# Patient Record
Sex: Female | Born: 1945 | Race: Black or African American | Hispanic: No | Marital: Married | Smoking: Never smoker
Health system: Southern US, Community
[De-identification: ages and names within clinical notes are randomized; demographics above are authoritative.]

## PROBLEM LIST (undated history)

## (undated) DIAGNOSIS — I2510 Atherosclerotic heart disease of native coronary artery without angina pectoris: Secondary | ICD-10-CM

## (undated) DIAGNOSIS — I10 Essential (primary) hypertension: Secondary | ICD-10-CM

## (undated) DIAGNOSIS — H409 Unspecified glaucoma: Secondary | ICD-10-CM

## (undated) DIAGNOSIS — E119 Type 2 diabetes mellitus without complications: Secondary | ICD-10-CM

## (undated) DIAGNOSIS — Z8619 Personal history of other infectious and parasitic diseases: Secondary | ICD-10-CM

## (undated) HISTORY — PX: TUBAL LIGATION: SHX77

## (undated) HISTORY — PX: CATARACT EXTRACTION, BILATERAL: SHX1313

## (undated) HISTORY — DX: Personal history of other infectious and parasitic diseases: Z86.19

## (undated) HISTORY — PX: CARDIAC CATHETERIZATION: SHX172

## (undated) HISTORY — PX: COLONOSCOPY: SHX174

## (undated) HISTORY — DX: Type 2 diabetes mellitus without complications: E11.9

## (undated) HISTORY — DX: Unspecified glaucoma: H40.9

## (undated) HISTORY — DX: Essential (primary) hypertension: I10

---

## 2000-07-05 ENCOUNTER — Encounter: Admission: RE | Admit: 2000-07-05 | Discharge: 2000-07-05 | Payer: Self-pay | Admitting: Internal Medicine

## 2000-07-05 ENCOUNTER — Encounter: Payer: Self-pay | Admitting: Internal Medicine

## 2000-12-27 ENCOUNTER — Emergency Department (HOSPITAL_COMMUNITY): Admission: EM | Admit: 2000-12-27 | Discharge: 2000-12-28 | Payer: Self-pay

## 2001-07-07 ENCOUNTER — Encounter: Payer: Self-pay | Admitting: Internal Medicine

## 2001-07-07 ENCOUNTER — Encounter: Admission: RE | Admit: 2001-07-07 | Discharge: 2001-07-07 | Payer: Self-pay | Admitting: Internal Medicine

## 2001-11-01 ENCOUNTER — Emergency Department (HOSPITAL_COMMUNITY): Admission: EM | Admit: 2001-11-01 | Discharge: 2001-11-02 | Payer: Self-pay | Admitting: Emergency Medicine

## 2002-03-02 ENCOUNTER — Emergency Department (HOSPITAL_COMMUNITY): Admission: EM | Admit: 2002-03-02 | Discharge: 2002-03-02 | Payer: Self-pay | Admitting: Emergency Medicine

## 2002-05-11 ENCOUNTER — Encounter: Payer: Self-pay | Admitting: Internal Medicine

## 2002-05-11 ENCOUNTER — Encounter: Admission: RE | Admit: 2002-05-11 | Discharge: 2002-05-11 | Payer: Self-pay | Admitting: Internal Medicine

## 2003-05-21 ENCOUNTER — Encounter: Payer: Self-pay | Admitting: Internal Medicine

## 2003-05-21 ENCOUNTER — Encounter: Admission: RE | Admit: 2003-05-21 | Discharge: 2003-05-21 | Payer: Self-pay | Admitting: Internal Medicine

## 2006-08-09 ENCOUNTER — Encounter: Admission: RE | Admit: 2006-08-09 | Discharge: 2006-08-09 | Payer: Self-pay | Admitting: Internal Medicine

## 2006-08-29 ENCOUNTER — Encounter: Admission: RE | Admit: 2006-08-29 | Discharge: 2006-08-29 | Payer: Self-pay

## 2009-11-20 ENCOUNTER — Emergency Department (HOSPITAL_COMMUNITY): Admission: EM | Admit: 2009-11-20 | Discharge: 2009-11-20 | Payer: Self-pay | Admitting: Emergency Medicine

## 2010-04-14 ENCOUNTER — Encounter: Admission: RE | Admit: 2010-04-14 | Discharge: 2010-04-14 | Payer: Self-pay | Admitting: Internal Medicine

## 2010-05-17 ENCOUNTER — Encounter: Admission: RE | Admit: 2010-05-17 | Discharge: 2010-05-17 | Payer: Self-pay | Admitting: Internal Medicine

## 2010-11-12 ENCOUNTER — Encounter: Payer: Self-pay | Admitting: Internal Medicine

## 2011-10-09 ENCOUNTER — Ambulatory Visit (INDEPENDENT_AMBULATORY_CARE_PROVIDER_SITE_OTHER): Payer: BC Managed Care – PPO

## 2011-10-09 DIAGNOSIS — R05 Cough: Secondary | ICD-10-CM

## 2011-10-09 DIAGNOSIS — R059 Cough, unspecified: Secondary | ICD-10-CM

## 2011-10-09 DIAGNOSIS — J111 Influenza due to unidentified influenza virus with other respiratory manifestations: Secondary | ICD-10-CM

## 2011-10-09 DIAGNOSIS — R509 Fever, unspecified: Secondary | ICD-10-CM

## 2013-02-13 ENCOUNTER — Encounter (INDEPENDENT_AMBULATORY_CARE_PROVIDER_SITE_OTHER): Payer: BC Managed Care – PPO | Admitting: Ophthalmology

## 2013-02-13 DIAGNOSIS — H2510 Age-related nuclear cataract, unspecified eye: Secondary | ICD-10-CM

## 2013-02-13 DIAGNOSIS — H35039 Hypertensive retinopathy, unspecified eye: Secondary | ICD-10-CM

## 2013-02-13 DIAGNOSIS — E11319 Type 2 diabetes mellitus with unspecified diabetic retinopathy without macular edema: Secondary | ICD-10-CM

## 2013-02-13 DIAGNOSIS — H43819 Vitreous degeneration, unspecified eye: Secondary | ICD-10-CM

## 2013-02-13 DIAGNOSIS — E1139 Type 2 diabetes mellitus with other diabetic ophthalmic complication: Secondary | ICD-10-CM

## 2013-02-13 DIAGNOSIS — I10 Essential (primary) hypertension: Secondary | ICD-10-CM

## 2013-02-13 DIAGNOSIS — E1165 Type 2 diabetes mellitus with hyperglycemia: Secondary | ICD-10-CM

## 2013-03-30 ENCOUNTER — Ambulatory Visit (INDEPENDENT_AMBULATORY_CARE_PROVIDER_SITE_OTHER): Payer: BC Managed Care – PPO | Admitting: Family Medicine

## 2013-03-30 VITALS — BP 169/90 | HR 96 | Temp 99.0°F | Resp 16 | Ht 59.0 in | Wt 101.0 lb

## 2013-03-30 DIAGNOSIS — J209 Acute bronchitis, unspecified: Secondary | ICD-10-CM

## 2013-03-30 DIAGNOSIS — E119 Type 2 diabetes mellitus without complications: Secondary | ICD-10-CM

## 2013-03-30 DIAGNOSIS — J019 Acute sinusitis, unspecified: Secondary | ICD-10-CM

## 2013-03-30 MED ORDER — AZITHROMYCIN 250 MG PO TABS: ORAL_TABLET | ORAL | Status: DC

## 2013-03-30 MED ORDER — PREDNISONE 20 MG PO TABS: ORAL_TABLET | ORAL | Status: DC

## 2013-03-30 MED ORDER — HYDROCODONE-HOMATROPINE 5-1.5 MG/5ML PO SYRP: 5.0000 mL | ORAL_SOLUTION | Freq: Three times a day (TID) | ORAL | Status: DC | PRN

## 2013-03-30 NOTE — Patient Instructions (Addendum)
Sinusitis Sinusitis is redness, soreness, and swelling (inflammation) of the paranasal sinuses. Paranasal sinuses are air pockets within the bones of your face (beneath the eyes, the middle of the forehead, or above the eyes). In healthy paranasal sinuses, mucus is able to drain out, and air is able to circulate through them by way of your nose. However, when your paranasal sinuses are inflamed, mucus and air can become trapped. This can allow bacteria and other germs to grow and cause infection. Sinusitis can develop quickly and last only a short time (acute) or continue over a long period (chronic). Sinusitis that lasts for more than 12 weeks is considered chronic.  CAUSES  Causes of sinusitis include:  Allergies.  Structural abnormalities, such as displacement of the cartilage that separates your nostrils (deviated septum), which can decrease the air flow through your nose and sinuses and affect sinus drainage.  Functional abnormalities, such as when the small hairs (cilia) that line your sinuses and help remove mucus do not work properly or are not present. SYMPTOMS  Symptoms of acute and chronic sinusitis are the same. The primary symptoms are pain and pressure around the affected sinuses. Other symptoms include:  Upper toothache.  Earache.  Headache.  Bad breath.  Decreased sense of smell and taste.  A cough, which worsens when you are lying flat.  Fatigue.  Fever.  Thick drainage from your nose, which often is green and may contain pus (purulent).  Swelling and warmth over the affected sinuses. DIAGNOSIS  Your caregiver will perform a physical exam. During the exam, your caregiver may:  Look in your nose for signs of abnormal growths in your nostrils (nasal polyps).  Tap over the affected sinus to check for signs of infection.  View the inside of your sinuses (endoscopy) with a special imaging device with a light attached (endoscope), which is inserted into your  sinuses. If your caregiver suspects that you have chronic sinusitis, one or more of the following tests may be recommended:  Allergy tests.  Nasal culture A sample of mucus is taken from your nose and sent to a lab and screened for bacteria.  Nasal cytology A sample of mucus is taken from your nose and examined by your caregiver to determine if your sinusitis is related to an allergy. TREATMENT  Most cases of acute sinusitis are related to a viral infection and will resolve on their own within 10 days. Sometimes medicines are prescribed to help relieve symptoms (pain medicine, decongestants, nasal steroid sprays, or saline sprays).  However, for sinusitis related to a bacterial infection, your caregiver will prescribe antibiotic medicines. These are medicines that will help kill the bacteria causing the infection.  Rarely, sinusitis is caused by a fungal infection. In theses cases, your caregiver will prescribe antifungal medicine. For some cases of chronic sinusitis, surgery is needed. Generally, these are cases in which sinusitis recurs more than 3 times per year, despite other treatments. HOME CARE INSTRUCTIONS   Drink plenty of water. Water helps thin the mucus so your sinuses can drain more easily.  Use a humidifier.  Inhale steam 3 to 4 times a day (for example, sit in the bathroom with the shower running).  Apply a warm, moist washcloth to your face 3 to 4 times a day, or as directed by your caregiver.  Use saline nasal sprays to help moisten and clean your sinuses.  Take over-the-counter or prescription medicines for pain, discomfort, or fever only as directed by your caregiver. SEEK IMMEDIATE MEDICAL   or as directed by your caregiver.   Use saline nasal sprays to help moisten and clean your sinuses.   Take over-the-counter or prescription medicines for pain, discomfort, or fever only as directed by your caregiver.  SEEK IMMEDIATE MEDICAL CARE IF:   You have increasing pain or severe headaches.   You have nausea, vomiting, or drowsiness.   You have swelling around your face.   You have vision problems.   You have a stiff neck.   You have difficulty breathing.  MAKE SURE YOU:    Understand these  instructions.   Will watch your condition.   Will get help right away if you are not doing well or get worse.  Document Released: 10/08/2005 Document Revised: 12/31/2011 Document Reviewed: 10/23/2011  [employer] Patient Information 2014 [employer]  Bronchitis  Bronchitis is the body's way of reacting to injury and/or infection (inflammation) of the bronchi. Bronchi are the air tubes that extend from the windpipe into the lungs. If the inflammation becomes severe, it may cause shortness of breath.  CAUSES   Inflammation may be caused by:   A virus.   Germs (bacteria).   Dust.   Allergens.   Pollutants and many other irritants.  The cells lining the bronchial tree are covered with tiny hairs (cilia). These constantly beat upward, away from the lungs, toward the mouth. This keeps the lungs free of pollutants. When these cells become too irritated and are unable to do their job, mucus begins to develop. This causes the characteristic cough of bronchitis. The cough clears the lungs when the cilia are unable to do their job. Without either of these protective mechanisms, the mucus would settle in the lungs. Then you would develop pneumonia.  Smoking is a common cause of bronchitis and can contribute to pneumonia. Stopping this habit is the single most important thing you can do to help yourself.  TREATMENT    Your caregiver may prescribe an antibiotic if the cough is caused by bacteria. Also, medicines that open up your airways make it easier to breathe. Your caregiver may also recommend or prescribe an expectorant. It will loosen the mucus to be coughed up. Only take over-the-counter or prescription medicines for pain, discomfort, or fever as directed by your caregiver.   Removing whatever causes the problem (smoking, for example) is critical to preventing the problem from getting worse.   Cough suppressants may be prescribed for relief of cough symptoms.   Inhaled medicines may be prescribed to help with  symptoms now and to help prevent problems from returning.   For those with recurrent (chronic) bronchitis, there may be a need for steroid medicines.  SEEK IMMEDIATE MEDICAL CARE IF:    During treatment, you develop more pus-like mucus (purulent sputum).   You have a fever.   Your baby is older than 3 months with a rectal temperature of 102 F (38.9 C) or higher.   Your baby is 3 months old or younger with a rectal temperature of 100.4 F (38 C) or higher.   You become progressively more ill.   You have increased difficulty breathing, wheezing, or shortness of breath.  It is necessary to seek immediate medical care if you are elderly or sick from any other disease.  MAKE SURE YOU:    Understand these instructions.   Will watch your condition.   Will get help right away if you are not doing well or get worse.  Document Released: 10/08/2005 Document Revised: 12/31/2011 Document Reviewed: 08/17/2008

## 2013-03-30 NOTE — Progress Notes (Signed)
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    BS last checked was 140  Non smoker.  No h/o asthma  Objective:  NAD HEENT:  Unremarkable (edentulous), mucopurulent nasal discharge is present. Chest:  Diffuse bilateral wheezes Heart:  Reg, no murmur Ext:  No edema  Assessment:  Sinus infection with RAD  Plan:    Acute bronchitis - Plan: predniSONE (DELTASONE) 20 MG tablet, azithromycin (ZITHROMAX Z-PAK) 250 MG tablet, HYDROcodone-homatropine (HYCODAN) 5-1.5 MG/5ML syrup  Sinusitis, acute - Plan: predniSONE (DELTASONE) 20 MG tablet, azithromycin (ZITHROMAX Z-PAK) 250 MG tablet, HYDROcodone-homatropine (HYCODAN) 5-1.5 MG/5ML syrup  Elvina Sidle, MD

## 2013-08-04 ENCOUNTER — Ambulatory Visit: Payer: Medicare Other

## 2013-08-04 ENCOUNTER — Ambulatory Visit (INDEPENDENT_AMBULATORY_CARE_PROVIDER_SITE_OTHER): Payer: Medicare Other | Admitting: Internal Medicine

## 2013-08-04 VITALS — BP 110/62 | HR 80 | Temp 99.5°F | Resp 16 | Ht <= 58 in | Wt 104.0 lb

## 2013-08-04 DIAGNOSIS — M25511 Pain in right shoulder: Secondary | ICD-10-CM

## 2013-08-04 DIAGNOSIS — E785 Hyperlipidemia, unspecified: Secondary | ICD-10-CM

## 2013-08-04 DIAGNOSIS — E119 Type 2 diabetes mellitus without complications: Secondary | ICD-10-CM

## 2013-08-04 DIAGNOSIS — M7500 Adhesive capsulitis of unspecified shoulder: Secondary | ICD-10-CM

## 2013-08-04 DIAGNOSIS — M25519 Pain in unspecified shoulder: Secondary | ICD-10-CM

## 2013-08-04 DIAGNOSIS — M7501 Adhesive capsulitis of right shoulder: Secondary | ICD-10-CM

## 2013-08-04 MED ORDER — HYDROCODONE-ACETAMINOPHEN 10-325 MG PO TABS: 1.0000 | ORAL_TABLET | Freq: Every evening | ORAL | Status: DC | PRN

## 2013-08-04 NOTE — Progress Notes (Signed)
  Subjective:    Patient ID: Mariah Peterson, female    DOB: [birth year], 67 y.o.   MRN: [id number]  HPI A couple days started having stabbing pains in right shoulder. Pain feels deep in bone. This pain prevented her from sleeping last night. No injury. No falls. No repetitive motion. No activity out of the norm. No numbness or tingling in arm or up into neck. No pain into neck. No pain with head movement. No headache.  Blood sugars 40-150. Rarely as low as 40. No significant change with pain.  No fever.  No medication for pain. Heat with minimal relief.   Review of Systems No chest pain, no cough, no SOB. Flu shot last week at regular doctor appointment.    Objective:   Physical Exam  Constitutional: She appears well-developed and well-nourished.  Eyes: Conjunctivae are normal.  Neck: Normal range of motion. Neck supple.  Cardiovascular: Normal rate, regular rhythm and normal heart sounds.   Pulmonary/Chest: Effort normal and breath sounds normal.  Musculoskeletal: She exhibits tenderness. She exhibits no edema.  Wrist, elbow and neck with good ROM. Right radial pulse strong. Right clavicle and scapula nontender to palpation. Right shoulder painful to light touch. Generalized tenderness over entire shoulder. Patient unable to raise right arm above chest level.   Lymphadenopathy:    She has no cervical adenopathy.  [facility] reading (PRIMARY) by  Dr. Merla Peterson neg shoulder.      Assessment & Plan:  Pain in joint, shoulder region, right - Plan: DG Shoulder Right, Ambulatory referral to Physical Therapy  Frozen shoulder, right  Meds ordered this encounter  Medications  . HYDROcodone-acetaminophen (NORCO) 10-325 MG per tablet    Sig: Take 1 tablet by mouth at bedtime as needed for pain.    Dispense:  30 tablet    Refill:  0   1- Norco as above to be used at bedtime as needed. 2- Acetaminophen during the day as needed. 3- Ambulatory referral to PT 4- Heat and ROM exercises as  tolerated.  I have completed the patient encounter in its entirety as documented by the fnp dg, with editing by me where necessary. Mariah Peterson, M.D.

## 2013-08-04 NOTE — Progress Notes (Deleted)
  Subjective:    Patient ID: Mariah Peterson, female    DOB: Jun 11, 1946, 67 y.o.   MRN: [id number]  HPI    Review of Systems     Objective:   Physical Exam        Assessment & Plan:

## 2013-08-12 ENCOUNTER — Ambulatory Visit: Payer: Medicare Other | Attending: Internal Medicine | Admitting: Physical Therapy

## 2013-08-12 DIAGNOSIS — IMO0001 Reserved for inherently not codable concepts without codable children: Secondary | ICD-10-CM | POA: Insufficient documentation

## 2013-08-12 DIAGNOSIS — M25619 Stiffness of unspecified shoulder, not elsewhere classified: Secondary | ICD-10-CM | POA: Insufficient documentation

## 2013-08-12 DIAGNOSIS — M25519 Pain in unspecified shoulder: Secondary | ICD-10-CM | POA: Insufficient documentation

## 2013-08-17 ENCOUNTER — Ambulatory Visit: Payer: Medicare Other | Admitting: Rehabilitation

## 2013-08-19 ENCOUNTER — Ambulatory Visit: Payer: Medicare Other | Admitting: Physical Therapy

## 2013-08-24 ENCOUNTER — Ambulatory Visit: Payer: Medicare Other | Attending: Internal Medicine | Admitting: Physical Therapy

## 2013-08-24 DIAGNOSIS — IMO0001 Reserved for inherently not codable concepts without codable children: Secondary | ICD-10-CM | POA: Insufficient documentation

## 2013-08-24 DIAGNOSIS — M25619 Stiffness of unspecified shoulder, not elsewhere classified: Secondary | ICD-10-CM | POA: Insufficient documentation

## 2013-08-24 DIAGNOSIS — M25519 Pain in unspecified shoulder: Secondary | ICD-10-CM | POA: Insufficient documentation

## 2013-08-26 ENCOUNTER — Ambulatory Visit: Payer: Medicare Other | Admitting: Physical Therapy

## 2013-08-31 ENCOUNTER — Ambulatory Visit: Payer: Medicare Other | Admitting: Rehabilitation

## 2013-09-02 ENCOUNTER — Ambulatory Visit: Payer: Medicare Other | Admitting: Physical Therapy

## 2013-09-07 ENCOUNTER — Ambulatory Visit: Payer: Medicare Other | Admitting: Physical Therapy

## 2013-09-09 ENCOUNTER — Encounter: Payer: Medicare Other | Admitting: Physical Therapy

## 2014-02-13 ENCOUNTER — Encounter (HOSPITAL_COMMUNITY): Payer: Self-pay | Admitting: Emergency Medicine

## 2014-02-13 ENCOUNTER — Emergency Department (HOSPITAL_COMMUNITY)
Admission: EM | Admit: 2014-02-13 | Discharge: 2014-02-13 | Disposition: A | Discharge status: Home / Self Care | Payer: Medicare Other | Attending: Emergency Medicine | Admitting: Emergency Medicine

## 2014-02-13 DIAGNOSIS — M79609 Pain in unspecified limb: Secondary | ICD-10-CM

## 2014-02-13 DIAGNOSIS — E876 Hypokalemia: Secondary | ICD-10-CM

## 2014-02-13 DIAGNOSIS — Z7982 Long term (current) use of aspirin: Secondary | ICD-10-CM | POA: Insufficient documentation

## 2014-02-13 DIAGNOSIS — I10 Essential (primary) hypertension: Secondary | ICD-10-CM | POA: Insufficient documentation

## 2014-02-13 DIAGNOSIS — R252 Cramp and spasm: Secondary | ICD-10-CM | POA: Insufficient documentation

## 2014-02-13 DIAGNOSIS — H409 Unspecified glaucoma: Secondary | ICD-10-CM | POA: Insufficient documentation

## 2014-02-13 DIAGNOSIS — Z79899 Other long term (current) drug therapy: Secondary | ICD-10-CM | POA: Insufficient documentation

## 2014-02-13 DIAGNOSIS — Z794 Long term (current) use of insulin: Secondary | ICD-10-CM | POA: Insufficient documentation

## 2014-02-13 DIAGNOSIS — R739 Hyperglycemia, unspecified: Secondary | ICD-10-CM

## 2014-02-13 DIAGNOSIS — E119 Type 2 diabetes mellitus without complications: Secondary | ICD-10-CM | POA: Insufficient documentation

## 2014-02-13 LAB — URINALYSIS, ROUTINE W REFLEX MICROSCOPIC
Bilirubin Urine: NEGATIVE
HGB URINE DIPSTICK: NEGATIVE
KETONES UR: NEGATIVE mg/dL
Nitrite: NEGATIVE
PROTEIN: NEGATIVE mg/dL
Specific Gravity, Urine: 1.031 — ABNORMAL HIGH (ref 1.005–1.030)
Urobilinogen, UA: 0.2 mg/dL (ref 0.0–1.0)
pH: 5.5 (ref 5.0–8.0)

## 2014-02-13 LAB — URINE MICROSCOPIC-ADD ON

## 2014-02-13 LAB — BASIC METABOLIC PANEL
BUN: 34 mg/dL — AB (ref 6–23)
CALCIUM: 10.8 mg/dL — AB (ref 8.4–10.5)
CO2: 26 meq/L (ref 19–32)
CREATININE: 1.01 mg/dL (ref 0.50–1.10)
Chloride: 96 mEq/L (ref 96–112)
GFR calc Af Amer: 66 mL/min — ABNORMAL LOW (ref >90)
GFR calc non Af Amer: 57 mL/min — ABNORMAL LOW (ref >90)
GLUCOSE: 338 mg/dL — AB (ref 70–99)
Potassium: 3.6 mEq/L — ABNORMAL LOW (ref 3.7–5.3)
Sodium: 138 mEq/L (ref 137–147)

## 2014-02-13 LAB — CBC WITH DIFFERENTIAL/PLATELET
BASOS ABS: 0 10*3/uL (ref 0.0–0.1)
Basophils Relative: 0 % (ref 0–1)
EOS PCT: 1 % (ref 0–5)
Eosinophils Absolute: 0.1 10*3/uL (ref 0.0–0.7)
HEMATOCRIT: 38.7 % (ref 36.0–46.0)
Hemoglobin: 13.7 g/dL (ref 12.0–15.0)
LYMPHS ABS: 2.6 10*3/uL (ref 0.7–4.0)
LYMPHS PCT: 34 % (ref 12–46)
MCH: 30.3 pg (ref 26.0–34.0)
MCHC: 35.4 g/dL (ref 30.0–36.0)
MCV: 85.6 fL (ref 78.0–100.0)
MONO ABS: 0.6 10*3/uL (ref 0.1–1.0)
Monocytes Relative: 8 % (ref 3–12)
Neutro Abs: 4.2 10*3/uL (ref 1.7–7.7)
Neutrophils Relative %: 57 % (ref 43–77)
Platelets: 220 10*3/uL (ref 150–400)
RBC: 4.52 MIL/uL (ref 3.87–5.11)
RDW: 12.2 % (ref 11.5–15.5)
WBC: 7.5 10*3/uL (ref 4.0–10.5)

## 2014-02-13 LAB — CBG MONITORING, ED: Glucose-Capillary: 171 mg/dL — ABNORMAL HIGH (ref 70–99)

## 2014-02-13 MED ORDER — SODIUM CHLORIDE 0.9 % IV SOLN
1000.0000 mL | Freq: Once | INTRAVENOUS | Status: AC
  Administered 2014-02-13: 1000 mL via INTRAVENOUS

## 2014-02-13 MED ORDER — MORPHINE SULFATE 4 MG/ML IJ SOLN
4.0000 mg | Freq: Once | INTRAMUSCULAR | Status: AC
  Administered 2014-02-13: 4 mg via INTRAVENOUS
  Filled 2014-02-13: qty 1

## 2014-02-13 MED ORDER — SODIUM CHLORIDE 0.9 % IV SOLN: 1000.0000 mL | INTRAVENOUS | Status: DC

## 2014-02-13 MED ORDER — POTASSIUM CHLORIDE 10 MEQ/100ML IV SOLN
10.0000 meq | Freq: Once | INTRAVENOUS | Status: AC
  Administered 2014-02-13: 10 meq via INTRAVENOUS
  Filled 2014-02-13: qty 100

## 2014-02-13 NOTE — Progress Notes (Signed)
VASCULAR LAB PRELIMINARY  PRELIMINARY  PRELIMINARY  PRELIMINARY  Bilateral lower extremity venous Dopplers completed.    Preliminary report:  There is no obvious evidence of DVT or SVT noted in the bilateral lower extremities.  There is sluggish flow noted, however, etiology unknown.  Iantha Fallen, RVT 02/13/2014, 8:24 AM

## 2014-02-13 NOTE — ED Provider Notes (Signed)
CSN: [id number]     Arrival date & time 02/13/14  0223 History   First MD Initiated Contact with Patient 02/13/14 [phone]     Chief Complaint  Patient presents with  . Leg Pain     (Consider location/radiation/quality/duration/timing/severity/associated sxs/prior Treatment) HPI Comments: Pt is a 68 y/o female with a PMHx of HTN, glaucoma and DM who presents to the ED complaining of sudden onset bilateral lower leg pain beginning 4 hours PTA when she went to lay down to sleep. Pain begins around her knees and radiates down into her feet described as a constant cramp, worse while walking. States she had a similar episode 3 weeks ago but not as severe. States she feels her toes curling in and cramping at times. Denies ever having symptoms like this before 3 weeks ago. Denies numbness or tingling. No known injury or trauma. Denies swelling. She is diabetic and reports her blood sugars controlled, it is noted her blood pressure was 338 today but she states she had some soda. She has not tried any alleviating factors for her symptoms. Denies history of blood clots. Denies cp, sob, n/v.  Patient is a 68 y.o. female presenting with leg pain. The history is provided by the patient.  Leg Pain   Past Medical History  Diagnosis Date  . Diabetes mellitus without complication   . Glaucoma   . Hypertension    Past Surgical History  Procedure Laterality Date  . Cesarean section    . Eye surgery     No family history on file. History  Substance Use Topics  . Smoking status: Never Smoker   . Smokeless tobacco: Not on file  . Alcohol Use: No   OB History   Grav Para Term Preterm Abortions TAB SAB Ect Mult Living                 Review of Systems  Musculoskeletal:       Positive for bilateral leg pain and cramping.  All other systems reviewed and are negative.     Allergies  Review of patient's allergies indicates no known allergies.  Home Medications   Prior to Admission medications    Medication Sig Start Date End Date Taking? Authorizing Provider  amLODipine (NORVASC) 2.5 MG tablet Take 2.5 mg by mouth daily.   Yes Historical Provider, MD  aspirin EC 81 MG tablet Take 81 mg by mouth daily.   Yes Historical Provider, MD  chlorthalidone (HYGROTON) 25 MG tablet Take 25 mg by mouth daily.   Yes Historical Provider, MD  glipiZIDE (GLUCOTROL) 10 MG tablet Take 10 mg by mouth 2 (two) times daily before a meal.   Yes Historical Provider, MD  insulin aspart (NOVOLOG) 100 UNIT/ML injection Inject 20 Units into the skin 3 (three) times daily before meals.   Yes Historical Provider, MD  insulin glargine (LANTUS) 100 UNIT/ML injection Inject 36 Units into the skin 2 (two) times daily.   Yes Historical Provider, MD  lisinopril (PRINIVIL,ZESTRIL) 40 MG tablet Take 40 mg by mouth daily.   Yes Historical Provider, MD  metFORMIN (GLUCOPHAGE) 500 MG tablet Take 500 mg by mouth 2 (two) times daily with a meal.   Yes Historical Provider, MD  simvastatin (ZOCOR) 80 MG tablet Take 80 mg by mouth daily.   Yes Historical Provider, MD  Travoprost, BAK Free, (TRAVATAN) 0.004 % SOLN ophthalmic solution Place 1 drop into both eyes at bedtime.    Yes Historical Provider, MD   BP 127/65  Pulse 74  Temp(Src) 98.3 F (36.8 C) (Oral)  Resp 16  Ht 4\' 11"  (1.499 m)  Wt 98 lb 8 oz (44.679 kg)  BMI 19.88 kg/m2  SpO2 97% Physical Exam  Nursing note and vitals reviewed. Constitutional: She is oriented to person, place, and time. She appears well-developed and well-nourished. No distress.  HENT:  Head: Normocephalic and atraumatic.  Mouth/Throat: Oropharynx is clear and moist.  Eyes: Conjunctivae are normal.  Neck: Normal range of motion. Neck supple.  Cardiovascular: Normal rate, regular rhythm and normal heart sounds.   +2 PT/DP pulses BL.  Pulmonary/Chest: Effort normal and breath sounds normal.  Abdominal: Soft. Bowel sounds are normal. There is no tenderness.  Musculoskeletal: Normal range of  motion. She exhibits no edema.  TTP bilateral lower legs diffusely. No erythema, edema or warmth.  Neurological: She is alert and oriented to person, place, and time.  No sensory deficit. Slow gait, painful to walk.  Skin: Skin is warm and dry. She is not diaphoretic. No erythema.  Psychiatric: She has a normal mood and affect. Her behavior is normal.    ED Course  Procedures (including critical care time) Labs Review Labs Reviewed  BASIC METABOLIC PANEL - Abnormal; Notable for the following:    Potassium 3.6 (*)    Glucose, Bld 338 (*)    BUN 34 (*)    Calcium 10.8 (*)    GFR calc non Af Amer 57 (*)    GFR calc Af Amer 66 (*)    All other components within normal limits  URINALYSIS, ROUTINE W REFLEX MICROSCOPIC - Abnormal; Notable for the following:    Specific Gravity, Urine 1.031 (*)    Glucose, UA >1000 (*)    Leukocytes, UA SMALL (*)    All other components within normal limits  CBG MONITORING, ED - Abnormal; Notable for the following:    Glucose-Capillary 171 (*)    All other components within normal limits  CBC WITH DIFFERENTIAL  URINE MICROSCOPIC-ADD ON    Imaging Review No results found. VASCULAR LAB  PRELIMINARY PRELIMINARY PRELIMINARY PRELIMINARY  Bilateral lower extremity venous Dopplers completed.  Preliminary report: There is no obvious evidence of DVT or SVT noted in the bilateral lower extremities. There is sluggish flow noted, however, etiology unknown.  Iantha Fallen, RVT  02/13/2014, 8:24 AM   EKG Interpretation None      MDM   Final diagnoses:  Hypokalemia  Hyperglycemia  Leg cramps   Pt presenting with bilateral lower leg pain and cramping. Neurovascularly intact. Labs obtained prior to pt being seen. BS elevated today at 338. Ca slightly elevated at 10.8, and hypokalemia 3.6 which could relate to cramping sensation. Less likely diabetic neuropathy. Plan to give IV fluids and potassium. Will obtain bilateral LE venous duplex to r/o  DVT. 8:47 AM Lower extremity venous duplex negative for DVT. After receiving IV fluids, glucose 171. Patient reports her symptoms have resolved. Her legs bilaterally are nontender, states the cramping sensation has subsided.stable for d/c. I advised her to f/u with PCP and keep close checks on her glucose. Stable for d/c.   Case discussed with attending Dr. Roxanne Mins who also evaluated patient and agrees with plan of care.   Illene Labrador, PA-C 02/13/14 [phone]

## 2014-02-13 NOTE — ED Notes (Signed)
Pt reports she has been experiencing bilateral calf and foot cramps that "curl my feet back" x 2 hours.  When she was asked about her blood sugar being elevated, pt stated "oh, I know why that is, I had some sodas."  Denies any other complaints.

## 2014-02-13 NOTE — ED Provider Notes (Signed)
68 year old female who is diabetic has had bilateral leg pain for about the last week which is getting worse. At times, her legs cramp to the point where her toes wall. Pain is significantly worse last night. There is no history of trauma. There is no numbness or tingling or burning. On exam, she does have tenderness rather diffusely through legs with no swelling or erythema or warmth. Dorsalis pedis pulses are 2+ and strong. Capillary refill is prompt. There is no numbness and no evidence of neuropathy. She states that her blood sugars have been running fairly normal at home but blood sugar is elevated over 300 today. She is also noted to have borderline hypokalemia and elevated BUN relative to creatinine. She will be given IV hydration and IV potassium and she will also need to be sent for venous Doppler to rule out DVT.  Medical screening examination/treatment/procedure(s) were conducted as a shared visit with non-physician practitioner(s) and myself.  I personally evaluated the patient during the encounter.   Delora Fuel, MD 45/[phone]

## 2014-02-13 NOTE — Discharge Instructions (Signed)
Hyperglycemia °Hyperglycemia occurs when the glucose (sugar) in your blood is too high. Hyperglycemia can happen for many reasons, but it most often happens to people who do not know they have diabetes or are not managing their diabetes properly.  °CAUSES  °Whether you have diabetes or not, there are other causes of hyperglycemia. Hyperglycemia can occur when you have diabetes, but it can also occur in other situations that you might not be as aware of, such as: °Diabetes °· If you have diabetes and are having problems controlling your blood glucose, hyperglycemia could occur because of some of the following reasons: °· Not following your meal plan. °· Not taking your diabetes medications or not taking it properly. °· Exercising less or doing less activity than you normally do. °· Being sick. °Pre-diabetes °· This cannot be ignored. Before people develop Type 2 diabetes, they almost always have "pre-diabetes." This is when your blood glucose levels are higher than normal, but not yet high enough to be diagnosed as diabetes. Research has shown that some long-term damage to the body, especially the heart and circulatory system, may already be occurring during pre-diabetes. If you take action to manage your blood glucose when you have pre-diabetes, you may delay or prevent Type 2 diabetes from developing. °Stress °· If you have diabetes, you may be "diet" controlled or on oral medications or insulin to control your diabetes. However, you may find that your blood glucose is higher than usual in the hospital whether you have diabetes or not. This is often referred to as "stress hyperglycemia." Stress can elevate your blood glucose. This happens because of hormones put out by the body during times of stress. If stress has been the cause of your high blood glucose, it can be followed regularly by your caregiver. That way he/she can make sure your hyperglycemia does not continue to get worse or progress to  diabetes. °Steroids °· Steroids are medications that act on the infection fighting system (immune system) to block inflammation or infection. One side effect can be a rise in blood glucose. Most people can produce enough extra insulin to allow for this rise, but for those who cannot, steroids make blood glucose levels go even higher. It is not unusual for steroid treatments to "uncover" diabetes that is developing. It is not always possible to determine if the hyperglycemia will go away after the steroids are stopped. A special blood test called an A1c is sometimes done to determine if your blood glucose was elevated before the steroids were started. °SYMPTOMS °· Thirsty. °· Frequent urination. °· Dry mouth. °· Blurred vision. °· Tired or fatigue. °· Weakness. °· Sleepy. °· Tingling in feet or leg. °DIAGNOSIS  °Diagnosis is made by monitoring blood glucose in one or all of the following ways: °· A1c test. This is a chemical found in your blood. °· Fingerstick blood glucose monitoring. °· Laboratory results. °TREATMENT  °First, knowing the cause of the hyperglycemia is important before the hyperglycemia can be treated. Treatment may include, but is not be limited to: °· Education. °· Change or adjustment in medications. °· Change or adjustment in meal plan. °· Treatment for an illness, infection, etc. °· More frequent blood glucose monitoring. °· Change in exercise plan. °· Decreasing or stopping steroids. °· Lifestyle changes. °HOME CARE INSTRUCTIONS  °· Test your blood glucose as directed. °· Exercise regularly. Your caregiver will give you instructions about exercise. Pre-diabetes or diabetes which comes on with stress is helped by exercising. °· Eat wholesome,   balanced meals. Eat often and at regular, fixed times. Your caregiver or nutritionist will give you a meal plan to guide your sugar intake.  Being at an ideal weight is important. If needed, losing as little as 10 to 15 pounds may help improve blood  glucose levels. SEEK MEDICAL CARE IF:   You have questions about medicine, activity, or diet.  You continue to have symptoms (problems such as increased thirst, urination, or weight gain). SEEK IMMEDIATE MEDICAL CARE IF:   You are vomiting or have diarrhea.  Your breath smells fruity.  You are breathing faster or slower.  You are very sleepy or incoherent.  You have numbness, tingling, or pain in your feet or hands.  You have chest pain.  Your symptoms get worse even though you have been following your caregiver's orders.  If you have any other questions or concerns. Document Released: 04/03/2001 Document Revised: 12/31/2011 Document Reviewed: 02/04/2012 [facility] Patient Information 2014 [place], [place].  Muscle Cramps and Spasms Muscle cramps and spasms occur when a muscle or muscles tighten and you have no control over this tightening (involuntary muscle contraction). They are a common problem and can develop in any muscle. The most common place is in the calf muscles of the leg. Both muscle cramps and muscle spasms are involuntary muscle contractions, but they also have differences:   Muscle cramps are sporadic and painful. They may last a few seconds to a quarter of an hour. Muscle cramps are often more forceful and last longer than muscle spasms.  Muscle spasms may or may not be painful. They may also last just a few seconds or much longer. CAUSES  It is uncommon for cramps or spasms to be due to a serious underlying problem. In many cases, the cause of cramps or spasms is unknown. Some common causes are:   Overexertion.   Overuse from repetitive motions (doing the same thing over and over).   Remaining in a certain position for a long period of time.   Improper preparation, form, or technique while performing a sport or activity.   Dehydration.   Injury.   Side effects of some medicines.   Abnormally low levels of the salts and ions in your blood  (electrolytes), especially potassium and calcium. This could happen if you are taking water pills (diuretics) or you are pregnant.  Some underlying medical problems can make it more likely to develop cramps or spasms. These include, but are not limited to:   Diabetes.   Parkinson disease.   Hormone disorders, such as thyroid problems.   Alcohol abuse.   Diseases specific to muscles, joints, and bones.   Blood vessel disease where not enough blood is getting to the muscles.  HOME CARE INSTRUCTIONS   Stay well hydrated. Drink enough water and fluids to keep your urine clear or pale yellow.  It may be helpful to massage, stretch, and relax the affected muscle.  For tight or tense muscles, use a warm towel, heating pad, or hot shower water directed to the affected area.  If you are sore or have pain after a cramp or spasm, applying ice to the affected area may relieve discomfort.  Put ice in a plastic bag.  Place a towel between your skin and the bag.  Leave the ice on for 15-20 minutes, 03-04 times a day.  Medicines used to treat a known cause of cramps or spasms may help reduce their frequency or severity. Only take over-the-counter or prescription medicines as directed  by your caregiver. SEEK MEDICAL CARE IF:  Your cramps or spasms get more severe, more frequent, or do not improve over time.  MAKE SURE YOU:   Understand these instructions.  Will watch your condition.  Will get help right away if you are not doing well or get worse. Document Released: 03/30/2002 Document Revised: 02/02/2013 Document Reviewed: 09/24/2012 [facility] Patient Information 2014 [place], [place].  Hypokalemia Hypokalemia means that the amount of potassium in the blood is lower than normal.Potassium is a chemical, called an electrolyte, that helps regulate the amount of fluid in the body. It also stimulates muscle contraction and helps nerves function properly.Most of the body's potassium is  inside of cells, and only a very small amount is in the blood. Because the amount in the blood is so small, minor changes can be life-threatening. CAUSES  Antibiotics.  Diarrhea or vomiting.  Using laxatives too much, which can cause diarrhea.  Chronic kidney disease.  Water pills (diuretics).  Eating disorders (bulimia).  Low magnesium level.  Sweating a lot. SIGNS AND SYMPTOMS  Weakness.  Constipation.  Fatigue.  Muscle cramps.  Mental confusion.  Skipped heartbeats or irregular heartbeat (palpitations).  Tingling or numbness. DIAGNOSIS  Your health care provider can diagnose hypokalemia with blood tests. In addition to checking your potassium level, your health care provider may also check other lab tests. TREATMENT Hypokalemia can be treated with potassium supplements taken by mouth or adjustments in your current medicines. If your potassium level is very low, you may need to get potassium through a vein (IV) and be monitored in the hospital. A diet high in potassium is also helpful. Foods high in potassium are:  Nuts, such as peanuts and pistachios.  Seeds, such as sunflower seeds and pumpkin seeds.  Peas, lentils, and lima beans.  Whole grain and bran cereals and breads.  Fresh fruit and vegetables, such as apricots, avocado, bananas, cantaloupe, kiwi, oranges, tomatoes, asparagus, and potatoes.  Orange and tomato juices.  Red meats.  Fruit yogurt. HOME CARE INSTRUCTIONS  Take all medicines as prescribed by your health care provider.  Maintain a healthy diet by including nutritious food, such as fruits, vegetables, nuts, whole grains, and lean meats.  If you are taking a laxative, be sure to follow the directions on the label. SEEK MEDICAL CARE IF:  Your weakness gets worse.  You feel your heart pounding or racing.  You are vomiting or having diarrhea.  You are diabetic and having trouble keeping your blood glucose in the normal range. SEEK  IMMEDIATE MEDICAL CARE IF:  You have chest pain, shortness of breath, or dizziness.  You are vomiting or having diarrhea for more than 2 days.  You faint. MAKE SURE YOU:   Understand these instructions.  Will watch your condition.  Will get help right away if you are not doing well or get worse. Document Released: 10/08/2005 Document Revised: 07/29/2013 Document Reviewed: 04/10/2013 [facility] Patient Information 2014 [employer].

## 2014-02-13 NOTE — ED Notes (Signed)
The pt is c/p bi-lateral leg pain from the knees down to her feet for 3-4 hours.  The pain started when she lay down to sleep.  No previous history

## 2014-02-24 ENCOUNTER — Ambulatory Visit (INDEPENDENT_AMBULATORY_CARE_PROVIDER_SITE_OTHER): Payer: Medicare Other | Admitting: Ophthalmology

## 2014-02-24 DIAGNOSIS — H43819 Vitreous degeneration, unspecified eye: Secondary | ICD-10-CM

## 2014-02-24 DIAGNOSIS — H35039 Hypertensive retinopathy, unspecified eye: Secondary | ICD-10-CM

## 2014-02-24 DIAGNOSIS — H11009 Unspecified pterygium of unspecified eye: Secondary | ICD-10-CM

## 2014-02-24 DIAGNOSIS — H2510 Age-related nuclear cataract, unspecified eye: Secondary | ICD-10-CM

## 2014-02-24 DIAGNOSIS — I10 Essential (primary) hypertension: Secondary | ICD-10-CM

## 2014-02-24 DIAGNOSIS — E11319 Type 2 diabetes mellitus with unspecified diabetic retinopathy without macular edema: Secondary | ICD-10-CM

## 2014-02-24 DIAGNOSIS — E1165 Type 2 diabetes mellitus with hyperglycemia: Secondary | ICD-10-CM

## 2014-02-24 DIAGNOSIS — E1139 Type 2 diabetes mellitus with other diabetic ophthalmic complication: Secondary | ICD-10-CM

## 2014-03-22 ENCOUNTER — Ambulatory Visit (INDEPENDENT_AMBULATORY_CARE_PROVIDER_SITE_OTHER): Payer: Medicare Other | Admitting: Emergency Medicine

## 2014-03-22 VITALS — BP 130/80 | HR 71 | Temp 98.3°F | Resp 16 | Ht <= 58 in | Wt 93.4 lb

## 2014-03-22 DIAGNOSIS — E785 Hyperlipidemia, unspecified: Secondary | ICD-10-CM

## 2014-03-22 DIAGNOSIS — E119 Type 2 diabetes mellitus without complications: Secondary | ICD-10-CM

## 2014-03-22 DIAGNOSIS — N898 Other specified noninflammatory disorders of vagina: Secondary | ICD-10-CM

## 2014-03-22 DIAGNOSIS — I10 Essential (primary) hypertension: Secondary | ICD-10-CM

## 2014-03-22 DIAGNOSIS — N3000 Acute cystitis without hematuria: Secondary | ICD-10-CM

## 2014-03-22 LAB — POCT WET PREP WITH KOH
Clue Cells Wet Prep HPF POC: NEGATIVE
KOH Prep POC: NEGATIVE
Trichomonas, UA: NEGATIVE
Yeast Wet Prep HPF POC: NEGATIVE

## 2014-03-22 LAB — POCT URINALYSIS DIPSTICK
BILIRUBIN UA: NEGATIVE
Blood, UA: NEGATIVE
Glucose, UA: >=1000
KETONES UA: NEGATIVE
NITRITE UA: NEGATIVE
PROTEIN UA: NEGATIVE
Spec Grav, UA: 1.015
Urobilinogen, UA: 0.2
pH, UA: 5

## 2014-03-22 LAB — POCT UA - MICROSCOPIC ONLY
CRYSTALS, UR, HPF, POC: NEGATIVE
Casts, Ur, LPF, POC: NEGATIVE
Mucus, UA: NEGATIVE
YEAST UA: NEGATIVE

## 2014-03-22 LAB — GLUCOSE, POCT (MANUAL RESULT ENTRY): POC GLUCOSE: 261 mg/dL — AB (ref 70–99)

## 2014-03-22 MED ORDER — NITROFURANTOIN MONOHYD MACRO 100 MG PO CAPS: 100.0000 mg | ORAL_CAPSULE | Freq: Two times a day (BID) | ORAL | Status: DC

## 2014-03-22 NOTE — Patient Instructions (Signed)
Blood Glucose Monitoring, Adult  Monitoring your blood glucose (also know as blood sugar) helps you to manage your diabetes. It also helps you and your health care provider monitor your diabetes and determine how well your treatment plan is working.  WHY SHOULD YOU MONITOR YOUR BLOOD GLUCOSE?  · It can help you understand how food, exercise, and medicine affect your blood glucose.  · It allows you to know what your blood glucose is at any given moment. You can quickly tell if you are having low blood glucose (hypoglycemia) or high blood glucose (hyperglycemia).  · It can help you and your health care provider know how to adjust your medicines.  · It can help you understand how to manage an illness or adjust medicine for exercise.  WHEN SHOULD YOU TEST?  Your health care provider will help you decide how often you should check your blood glucose. This may depend on the type of diabetes you have, your diabetes control, or the types of medicines you are taking. Be sure to write down all of your blood glucose readings so that this information can be reviewed with your health care provider. See below for examples of testing times that your health care provider may suggest.  Type 1 Diabetes  · Test 4 times a day if you are in good control, using an insulin pump, or perform multiple daily injections.  · If your diabetes is not well-controlled or if you are sick, you may need to monitor more often.  · It is a good idea to also monitor:  · Before and after exercise.  · Between meals and 2 hours after a meal.  · Occasionally between 2:00 to 3:00 am.  Type 2 Diabetes  · It can vary with each person, but generally, if you are on insulin, test 4 times a day.  · If you take medicines by mouth (orally), test 2 times a day.  · If you are on a controlled diet, test once a day.  · If your diabetes is not well controlled or if you are sick, you may need to monitor more often.  HOW TO MONITOR YOUR BLOOD GLUCOSE  Supplies  Needed  · Blood glucose meter.  · Test strips for your meter. Each meter has its own strips. You must use the strips that go with your own meter.  · A pricking needle (lancet).  · A device that holds the lancet (lancing device).  · A journal or log book to write down your results.  Procedure  · Wash your hands with soap and water. Alcohol is not preferred.  · Prick the side of your finger (not the tip) with the lancet.  · Gently milk the finger until a small drop of blood appears.  · Follow the instructions that come with your meter for inserting the test strip, applying blood to the strip, and using your blood glucose meter.  Other Areas to Get Blood for Testing  Some meters allow you to use other areas of your body (other than your finger) to test your blood. These areas are called alternative sites. The most common alternative sites are:  · The forearm.  · The thigh.  · The back area of the lower leg.  · The palm of the hand.  The blood flow in these areas is slower. Therefore, the blood glucose values you get may be delayed, and the numbers are different from what you would get from your fingers. Do not use alternative   sites if you think you are having hypoglycemia. Your reading will not be accurate. Always use a finger if you are having hypoglycemia. Also, if you cannot feel your lows (hypoglycemia unawareness), always use your fingers for your blood glucose checks.  ADDITIONAL TIPS FOR GLUCOSE MONITORING  · Do not reuse lancets.  · Always carry your supplies with you.  · All blood glucose meters have a 24-hour "hotline" number to call if you have questions or need help.  · Adjust (calibrate) your blood glucose meter with a control solution after finishing a few boxes of strips.  BLOOD GLUCOSE RECORD KEEPING  It is a good idea to keep a daily record or log of your blood glucose readings. Most glucose meters, if not all, keep your glucose records stored in the meter. Some meters come with the ability to download  your records to your home computer. Keeping a record of your blood glucose readings is especially helpful if you are wanting to look for patterns. Make notes to go along with the blood glucose readings because you might forget what happened at that exact time. Keeping good records helps you and your health care provider to work together to achieve good diabetes management.   Document Released: 10/11/2003 Document Revised: 06/10/2013 Document Reviewed: 03/02/2013  [employer]® Patient Information ©2014 [employer].

## 2014-03-22 NOTE — Progress Notes (Signed)
[facility] [address] [place] [address] [phone]  Date:  03/22/2014   Name:  Mariah Peterson   DOB:  [birth year]   MRN:  [id number]  PCP:  Myriam Jacobson, MD    Chief Complaint: Dysuria and Vaginal Itching   History of Present Illness:  Mariah Peterson is a 68 y.o. very pleasant female patient who presents with the following:  Has dysuria and frequency.  No vaginal discharge, fever or chills.  Not very compliant with diet and glucose monitoring.  No nausea or vomiting.    Patient Active Problem List   Diagnosis Date Noted  . Other and unspecified hyperlipidemia 08/04/2013  . Type II or unspecified type diabetes mellitus without mention of complication, not stated as uncontrolled 08/04/2013    Past Medical History  Diagnosis Date  . Diabetes mellitus without complication   . Glaucoma   . Hypertension     Past Surgical History  Procedure Laterality Date  . Cesarean section    . Eye surgery      History  Substance Use Topics  . Smoking status: Never Smoker   . Smokeless tobacco: Not on file  . Alcohol Use: No    History reviewed. No pertinent family history.  No Known Allergies  Medication list has been reviewed and updated.  Current Outpatient Prescriptions on File Prior to Visit  Medication Sig Dispense Refill  . amLODipine (NORVASC) 2.5 MG tablet Take 2.5 mg by mouth daily.      Marland Kitchen aspirin EC 81 MG tablet Take 81 mg by mouth daily.      . chlorthalidone (HYGROTON) 25 MG tablet Take 25 mg by mouth daily.      Marland Kitchen glipiZIDE (GLUCOTROL) 10 MG tablet Take 10 mg by mouth 2 (two) times daily before a meal.      . insulin aspart (NOVOLOG) 100 UNIT/ML injection Inject 20 Units into the skin 3 (three) times daily before meals.      . insulin glargine (LANTUS) 100 UNIT/ML injection Inject 36 Units into the skin 2 (two) times daily.      Marland Kitchen lisinopril (PRINIVIL,ZESTRIL) 40 MG tablet Take 40 mg by mouth daily.      . metFORMIN (GLUCOPHAGE)  500 MG tablet Take 500 mg by mouth 2 (two) times daily with a meal.      . simvastatin (ZOCOR) 80 MG tablet Take 80 mg by mouth daily.      . Travoprost, BAK Free, (TRAVATAN) 0.004 % SOLN ophthalmic solution Place 1 drop into both eyes at bedtime.        No current facility-administered medications on file prior to visit.    Review of Systems: As per HPI, otherwise negative.    Physical Examination: Filed Vitals:   03/22/14 1012  BP: 130/80  Pulse: 71  Temp: 98.3 F (36.8 C)  Resp: 16   Filed Vitals:   03/22/14 1012  Height: 4\' 8"  (1.422 m)  Weight: 93 lb 6.4 oz (42.366 kg)   Body mass index is 20.95 kg/(m^2). Ideal Body Weight: Weight in (lb) to have BMI = 25: 111.3  GEN: WDWN, NAD, Non-toxic, A & O x 3 HEENT: Atraumatic, Normocephalic. Neck supple. No masses, No LAD. Ears and Nose: No external deformity. CV: RRR, No M/G/R. No JVD. No thrill. No extra heart sounds. PULM: CTA B, no wheezes, crackles, rhonchi. No retractions. No resp. distress. No accessory muscle use. ABD: S, NT, ND, +BS. No rebound. No HSM. EXTR:  No c/c/e NEURO Normal gait.  PSYCH: Normally interactive. Conversant. Not depressed or anxious appearing.  Calm demeanor.    Assessment and Plan: NIDDM noncompliance Cystitis  Signed,  Ellison Carwin, MD   Results for orders placed in visit on 03/22/14  POCT UA - MICROSCOPIC ONLY      Result Value Ref Range   WBC, Ur, HPF, POC 12-18     RBC, urine, microscopic 0-3     Bacteria, U Microscopic trace     Mucus, UA neg     Epithelial cells, urine per micros 1-5     Crystals, Ur, HPF, POC neg     Casts, Ur, LPF, POC neg     Yeast, UA neg    POCT URINALYSIS DIPSTICK      Result Value Ref Range   Color, UA yellow     Clarity, UA clear     Glucose, UA >=1000     Bilirubin, UA neg     Ketones, UA neg     Spec Grav, UA 1.015     Blood, UA neg     pH, UA 5.0     Protein, UA neg     Urobilinogen, UA 0.2     Nitrite, UA neg     Leukocytes, UA  Trace    POCT WET PREP WITH KOH      Result Value Ref Range   Trichomonas, UA Negative     Clue Cells Wet Prep HPF POC neg     Epithelial Wet Prep HPF POC 0-8     Yeast Wet Prep HPF POC neg     Bacteria Wet Prep HPF POC 1+     RBC Wet Prep HPF POC 0-4     WBC Wet Prep HPF POC 0-2     KOH Prep POC Negative

## 2015-03-14 ENCOUNTER — Ambulatory Visit (INDEPENDENT_AMBULATORY_CARE_PROVIDER_SITE_OTHER): Payer: Medicare Other | Admitting: Ophthalmology

## 2015-03-14 DIAGNOSIS — H43813 Vitreous degeneration, bilateral: Secondary | ICD-10-CM

## 2015-03-14 DIAGNOSIS — E11329 Type 2 diabetes mellitus with mild nonproliferative diabetic retinopathy without macular edema: Secondary | ICD-10-CM | POA: Diagnosis not present

## 2015-03-14 DIAGNOSIS — E11319 Type 2 diabetes mellitus with unspecified diabetic retinopathy without macular edema: Secondary | ICD-10-CM

## 2015-03-14 DIAGNOSIS — H35033 Hypertensive retinopathy, bilateral: Secondary | ICD-10-CM

## 2015-03-14 DIAGNOSIS — I10 Essential (primary) hypertension: Secondary | ICD-10-CM

## 2015-05-01 ENCOUNTER — Encounter (HOSPITAL_COMMUNITY): Payer: Self-pay | Admitting: *Deleted

## 2015-05-01 ENCOUNTER — Emergency Department (INDEPENDENT_AMBULATORY_CARE_PROVIDER_SITE_OTHER)
Admission: EM | Admit: 2015-05-01 | Discharge: 2015-05-01 | Disposition: A | Discharge status: Home / Self Care | Payer: Medicare Other | Source: Home / Self Care | Attending: Emergency Medicine | Admitting: Emergency Medicine

## 2015-05-01 DIAGNOSIS — T63441A Toxic effect of venom of bees, accidental (unintentional), initial encounter: Secondary | ICD-10-CM | POA: Diagnosis not present

## 2015-05-01 NOTE — Discharge Instructions (Signed)

## 2015-05-01 NOTE — ED Notes (Signed)
Pt  Was bitten  By   A  Bee     And  Developed  Pain and   Swelling  Of the  r  Hand     No  Systemic  Reaction  She  Is  Sitting  Upright   On the  Exam table  Speaking in  Complete  sentances  Skin is warm  And  Dry  Pt    Appears  In no  Acute  Distress

## 2015-05-02 NOTE — ED Provider Notes (Signed)
CSN: [id number]     Arrival date & time 05/01/15  1839 History   First MD Initiated Contact with Patient 05/01/15 2030     Chief Complaint  Patient presents with  . Insect Bite     (Consider location/radiation/quality/duration/timing/severity/associated sxs/prior Treatment) Patient is a 69 y.o. female presenting with hand injury. The history is provided by the patient. No language interpreter was used.  Hand Injury Location:  Hand Time since incident:  3 hours Injury: yes   Hand location:  R hand Pain details:    Quality:  Aching   Severity:  Moderate   Onset quality:  Gradual   Duration:  3 hours   Timing:  Constant   Progression:  Worsening Chronicity:  New Handedness:  Right-handed Dislocation: no   Foreign body present:  No foreign bodies Prior injury to area:  No Relieved by:  Nothing Worsened by:  Nothing tried Ineffective treatments:  None tried Associated symptoms: no numbness   Pt was stung on her right hand by a bee  Past Medical History  Diagnosis Date  . Diabetes mellitus without complication   . Glaucoma   . Hypertension    Past Surgical History  Procedure Laterality Date  . Cesarean section    . Eye surgery     History reviewed. No pertinent family history. History  Substance Use Topics  . Smoking status: Never Smoker   . Smokeless tobacco: Not on file  . Alcohol Use: No   OB History    No data available     Review of Systems  All other systems reviewed and are negative.     Allergies  Review of patient's allergies indicates no known allergies.  Home Medications   Prior to Admission medications   Medication Sig Start Date End Date Taking? Authorizing Provider  amLODipine (NORVASC) 2.5 MG tablet Take 2.5 mg by mouth daily.    Historical Provider, MD  aspirin EC 81 MG tablet Take 81 mg by mouth daily.    Historical Provider, MD  chlorthalidone (HYGROTON) 25 MG tablet Take 25 mg by mouth daily.    Historical Provider, MD  glipiZIDE  (GLUCOTROL) 10 MG tablet Take 10 mg by mouth 2 (two) times daily before a meal.    Historical Provider, MD  insulin aspart (NOVOLOG) 100 UNIT/ML injection Inject 20 Units into the skin 3 (three) times daily before meals.    Historical Provider, MD  insulin glargine (LANTUS) 100 UNIT/ML injection Inject 36 Units into the skin 2 (two) times daily.    Historical Provider, MD  lisinopril (PRINIVIL,ZESTRIL) 40 MG tablet Take 40 mg by mouth daily.    Historical Provider, MD  metFORMIN (GLUCOPHAGE) 500 MG tablet Take 500 mg by mouth 2 (two) times daily with a meal.    Historical Provider, MD  nitrofurantoin, macrocrystal-monohydrate, (MACROBID) 100 MG capsule Take 1 capsule (100 mg total) by mouth 2 (two) times daily. 03/22/14   Roselee Culver, MD  potassium chloride (K-DUR) 10 MEQ tablet Take 10 mEq by mouth daily.    Historical Provider, MD  simvastatin (ZOCOR) 80 MG tablet Take 80 mg by mouth daily.    Historical Provider, MD  Travoprost, BAK Free, (TRAVATAN) 0.004 % SOLN ophthalmic solution Place 1 drop into both eyes at bedtime.     Historical Provider, MD   BP 177/83 mmHg  Pulse 75  Temp(Src) 98.9 F (37.2 C) (Oral)  Resp 14  SpO2 99% Physical Exam  Constitutional: She appears well-developed and well-nourished.  HENT:  Head: Normocephalic.  Musculoskeletal: She exhibits tenderness.  Swollen right hand from  nv and ns intact  No stinger in hand   Neurological: She is alert.  Skin: Skin is warm.  Nursing note and vitals reviewed.   ED Course  Procedures (including critical care time) Labs Review Labs Reviewed - No data to display  Imaging Review No results found.   EKG Interpretation None      MDM   Final diagnoses:  Bee sting, accidental or unintentional, initial encounter    Benadryl Ice Return if any problems.    [place], PA-C 05/02/15 1717

## 2015-10-23 HISTORY — PX: BREAST BIOPSY: SHX20

## 2016-03-20 ENCOUNTER — Ambulatory Visit (INDEPENDENT_AMBULATORY_CARE_PROVIDER_SITE_OTHER): Payer: Medicare Other | Admitting: Ophthalmology

## 2016-03-20 DIAGNOSIS — H35033 Hypertensive retinopathy, bilateral: Secondary | ICD-10-CM

## 2016-03-20 DIAGNOSIS — E11319 Type 2 diabetes mellitus with unspecified diabetic retinopathy without macular edema: Secondary | ICD-10-CM | POA: Diagnosis not present

## 2016-03-20 DIAGNOSIS — E113293 Type 2 diabetes mellitus with mild nonproliferative diabetic retinopathy without macular edema, bilateral: Secondary | ICD-10-CM

## 2016-03-20 DIAGNOSIS — I10 Essential (primary) hypertension: Secondary | ICD-10-CM | POA: Diagnosis not present

## 2016-03-20 DIAGNOSIS — H43813 Vitreous degeneration, bilateral: Secondary | ICD-10-CM

## 2016-04-13 ENCOUNTER — Ambulatory Visit (INDEPENDENT_AMBULATORY_CARE_PROVIDER_SITE_OTHER): Payer: Medicare Other | Admitting: Family Medicine

## 2016-04-13 VITALS — BP 148/82 | HR 83 | Temp 98.6°F | Resp 18 | Ht <= 58 in | Wt 94.0 lb

## 2016-04-13 DIAGNOSIS — J069 Acute upper respiratory infection, unspecified: Secondary | ICD-10-CM | POA: Diagnosis not present

## 2016-04-13 DIAGNOSIS — R059 Cough, unspecified: Secondary | ICD-10-CM

## 2016-04-13 DIAGNOSIS — R0789 Other chest pain: Secondary | ICD-10-CM | POA: Diagnosis not present

## 2016-04-13 DIAGNOSIS — R05 Cough: Secondary | ICD-10-CM

## 2016-04-13 MED ORDER — HYDROCODONE-HOMATROPINE 5-1.5 MG/5ML PO SYRP: ORAL_SOLUTION | ORAL | Status: DC

## 2016-04-13 NOTE — Progress Notes (Signed)
By signing my name below, I, Mesha Guinyard, attest that this documentation has been prepared under the direction and in the presence of Merri Ray, MD.  Electronically Signed: Verlee Monte, Medical Scribe. 04/13/2016. 10:44 AM.  Subjective:    Patient ID: Mariah Peterson, female    DOB: [birth year], 70 y.o.   MRN: [id number]  HPI Chief Complaint  Patient presents with  . Cough    x 2 days  - non-productive  . chest pain with cough  . Hoarse    worse at night  . elevated BP at Triage    150/78    HPI Comments: Mariah Peterson is a 70 y.o. female who presents to the [facility] complaining of cough onset 2 nights ago. Started out as congestion, then she preceded to cough. Pt's cough gets worse when she lays down and at night. Pt states the cough keeps her up at night. Pt hasn't been taking medications for her symptoms. Pt denies SOB, fever, and appetite change. Pt denies smoking or PMHx of asthma. Pt denies sick contacts.  Pt denies side affects with hydrocodone.  Patient Active Problem List   Diagnosis Date Noted  . Other and unspecified hyperlipidemia 08/04/2013  . Type II or unspecified type diabetes mellitus without mention of complication, not stated as uncontrolled 08/04/2013   Past Medical History  Diagnosis Date  . Diabetes mellitus without complication ([place])   . Glaucoma   . Hypertension    Past Surgical History  Procedure Laterality Date  . Cesarean section    . Eye surgery     No Known Allergies Prior to Admission medications   Medication Sig Start Date End Date Taking? Authorizing Provider  chlorthalidone (HYGROTON) 25 MG tablet Take 25 mg by mouth daily.   Yes Historical Provider, MD  glipiZIDE (GLUCOTROL) 10 MG tablet Take 10 mg by mouth 2 (two) times daily before a meal.   Yes Historical Provider, MD  insulin aspart (NOVOLOG) 100 UNIT/ML injection Inject 20 Units into the skin 3 (three) times daily before meals.   Yes Historical Provider,  MD  insulin glargine (LANTUS) 100 UNIT/ML injection Inject 36 Units into the skin 2 (two) times daily.   Yes Historical Provider, MD  lisinopril (PRINIVIL,ZESTRIL) 40 MG tablet Take 40 mg by mouth daily.   Yes Historical Provider, MD  metFORMIN (GLUCOPHAGE) 500 MG tablet Take 500 mg by mouth 2 (two) times daily with a meal.   Yes Historical Provider, MD  nitrofurantoin, macrocrystal-monohydrate, (MACROBID) 100 MG capsule Take 1 capsule (100 mg total) by mouth 2 (two) times daily. 03/22/14  Yes Roselee Culver, MD  simvastatin (ZOCOR) 80 MG tablet Take 80 mg by mouth daily.   Yes Historical Provider, MD  Travoprost, BAK Free, (TRAVATAN) 0.004 % SOLN ophthalmic solution Place 1 drop into both eyes at bedtime.    Yes Historical Provider, MD   Social History   Social History  . Marital Status: Married    Spouse Name: N/A  . Number of Children: N/A  . Years of Education: N/A   Occupational History  . Not on file.   Social History Main Topics  . Smoking status: Never Smoker   . Smokeless tobacco: Not on file  . Alcohol Use: No  . Drug Use: No  . Sexual Activity: Yes   Other Topics Concern  . Not on file   Social History Narrative    Review of Systems  Constitutional: Negative for fever and appetite change.  HENT: Positive for congestion.   Respiratory: Positive for cough. Negative for shortness of breath.   Psychiatric/Behavioral: Positive for sleep disturbance.    Objective:  BP 148/82 mmHg  Pulse 83  Temp(Src) 98.6 F (37 C) (Oral)  Resp 18  Ht 4\' 8"  (1.422 m)  Wt 94 lb (42.638 kg)  BMI 21.09 kg/m2  SpO2 100%  Physical Exam  Constitutional: She is oriented to person, place, and time. She appears well-developed and well-nourished. No distress.  HENT:  Head: Normocephalic and atraumatic.  Right Ear: Hearing, tympanic membrane, external ear and ear canal normal.  Left Ear: Hearing, tympanic membrane, external ear and ear canal normal.  Nose: Nose normal.    Mouth/Throat: Oropharynx is clear and moist. No oropharyngeal exudate.  Eyes: Conjunctivae and EOM are normal. Pupils are equal, round, and reactive to light.  Cardiovascular: Normal rate, regular rhythm, normal heart sounds and intact distal pulses.  Exam reveals no gallop and no friction rub.   No murmur heard. Pulmonary/Chest: Effort normal and breath sounds normal. No respiratory distress. She has no wheezes. She has no rhonchi. She has no rales.  Reproducable anterior chest pain with palpation  Lymphadenopathy:  No lymmph adenopathy in the neck  Neurological: She is alert and oriented to person, place, and time.  Skin: Skin is warm and dry. No rash noted.  Psychiatric: She has a normal mood and affect. Her behavior is normal.  Vitals reviewed.   Assessment & Plan:   Mariah Peterson is a 70 y.o. female Upper respiratory infection  Chest wall pain  Cough  Suspected viral upper respiratory infection, with chest wall pain from coughing. Reassuring exam and vital signs.  -Mucinex or Mucinex DM as needed for cough, saline nasal spray, and Hycodan cough syrup at night if needed. Tylenol as needed for chest wall pain. Symptomatic care discussed and RTC precautions given.  Meds ordered this encounter  Medications  . HYDROcodone-homatropine (HYCODAN) 5-1.5 MG/5ML syrup    Sig: 21m by mouth a bedtime as needed for cough.    Dispense:  120 mL    Refill:  0   Patient Instructions       IF you received an x-ray today, you will receive an invoice from [facility]. Please contact [facility] at [phone] with questions or concerns regarding your invoice.   IF you received labwork today, you will receive an invoice from [employer]. Please contact [facility] at [phone] with questions or concerns regarding your invoice.   Our billing staff will not be able to assist you with questions regarding bills from these companies.  You will be  contacted with the lab results as soon as they are available. The fastest way to get your results is to activate your My Chart account. Instructions are located on the last page of this paperwork. If you have not heard from [place] regarding the results in 2 weeks, please contact this office.     We recommend that you schedule a mammogram for breast cancer screening. Typically, you do not need a referral to do this. Please contact a local imaging center to schedule your mammogram.  [facility] - [phone]  *ask for the Radiology [place] ([place]) - [phone] or [phone]  [facility] - [phone] [place] [phone] [facility] - [phone]  *ask for the [facility] - [phone]  *ask for the Radiology Department [facility] - [phone]  *ask for the [place] - [phone]   Saline nasal spray for nasal congestion, over the counter mucinex or mucinex DM for cough during the day, Hydrocodone cough syrup if needed night, drink plenty of fluids.  Return to clinic if worsening.  Upper Respiratory Infection, Adult Most upper respiratory infections (URIs) are a viral infection of the air passages leading to the lungs. A URI affects the nose, throat, and upper air passages. The most common type of URI is nasopharyngitis and is typically referred to as "the common cold." URIs run their course and usually go away on their own. Most of the time, a URI does not require medical attention, but sometimes a bacterial infection in the upper airways can follow a viral infection. This is called a secondary infection. Sinus and middle ear infections are common types of secondary upper respiratory infections. Bacterial pneumonia can also complicate a URI. A URI can worsen asthma and chronic obstructive pulmonary disease (COPD).  Sometimes, these complications can require emergency medical care and may be life threatening.  CAUSES Almost all URIs are caused by viruses. A virus is a type of germ and can spread from one person to another.  RISKS FACTORS You may be at risk for a URI if:   You smoke.   You have chronic heart or lung disease.  You have a weakened defense (immune) system.   You are very young or very old.   You have nasal allergies or asthma.  You work in crowded or poorly ventilated areas.  You work in health care facilities or schools. SIGNS AND SYMPTOMS  Symptoms typically develop 2-3 days after you come in contact with a cold virus. Most viral URIs last 7-10 days. However, viral URIs from the influenza virus (flu virus) can last 14-18 days and are typically more severe. Symptoms may include:   Runny or stuffy (congested) nose.   Sneezing.   Cough.   Sore throat.   Headache.   Fatigue.   Fever.   Loss of appetite.   Pain in your forehead, behind your eyes, and over your cheekbones (sinus pain).  Muscle aches.  DIAGNOSIS  Your health care provider may diagnose a URI by:  Physical exam.  Tests to check that your symptoms are not due to another condition such as:  Strep throat.  Sinusitis.  Pneumonia.  Asthma. TREATMENT  A URI goes away on its own with time. It cannot be cured with medicines, but medicines may be prescribed or recommended to relieve symptoms. Medicines may help:  Reduce your fever.  Reduce your cough.  Relieve nasal congestion. HOME CARE INSTRUCTIONS   Take medicines only as directed by your health care provider.   Gargle warm saltwater or take cough drops to comfort your throat as directed by your health care provider.  Use a warm mist humidifier or inhale steam from a shower to increase air moisture. This may make it easier to breathe.  Drink enough fluid to keep your urine clear or pale yellow.   Eat soups and other clear  broths and maintain good nutrition.   Rest as needed.   Return to work when your temperature has returned to normal or as your health care provider advises. You may need to stay home longer to avoid infecting others. You can also use a face mask and careful hand washing to prevent spread of the virus.  Increase the usage of your inhaler if you have  asthma.   Do not use any tobacco products, including cigarettes, chewing tobacco, or electronic cigarettes. If you need help quitting, ask your health care provider. PREVENTION  The best way to protect yourself from getting a cold is to practice good hygiene.   Avoid oral or hand contact with people with cold symptoms.   Wash your hands often if contact occurs.  There is no clear evidence that vitamin C, vitamin E, echinacea, or exercise reduces the chance of developing a cold. However, it is always recommended to get plenty of rest, exercise, and practice good nutrition.  SEEK MEDICAL CARE IF:   You are getting worse rather than better.   Your symptoms are not controlled by medicine.   You have chills.  You have worsening shortness of breath.  You have brown or red mucus.  You have yellow or brown nasal discharge.  You have pain in your face, especially when you bend forward.  You have a fever.  You have swollen neck glands.  You have pain while swallowing.  You have white areas in the back of your throat. SEEK IMMEDIATE MEDICAL CARE IF:   You have severe or persistent:  Headache.  Ear pain.  Sinus pain.  Chest pain.  You have chronic lung disease and any of the following:  Wheezing.  Prolonged cough.  Coughing up blood.  A change in your usual mucus.  You have a stiff neck.  You have changes in your:  Vision.  Hearing.  Thinking.  Mood. MAKE SURE YOU:   Understand these instructions.  Will watch your condition.  Will get help right away if you are not doing well or get worse.   This  information is not intended to replace advice given to you by your health care provider. Make sure you discuss any questions you have with your health care provider.   Document Released: 04/03/2001 Document Revised: 02/22/2015 Document Reviewed: 01/13/2014 Elsevier Interactive Patient Education 2016 Elsevier Inc.  Cough, Adult Coughing is a reflex that clears your throat and your airways. Coughing helps to heal and protect your lungs. It is normal to cough occasionally, but a cough that happens with other symptoms or lasts a long time may be a sign of a condition that needs treatment. A cough may last only 2-3 weeks (acute), or it may last longer than 8 weeks (chronic). CAUSES Coughing is commonly caused by:  Breathing in substances that irritate your lungs.  A viral or bacterial respiratory infection.  Allergies.  Asthma.  Postnasal drip.  Smoking.  Acid backing up from the stomach into the esophagus (gastroesophageal reflux).  Certain medicines.  Chronic lung problems, including COPD (or rarely, lung cancer).  Other medical conditions such as heart failure. HOME CARE INSTRUCTIONS  Pay attention to any changes in your symptoms. Take these actions to help with your discomfort:  Take medicines only as told by your health care provider.  If you were prescribed an antibiotic medicine, take it as told by your health care provider. Do not stop taking the antibiotic even if you start to feel better.  Talk with your health care provider before you take a cough suppressant medicine.  Drink enough fluid to keep your urine clear or pale yellow.  If the air is dry, use a cold steam vaporizer or humidifier in your bedroom or your home to help loosen secretions.  Avoid anything that causes you to cough at work or at home.  If your cough is worse at night,  try sleeping in a semi-upright position.  Avoid cigarette smoke. If you smoke, quit smoking. If you need help quitting, ask your  health care provider.  Avoid caffeine.  Avoid alcohol.  Rest as needed. SEEK MEDICAL CARE IF:   You have new symptoms.  You cough up pus.  Your cough does not get better after 2-3 weeks, or your cough gets worse.  You cannot control your cough with suppressant medicines and you are losing sleep.  You develop pain that is getting worse or pain that is not controlled with pain medicines.  You have a fever.  You have unexplained weight loss.  You have night sweats. SEEK IMMEDIATE MEDICAL CARE IF:  You cough up blood.  You have difficulty breathing.  Your heartbeat is very fast.   This information is not intended to replace advice given to you by your health care provider. Make sure you discuss any questions you have with your health care provider.   Document Released: 04/06/2011 Document Revised: 06/29/2015 Document Reviewed: 12/15/2014 Elsevier Interactive Patient Education [employer].      I personally performed the services described in this documentation, which was scribed in my presence. The recorded information has been reviewed and considered, and addended by me as needed.   Signed,   Merri Ray, MD [facility].  04/13/2016 10:59 AM

## 2016-04-13 NOTE — Patient Instructions (Addendum)
IF you received an x-ray today, you will receive an invoice from [facility]. Please contact [facility] at [phone] with questions or concerns regarding your invoice.   IF you received labwork today, you will receive an invoice from [employer]. Please contact [facility] at [phone] with questions or concerns regarding your invoice.   Our billing staff will not be able to assist you with questions regarding bills from these companies.  You will be contacted with the lab results as soon as they are available. The fastest way to get your results is to activate your My Chart account. Instructions are located on the last page of this paperwork. If you have not heard from [place] regarding the results in 2 weeks, please contact this office.     We recommend that you schedule a mammogram for breast cancer screening. Typically, you do not need a referral to do this. Please contact a local imaging center to schedule your mammogram.  [facility] - [phone]  *ask for the Radiology [place] ([place]) - [phone] or [phone]  [facility] - [phone] [place] [phone] [facility] - [phone]  *ask for the [facility] - [phone]  *ask for the Radiology Department [facility] - [phone]  *ask for the Mammography Department [facility] - [phone]   Saline nasal spray for nasal congestion, over the counter mucinex or mucinex DM for cough during the day, Hydrocodone cough syrup if needed night, drink plenty of fluids.  Return to clinic if worsening.  Upper Respiratory Infection, Adult Most upper respiratory infections (URIs) are a viral infection of the air passages leading to the lungs. A URI affects the nose, throat, and upper air passages. The most common type of URI is  nasopharyngitis and is typically referred to as "the common cold." URIs run their course and usually go away on their own. Most of the time, a URI does not require medical attention, but sometimes a bacterial infection in the upper airways can follow a viral infection. This is called a secondary infection. Sinus and middle ear infections are common types of secondary upper respiratory infections. Bacterial pneumonia can also complicate a URI. A URI can worsen asthma and chronic obstructive pulmonary disease (COPD). Sometimes, these complications can require emergency medical care and may be life threatening.  CAUSES Almost all URIs are caused by viruses. A virus is a type of germ and can spread from one person to another.  RISKS FACTORS You may be at risk for a URI if:   You smoke.   You have chronic heart or lung disease.  You have a weakened defense (immune) system.   You are very young or very old.   You have nasal allergies or asthma.  You work in crowded or poorly ventilated areas.  You work in health care facilities or schools. SIGNS AND SYMPTOMS  Symptoms typically develop 2-3 days after you come in contact with a cold virus. Most viral URIs last 7-10 days. However, viral URIs from the influenza virus (flu virus) can last 14-18 days and are typically more severe. Symptoms may include:   Runny or stuffy (congested) nose.   Sneezing.   Cough.   Sore throat.   Headache.   Fatigue.   Fever.   Loss of appetite.   Pain in your forehead, behind your eyes, and over your cheekbones (sinus  pain).  Muscle aches.  DIAGNOSIS  Your health care provider may diagnose a URI by:  Physical exam.  Tests to check that your symptoms are not due to another condition such as:  Strep throat.  Sinusitis.  Pneumonia.  Asthma. TREATMENT  A URI goes away on its own with time. It cannot be cured with medicines, but medicines may be prescribed or recommended to relieve  symptoms. Medicines may help:  Reduce your fever.  Reduce your cough.  Relieve nasal congestion. HOME CARE INSTRUCTIONS   Take medicines only as directed by your health care provider.   Gargle warm saltwater or take cough drops to comfort your throat as directed by your health care provider.  Use a warm mist humidifier or inhale steam from a shower to increase air moisture. This may make it easier to breathe.  Drink enough fluid to keep your urine clear or pale yellow.   Eat soups and other clear broths and maintain good nutrition.   Rest as needed.   Return to work when your temperature has returned to normal or as your health care provider advises. You may need to stay home longer to avoid infecting others. You can also use a face mask and careful hand washing to prevent spread of the virus.  Increase the usage of your inhaler if you have asthma.   Do not use any tobacco products, including cigarettes, chewing tobacco, or electronic cigarettes. If you need help quitting, ask your health care provider. PREVENTION  The best way to protect yourself from getting a cold is to practice good hygiene.   Avoid oral or hand contact with people with cold symptoms.   Wash your hands often if contact occurs.  There is no clear evidence that vitamin C, vitamin E, echinacea, or exercise reduces the chance of developing a cold. However, it is always recommended to get plenty of rest, exercise, and practice good nutrition.  SEEK MEDICAL CARE IF:   You are getting worse rather than better.   Your symptoms are not controlled by medicine.   You have chills.  You have worsening shortness of breath.  You have brown or red mucus.  You have yellow or brown nasal discharge.  You have pain in your face, especially when you bend forward.  You have a fever.  You have swollen neck glands.  You have pain while swallowing.  You have white areas in the back of your throat. SEEK  IMMEDIATE MEDICAL CARE IF:   You have severe or persistent:  Headache.  Ear pain.  Sinus pain.  Chest pain.  You have chronic lung disease and any of the following:  Wheezing.  Prolonged cough.  Coughing up blood.  A change in your usual mucus.  You have a stiff neck.  You have changes in your:  Vision.  Hearing.  Thinking.  Mood. MAKE SURE YOU:   Understand these instructions.  Will watch your condition.  Will get help right away if you are not doing well or get worse.   This information is not intended to replace advice given to you by your health care provider. Make sure you discuss any questions you have with your health care provider.   Document Released: 04/03/2001 Document Revised: 02/22/2015 Document Reviewed: 01/13/2014 Elsevier Interactive Patient Education 2016 Elsevier Inc.  Cough, Adult Coughing is a reflex that clears your throat and your airways. Coughing helps to heal and protect your lungs. It is normal to cough occasionally, but a cough that  happens with other symptoms or lasts a long time may be a sign of a condition that needs treatment. A cough may last only 2-3 weeks (acute), or it may last longer than 8 weeks (chronic). CAUSES Coughing is commonly caused by:  Breathing in substances that irritate your lungs.  A viral or bacterial respiratory infection.  Allergies.  Asthma.  Postnasal drip.  Smoking.  Acid backing up from the stomach into the esophagus (gastroesophageal reflux).  Certain medicines.  Chronic lung problems, including COPD (or rarely, lung cancer).  Other medical conditions such as heart failure. HOME CARE INSTRUCTIONS  Pay attention to any changes in your symptoms. Take these actions to help with your discomfort:  Take medicines only as told by your health care provider.  If you were prescribed an antibiotic medicine, take it as told by your health care provider. Do not stop taking the antibiotic even if  you start to feel better.  Talk with your health care provider before you take a cough suppressant medicine.  Drink enough fluid to keep your urine clear or pale yellow.  If the air is dry, use a cold steam vaporizer or humidifier in your bedroom or your home to help loosen secretions.  Avoid anything that causes you to cough at work or at home.  If your cough is worse at night, try sleeping in a semi-upright position.  Avoid cigarette smoke. If you smoke, quit smoking. If you need help quitting, ask your health care provider.  Avoid caffeine.  Avoid alcohol.  Rest as needed. SEEK MEDICAL CARE IF:   You have new symptoms.  You cough up pus.  Your cough does not get better after 2-3 weeks, or your cough gets worse.  You cannot control your cough with suppressant medicines and you are losing sleep.  You develop pain that is getting worse or pain that is not controlled with pain medicines.  You have a fever.  You have unexplained weight loss.  You have night sweats. SEEK IMMEDIATE MEDICAL CARE IF:  You cough up blood.  You have difficulty breathing.  Your heartbeat is very fast.   This information is not intended to replace advice given to you by your health care provider. Make sure you discuss any questions you have with your health care provider.   Document Released: 04/06/2011 Document Revised: 06/29/2015 Document Reviewed: 12/15/2014 Elsevier Interactive Patient Education [employer].

## 2016-06-04 ENCOUNTER — Other Ambulatory Visit: Payer: Self-pay | Admitting: Internal Medicine

## 2016-06-04 DIAGNOSIS — Z1231 Encounter for screening mammogram for malignant neoplasm of breast: Secondary | ICD-10-CM

## 2016-06-04 DIAGNOSIS — E2839 Other primary ovarian failure: Secondary | ICD-10-CM

## 2016-07-11 ENCOUNTER — Ambulatory Visit
Admission: RE | Admit: 2016-07-11 | Discharge: 2016-07-11 | Disposition: A | Discharge status: Home / Self Care | Payer: Medicare Other | Source: Ambulatory Visit | Attending: Internal Medicine | Admitting: Internal Medicine

## 2016-07-11 DIAGNOSIS — Z1231 Encounter for screening mammogram for malignant neoplasm of breast: Secondary | ICD-10-CM

## 2016-07-11 DIAGNOSIS — E2839 Other primary ovarian failure: Secondary | ICD-10-CM

## 2016-07-13 ENCOUNTER — Other Ambulatory Visit: Payer: Self-pay | Admitting: Internal Medicine

## 2016-07-13 DIAGNOSIS — R928 Other abnormal and inconclusive findings on diagnostic imaging of breast: Secondary | ICD-10-CM

## 2016-07-18 ENCOUNTER — Ambulatory Visit
Admission: RE | Admit: 2016-07-18 | Discharge: 2016-07-18 | Disposition: A | Discharge status: Home / Self Care | Payer: Medicare Other | Source: Ambulatory Visit | Attending: Internal Medicine | Admitting: Internal Medicine

## 2016-07-18 ENCOUNTER — Other Ambulatory Visit: Payer: Self-pay | Admitting: Internal Medicine

## 2016-07-18 DIAGNOSIS — R928 Other abnormal and inconclusive findings on diagnostic imaging of breast: Secondary | ICD-10-CM

## 2016-07-18 DIAGNOSIS — N6310 Unspecified lump in the right breast, unspecified quadrant: Secondary | ICD-10-CM

## 2016-07-25 ENCOUNTER — Other Ambulatory Visit: Payer: Self-pay | Admitting: Internal Medicine

## 2016-07-25 DIAGNOSIS — R928 Other abnormal and inconclusive findings on diagnostic imaging of breast: Secondary | ICD-10-CM

## 2016-07-25 DIAGNOSIS — N6310 Unspecified lump in the right breast, unspecified quadrant: Secondary | ICD-10-CM

## 2016-07-26 ENCOUNTER — Ambulatory Visit
Admission: RE | Admit: 2016-07-26 | Discharge: 2016-07-26 | Disposition: A | Discharge status: Home / Self Care | Payer: Medicare Other | Source: Ambulatory Visit | Attending: Internal Medicine | Admitting: Internal Medicine

## 2016-07-26 DIAGNOSIS — N6310 Unspecified lump in the right breast, unspecified quadrant: Secondary | ICD-10-CM

## 2016-07-26 DIAGNOSIS — R928 Other abnormal and inconclusive findings on diagnostic imaging of breast: Secondary | ICD-10-CM

## 2017-03-20 ENCOUNTER — Ambulatory Visit (INDEPENDENT_AMBULATORY_CARE_PROVIDER_SITE_OTHER): Payer: Medicare Other | Admitting: Ophthalmology

## 2017-04-03 ENCOUNTER — Ambulatory Visit (INDEPENDENT_AMBULATORY_CARE_PROVIDER_SITE_OTHER): Payer: Medicare Other | Admitting: Ophthalmology

## 2017-04-03 DIAGNOSIS — E113293 Type 2 diabetes mellitus with mild nonproliferative diabetic retinopathy without macular edema, bilateral: Secondary | ICD-10-CM | POA: Diagnosis not present

## 2017-04-03 DIAGNOSIS — E11319 Type 2 diabetes mellitus with unspecified diabetic retinopathy without macular edema: Secondary | ICD-10-CM

## 2017-04-03 DIAGNOSIS — I10 Essential (primary) hypertension: Secondary | ICD-10-CM

## 2017-04-03 DIAGNOSIS — H35033 Hypertensive retinopathy, bilateral: Secondary | ICD-10-CM

## 2017-04-03 DIAGNOSIS — H43813 Vitreous degeneration, bilateral: Secondary | ICD-10-CM

## 2018-04-03 ENCOUNTER — Ambulatory Visit (INDEPENDENT_AMBULATORY_CARE_PROVIDER_SITE_OTHER): Payer: Medicare Other | Admitting: Ophthalmology

## 2018-04-03 DIAGNOSIS — E113293 Type 2 diabetes mellitus with mild nonproliferative diabetic retinopathy without macular edema, bilateral: Secondary | ICD-10-CM

## 2018-04-03 DIAGNOSIS — H43813 Vitreous degeneration, bilateral: Secondary | ICD-10-CM | POA: Diagnosis not present

## 2018-04-03 DIAGNOSIS — H35033 Hypertensive retinopathy, bilateral: Secondary | ICD-10-CM

## 2018-04-03 DIAGNOSIS — I10 Essential (primary) hypertension: Secondary | ICD-10-CM | POA: Diagnosis not present

## 2018-04-03 DIAGNOSIS — H35372 Puckering of macula, left eye: Secondary | ICD-10-CM

## 2018-04-03 DIAGNOSIS — E11319 Type 2 diabetes mellitus with unspecified diabetic retinopathy without macular edema: Secondary | ICD-10-CM

## 2018-06-17 NOTE — Progress Notes (Signed)
Patient ID: Mariah Peterson, female   DOB: [birth year], 72 y.o.   MRN: [id number]          Reason for Appointment: Consultation for Type 2 Diabetes  Referring physician: Lorene Dy   History of Present Illness:          Date of diagnosis of type 2 diabetes mellitus:?  1990       Background history:   She was started on insulin about 4 years ago and previously taking metformin and glipizide No detailed history is available and she does not think she has taken any other types of diabetes medications or injections  Recent history:   Most recent A1c is 10.4, was 9.6 in June  INSULIN regimen BT:DHRCBU 32 units 2x daily, Novolog 20 units 3 times daily PC       Non-insulin hypoglycemic drugs the patient is taking are: Metformin 500 mg twice daily, glipizide 10 mg twice daily  Current management, blood sugar patterns and problems identified:   She has been on the same dose of insulin for about 4 months, previously taking lower doses  Although she thinks her blood sugars are not over 100 usually at home she had a glucose of 287 in the lab with her PCP and now it is 326  Also when her blood sugars are reportedly 60 she does not feel any different  She has been taking her NovoLog about half an hour after eating, also when she is not eating at home she will not take this with her  She will occasionally forget her Lantus including this morning  Does not check her sugars after meals  Does not think she has any hypoglycemia symptoms at any time  Usually does not feel unusually weak or tired or complaining of excessive thirst or urination        Side effects from medications have been: None  Compliance with the medical regimen: Fair   Typical meal intake: Breakfast is cereal or oatmeal, sometimes eggs and toast.  Lunch may be rice and beans or a sandwich Fruit for snacks               Exercise:  A little walking at times  Glucose monitoring:  done?  1 times a day          Glucometer: One Touch.       Blood Glucose readings by recall   PREMEAL Breakfast Lunch Dinner Bedtime  Overall   Glucose range: 60-80 100 95    Median:         Dietician visit, most recent: None  Weight history:  Wt Readings from Last 3 Encounters:  06/18/18 93 lb 4.8 oz (42.3 kg)  04/13/16 94 lb (42.6 kg)  03/22/14 93 lb 6.4 oz (42.4 kg)    Glycemic control:   Lab Results  Component Value Date   HGBA1C 10.4 (A) 06/18/2018   Lab Results  Component Value Date   CREATININE 1.01 02/13/2014   No results found for: MICRALBCREAT  No results found for: FRUCTOSAMINE  Office Visit on 06/18/2018  Component Date Value Ref Range Status  . POC Glucose 06/18/2018 326* 70 - 99 mg/dl Final  . Hemoglobin A1C 06/18/2018 10.4* 4.0 - 5.6 % Final    Allergies as of 06/18/2018   No Known Allergies     Medication List        Accurate as of 06/18/18 11:52 AM. Always use your most recent med list.  chlorthalidone 25 MG tablet Commonly known as:  HYGROTON Take 25 mg by mouth daily.   glipiZIDE 10 MG tablet Commonly known as:  GLUCOTROL Take 10 mg by mouth 2 (two) times daily before a meal.   HYDROcodone-homatropine 5-1.5 MG/5ML syrup Commonly known as:  HYCODAN 61m by mouth a bedtime as needed for cough.   insulin aspart 100 UNIT/ML injection Commonly known as:  novoLOG Inject 20 Units into the skin 3 (three) times daily before meals.   insulin glargine 100 UNIT/ML injection Commonly known as:  LANTUS Inject 32 Units into the skin 2 (two) times daily.   lisinopril 40 MG tablet Commonly known as:  PRINIVIL,ZESTRIL Take 40 mg by mouth daily.   metFORMIN 500 MG tablet Commonly known as:  GLUCOPHAGE Take 500 mg by mouth 2 (two) times daily with a meal.   nitrofurantoin (macrocrystal-monohydrate) 100 MG capsule Commonly known as:  MACROBID Take 1 capsule (100 mg total) by mouth 2 (two) times daily.   pioglitazone 15 MG tablet Commonly known as:  ACTOS Take  1 tablet (15 mg total) by mouth daily.   rosuvastatin 40 MG tablet Commonly known as:  CRESTOR Take 1 tablet (40 mg total) by mouth daily.   simvastatin 80 MG tablet Commonly known as:  ZOCOR Take 80 mg by mouth daily.   Travoprost (BAK Free) 0.004 % Soln ophthalmic solution Commonly known as:  TRAVATAN Place 1 drop into both eyes at bedtime.       Allergies: No Known Allergies  Past Medical History:  Diagnosis Date  . Diabetes mellitus without complication ([place])   . Glaucoma   . Hypertension     Past Surgical History:  Procedure Laterality Date  . CESAREAN SECTION    . EYE SURGERY      Family History  Problem Relation Age of Onset  . Hypertension Mother   . Diabetes Brother   . CAD Brother   . Hypertension Brother   . Diabetes Brother   . Hypertension Brother     Social History:  reports that she has never smoked. She has never used smokeless tobacco. She reports that she does not drink alcohol or use drugs.   Review of Systems  Constitutional: Positive for weight loss. Negative for reduced appetite.       Has lost 15-20 pounds in 1 yr  HENT: Negative for headaches.   Eyes: Negative for blurred vision.  Respiratory: Negative for shortness of breath.   Cardiovascular: Negative for leg swelling.  Gastrointestinal: Negative for diarrhea and abdominal pain.  Endocrine: Negative for fatigue and polydipsia.  Genitourinary: Positive for nocturia.       Usually nocturia once  Musculoskeletal: Negative for joint pain.  Skin: Negative for itching.  Neurological: Positive for tingling.       Mild tingling not interfering with sleep.  No sharp pains in her legs  Psychiatric/Behavioral: Negative for insomnia.     Lipid history: Currently on 80 mg and simvastatin with last LDL 124   No results found for: CHOL, HDL, LDLCALC, LDLDIRECT, TRIG, CHOLHDL         Hypertension: Has been present  BP Readings from Last 3 Encounters:  06/18/18 (!) 148/84  04/13/16 (!)  148/82  05/01/15 177/83    Most recent eye exam was in 5/19  Most recent foot exam: 8/19  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  LABS:  Office Visit on 06/18/2018  Component Date Value Ref Range Status  . POC Glucose 06/18/2018 326*  70 - 99 mg/dl Final  . Hemoglobin A1C 06/18/2018 10.4* 4.0 - 5.6 % Final    Physical Examination:  BP (!) 148/84   Pulse 86   Ht 4\' 10"  (1.473 m)   Wt 93 lb 4.8 oz (42.3 kg)   SpO2 99%   BMI 19.50 kg/m   GENERAL:     Mildly asthenic, pleasant and cooperative  HEENT:         Eye exam shows normal external appearance.  Fundus exam shows no retinopathy.  Oral exam shows normal mucosa .   NECK:   There is no lymphadenopathy  Thyroid is not enlarged and no nodules felt.   Carotids are normal to palpation and no bruit heard  LUNGS:         Chest is symmetrical. Lungs are clear to auscultation.Marland Kitchen   HEART:         Heart sounds:  S1 and S2 are normal. No murmur or click heard., no S3 or S4.   ABDOMEN:   There is no distention present. Liver and spleen are not palpable.  No other mass or tenderness present.    NEUROLOGICAL:   Ankle jerks are normal bilaterally.    Diabetic Foot Exam - Simple   Simple Foot Form Diabetic Foot exam was performed with the following findings:  Yes 06/18/2018 11:45 AM  Visual Inspection No deformities, no ulcerations, no other skin breakdown bilaterally:  Yes Sensation Testing Intact to touch and monofilament testing bilaterally:  Yes Pulse Check Posterior Tibialis and Dorsalis pulse intact bilaterally:  Yes See comments:  Yes Comments Dorsalis pedis not palpable bilaterally, right posterior tibialis 2/4            Vibration sense is mildly reduced in distal first toes.  MUSCULOSKELETAL:  There is no swelling or deformity of the peripheral joints.     EXTREMITIES:     There is no ankle edema.  SKIN:       No rash or lesions of concern.        ASSESSMENT:  Diabetes type 2,  insulin-dependent and persistently poorly controlled  A1c is now 10.4, higher than earlier this year  See history of present illness for detailed discussion of current diabetes management, blood sugar patterns and problems identified  Appears to be getting falsely low readings at home on her [employer] meter as both her A1c in office glucose are significantly high Not clear if she is checking her sugars regularly Also not sure if she is taking her insulin consistently although she reports missing her Lantus only once in a while Most likely has relatively higher postprandial readings  Complications of diabetes: Minimal, needs urine microalbumin assessed  LIPIDS: Her LDL is well above 100 and is inadequately controlled with simvastatin 80 mg Discussed need for better control and use of high-dose statins to reduce cardiovascular risk especially with her family history of CAD  HYPERTENSION: Appears well controlled  PLAN:    She will be given the One Touch Verio monitor to check her sugars regularly  Discussed how often or when to check her blood sugars  Discussed blood sugar targets at various times  She will be needing much more diabetes education and will be scheduled to see both her diabetes educator and nutritionist  She also needs to be taught how to use an insulin pen  Discussed meal planning especially balanced meals with some protein in the morning and avoiding cereal  Since it is unclear what her FASTING blood sugars are  we will continue the same dose of Lantus  Once she is finishing her Lantus will switch her to TRESIBA U-200, 64 units once a day in the evening  NOVOLOG must be taken before eating, preferably 10 to 15 minutes before the meal  She will go up to 25 minutes on NovoLog before breakfast and suppertime  Trial of ACTOS 15 mg daily for her insulin resistance and will increase the dose if no side effects on the next visit, discussed possible side effects of edema or  weight gain  We will increase her metformin to 2 tablets in the evening for now and next week that another 500 mg at breakfast also  To get reports of renal function from PCP  LIPIDS needs to be better controlled with switching from simvastatin to rosuvastatin 40 mg daily but may also need to add Zetia.  Check MICROALBUMIN on the next visit  Follow-up in 3 weeks  Patient Instructions  Check blood sugars on waking up every other day  Also check blood sugars about 2 hours after a meal and do this at least once a day after different meals by rotation  Recommended blood sugar levels on waking up is 90-130 and about 2 hours after meal is 130-160  Please bring your blood sugar monitor to each visit, thank you  Continue taking Lantus 32 units twice a day, may take it at the same time as NovoLog  Call before refilling the Lantus, will be switching to [place] pen 64 units once a day  NOVOLOG insulin: Take 25 units before breakfast and supper and 20 units before lunch Take the insulin which you when going out to eat  Metformin: Take 2 tablets at dinnertime instead of 1 for the next week and then go up to 2 tablets twice a day at breakfast and suppertime Call when needing a new prescription  MORNING meal should be inclusive of a protein such as low-fat dairy or meat or eggs Avoid cereal  PIOGLITAZONE: This is a new medication and can be taken either morning or evening  Change simvastatin to rosuvastatin 40 mg daily for cholesterol    Consultation note has been sent to the referring physician  Counseling time on subjects discussed in assessment and plan sections is over 50% of today's 60 minute visit   Elayne Snare 06/18/2018, 11:52 AM   Note: This office note was prepared with Dragon voice recognition system technology. Any transcriptional errors that result from this process are unintentional.

## 2018-06-18 ENCOUNTER — Encounter: Payer: Self-pay | Admitting: Endocrinology

## 2018-06-18 ENCOUNTER — Ambulatory Visit (INDEPENDENT_AMBULATORY_CARE_PROVIDER_SITE_OTHER): Payer: Medicare Other | Admitting: Endocrinology

## 2018-06-18 VITALS — BP 148/84 | HR 86 | Ht <= 58 in | Wt 93.3 lb

## 2018-06-18 DIAGNOSIS — I10 Essential (primary) hypertension: Secondary | ICD-10-CM | POA: Diagnosis not present

## 2018-06-18 DIAGNOSIS — E7800 Pure hypercholesterolemia, unspecified: Secondary | ICD-10-CM

## 2018-06-18 DIAGNOSIS — E1165 Type 2 diabetes mellitus with hyperglycemia: Secondary | ICD-10-CM | POA: Diagnosis not present

## 2018-06-18 DIAGNOSIS — Z794 Long term (current) use of insulin: Secondary | ICD-10-CM

## 2018-06-18 LAB — POCT GLYCOSYLATED HEMOGLOBIN (HGB A1C): Hemoglobin A1C: 10.4 % — AB (ref 4.0–5.6)

## 2018-06-18 LAB — GLUCOSE, POCT (MANUAL RESULT ENTRY): POC GLUCOSE: 326 mg/dL — AB (ref 70–99)

## 2018-06-18 MED ORDER — PIOGLITAZONE HCL 15 MG PO TABS: 15.0000 mg | ORAL_TABLET | Freq: Every day | ORAL | 2 refills | Status: DC

## 2018-06-18 MED ORDER — ROSUVASTATIN CALCIUM 40 MG PO TABS: 40.0000 mg | ORAL_TABLET | Freq: Every day | ORAL | 3 refills | Status: DC

## 2018-06-18 NOTE — Patient Instructions (Addendum)
Check blood sugars on waking up every other day  Also check blood sugars about 2 hours after a meal and do this at least once a day after different meals by rotation  Recommended blood sugar levels on waking up is 90-130 and about 2 hours after meal is 130-160  Please bring your blood sugar monitor to each visit, thank you  Continue taking Lantus 32 units twice a day, may take it at the same time as NovoLog  Call before refilling the Lantus, will be switching to [place] pen 64 units once a day  NOVOLOG insulin: Take 25 units before breakfast and supper and 20 units before lunch Take the insulin which you when going out to eat  Metformin: Take 2 tablets at dinnertime instead of 1 for the next week and then go up to 2 tablets twice a day at breakfast and suppertime Call when needing a new prescription  MORNING meal should be inclusive of a protein such as low-fat dairy or meat or eggs Avoid cereal  PIOGLITAZONE: This is a new medication and can be taken either morning or evening  Change simvastatin to rosuvastatin 40 mg daily for cholesterol

## 2018-06-26 ENCOUNTER — Emergency Department (HOSPITAL_COMMUNITY)
Admission: EM | Admit: 2018-06-26 | Discharge: 2018-06-26 | Disposition: A | Discharge status: Home / Self Care | Payer: Medicare Other | Attending: Emergency Medicine | Admitting: Emergency Medicine

## 2018-06-26 ENCOUNTER — Other Ambulatory Visit: Payer: Self-pay

## 2018-06-26 DIAGNOSIS — R4182 Altered mental status, unspecified: Secondary | ICD-10-CM | POA: Diagnosis present

## 2018-06-26 DIAGNOSIS — Z794 Long term (current) use of insulin: Secondary | ICD-10-CM | POA: Diagnosis not present

## 2018-06-26 DIAGNOSIS — Z79899 Other long term (current) drug therapy: Secondary | ICD-10-CM | POA: Insufficient documentation

## 2018-06-26 DIAGNOSIS — E162 Hypoglycemia, unspecified: Secondary | ICD-10-CM

## 2018-06-26 DIAGNOSIS — I10 Essential (primary) hypertension: Secondary | ICD-10-CM | POA: Diagnosis not present

## 2018-06-26 DIAGNOSIS — E11649 Type 2 diabetes mellitus with hypoglycemia without coma: Secondary | ICD-10-CM | POA: Insufficient documentation

## 2018-06-26 LAB — COMPREHENSIVE METABOLIC PANEL
ALT: 15 U/L (ref 0–44)
ANION GAP: 11 (ref 5–15)
AST: 23 U/L (ref 15–41)
Albumin: 3.7 g/dL (ref 3.5–5.0)
Alkaline Phosphatase: 41 U/L (ref 38–126)
BUN: 31 mg/dL — AB (ref 8–23)
CO2: 21 mmol/L — ABNORMAL LOW (ref 22–32)
Calcium: 9.5 mg/dL (ref 8.9–10.3)
Chloride: 106 mmol/L (ref 98–111)
Creatinine, Ser: 1.5 mg/dL — ABNORMAL HIGH (ref 0.44–1.00)
GFR calc Af Amer: 39 mL/min — ABNORMAL LOW (ref >60)
GFR calc non Af Amer: 34 mL/min — ABNORMAL LOW (ref >60)
GLUCOSE: 95 mg/dL (ref 70–99)
POTASSIUM: 4.5 mmol/L (ref 3.5–5.1)
SODIUM: 138 mmol/L (ref 135–145)
TOTAL PROTEIN: 6.9 g/dL (ref 6.5–8.1)
Total Bilirubin: 0.4 mg/dL (ref 0.3–1.2)

## 2018-06-26 LAB — URINALYSIS, ROUTINE W REFLEX MICROSCOPIC
BILIRUBIN URINE: NEGATIVE
Bacteria, UA: NONE SEEN
KETONES UR: NEGATIVE mg/dL
LEUKOCYTES UA: NEGATIVE
Nitrite: NEGATIVE
PH: 5 (ref 5.0–8.0)
PROTEIN: NEGATIVE mg/dL
Specific Gravity, Urine: 1.011 (ref 1.005–1.030)

## 2018-06-26 LAB — CBC
HCT: 34.8 % — ABNORMAL LOW (ref 36.0–46.0)
HEMOGLOBIN: 10.7 g/dL — AB (ref 12.0–15.0)
MCH: 29.4 pg (ref 26.0–34.0)
MCHC: 30.7 g/dL (ref 30.0–36.0)
MCV: 95.6 fL (ref 78.0–100.0)
PLATELETS: 214 10*3/uL (ref 150–400)
RBC: 3.64 MIL/uL — AB (ref 3.87–5.11)
RDW: 12.6 % (ref 11.5–15.5)
WBC: 7.5 10*3/uL (ref 4.0–10.5)

## 2018-06-26 LAB — CBG MONITORING, ED
Glucose-Capillary: 184 mg/dL — ABNORMAL HIGH (ref 70–99)
Glucose-Capillary: 51 mg/dL — ABNORMAL LOW (ref 70–99)
Glucose-Capillary: 72 mg/dL (ref 70–99)
Glucose-Capillary: 82 mg/dL (ref 70–99)

## 2018-06-26 MED ORDER — ONDANSETRON HCL 4 MG/2ML IJ SOLN
4.0000 mg | Freq: Once | INTRAMUSCULAR | Status: AC
  Administered 2018-06-26: 4 mg via INTRAVENOUS
  Filled 2018-06-26: qty 2

## 2018-06-26 NOTE — Discharge Instructions (Addendum)
It was our pleasure to provide your ER care today - we hope that you feel better.  Do not take any insulin tonight. Eat snack before bed.  Check your sugars 4x/day and record values in log book. Given your recent visit to your diabetes doctor and the discussion then about changing your diabetic medication program - please contact your diabetes doctor tomorrow morning to discuss your low blood sugar tonight and to clarify your diabetic medication doses, and to arrange follow up with them in the next 1-2 days.   If you begin to feel sugar may be low, eat or drink something immediately, and check sugar.  Return to ER if worse, symptoms recur, new symptoms, fevers, vomiting, trouble breathing, other concern.

## 2018-06-26 NOTE — ED Notes (Signed)
Patient verbalizes understanding of discharge instructions. Opportunity for questioning and answers were provided. Armband removed by staff, pt discharged from ED in wheelchair.  

## 2018-06-26 NOTE — ED Triage Notes (Addendum)
[facility] c/o for AMS,  Diabetic pt CBG 24 on arrival. Given 2 cups orange juice, sugar up to 33. Received D10, sugar up to 196, 500 NS bolus held at 190 for [facility].  Vital signs: 148/81; HR 81; Spo2 100% Alert ox3 on arrival, disoriented to situation, speech clear CBG 184

## 2018-06-26 NOTE — ED Provider Notes (Signed)
[facility] EMERGENCY DEPARTMENT Provider Note   CSN: [id number] Arrival date & time: 06/26/18  1743     History   Chief Complaint Chief Complaint  Patient presents with  . Hypoglycemia    HPI Teran N Blecher is a 72 y.o. female.  Patient with hx iddm, presents with acute altered mental status, and low blood sugar. Ems notes cbg in 20s, gave po fluids, was 33, then d10 and glucose 196 with improvement in mental status. Pt denies recent change in meds or in insulin dose. Indicates did eat earlier today. No nvd. No dysuria or gu c/o. No abd pain. Denies chest pain or sob. No uri symptoms. No headache. Checks glucose prn at home, but not consistently. Pt/family deny history of similar symptoms. Pt states feels fine now, denies c/o.   The history is provided by the patient, a relative and the EMS personnel.  Hypoglycemia  Associated symptoms: no shortness of breath and no vomiting     Past Medical History:  Diagnosis Date  . Diabetes mellitus without complication ([place])   . Glaucoma   . Hypertension     Patient Active Problem List   Diagnosis Date Noted  . Other and unspecified hyperlipidemia 08/04/2013  . Type II or unspecified type diabetes mellitus without mention of complication, not stated as uncontrolled 08/04/2013    Past Surgical History:  Procedure Laterality Date  . CESAREAN SECTION    . EYE SURGERY       OB History   None      Home Medications    Prior to Admission medications   Medication Sig Start Date End Date Taking? Authorizing Provider  chlorthalidone (HYGROTON) 25 MG tablet Take 25 mg by mouth daily.    [provider]  glipiZIDE (GLUCOTROL) 10 MG tablet Take 10 mg by mouth 2 (two) times daily before a meal.    [provider]  HYDROcodone-homatropine (HYCODAN) 5-1.5 MG/5ML syrup 23m by mouth a bedtime as needed for cough. 04/13/16   Wendie Agreste, MD  insulin aspart (NOVOLOG) 100 UNIT/ML injection Inject 20  Units into the skin 3 (three) times daily before meals.    [provider]  insulin glargine (LANTUS) 100 UNIT/ML injection Inject 32 Units into the skin 2 (two) times daily.     [provider]  lisinopril (PRINIVIL,ZESTRIL) 40 MG tablet Take 40 mg by mouth daily.    [provider]  metFORMIN (GLUCOPHAGE) 500 MG tablet Take 500 mg by mouth 2 (two) times daily with a meal.    [provider]  nitrofurantoin, macrocrystal-monohydrate, (MACROBID) 100 MG capsule Take 1 capsule (100 mg total) by mouth 2 (two) times daily. 03/22/14   Roselee Culver, MD  pioglitazone (ACTOS) 15 MG tablet Take 1 tablet (15 mg total) by mouth daily. 06/18/18   Elayne Snare, MD  rosuvastatin (CRESTOR) 40 MG tablet Take 1 tablet (40 mg total) by mouth daily. 06/18/18   Elayne Snare, MD  simvastatin (ZOCOR) 80 MG tablet Take 80 mg by mouth daily.    [provider]  Travoprost, BAK Free, (TRAVATAN) 0.004 % SOLN ophthalmic solution Place 1 drop into both eyes at bedtime.     [provider]    Family History Family History  Problem Relation Age of Onset  . Hypertension Mother   . Diabetes Brother   . CAD Brother   . Hypertension Brother   . Diabetes Brother   . Hypertension Brother     Social  History Social History   Tobacco Use  . Smoking status: Never Smoker  . Smokeless tobacco: Never Used  Substance Use Topics  . Alcohol use: No  . Drug use: No     Allergies   Patient has no known allergies.   Review of Systems Review of Systems  Constitutional: Negative for fever.  HENT: Negative for sore throat.   Eyes: Negative for redness.  Respiratory: Negative for shortness of breath.   Cardiovascular: Negative for chest pain.  Gastrointestinal: Negative for abdominal pain, diarrhea and vomiting.  Genitourinary: Negative for dysuria and flank pain.  Musculoskeletal: Negative for back pain and neck pain.  Skin: Negative for rash.  Neurological:  Negative for headaches.  Hematological: Does not bruise/bleed easily.  Psychiatric/Behavioral: Negative for dysphoric mood.     Physical Exam Updated Vital Signs BP 138/66   Pulse 88   Resp 18   SpO2 100%   Physical Exam  Constitutional: She appears well-developed and well-nourished.  HENT:  Head: Atraumatic.  Eyes: Pupils are equal, round, and reactive to light. Conjunctivae are normal. No scleral icterus.  Neck: Neck supple. No tracheal deviation present. No thyromegaly present.  Cardiovascular: Normal rate, regular rhythm, normal heart sounds and intact distal pulses. Exam reveals no gallop and no friction rub.  No murmur heard. Pulmonary/Chest: Effort normal and breath sounds normal. No respiratory distress.  Abdominal: Soft. Normal appearance and bowel sounds are normal. She exhibits no distension and no mass. There is no tenderness. There is no guarding.  Genitourinary:  Genitourinary Comments: No cva tenderness  Musculoskeletal: She exhibits no edema.  Neurological: She is alert.  Speech clear/fluent. Motor/sens grossly intact bil. Steady gait.   Skin: Skin is warm and dry. No rash noted.  Psychiatric: She has a normal mood and affect.  Nursing note and vitals reviewed.    ED Treatments / Results  Labs (all labs ordered are listed, but only abnormal results are displayed) Results for orders placed or performed during the hospital encounter of 06/26/18  CBC  Result Value Ref Range   WBC 7.5 4.0 - 10.5 K/uL   RBC 3.64 (L) 3.87 - 5.11 MIL/uL   Hemoglobin 10.7 (L) 12.0 - 15.0 g/dL   HCT 34.8 (L) 36.0 - 46.0 %   MCV 95.6 78.0 - 100.0 fL   MCH 29.4 26.0 - 34.0 pg   MCHC 30.7 30.0 - 36.0 g/dL   RDW 12.6 11.5 - 15.5 %   Platelets 214 150 - 400 K/uL  Comprehensive metabolic panel  Result Value Ref Range   Sodium 138 135 - 145 mmol/L   Potassium 4.5 3.5 - 5.1 mmol/L   Chloride 106 98 - 111 mmol/L   CO2 21 (L) 22 - 32 mmol/L   Glucose, Bld 95 70 - 99 mg/dL   BUN 31  (H) 8 - 23 mg/dL   Creatinine, Ser 1.50 (H) 0.44 - 1.00 mg/dL   Calcium 9.5 8.9 - 10.3 mg/dL   Total Protein 6.9 6.5 - 8.1 g/dL   Albumin 3.7 3.5 - 5.0 g/dL   AST 23 15 - 41 U/L   ALT 15 0 - 44 U/L   Alkaline Phosphatase 41 38 - 126 U/L   Total Bilirubin 0.4 0.3 - 1.2 mg/dL   GFR calc non Af Amer 34 (L) >60 mL/min   GFR calc Af Amer 39 (L) >60 mL/min   Anion gap 11 5 - 15  Urinalysis, Routine w reflex microscopic  Result Value Ref Range   Color,  Urine YELLOW YELLOW   APPearance HAZY (A) CLEAR   Specific Gravity, Urine 1.011 1.005 - 1.030   pH 5.0 5.0 - 8.0   Glucose, UA >=500 (A) NEGATIVE mg/dL   Hgb urine dipstick SMALL (A) NEGATIVE   Bilirubin Urine NEGATIVE NEGATIVE   Ketones, ur NEGATIVE NEGATIVE mg/dL   Protein, ur NEGATIVE NEGATIVE mg/dL   Nitrite NEGATIVE NEGATIVE   Leukocytes, UA NEGATIVE NEGATIVE   RBC / HPF 0-5 0 - 5 RBC/hpf   WBC, UA 0-5 0 - 5 WBC/hpf   Bacteria, UA NONE SEEN NONE SEEN   Squamous Epithelial / LPF 0-5 0 - 5   Mucus PRESENT   CBG monitoring, ED  Result Value Ref Range   Glucose-Capillary 184 (H) 70 - 99 mg/dL  CBG monitoring, ED  Result Value Ref Range   Glucose-Capillary 51 (L) 70 - 99 mg/dL  POC CBG, ED  Result Value Ref Range   Glucose-Capillary 72 70 - 99 mg/dL  POC CBG, ED  Result Value Ref Range   Glucose-Capillary 82 70 - 99 mg/dL   Comment 1 Notify RN    Comment 2 Document in Chart   EKG None  Radiology No results found.  Procedures Procedures (including critical care time)  Medications Ordered in ED Medications - No data to display   Initial Impression / Assessment and Plan / ED Course  I have reviewed the triage vital signs and the nursing notes.  Pertinent labs & imaging results that were available during my care of the patient were reviewed by me and considered in my medical decision making (see chart for details).  Cbg.   Iv ns. Labs. Monitor.   Labs sent.  Po fluids and meal given.   Reviewed nursing notes  and prior charts for additional history.   Pt encouraged to eat/drink. Sandwich/po fluids. Recheck glucose improved. pts mental status at baseline. Pt denies any c/o. No pain. No nv. No fever.   Repeat glucose x 2 normal. Pt has eaten. Pt currently appears stable for d/c.  Recent note from pts endocrine doctor re plans for changing diabetic meds - pt to call office in AM to clarify changes and f/u.      Final Clinical Impressions(s) / ED Diagnoses   Final diagnoses:  None    ED Discharge Orders    None       Lajean Saver, MD 06/26/18 2304

## 2018-06-27 ENCOUNTER — Telehealth: Payer: Self-pay | Admitting: Endocrinology

## 2018-06-27 NOTE — Telephone Encounter (Signed)
Please find out what the blood sugars are doing at home In the meantime she will reduce her morning Lantus to 28 units and the lunchtime NovoLog to 14 units.  She will need to check her sugars 4 times a day and review with nurse educator next week

## 2018-06-27 NOTE — Telephone Encounter (Signed)
[facility] ph# [phone] called re: status of RX for diabetic supplies

## 2018-06-27 NOTE — Telephone Encounter (Signed)
Please call her back.  Lantus will now be 26 units both morning and evening NovoLog will be 20 units at breakfast and suppertime and 14 at lunch. Confirm that she is taking pioglitazone that was started

## 2018-06-27 NOTE — Telephone Encounter (Signed)
Please advise on below  

## 2018-06-27 NOTE — Telephone Encounter (Signed)
Pt is aware.  

## 2018-06-27 NOTE — Telephone Encounter (Signed)
She stated that today her sugars have been: 730am 45       823am 77       953am 146 Pt is aware of dose changes.

## 2018-06-27 NOTE — Telephone Encounter (Signed)
That makes a difference. She will take only 14 units of Lantus twice a day, 8 units of NovoLog at breakfast and suppertime and none at lunchtime for now.

## 2018-06-27 NOTE — Telephone Encounter (Signed)
Pt wanted to make you aware she has not taken any insulin at all today, and wants to know if that effects her doses?

## 2018-06-27 NOTE — Telephone Encounter (Signed)
Patient had to go to ER last nigh due to blood sugar being too low. Please call patient at ph# [phone] to advise.  [facility] will be calling to verify she is a patient here so that she can order diabetic supplies.

## 2018-06-30 ENCOUNTER — Telehealth: Payer: Self-pay | Admitting: Endocrinology

## 2018-06-30 NOTE — Telephone Encounter (Signed)
[facility] ph# [phone] called re: verify that RX they sent over was received. Please call ph# listed herein to give status.

## 2018-07-01 ENCOUNTER — Encounter: Payer: Medicare Other | Attending: Endocrinology | Admitting: Nutrition

## 2018-07-01 DIAGNOSIS — Z794 Long term (current) use of insulin: Secondary | ICD-10-CM | POA: Diagnosis not present

## 2018-07-01 DIAGNOSIS — E1165 Type 2 diabetes mellitus with hyperglycemia: Secondary | ICD-10-CM | POA: Diagnosis not present

## 2018-07-01 DIAGNOSIS — Z713 Dietary counseling and surveillance: Secondary | ICD-10-CM | POA: Diagnosis not present

## 2018-07-01 DIAGNOSIS — E119 Type 2 diabetes mellitus without complications: Secondary | ICD-10-CM

## 2018-07-02 ENCOUNTER — Telehealth: Payer: Self-pay | Admitting: Emergency Medicine

## 2018-07-02 ENCOUNTER — Telehealth: Payer: Self-pay | Admitting: Nutrition

## 2018-07-02 NOTE — Progress Notes (Signed)
Patient is here with her daughter and husband.  She reports that she is taking Lantus 14 BID and Novolog 8u bid.  She is mixing the two insulins and in the same syringe.  Blood sugar in the office now was 42.  Meals are low in carbs. Bfast: 8AM 2 eggs, bacon.  Coffee with small amount of cream, and splenda, 11AM: piece of fruit Lunch 1-2 PM: sandwich, water or diet drink 4PM: "few peanut butter crackers-3-4.   6PM: 3-5 ounces of protein with 30 grams of carb, in the form of starchy veg., and non starchy veg. With water to drink 8PM: fruit with cottage cheese  Plan:  Do no mix the insulins.   Reduce acS snack to 5u tonight, and test blood sugars before supper, bedtime and morning, and I will call her with readings. Written instructions given for this. Per Dr. Arman Filter verbal order, insulin doses should stay the same, but do not mix.

## 2018-07-02 NOTE — Telephone Encounter (Signed)
She can stop taking her Lantus in the evening and wait till dinnertime to take NovoLog, new NovoLog dose will be 5 units before meals.  Continue with plan to take 10 units Lantus in the morning

## 2018-07-02 NOTE — Telephone Encounter (Signed)
She has not taken her insulin this AM. She is going to take 8 Novolog and 14N.  She was told to hold this until I call her back.   Do you want her to take her normal AM dose and reduce the PM dose of Lantus to 6u?

## 2018-07-02 NOTE — Patient Instructions (Signed)
Do no mix the insulins.   Reduce acS snack to 5u tonight, and test blood sugars before supper, bedtime and morning.

## 2018-07-02 NOTE — Telephone Encounter (Signed)
Blood sugar last night 1 hour after supper, was 121.  She took [employer] and 8u Lantus.  FBS today was 45

## 2018-07-02 NOTE — Telephone Encounter (Signed)
[facility] called to verify that the prescription they sent over was received. Please call [phone] to give status of form thanks.

## 2018-07-02 NOTE — Telephone Encounter (Signed)
If her fasting readings are low she can reduce her evening Lantus to 10 units but needs to reduce her morning Lantus to 10 units for now

## 2018-07-02 NOTE — Telephone Encounter (Signed)
See new phone message.

## 2018-07-02 NOTE — Telephone Encounter (Signed)
Patient called and told to take Lantus only in the AM-10u and Novolog 5u ac all meals.  She repeated this back to me correctly.  Reminded her not to mix these insulins in the same syringe in the morning.  She voiced good understanding of this.

## 2018-07-04 ENCOUNTER — Telehealth: Payer: Self-pay | Admitting: Endocrinology

## 2018-07-04 NOTE — Telephone Encounter (Signed)
[place] calling form diabetic supply company [phone] Received the rx for the supplies--but they do not provide the one touch to any patients.   Will need to find a different supply company for the patient if the patient specifically needs the one touch

## 2018-07-08 ENCOUNTER — Telehealth: Payer: Self-pay

## 2018-07-08 NOTE — Telephone Encounter (Signed)
Completed form for [facility] and placed on Dr. Jodelle Green desk to be signed.

## 2018-07-10 ENCOUNTER — Other Ambulatory Visit (INDEPENDENT_AMBULATORY_CARE_PROVIDER_SITE_OTHER): Payer: Medicare Other

## 2018-07-10 DIAGNOSIS — E7800 Pure hypercholesterolemia, unspecified: Secondary | ICD-10-CM

## 2018-07-10 DIAGNOSIS — Z794 Long term (current) use of insulin: Secondary | ICD-10-CM

## 2018-07-10 DIAGNOSIS — E1165 Type 2 diabetes mellitus with hyperglycemia: Secondary | ICD-10-CM

## 2018-07-10 LAB — LIPID PANEL
CHOL/HDL RATIO: 2
Cholesterol: 98 mg/dL (ref 0–200)
HDL: 50 mg/dL (ref >39.00)
LDL CALC: 38 mg/dL (ref 0–99)
NonHDL: 48.09
TRIGLYCERIDES: 52 mg/dL (ref 0.0–149.0)
VLDL: 10.4 mg/dL (ref 0.0–40.0)

## 2018-07-10 LAB — MICROALBUMIN / CREATININE URINE RATIO
CREATININE, U: 70.7 mg/dL
MICROALB/CREAT RATIO: 11.4 mg/g (ref 0.0–30.0)
Microalb, Ur: 8.1 mg/dL — ABNORMAL HIGH (ref 0.0–1.9)

## 2018-07-10 LAB — BASIC METABOLIC PANEL
BUN: 31 mg/dL — AB (ref 6–23)
CO2: 24 mEq/L (ref 19–32)
CREATININE: 1.66 mg/dL — AB (ref 0.40–1.20)
Calcium: 9.8 mg/dL (ref 8.4–10.5)
Chloride: 106 mEq/L (ref 96–112)
GFR: 39.13 mL/min — AB (ref >60.00)
Glucose, Bld: 135 mg/dL — ABNORMAL HIGH (ref 70–99)
Potassium: 4.2 mEq/L (ref 3.5–5.1)
Sodium: 139 mEq/L (ref 135–145)

## 2018-07-11 LAB — FRUCTOSAMINE: FRUCTOSAMINE: 299 umol/L — AB (ref 0–285)

## 2018-07-14 ENCOUNTER — Encounter: Payer: Self-pay | Admitting: Endocrinology

## 2018-07-14 ENCOUNTER — Ambulatory Visit (INDEPENDENT_AMBULATORY_CARE_PROVIDER_SITE_OTHER): Payer: Medicare Other | Admitting: Endocrinology

## 2018-07-14 VITALS — BP 148/80 | HR 93 | Ht 59.0 in | Wt 96.0 lb

## 2018-07-14 DIAGNOSIS — N183 Chronic kidney disease, stage 3 unspecified: Secondary | ICD-10-CM

## 2018-07-14 DIAGNOSIS — E7800 Pure hypercholesterolemia, unspecified: Secondary | ICD-10-CM | POA: Diagnosis not present

## 2018-07-14 DIAGNOSIS — Z794 Long term (current) use of insulin: Secondary | ICD-10-CM

## 2018-07-14 DIAGNOSIS — E1165 Type 2 diabetes mellitus with hyperglycemia: Secondary | ICD-10-CM | POA: Diagnosis not present

## 2018-07-14 MED ORDER — GLUCOSE BLOOD VI STRP: ORAL_STRIP | 3 refills | Status: DC

## 2018-07-14 NOTE — Progress Notes (Signed)
Patient ID: Mariah Peterson, female   DOB: [birth year], 72 y.o.   MRN: [id number]          Reason for Appointment: for Type 2 Diabetes  Referring physician: Lorene Dy   History of Present Illness:          Date of diagnosis of type 2 diabetes mellitus:?  1990       Background history:   She was started on insulin about 4 years ago and previously taking metformin and glipizide No detailed history is available and she does not think she has taken any other types of diabetes medications or injections  Recent history:   Most recent A1c is 10.4, was 9.6 in June  INSULIN regimen XI:[id number] 10 units daily, Novolog 5units 3 times daily with food       Non-insulin hypoglycemic drugs the patient is taking are: Metformin 500 mg twice daily  Current management, blood sugar patterns and problems identified:  With her starting Actos her blood sugars have been very significantly lower and she has had tendency to hypoglycemia  Her insulin dose has been reduced sharply and  previously was taking 60 units daily of each Lantus and NovoLog  However she is checking her blood sugars only very sporadically instead of several times a day as recommended  Even though her insulin had been reduced about 2 weeks ago she did have a low sugar waking up on 9/11 early morning  Since then blood sugars have not been low but has only occasional blood sugar either morning or afternoon before lunch  Has no readings after supper  She thinks she is taking her insulin doses before eating consistently as directed  Her appetite has been fairly good and her weight is coming up She was seen by diabetes educator in 9/19       Side effects from medications have been: None  Compliance with the medical regimen: Fair   Typical meal intake: Breakfast is cereal or oatmeal, sometimes eggs and toast.  Lunch may be rice and beans or a sandwich Fruit for snacks               Exercise:  A little walking at  times  Glucose monitoring:  done <1 times a day         Glucometer: One Touch.       Blood Glucose readings by download   PREMEAL Breakfast Lunch Dinner Bedtime  Overall   Glucose range:  45-152  44-184  121-229    Median:      91    Dietician visit, most recent: None CDE consultation: 07/01/2018  Weight history:  Wt Readings from Last 3 Encounters:  07/14/18 96 lb (43.5 kg)  06/18/18 93 lb 4.8 oz (42.3 kg)  04/13/16 94 lb (42.6 kg)    Glycemic control:   Lab Results  Component Value Date   HGBA1C 10.4 (A) 06/18/2018   Lab Results  Component Value Date   MICROALBUR 8.1 (H) 07/10/2018   LDLCALC 38 07/10/2018   CREATININE 1.66 (H) 07/10/2018   Lab Results  Component Value Date   MICRALBCREAT 11.4 07/10/2018    Lab Results  Component Value Date   FRUCTOSAMINE 299 (H) 07/10/2018    Lab on 07/10/2018  Component Date Value Ref Range Status  . Microalb, Ur 07/10/2018 8.1* 0.0 - 1.9 mg/dL Final  . Creatinine,U 07/10/2018 70.7  mg/dL Final  . Microalb Creat Ratio 07/10/2018 11.4  0.0 - 30.0 mg/g Final  . Cholesterol  07/10/2018 98  0 - 200 mg/dL Final   ATP III Classification       Desirable:  < 200 mg/dL               Borderline High:  200 - 239 mg/dL          High:  > = 240 mg/dL  . Triglycerides 07/10/2018 52.0  0.0 - 149.0 mg/dL Final   Normal:  <150 mg/dLBorderline High:  150 - 199 mg/dL  . HDL 07/10/2018 50.00  >39.00 mg/dL Final  . VLDL 07/10/2018 10.4  0.0 - 40.0 mg/dL Final  . LDL Cholesterol 07/10/2018 38  0 - 99 mg/dL Final  . Total CHOL/HDL Ratio 07/10/2018 2   Final                  Men          Women1/2 Average Risk     3.4          3.3Average Risk          5.0          4.42X Average Risk          9.6          7.13X Average Risk          15.0          11.0                      . NonHDL 07/10/2018 48.09   Final   NOTE:  Non-HDL goal should be 30 mg/dL higher than patient's LDL goal (i.e. LDL goal of < 70 mg/dL, would have non-HDL goal of < 100 mg/dL)  .  Fructosamine 07/10/2018 299* 0 - 285 umol/L Final   Comment: Published reference interval for apparently healthy subjects between age 71 and 44 is 37 - 285 umol/L and in a poorly controlled diabetic population is 228 - 563 umol/L with a mean of 396 umol/L.   Marland Kitchen Sodium 07/10/2018 139  135 - 145 mEq/L Final  . Potassium 07/10/2018 4.2  3.5 - 5.1 mEq/L Final  . Chloride 07/10/2018 106  96 - 112 mEq/L Final  . CO2 07/10/2018 24  19 - 32 mEq/L Final  . Glucose, Bld 07/10/2018 135* 70 - 99 mg/dL Final  . BUN 07/10/2018 31* 6 - 23 mg/dL Final  . Creatinine, Ser 07/10/2018 1.66* 0.40 - 1.20 mg/dL Final  . Calcium 07/10/2018 9.8  8.4 - 10.5 mg/dL Final  . GFR 07/10/2018 39.13* >60.00 mL/min Final    Allergies as of 07/14/2018   No Known Allergies     Medication List        Accurate as of 07/14/18 11:59 PM. Always use your most recent med list.          chlorthalidone 25 MG tablet Commonly known as:  HYGROTON Take 25 mg by mouth daily.   glucose blood test strip Check sugar 3 times daily   HYDROcodone-homatropine 5-1.5 MG/5ML syrup Commonly known as:  HYCODAN 28m by mouth a bedtime as needed for cough.   insulin aspart 100 UNIT/ML injection Commonly known as:  novoLOG Inject 20-25 Units into the skin See admin instructions. Use 25 units before breakfast and supper then use 20 units before lunch   insulin glargine 100 UNIT/ML injection Commonly known as:  LANTUS Inject 32 Units into the skin 2 (two) times daily.   lisinopril 40 MG tablet Commonly known as:  PRINIVIL,ZESTRIL Take 40  mg by mouth daily.   metFORMIN 500 MG tablet Commonly known as:  GLUCOPHAGE Take 1,000 mg by mouth 2 (two) times daily with a meal.   nitrofurantoin (macrocrystal-monohydrate) 100 MG capsule Commonly known as:  MACROBID Take 1 capsule (100 mg total) by mouth 2 (two) times daily.   pioglitazone 15 MG tablet Commonly known as:  ACTOS Take 1 tablet (15 mg total) by mouth daily.   rosuvastatin  40 MG tablet Commonly known as:  CRESTOR Take 1 tablet (40 mg total) by mouth daily.   Travoprost (BAK Free) 0.004 % Soln ophthalmic solution Commonly known as:  TRAVATAN Place 1 drop into both eyes at bedtime.       Allergies: No Known Allergies  Past Medical History:  Diagnosis Date  . Diabetes mellitus without complication ([place])   . Glaucoma   . Hypertension     Past Surgical History:  Procedure Laterality Date  . CESAREAN SECTION    . EYE SURGERY      Family History  Problem Relation Age of Onset  . Hypertension Mother   . Diabetes Brother   . CAD Brother   . Hypertension Brother   . Diabetes Brother   . Hypertension Brother     Social History:  reports that she has never smoked. She has never used smokeless tobacco. She reports that she does not drink alcohol or use drugs.   Review of Systems   Lipid history: Currently on Crestor 40 mg, previously lipids were not controlled with simvastatin 80 mg   Lab Results  Component Value Date   CHOL 98 07/10/2018   HDL 50.00 07/10/2018   LDLCALC 38 07/10/2018   TRIG 52.0 07/10/2018   CHOLHDL 2 07/10/2018           Hypertension: Has been present for several years  BP Readings from Last 3 Encounters:  07/14/18 (!) 148/80  06/26/18 132/66  06/18/18 (!) 148/84     She does not think she has been evaluated for renal dysfunction by her PCP  Lab Results  Component Value Date   CREATININE 1.66 (H) 07/10/2018   CREATININE 1.50 (H) 06/26/2018   CREATININE 1.01 02/13/2014    Most recent eye exam was in 5/19  Most recent foot exam: 8/19  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  LABS:  Lab on 07/10/2018  Component Date Value Ref Range Status  . Microalb, Ur 07/10/2018 8.1* 0.0 - 1.9 mg/dL Final  . Creatinine,U 07/10/2018 70.7  mg/dL Final  . Microalb Creat Ratio 07/10/2018 11.4  0.0 - 30.0 mg/g Final  . Cholesterol 07/10/2018 98  0 - 200 mg/dL Final   ATP III Classification        Desirable:  < 200 mg/dL               Borderline High:  200 - 239 mg/dL          High:  > = 240 mg/dL  . Triglycerides 07/10/2018 52.0  0.0 - 149.0 mg/dL Final   Normal:  <150 mg/dLBorderline High:  150 - 199 mg/dL  . HDL 07/10/2018 50.00  >39.00 mg/dL Final  . VLDL 07/10/2018 10.4  0.0 - 40.0 mg/dL Final  . LDL Cholesterol 07/10/2018 38  0 - 99 mg/dL Final  . Total CHOL/HDL Ratio 07/10/2018 2   Final                  Men          Women1/2 Average  Risk     3.4          3.3Average Risk          5.0          4.42X Average Risk          9.6          7.13X Average Risk          15.0          11.0                      . NonHDL 07/10/2018 48.09   Final   NOTE:  Non-HDL goal should be 30 mg/dL higher than patient's LDL goal (i.e. LDL goal of < 70 mg/dL, would have non-HDL goal of < 100 mg/dL)  . Fructosamine 07/10/2018 299* 0 - 285 umol/L Final   Comment: Published reference interval for apparently healthy subjects between age 5 and 19 is 40 - 285 umol/L and in a poorly controlled diabetic population is 228 - 563 umol/L with a mean of 396 umol/L.   Marland Kitchen Sodium 07/10/2018 139  135 - 145 mEq/L Final  . Potassium 07/10/2018 4.2  3.5 - 5.1 mEq/L Final  . Chloride 07/10/2018 106  96 - 112 mEq/L Final  . CO2 07/10/2018 24  19 - 32 mEq/L Final  . Glucose, Bld 07/10/2018 135* 70 - 99 mg/dL Final  . BUN 07/10/2018 31* 6 - 23 mg/dL Final  . Creatinine, Ser 07/10/2018 1.66* 0.40 - 1.20 mg/dL Final  . Calcium 07/10/2018 9.8  8.4 - 10.5 mg/dL Final  . GFR 07/10/2018 39.13* >60.00 mL/min Final    Physical Examination:  BP (!) 148/80 (BP Location: Left Arm, Patient Position: Sitting, Cuff Size: Normal)   Pulse 93   Ht 4\' 11"  (1.499 m)   Wt 96 lb (43.5 kg)   SpO2 96%   BMI 19.39 kg/m       No pedal edema present    ASSESSMENT:  Diabetes type 2, insulin-dependent and persistently poorly controlled  A1c is last 10.4  Fructosamine is mildly increased at 299  See history of present illness for  detailed discussion of current diabetes management, blood sugar patterns and problems identified  Appears to be having much lower blood sugars with starting Actos Also currently taking only about 1/6 the amount of insulin that she was taking previously Because of her not getting a prescription for the One Touch Verio monitor she has not checked her blood sugars enough and not clear what her blood sugar patterns are especially after meals  She has had several phone calls to adjust her insulin doses because of tendency to hypoglycemia Also has had some diabetes education Discussed balancing out her intake and insulin Also discussed adjusting her Lantus based on her fasting blood sugars and need for consistent monitoring She is generally eating balanced meals     LIPIDS: Her LDL is well below 100 now with using Crestor 40 mg compared to simvastatin  HYPERTENSION: Appears well controlled  RENAL dysfunction: Not clear of etiology, may be from hypertension and needs to discuss with PCP  PLAN:    She will be given the One Touch Verio test strips to be covered by Medicare  Needs to start checking sugars at least once or twice a day after meals and at least every other day in the morning  Continue Actos 15 mg daily  Reduce metformin to 500 mg twice daily  Reduce Lantus to  8 units to avoid potential overnight hypoglycemia, however she needs to take this in the morning rather than randomly either morning or afternoon based on her first meal  She will still take NovoLog with every meal including breakfast which she is not always doing and reduce the dose to 4 units  Discussed that she will need to call us if her blood sugars are consistently over 170 after meals  Reduce Crestor to 40 mg, half tablet daily    Patient Instructions  Check blood sugars on waking up at least every other day  Also check blood sugars about 2 hours after a meal and do this after different meals by  rotation  Recommended blood sugar levels on waking up is 90-130 and about 2 hours after meal is 130-170  Please bring your blood sugar monitor to each visit, thank you  LANTUS insulin: Take 8 units in the morning at the same time daily  If eating breakfast with either toast or oatmeal make sure you take NovoLog  REDUCE NOVOLOG to 4 units before every meal However may need more at dinnertime if blood sugars are going up more than 170 after eating  Reduce ROSUVASTATIN cholesterol medicine to half a tablet  Check with Dr. Mancel Bale about kidney function  Metformin dose is 500 mg twice a day    Counseling time on subjects discussed in assessment and plan sections is over 50% of today's 25 minute visit   Elayne Snare 07/15/2018, 10:39 AM   Note: This office note was prepared with Dragon voice recognition system technology. Any transcriptional errors that result from this process are unintentional.

## 2018-07-14 NOTE — Patient Instructions (Addendum)
Check blood sugars on waking up at least every other day  Also check blood sugars about 2 hours after a meal and do this after different meals by rotation  Recommended blood sugar levels on waking up is 90-130 and about 2 hours after meal is 130-170  Please bring your blood sugar monitor to each visit, thank you  LANTUS insulin: Take 8 units in the morning at the same time daily  If eating breakfast with either toast or oatmeal make sure you take NovoLog  REDUCE NOVOLOG to 4 units before every meal However may need more at dinnertime if blood sugars are going up more than 170 after eating  Reduce ROSUVASTATIN cholesterol medicine to half a tablet  Check with Dr. Mancel Bale about kidney function  Metformin dose is 500 mg twice a day

## 2018-07-15 ENCOUNTER — Other Ambulatory Visit: Payer: Self-pay

## 2018-07-15 MED ORDER — GLUCOSE BLOOD VI STRP: ORAL_STRIP | 12 refills | Status: DC

## 2018-07-17 ENCOUNTER — Telehealth: Payer: Self-pay | Admitting: Endocrinology

## 2018-07-17 MED ORDER — GLUCOSE BLOOD VI STRP: ORAL_STRIP | 12 refills | Status: DC

## 2018-07-17 NOTE — Telephone Encounter (Signed)
Sent on 9/24 and sent again today with number of times a day to test and diagnostic code listed each time.

## 2018-07-17 NOTE — Telephone Encounter (Signed)
[facility] ph# [phone] called re: they need a new RX for test strips (Medicare requires the product, diagnostic code, directions, dosage-cannot use as directed). Patient uses one touch Verio

## 2018-09-17 ENCOUNTER — Other Ambulatory Visit (INDEPENDENT_AMBULATORY_CARE_PROVIDER_SITE_OTHER): Payer: Medicare Other

## 2018-09-17 DIAGNOSIS — E1165 Type 2 diabetes mellitus with hyperglycemia: Secondary | ICD-10-CM | POA: Diagnosis not present

## 2018-09-17 DIAGNOSIS — Z794 Long term (current) use of insulin: Secondary | ICD-10-CM

## 2018-09-17 LAB — HEMOGLOBIN A1C: Hgb A1c MFr Bld: 10.5 % — ABNORMAL HIGH (ref 4.6–6.5)

## 2018-09-17 LAB — COMPREHENSIVE METABOLIC PANEL
ALK PHOS: 40 U/L (ref 39–117)
ALT: 8 U/L (ref 0–35)
AST: 13 U/L (ref 0–37)
Albumin: 3.9 g/dL (ref 3.5–5.2)
BUN: 31 mg/dL — ABNORMAL HIGH (ref 6–23)
CALCIUM: 9.7 mg/dL (ref 8.4–10.5)
CO2: 26 mEq/L (ref 19–32)
Chloride: 105 mEq/L (ref 96–112)
Creatinine, Ser: 1.26 mg/dL — ABNORMAL HIGH (ref 0.40–1.20)
GFR: 53.76 mL/min — ABNORMAL LOW (ref >60.00)
Glucose, Bld: 144 mg/dL — ABNORMAL HIGH (ref 70–99)
POTASSIUM: 4.2 meq/L (ref 3.5–5.1)
Sodium: 138 mEq/L (ref 135–145)
TOTAL PROTEIN: 7 g/dL (ref 6.0–8.3)
Total Bilirubin: 0.5 mg/dL (ref 0.2–1.2)

## 2018-09-23 ENCOUNTER — Other Ambulatory Visit: Payer: Self-pay

## 2018-09-23 ENCOUNTER — Encounter: Payer: Self-pay | Admitting: Endocrinology

## 2018-09-23 ENCOUNTER — Ambulatory Visit (INDEPENDENT_AMBULATORY_CARE_PROVIDER_SITE_OTHER): Payer: Medicare Other | Admitting: Endocrinology

## 2018-09-23 VITALS — BP 140/72 | HR 78 | Ht 59.0 in | Wt 91.8 lb

## 2018-09-23 DIAGNOSIS — N289 Disorder of kidney and ureter, unspecified: Secondary | ICD-10-CM

## 2018-09-23 DIAGNOSIS — Z794 Long term (current) use of insulin: Secondary | ICD-10-CM

## 2018-09-23 DIAGNOSIS — E1165 Type 2 diabetes mellitus with hyperglycemia: Secondary | ICD-10-CM | POA: Diagnosis not present

## 2018-09-23 DIAGNOSIS — I10 Essential (primary) hypertension: Secondary | ICD-10-CM

## 2018-09-23 DIAGNOSIS — E7800 Pure hypercholesterolemia, unspecified: Secondary | ICD-10-CM

## 2018-09-23 MED ORDER — INSULIN ASPART 100 UNIT/ML FLEXPEN: 5.0000 [IU] | PEN_INJECTOR | Freq: Three times a day (TID) | SUBCUTANEOUS | 3 refills | Status: DC

## 2018-09-23 MED ORDER — INSULIN GLARGINE 100 UNITS/ML SOLOSTAR PEN: 20.0000 [IU] | PEN_INJECTOR | Freq: Every day | SUBCUTANEOUS | 3 refills | Status: DC

## 2018-09-23 MED ORDER — PIOGLITAZONE HCL 15 MG PO TABS: 15.0000 mg | ORAL_TABLET | Freq: Every day | ORAL | 1 refills | Status: DC

## 2018-09-23 NOTE — Patient Instructions (Addendum)
Take Novolog at supper daily  Check blood sugars on waking up days a week  Also check blood sugars about 2 hours after meals and do this after different meals by rotation  Recommended blood sugar levels on waking up are 90-130 and about 2 hours after meal is 130-160  Please bring your blood sugar monitor to each visit, thank you  Check on pills, restart Pioglitazone

## 2018-09-23 NOTE — Progress Notes (Signed)
Patient ID: Mariah Peterson, female   DOB: July 21, 1946, 72 y.o.   MRN: [id number]          Reason for Appointment: for Type 2 Diabetes  Referring physician: Lorene Dy   History of Present Illness:          Date of diagnosis of type 2 diabetes mellitus:?  1990       Background history:   She was started on insulin about 4 years ago and previously taking metformin and glipizide No detailed history is available and she does not think she has taken any other types of diabetes medications or injections  Recent history:   Most recent A1c is 10.5 and about the same as before which was 10.4  INSULIN regimen TI:WPYKDX 20 units daily, Novolog 5units 1-3 times daily with food       Non-insulin hypoglycemic drugs the patient is taking are: Metformin 500 mg twice daily  Current management, blood sugar patterns and problems identified:  Previously her sugars were improved with starting Actos but it appears that she is slightly not taking this  She is very inconsistent in her history about what she is taking and likely not taking her insulin doses consistently  She has been on a insulin syringe for the injection and not clear if this is preventing her from doing her insulin consistently  She is reporting taking NovoLog only at suppertime  However she thinks her blood sugar of 400+ on Sunday night was from eating dessert but not from skipping her dose  Again her weight is fluctuating and is down from the last visit  She has only a couple of other readings, not clear if these were before or after breakfast but fasting readings appear to be mildly increased including on the lab work  No hypoglycemia reported  She is eating significant amount of carbohydrate at breakfast but does not take any NovoLog at this time She was seen by diabetes educator in 9/19       Side effects from medications have been: None  Compliance with the medical regimen: Fair   Typical meal intake: Breakfast is  cereal or oatmeal, sometimes eggs and toast.  Lunch may be rice and beans or a sandwich Fruit for snacks               Exercise:  A little walking at times  Glucose monitoring:  done <1 times a day         Glucometer: One Touch.       Blood Glucose readings by download  She has only 3 readings Morning readings 164, 169, evening 404 the bottom  Dietician visit, most recent: None CDE consultation: 07/01/2018  Weight history:  Wt Readings from Last 3 Encounters:  09/23/18 91 lb 12.8 oz (41.6 kg)  07/14/18 96 lb (43.5 kg)  06/18/18 93 lb 4.8 oz (42.3 kg)    Glycemic control:   Lab Results  Component Value Date   HGBA1C 10.5 (H) 09/17/2018   HGBA1C 10.4 (A) 06/18/2018   Lab Results  Component Value Date   MICROALBUR 8.1 (H) 07/10/2018   LDLCALC 38 07/10/2018   CREATININE 1.26 (H) 09/17/2018   Lab Results  Component Value Date   MICRALBCREAT 11.4 07/10/2018    Lab Results  Component Value Date   FRUCTOSAMINE 299 (H) 07/10/2018    Lab on 09/17/2018  Component Date Value Ref Range Status  . Sodium 09/17/2018 138  135 - 145 mEq/L Final  . Potassium 09/17/2018  4.2  3.5 - 5.1 mEq/L Final  . Chloride 09/17/2018 105  96 - 112 mEq/L Final  . CO2 09/17/2018 26  19 - 32 mEq/L Final  . Glucose, Bld 09/17/2018 144* 70 - 99 mg/dL Final  . BUN 09/17/2018 31* 6 - 23 mg/dL Final  . Creatinine, Ser 09/17/2018 1.26* 0.40 - 1.20 mg/dL Final  . Total Bilirubin 09/17/2018 0.5  0.2 - 1.2 mg/dL Final  . Alkaline Phosphatase 09/17/2018 40  39 - 117 U/L Final  . AST 09/17/2018 13  0 - 37 U/L Final  . ALT 09/17/2018 8  0 - 35 U/L Final  . Total Protein 09/17/2018 7.0  6.0 - 8.3 g/dL Final  . Albumin 09/17/2018 3.9  3.5 - 5.2 g/dL Final  . Calcium 09/17/2018 9.7  8.4 - 10.5 mg/dL Final  . GFR 09/17/2018 53.76* >60.00 mL/min Final  . Hgb A1c MFr Bld 09/17/2018 10.5* 4.6 - 6.5 % Final   Glycemic Control Guidelines for People with Diabetes:Non Diabetic:  <6%Goal of Therapy: <7%Additional  Action Suggested:  >8%     Allergies as of 09/23/2018   No Known Allergies     Medication List        Accurate as of 09/23/18  9:08 AM. Always use your most recent med list.          chlorthalidone 25 MG tablet Commonly known as:  HYGROTON Take 25 mg by mouth daily.   glucose blood test strip Use as instructed to test blood sugar 3 times daily E11.65   insulin aspart 100 UNIT/ML injection Commonly known as:  novoLOG Inject 10 Units into the skin 3 (three) times daily with meals. INJECT 10 UNITS UNDER THE SKIN 3 TIMES DAILY WITH MEALS.   insulin glargine 100 UNIT/ML injection Commonly known as:  LANTUS Inject 20 Units into the skin 2 (two) times daily. INJECT 20 UNITS UNDER THE SKIN 2 TIMES DAILY.   lisinopril 40 MG tablet Commonly known as:  PRINIVIL,ZESTRIL Take 40 mg by mouth daily.   metFORMIN 500 MG tablet Commonly known as:  GLUCOPHAGE Take 1,000 mg by mouth 2 (two) times daily with a meal.   pioglitazone 15 MG tablet Commonly known as:  ACTOS Take 1 tablet (15 mg total) by mouth daily.   rosuvastatin 40 MG tablet Commonly known as:  CRESTOR Take 1 tablet (40 mg total) by mouth daily.   Travoprost (BAK Free) 0.004 % Soln ophthalmic solution Commonly known as:  TRAVATAN Place 1 drop into both eyes at bedtime.       Allergies: No Known Allergies  Past Medical History:  Diagnosis Date  . Diabetes mellitus without complication ([place])   . Glaucoma   . Hypertension     Past Surgical History:  Procedure Laterality Date  . CESAREAN SECTION    . EYE SURGERY      Family History  Problem Relation Age of Onset  . Hypertension Mother   . Diabetes Brother   . CAD Brother   . Hypertension Brother   . Diabetes Brother   . Hypertension Brother     Social History:  reports that she has never smoked. She has never used smokeless tobacco. She reports that she does not drink alcohol or use drugs.   Review of Systems   Lipid history: Currently on  Crestor 40 mg from her PCP, she was told to cut it down to a half a tablet since her LDL was only 38   Lab Results  Component Value  Date   CHOL 98 07/10/2018   HDL 50.00 07/10/2018   LDLCALC 38 07/10/2018   TRIG 52.0 07/10/2018   CHOLHDL 2 07/10/2018           Hypertension: Has been present for several years  BP Readings from Last 3 Encounters:  09/23/18 140/72  07/14/18 (!) 148/80  06/26/18 132/66   Renal function appears to be better, does not think she has been seen by PCP recently  Lab Results  Component Value Date   CREATININE 1.26 (H) 09/17/2018   CREATININE 1.66 (H) 07/10/2018   CREATININE 1.50 (H) 06/26/2018    Most recent eye exam was in 5/19  Most recent foot exam: 8/19  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  LABS:  Lab on 09/17/2018  Component Date Value Ref Range Status  . Sodium 09/17/2018 138  135 - 145 mEq/L Final  . Potassium 09/17/2018 4.2  3.5 - 5.1 mEq/L Final  . Chloride 09/17/2018 105  96 - 112 mEq/L Final  . CO2 09/17/2018 26  19 - 32 mEq/L Final  . Glucose, Bld 09/17/2018 144* 70 - 99 mg/dL Final  . BUN 09/17/2018 31* 6 - 23 mg/dL Final  . Creatinine, Ser 09/17/2018 1.26* 0.40 - 1.20 mg/dL Final  . Total Bilirubin 09/17/2018 0.5  0.2 - 1.2 mg/dL Final  . Alkaline Phosphatase 09/17/2018 40  39 - 117 U/L Final  . AST 09/17/2018 13  0 - 37 U/L Final  . ALT 09/17/2018 8  0 - 35 U/L Final  . Total Protein 09/17/2018 7.0  6.0 - 8.3 g/dL Final  . Albumin 09/17/2018 3.9  3.5 - 5.2 g/dL Final  . Calcium 09/17/2018 9.7  8.4 - 10.5 mg/dL Final  . GFR 09/17/2018 53.76* >60.00 mL/min Final  . Hgb A1c MFr Bld 09/17/2018 10.5* 4.6 - 6.5 % Final   Glycemic Control Guidelines for People with Diabetes:Non Diabetic:  <6%Goal of Therapy: <7%Additional Action Suggested:  >8%     Physical Examination:  BP 140/72 (BP Location: Left Arm, Patient Position: Sitting, Cuff Size: Normal)   Pulse 78   Ht 4\' 11"  (1.499 m)   Wt 91 lb 12.8 oz  (41.6 kg)   SpO2 98%   BMI 18.54 kg/m       No pedal edema present    ASSESSMENT:  Diabetes type 2, insulin-dependent and persistently poorly controlled  A1c is 10.5, previously 10.4  See history of present illness for detailed discussion of current diabetes management, blood sugar patterns and problems identified  He is not checking her sugars, taking her insulin as prescribed and likely not taking Actos lately  Some of her difficulties are from not keeping up with her blood sugar readings Also may not be taking insulin because of using the insulin syringe She does not like to check her sugars on her own Also does not appear to have much family support currently  Weight loss: Her weight is fluctuating and she needs to be seen by her PCP to evaluate this  Renal dysfunction: Her creatinine is improved  PLAN:    She will be explained how to use the insulin pen by the nurse educator  For now we will continue Lantus 20 units once a day  To take 5 units of NovoLog with every meal  Restart Actos and this will be sent to her mail order pharmacy  She will use the flowsheet provided to keep a track of her blood sugars and insulin doses for better compliance  Discussed frequency of checking blood sugars and need to do it at least once a day for consistency  She will need to follow-up with diabetes educator to review her management progress  Discussed blood sugar targets both fasting and after meals  Discussed with the family that she is not eligible for the freestyle libre sensor because of not meeting guidelines for Medicare  Continue metformin unchanged since her renal function appears to be better    There are no Patient Instructions on file for this visit.   Counseling time on subjects discussed in assessment and plan sections is over 50% of today's 25 minute visit   Elayne Snare 09/23/2018, 9:08 AM   Note: This office note was prepared with Dragon voice recognition  system technology. Any transcriptional errors that result from this process are unintentional.

## 2018-09-24 ENCOUNTER — Telehealth: Payer: Self-pay | Admitting: Endocrinology

## 2018-09-24 NOTE — Telephone Encounter (Signed)
Called pharmacy back and pharmacist requested clarification as to whether pt is supposed to be getting the vial of Lantus or solostar pen. Clarification was provided that pt will be starting the insulin pen.

## 2018-09-24 NOTE — Telephone Encounter (Signed)
Pharmacy calling regarding Rx for Lantus  Ph: [phone] ask for Pharmacist

## 2018-09-29 LAB — HM DIABETES EYE EXAM

## 2018-09-30 ENCOUNTER — Encounter: Payer: Medicare Other | Attending: Endocrinology | Admitting: Dietician

## 2018-09-30 ENCOUNTER — Encounter: Payer: Self-pay | Admitting: Dietician

## 2018-09-30 ENCOUNTER — Other Ambulatory Visit: Payer: Self-pay | Admitting: Endocrinology

## 2018-09-30 DIAGNOSIS — Z713 Dietary counseling and surveillance: Secondary | ICD-10-CM | POA: Insufficient documentation

## 2018-09-30 DIAGNOSIS — E1165 Type 2 diabetes mellitus with hyperglycemia: Secondary | ICD-10-CM | POA: Insufficient documentation

## 2018-09-30 DIAGNOSIS — E119 Type 2 diabetes mellitus without complications: Secondary | ICD-10-CM

## 2018-09-30 DIAGNOSIS — Z794 Long term (current) use of insulin: Secondary | ICD-10-CM | POA: Insufficient documentation

## 2018-09-30 MED ORDER — FREESTYLE LIBRE 14 DAY SENSOR MISC: 1.0000 [IU] | 4 refills | Status: DC

## 2018-09-30 MED ORDER — FREESTYLE LIBRE 14 DAY READER DEVI: 1.0000 | Freq: Once | 0 refills | Status: AC

## 2018-09-30 NOTE — Patient Instructions (Addendum)
Change canned milk to fat free or choose 1% milk. Bake, broil, boil, or grill rather than fry. Use less added fat. Breakfast, lunch, dinner daily. Small snack if you are hungry but limit to about 15 grams of carbohydrates. Small amount of protein with each meal and snack. Eat slowly and stop when you are satisfied.  Check your blood sugar as discussed. Stay active most days.  Aim for 30 minutes 5 days per week.  Inquire about [employer] research officer, government at [employer].

## 2018-09-30 NOTE — Progress Notes (Signed)
History includes HTN and glaucoma.   GFR estimated 09/17/18 53 much improved. A1C 10.5%. Medications include Lantus 20 units q HS, Novolog 5 units before each meal, Actos (states that she just started taking this), and Metformin.  She states that she is taking her medication as prescribed. She also states that she is checking her blood sugar 2-3 times per day but daughter reports that this is rare.  She is hypoglycemic unaware.   Discussed preference for Medicare to cover the [place].  Daughter stated that she is willing to buy this for her mother after discussing basic costs.  Prescription request sent to MD. Diet is quite high in fat.  Grandson recently diagnosed with high cholesterol.   Patient eats increased carbs as snacks. Discussed that she should eat small amounts of carbohydrates with her meals as this is when she is taking the Novolog and to limit the amount of carbs with snacks.  Patient lives with her husband and 66 yo grandson.  Her daughter who is here today lives nearby.  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  She does the cooking and shopping.   Diabetes Self-Management Education  Visit Type: First/Initial  Appt. Start Time: 1000 Appt. End Time: 1130  09/30/2018  Mariah Peterson, identified by name and date of birth, is a 72 y.o. female with a diagnosis of Diabetes: Type 2.   ASSESSMENT History includes HTN and glaucoma.   GFR estimated 09/17/18 53 much improved. A1C 10.5%. Medications include Lantus 20 units q HS, Novolog 5 units before each meal, Actos (states that she just started taking this), and Metformin.  She states that she is taking her medication as prescribed. She also states that she is checking her blood sugar 2-3 times per day but daughter reports that this is rare.  She is hypoglycemic unaware.   Discussed preference for Medicare to cover the [place].  Daughter stated that she is willing to buy this for her mother after  discussing basic costs.  Prescription request sent to MD. Diet is quite high in fat.  Grandson recently diagnosed with high cholesterol.   Patient eats increased carbs as snacks. Discussed that she should eat small amounts of carbohydrates with her meals as this is when she is taking the Novolog and to limit the amount of carbs with snacks.  Patient lives with her husband and 34 yo grandson.  Her daughter who is here today lives nearby.  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  She does the cooking and shopping.   Height 4\' 11"  (1.499 m), weight 94 lb (42.6 kg). Body mass index is 18.99 kg/m.  Diabetes Self-Management Education - 09/30/18 1026      Visit Information   Visit Type  First/Initial      Initial Visit   Diabetes Type  Type 2    Are you currently following a meal plan?  No    Are you taking your medications as prescribed?  Yes    Date Diagnosed  1990?      Health Coping   How would you rate your overall health?  Good      Psychosocial Assessment   Patient Belief/Attitude about Diabetes  Motivated to manage diabetes    Self-care barriers  None    Self-management support  Doctor's office    Other persons present  Patient;Family Member   husband and daughter   Patient Concerns  Nutrition/Meal planning;Glycemic Control    Special Needs  None    Preferred Learning Style  No preference indicated    Learning Readiness  Ready    How often do you need to have someone help you when you read instructions, pamphlets, or other written materials from your doctor or pharmacy?  1 - Never    What is the last grade level you completed in school?  12th grade      Pre-Education Assessment   Patient understands the diabetes disease and treatment process.  Needs Review    Patient understands incorporating nutritional management into lifestyle.  Needs Review    Patient undertands incorporating physical activity into lifestyle.  Needs Review    Patient  understands using medications safely.  Needs Review    Patient understands monitoring blood glucose, interpreting and using results  Needs Review    Patient understands prevention, detection, and treatment of acute complications.  Needs Review    Patient understands prevention, detection, and treatment of chronic complications.  Needs Review    Patient understands how to develop strategies to address psychosocial issues.  Needs Review    Patient understands how to develop strategies to promote health/change behavior.  Needs Review      Complications   Last HgB A1C per patient/outside source  10.5 %   09/17/18   How often do you check your blood sugar?  1-2 times/day    Fasting Blood glucose range (mg/dL)  70-129   73-120   Postprandial Blood glucose range (mg/dL)  130-179    Number of hypoglycemic episodes per month  0    Number of hyperglycemic episodes per week  0    Have you had a dilated eye exam in the past 12 months?  Yes    Have you had a dental exam in the past 12 months?  No    Are you checking your feet?  Yes    How many days per week are you checking your feet?  7      Dietary Intake   Breakfast  regular oatmeal, raisins, canned milk, equal, coffee with creamer, equal OR instant grits, liver pudding OR egg, occasional bacon, 1-2 slices bacon    Snack (morning)  fiber one brownie or banana or crackers with peanut butter    Lunch  leftovers OR sandwich, chips    Snack (afternoon)  sugar free cookies    Dinner  fried fish or fried chicken, vegetables OR occasional hamburger, tortilla or potato chips or fries OR spaghetti   5   Snack (evening)  crackers and peanut butter    Beverage(s)  coffee with creamer and equal, water, 1 diet soda per day, occasional juice (1/2 cup)      Exercise   Exercise Type  Light (walking / raking leaves)   plays with 54 yo grandson   How many days per week to you exercise?  4    How many minutes per day do you exercise?  30    Total minutes per  week of exercise  120      Patient Education   Previous Diabetes Education  Yes (please comment)   years ago when first diagnosed   Disease state   Definition of diabetes, type 1 and 2, and the diagnosis of diabetes    Nutrition management   Role of diet in the treatment of diabetes and the relationship between the three main macronutrients and blood glucose level;Food label reading, portion sizes and measuring food.;Meal  options for control of blood glucose level and chronic complications.;Information on hints to eating out and maintain blood glucose control.;Meal timing in regards to the patients' current diabetes medication.    Physical activity and exercise   Role of exercise on diabetes management, blood pressure control and cardiac health.;Helped patient identify appropriate exercises in relation to his/her diabetes, diabetes complications and other health issue.    Medications  Reviewed patients medication for diabetes, action, purpose, timing of dose and side effects.    Monitoring  Purpose and frequency of SMBG.;Daily foot exams;Yearly dilated eye exam;Identified appropriate SMBG and/or A1C goals.    Acute complications  Taught treatment of hypoglycemia - the 15 rule.    Chronic complications  Relationship between chronic complications and blood glucose control    Psychosocial adjustment  Worked with patient to identify barriers to care and solutions;Role of stress on diabetes      Individualized Goals (developed by patient)   Nutrition  General guidelines for healthy choices and portions discussed    Physical Activity  30 minutes per day;Exercise 5-7 days per week    Medications  take my medication as prescribed    Monitoring   test my blood glucose as discussed    Reducing Risk  examine blood glucose patterns;increase portions of healthy fats    Health Coping  discuss diabetes with (comment)   MD, RD, CDE     Post-Education Assessment   Patient understands the diabetes disease and  treatment process.  Demonstrates understanding / competency    Patient understands incorporating nutritional management into lifestyle.  Needs Review    Patient undertands incorporating physical activity into lifestyle.  Demonstrates understanding / competency    Patient understands using medications safely.  Demonstrates understanding / competency    Patient understands monitoring blood glucose, interpreting and using results  Demonstrates understanding / competency    Patient understands prevention, detection, and treatment of acute complications.  Demonstrates understanding / competency    Patient understands prevention, detection, and treatment of chronic complications.  Demonstrates understanding / competency    Patient understands how to develop strategies to address psychosocial issues.  Demonstrates understanding / competency    Patient understands how to develop strategies to promote health/change behavior.  Needs Review      Outcomes   Expected Outcomes  Demonstrated interest in learning. Expect positive outcomes    Future DMSE  PRN    Program Status  Completed       Individualized Plan for Diabetes Self-Management Training:   Learning Objective:  Patient will have a greater understanding of diabetes self-management. Patient education plan is to attend individual and/or group sessions per assessed needs and concerns.   Plan:   Patient Instructions  Change canned milk to fat free or choose 1% milk. Bake, broil, boil, or grill rather than fry. Use less added fat. Breakfast, lunch, dinner daily. Small snack if you are hungry but limit to about 15 grams of carbohydrates. Small amount of protein with each meal and snack. Eat slowly and stop when you are satisfied.  Check your blood sugar as discussed. Stay active most days.  Aim for 30 minutes 5 days per week.  Inquire about [employer] research officer, government at [employer].    Expected Outcomes:  Demonstrated interest in learning. Expect  positive outcomes  Education material provided: ADA Diabetes: Your Take Control Guide, Food label handouts, Meal plan card and Snack sheet, types of fat, eating out  If problems or questions, patient to contact team via:  Phone  Future DSME appointment: PRN

## 2018-10-02 ENCOUNTER — Telehealth: Payer: Self-pay | Admitting: Dietician

## 2018-10-02 NOTE — Telephone Encounter (Signed)
Called and spoke to Mariah Peterson and [name] her daughter.  The FreeStyle [place] prescription has been called into the pharmacy by Dr. Dwyane Dee.  The price has increased but there is a discount by using the singlecare co-pay card available on line.  York Cerise has volunteered to pay for the [place] for her mother. They are to call for further questions.  Antonieta Iba, RD, LDN, CDE

## 2018-10-23 ENCOUNTER — Ambulatory Visit: Payer: Medicare Other | Admitting: Nurse Practitioner

## 2018-10-31 ENCOUNTER — Ambulatory Visit (INDEPENDENT_AMBULATORY_CARE_PROVIDER_SITE_OTHER): Payer: Medicare Other | Admitting: Nurse Practitioner

## 2018-10-31 ENCOUNTER — Encounter: Payer: Self-pay | Admitting: Nurse Practitioner

## 2018-10-31 ENCOUNTER — Other Ambulatory Visit (INDEPENDENT_AMBULATORY_CARE_PROVIDER_SITE_OTHER): Payer: Medicare Other

## 2018-10-31 VITALS — BP 160/80 | HR 74 | Ht 59.0 in | Wt 94.0 lb

## 2018-10-31 DIAGNOSIS — E785 Hyperlipidemia, unspecified: Secondary | ICD-10-CM

## 2018-10-31 DIAGNOSIS — Z794 Long term (current) use of insulin: Secondary | ICD-10-CM

## 2018-10-31 DIAGNOSIS — E1169 Type 2 diabetes mellitus with other specified complication: Secondary | ICD-10-CM | POA: Diagnosis not present

## 2018-10-31 DIAGNOSIS — I10 Essential (primary) hypertension: Secondary | ICD-10-CM

## 2018-10-31 LAB — COMPREHENSIVE METABOLIC PANEL
ALT: 9 U/L (ref 0–35)
AST: 16 U/L (ref 0–37)
Albumin: 4.1 g/dL (ref 3.5–5.2)
Alkaline Phosphatase: 43 U/L (ref 39–117)
BUN: 24 mg/dL — ABNORMAL HIGH (ref 6–23)
CALCIUM: 10.1 mg/dL (ref 8.4–10.5)
CO2: 24 mEq/L (ref 19–32)
Chloride: 106 mEq/L (ref 96–112)
Creatinine, Ser: 1.13 mg/dL (ref 0.40–1.20)
GFR: 60.94 mL/min (ref >60.00)
Glucose, Bld: 106 mg/dL — ABNORMAL HIGH (ref 70–99)
Potassium: 4.3 mEq/L (ref 3.5–5.1)
Sodium: 139 mEq/L (ref 135–145)
Total Bilirubin: 0.4 mg/dL (ref 0.2–1.2)
Total Protein: 7 g/dL (ref 6.0–8.3)

## 2018-10-31 LAB — CBC
HCT: 32.1 % — ABNORMAL LOW (ref 36.0–46.0)
Hemoglobin: 10.8 g/dL — ABNORMAL LOW (ref 12.0–15.0)
MCHC: 33.6 g/dL (ref 30.0–36.0)
MCV: 91.7 fl (ref 78.0–100.0)
Platelets: 225 10*3/uL (ref 150.0–400.0)
RBC: 3.5 Mil/uL — ABNORMAL LOW (ref 3.87–5.11)
RDW: 14.3 % (ref 11.5–15.5)
WBC: 4.7 10*3/uL (ref 4.0–10.5)

## 2018-10-31 NOTE — Patient Instructions (Addendum)
Resume your daily blood pressure medication  Return to see me in about 1 month, we can recheck your blood pressure and catch up on annual health needs  If you have medicare related insurance (such as traditional Medicare, [employer], [employer], or similar), Please make an appointment at the scheduling desk with Sharee Pimple, the [employer], for your Wellness visit in this office, which is a benefit with your insurance.   DASH Eating Plan DASH stands for "Dietary Approaches to Stop Hypertension." The DASH eating plan is a healthy eating plan that has been shown to reduce high blood pressure (hypertension). It may also reduce your risk for type 2 diabetes, heart disease, and stroke. The DASH eating plan may also help with weight loss. What are tips for following this plan?  General guidelines  Avoid eating more than 2,300 mg (milligrams) of salt (sodium) a day. If you have hypertension, you may need to reduce your sodium intake to 1,500 mg a day.  Limit alcohol intake to no more than 1 drink a day for nonpregnant women and 2 drinks a day for men. One drink equals 12 oz of beer, 5 oz of wine, or 1 oz of hard liquor.  Work with your health care provider to maintain a healthy body weight or to lose weight. Ask what an ideal weight is for you.  Get at least 30 minutes of exercise that causes your heart to beat faster (aerobic exercise) most days of the week. Activities may include walking, swimming, or biking.  Work with your health care provider or diet and nutrition specialist (dietitian) to adjust your eating plan to your individual calorie needs. Reading food labels   Check food labels for the amount of sodium per serving. Choose foods with less than 5 percent of the Daily Value of sodium. Generally, foods with less than 300 mg of sodium per serving fit into this eating plan.  To find whole grains, look for the word "whole" as the first word in the ingredient  list. Shopping  Buy products labeled as "low-sodium" or "no salt added."  Buy fresh foods. Avoid canned foods and premade or frozen meals. Cooking  Avoid adding salt when cooking. Use salt-free seasonings or herbs instead of table salt or sea salt. Check with your health care provider or pharmacist before using salt substitutes.  Do not fry foods. Cook foods using healthy methods such as baking, boiling, grilling, and broiling instead.  Cook with heart-healthy oils, such as olive, canola, soybean, or sunflower oil. Meal planning  Eat a balanced diet that includes: ? 5 or more servings of fruits and vegetables each day. At each meal, try to fill half of your plate with fruits and vegetables. ? Up to 6-8 servings of whole grains each day. ? Less than 6 oz of lean meat, poultry, or fish each day. A 3-oz serving of meat is about the same size as a deck of cards. One egg equals 1 oz. ? 2 servings of low-fat dairy each day. ? A serving of nuts, seeds, or beans 5 times each week. ? Heart-healthy fats. Healthy fats called Omega-3 fatty acids are found in foods such as flaxseeds and coldwater fish, like sardines, salmon, and mackerel.  Limit how much you eat of the following: ? Canned or prepackaged foods. ? Food that is high in trans fat, such as fried foods. ? Food that is high in saturated fat, such as fatty meat. ? Sweets, desserts, sugary drinks, and  other foods with added sugar. ? Full-fat dairy products.  Do not salt foods before eating.  Try to eat at least 2 vegetarian meals each week.  Eat more home-cooked food and less restaurant, buffet, and fast food.  When eating at a restaurant, ask that your food be prepared with less salt or no salt, if possible. What foods are recommended? The items listed may not be a complete list. Talk with your dietitian about what dietary choices are best for you. Grains Whole-grain or whole-wheat bread. Whole-grain or whole-wheat pasta. Brown  rice. [place]. Bulgur. Whole-grain and low-sodium cereals. Pita bread. Low-fat, low-sodium crackers. Whole-wheat flour tortillas. Vegetables Fresh or frozen vegetables (raw, steamed, roasted, or grilled). Low-sodium or reduced-sodium tomato and vegetable juice. Low-sodium or reduced-sodium tomato sauce and tomato paste. Low-sodium or reduced-sodium canned vegetables. Fruits All fresh, dried, or frozen fruit. Canned fruit in natural juice (without added sugar). Meat and other protein foods Skinless chicken or [place]. Ground chicken or [place]. Pork with fat trimmed off. Fish and seafood. Egg whites. Dried beans, peas, or lentils. Unsalted nuts, nut butters, and seeds. Unsalted canned beans. Lean cuts of beef with fat trimmed off. Low-sodium, lean deli meat. Dairy Low-fat (1%) or fat-free (skim) milk. Fat-free, low-fat, or reduced-fat cheeses. Nonfat, low-sodium ricotta or cottage cheese. Low-fat or nonfat yogurt. Low-fat, low-sodium cheese. Fats and oils Soft margarine without trans fats. Vegetable oil. Low-fat, reduced-fat, or light mayonnaise and salad dressings (reduced-sodium). Canola, safflower, olive, soybean, and sunflower oils. Avocado. Seasoning and other foods Herbs. Spices. Seasoning mixes without salt. Unsalted popcorn and pretzels. Fat-free sweets. What foods are not recommended? The items listed may not be a complete list. Talk with your dietitian about what dietary choices are best for you. Grains Baked goods made with fat, such as croissants, muffins, or some breads. Dry pasta or rice meal packs. Vegetables Creamed or fried vegetables. Vegetables in a cheese sauce. Regular canned vegetables (not low-sodium or reduced-sodium). Regular canned tomato sauce and paste (not low-sodium or reduced-sodium). Regular tomato and vegetable juice (not low-sodium or reduced-sodium). Angie Fava. Olives. Fruits Canned fruit in a light or heavy syrup. Fried fruit. Fruit in cream or butter  sauce. Meat and other protein foods Fatty cuts of meat. Ribs. Fried meat. Berniece Salines. Sausage. Bologna and other processed lunch meats. Salami. Fatback. Hotdogs. Bratwurst. Salted nuts and seeds. Canned beans with added salt. Canned or smoked fish. Whole eggs or egg yolks. Chicken or [place] with skin. Dairy Whole or 2% milk, cream, and half-and-half. Whole or full-fat cream cheese. Whole-fat or sweetened yogurt. Full-fat cheese. Nondairy creamers. Whipped toppings. Processed cheese and cheese spreads. Fats and oils Butter. Stick margarine. Lard. Shortening. Ghee. Bacon fat. Tropical oils, such as coconut, palm kernel, or palm oil. Seasoning and other foods Salted popcorn and pretzels. Onion salt, garlic salt, seasoned salt, table salt, and sea salt. Worcestershire sauce. Tartar sauce. Barbecue sauce. Teriyaki sauce. Soy sauce, including reduced-sodium. Steak sauce. Canned and packaged gravies. Fish sauce. Oyster sauce. Cocktail sauce. Horseradish that you find on the shelf. Ketchup. Mustard. Meat flavorings and tenderizers. Bouillon cubes. Hot sauce and Tabasco sauce. Premade or packaged marinades. Premade or packaged taco seasonings. Relishes. Regular salad dressings. Where to find more information:  National Heart, Lung, and [place]: https://wilson-eaton.com/  American Heart Association: www.heart.org Summary  The DASH eating plan is a healthy eating plan that has been shown to reduce high blood pressure (hypertension). It may also reduce your risk for type 2 diabetes, heart disease, and stroke.  With the DASH eating plan, you should limit salt (sodium) intake to 2,300 mg a day. If you have hypertension, you may need to reduce your sodium intake to 1,500 mg a day.  When on the DASH eating plan, aim to eat more fresh fruits and vegetables, whole grains, lean proteins, low-fat dairy, and heart-healthy fats.  Work with your health care provider or diet and nutrition specialist (dietitian) to adjust  your eating plan to your individual calorie needs. This information is not intended to replace advice given to you by your health care provider. Make sure you discuss any questions you have with your health care provider. Document Released: 09/27/2011 Document Revised: 10/01/2016 Document Reviewed: 10/01/2016 Elsevier Interactive Patient Education  2019 [employer].

## 2018-10-31 NOTE — Assessment & Plan Note (Signed)
Ophthalmology Records requested for chart Continue planned follow up with endocrinology - CBC; Future - Comprehensive metabolic panel; Future

## 2018-10-31 NOTE — Progress Notes (Signed)
Mariah Peterson is a 73 y.o. female with the following history as recorded in Epic[facility]:  Patient Active Problem List   Diagnosis Date Noted  . Other and unspecified hyperlipidemia 08/04/2013  . Type II or unspecified type diabetes mellitus without mention of complication, not stated as uncontrolled 08/04/2013    Current Outpatient Medications  Medication Sig Dispense Refill  . chlorthalidone (HYGROTON) 25 MG tablet Take 25 mg by mouth daily.    . Continuous Blood Gluc Sensor (FREESTYLE LIBRE 14 DAY SENSOR) MISC 1 Units by Does not apply route every 14 (fourteen) days. 2 each 4  . glucose blood test strip Use as instructed to test blood sugar 3 times daily E11.65 100 each 12  . insulin aspart (NOVOLOG) 100 UNIT/ML FlexPen Inject 5 Units into the skin 3 (three) times daily with meals. INJECT 5 UNITS UNDER THE SKIN THREE TIMES DAILY WITH MEALS. 2 pen 3  . insulin glargine (LANTUS) 100 unit/mL SOPN Inject 0.2 mLs (20 Units total) into the skin daily. INJECT 20 UNITS UNDER THE SKIN ONCE DAILY. DX:E11.65 2 pen 3  . lisinopril (PRINIVIL,ZESTRIL) 40 MG tablet Take 40 mg by mouth daily.    . metFORMIN (GLUCOPHAGE) 500 MG tablet Take 1,000 mg by mouth 2 (two) times daily with a meal.     . pioglitazone (ACTOS) 15 MG tablet Take 1 tablet (15 mg total) by mouth daily. 90 tablet 1  . rosuvastatin (CRESTOR) 40 MG tablet Take 1 tablet (40 mg total) by mouth daily. 30 tablet 3  . Travoprost, BAK Free, (TRAVATAN) 0.004 % SOLN ophthalmic solution Place 1 drop into both eyes at bedtime.      No current facility-administered medications for this visit.     Allergies: Patient has no known allergies.  Past Medical History:  Diagnosis Date  . Diabetes mellitus without complication ([place])   . Glaucoma   . History of chicken pox   . Hypertension     Past Surgical History:  Procedure Laterality Date  . CESAREAN SECTION    . EYE SURGERY      Family History  Problem Relation Age of Onset  . Hypertension  Mother   . Diabetes Brother   . CAD Brother   . Hypertension Brother   . Diabetes Brother   . Hypertension Brother     Social History   Tobacco Use  . Smoking status: Never Smoker  . Smokeless tobacco: Never Used  Substance Use Topics  . Alcohol use: No     Subjective:  Mariah Peterson is here today to establish care as a new patient to our practice, transferring from prior provider due to wanting a change. 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 Aside from primary care, she is routinely followed by endocrinology for management of diabetes, HLD, says she is scheduled to see endocrinology again next week; retinal specialist for diabetic eye care; ophthalmology for regular eye exams; and recently started seeing nutritionist for dietary education. We will review current daily medications today,she is without complaints  No LMP recorded. Patient is postmenopausal.  Hypertension -maintained on chlorthalidone 25, lisinopril 40 daily, she has been on both of  These medications for some time now and reports she usually takes daily without noted adverse effects, however did not take the medications today . Does not check BP at home  Denies syncope, headaches, vision changes, chest pain, shortness of breath, edema.  BP Readings from Last 3 Encounters:  10/31/18 (!) 170/80  09/23/18 140/72  07/14/18 (!) 148/80   HLD- maintained on rosuvastatin 40 Reports daily medication compliance without noted adverse medication effects including myalgias.  Lab Results  Component Value Date   CHOL 98 07/10/2018   HDL 50.00 07/10/2018   LDLCALC 38 07/10/2018   TRIG 52.0 07/10/2018   CHOLHDL 2 07/10/2018     ROS- See HPI  Objective:  Vitals:   10/31/18 0839  BP: (!) 170/80  Pulse: 74  SpO2: 99%  Weight: 94 lb (42.6 kg)  Height: 4\' 11"  (1.499 m)  Body mass index is 18.99 kg/m.  General: Well developed, well nourished, in no acute distress  Skin : Warm and dry.  Head:  Normocephalic and atraumatic  Eyes: Sclera and conjunctiva clear; pupils round and reactive to light; extraocular movements intact  Oropharynx: Pink, supple. No suspicious lesions  Neck: Supple Lungs: Respirations unlabored; clear to auscultation bilaterally without wheeze, rales, rhonchi  CVS exam: normal rate and regular rhythm, S1 and S2 normal.  Extremities: No edema, cyanosis Vessels: Symmetric bilaterally  Neurologic: Alert and oriented; speech intact; face symmetrical; moves all extremities well; CNII-XII intact without focal deficit  Psychiatric: Normal mood and affect.  Assessment:  1. Hypertension, unspecified type   2. Type 2 diabetes mellitus with other specified complication, with long-term current use of insulin ([place])   3. Hyperlipidemia, unspecified hyperlipidemia type     Plan:   Recommended AWV with Sharee Pimple Return in about 1 month (around 12/01/2018) for BP recheck, Health maintenance.  Orders Placed This Encounter  Procedures  . CBC    Standing Status:   Future    Number of Occurrences:   1    Standing Expiration Date:   11/01/2019  . Comprehensive metabolic panel    Standing Status:   Future    Number of Occurrences:   1    Standing Expiration Date:   11/01/2019    Requested Prescriptions    No prescriptions requested or ordered in this encounter

## 2018-10-31 NOTE — Assessment & Plan Note (Signed)
BP elevated off medications Discussed the role of healthy diet, exercise, daily medication compliance in the management of HTN, she was instructed to resume daily medications as prescribed Home management, DASH diet, red flags and return precautions including when to seek immediate care discussed and printed on AVS RTC in 1 month for BP recheck - CBC; Future - Comprehensive metabolic panel; Future

## 2018-10-31 NOTE — Assessment & Plan Note (Addendum)
Continue statin Lipid panel up to date, WNL 07/10/18 - CBC; Future - Comprehensive metabolic panel; Future

## 2018-11-03 ENCOUNTER — Other Ambulatory Visit (INDEPENDENT_AMBULATORY_CARE_PROVIDER_SITE_OTHER): Payer: Medicare Other

## 2018-11-03 ENCOUNTER — Encounter: Payer: Self-pay | Admitting: Nurse Practitioner

## 2018-11-03 DIAGNOSIS — Z794 Long term (current) use of insulin: Secondary | ICD-10-CM

## 2018-11-03 DIAGNOSIS — E7800 Pure hypercholesterolemia, unspecified: Secondary | ICD-10-CM

## 2018-11-03 DIAGNOSIS — E1165 Type 2 diabetes mellitus with hyperglycemia: Secondary | ICD-10-CM | POA: Diagnosis not present

## 2018-11-03 LAB — BASIC METABOLIC PANEL
BUN: 24 mg/dL — ABNORMAL HIGH (ref 6–23)
CO2: 27 mEq/L (ref 19–32)
Calcium: 9.9 mg/dL (ref 8.4–10.5)
Chloride: 106 mEq/L (ref 96–112)
Creatinine, Ser: 1.2 mg/dL (ref 0.40–1.20)
GFR: 56.85 mL/min — ABNORMAL LOW (ref >60.00)
GLUCOSE: 143 mg/dL — AB (ref 70–99)
Potassium: 4.8 mEq/L (ref 3.5–5.1)
SODIUM: 138 meq/L (ref 135–145)

## 2018-11-03 LAB — LIPID PANEL
Cholesterol: 262 mg/dL — ABNORMAL HIGH (ref 0–200)
HDL: 60.1 mg/dL (ref >39.00)
LDL Cholesterol: 183 mg/dL — ABNORMAL HIGH (ref 0–99)
NonHDL: 201.59
Total CHOL/HDL Ratio: 4
Triglycerides: 94 mg/dL (ref 0.0–149.0)
VLDL: 18.8 mg/dL (ref 0.0–40.0)

## 2018-11-04 ENCOUNTER — Other Ambulatory Visit (INDEPENDENT_AMBULATORY_CARE_PROVIDER_SITE_OTHER): Payer: Medicare Other

## 2018-11-04 DIAGNOSIS — D649 Anemia, unspecified: Secondary | ICD-10-CM | POA: Diagnosis not present

## 2018-11-04 LAB — FRUCTOSAMINE: Fructosamine: 369 umol/L — ABNORMAL HIGH (ref 0–285)

## 2018-11-04 LAB — IBC PANEL
Iron: 79 ug/dL (ref 42–145)
Saturation Ratios: 24.5 % (ref 20.0–50.0)
Transferrin: 230 mg/dL (ref 212.0–360.0)

## 2018-11-04 LAB — FERRITIN: FERRITIN: 91 ng/mL (ref 10.0–291.0)

## 2018-11-05 ENCOUNTER — Ambulatory Visit (INDEPENDENT_AMBULATORY_CARE_PROVIDER_SITE_OTHER): Payer: Medicare Other | Admitting: Endocrinology

## 2018-11-05 ENCOUNTER — Encounter: Payer: Self-pay | Admitting: Endocrinology

## 2018-11-05 VITALS — BP 160/70 | HR 83 | Ht 59.0 in | Wt 96.2 lb

## 2018-11-05 DIAGNOSIS — E1165 Type 2 diabetes mellitus with hyperglycemia: Secondary | ICD-10-CM | POA: Diagnosis not present

## 2018-11-05 DIAGNOSIS — Z794 Long term (current) use of insulin: Secondary | ICD-10-CM

## 2018-11-05 DIAGNOSIS — E785 Hyperlipidemia, unspecified: Secondary | ICD-10-CM | POA: Diagnosis not present

## 2018-11-05 MED ORDER — ROSUVASTATIN CALCIUM 40 MG PO TABS: 40.0000 mg | ORAL_TABLET | Freq: Every day | ORAL | 1 refills | Status: DC

## 2018-11-05 NOTE — Patient Instructions (Addendum)
LANTUS 18 UNITS DAILY AND CHANGE TO AM WITH NOVOLOG starting on 1/16  NOVOLOG 4 IN AM AND 6 AT LUNCH AND DINNER  Refill [email or web address]

## 2018-11-05 NOTE — Progress Notes (Signed)
Patient ID: Mariah Peterson, female   DOB: Nov 24, 1945, 73 y.o.   MRN: [id number]          Reason for Appointment: for Type 2 Diabetes  History of Present Illness:          Date of diagnosis of type 2 diabetes mellitus:?  1990       Background history:   She was started on insulin about 4 years ago and previously taking metformin and glipizide No detailed history is available and she does not think she has taken any other types of diabetes medications or injections  Recent history:   Most recent A1c is 10.5 and about the same as before which was 10.4 Fructosamine is 369  INSULIN regimen UV:OZDGUY 20 units daily, Novolog 5 units daily with food       Non-insulin hypoglycemic drugs the patient is taking are: Metformin 500 mg twice daily  Current management, blood sugar patterns and problems identified:  Her daughter has been helping her with her diabetes management but was not available today in the office  She has started using the freestyle libre sensor on her own  Although her blood sugars had been markedly increased at times previously they are generally better now  She is using the freestyle libre sensor but not consistently and may not always check it more than twice a day  Also the readings may be slightly lower when comparing her home readings to the lab glucose the same time on Monday  Also her overnight blood sugar tracing may indicate inconsistent contact  She was told to make sure she takes NovoLog with every meal and she thinks she is doing so however occasionally will have significantly high readings after a meal, previously had been going off diet at times with eating sweets also  Generally blood sugars are better after breakfast  HIGHEST blood sugars are 6 PM and 10 PM  She was seen by diabetes educator in 9/19       Side effects from medications have been: None  Compliance with the medical regimen: Fair  Typical meal intake: Breakfast is cereal or oatmeal,  otherwise eggs and toast.  Lunch may be rice and beans or a sandwich Fruit for snacks               Exercise:  A little walking at times  Glucose monitoring:  done <1 times a day         Glucometer: One Touch.       Blood Glucose readings by download  CGM use % of time  61  2-week average/SD  127+/-49  Time in range    75    %  % Time Above 180  17  % Time above 250   % Time Below 70  8     PRE-MEAL Fasting Lunch Dinner Bedtime Overall  Glucose range:       Averages:  115  150    127   POST-MEAL PC Breakfast PC Lunch PC Dinner  Glucose range:     Averages:  153  167  198     Dietician visit, most recent: None CDE consultation: 07/01/2018  Weight history:  Wt Readings from Last 3 Encounters:  11/05/18 96 lb 3.2 oz (43.6 kg)  10/31/18 94 lb (42.6 kg)  09/30/18 94 lb (42.6 kg)    Glycemic control:   Lab Results  Component Value Date   HGBA1C 10.5 (H) 09/17/2018   HGBA1C 10.4 (A) 06/18/2018  Lab Results  Component Value Date   MICROALBUR 8.1 (H) 07/10/2018   LDLCALC 183 (H) 11/03/2018   CREATININE 1.20 11/03/2018   Lab Results  Component Value Date   MICRALBCREAT 11.4 07/10/2018    Lab Results  Component Value Date   FRUCTOSAMINE 369 (H) 11/03/2018   FRUCTOSAMINE 299 (H) 07/10/2018    Lab on 11/04/2018  Component Date Value Ref Range Status  . Ferritin 11/04/2018 91.0  10.0 - 291.0 ng/mL Final  . Iron 11/04/2018 79  42 - 145 ug/dL Final  . Transferrin 11/04/2018 230.0  212.0 - 360.0 mg/dL Final  . Saturation Ratios 11/04/2018 24.5  20.0 - 50.0 % Final  Lab on 11/03/2018  Component Date Value Ref Range Status  . Cholesterol 11/03/2018 262* 0 - 200 mg/dL Final   ATP III Classification       Desirable:  < 200 mg/dL               Borderline High:  200 - 239 mg/dL          High:  > = 240 mg/dL  . Triglycerides 11/03/2018 94.0  0.0 - 149.0 mg/dL Final   Normal:  <150 mg/dLBorderline High:  150 - 199 mg/dL  . HDL 11/03/2018 60.10  >39.00 mg/dL Final  .  VLDL 11/03/2018 18.8  0.0 - 40.0 mg/dL Final  . LDL Cholesterol 11/03/2018 183* 0 - 99 mg/dL Final  . Total CHOL/HDL Ratio 11/03/2018 4   Final                  Men          Women1/2 Average Risk     3.4          3.3Average Risk          5.0          4.42X Average Risk          9.6          7.13X Average Risk          15.0          11.0                      . NonHDL 11/03/2018 201.59   Final   NOTE:  Non-HDL goal should be 30 mg/dL higher than patient's LDL goal (i.e. LDL goal of < 70 mg/dL, would have non-HDL goal of < 100 mg/dL)  . Fructosamine 11/03/2018 369* 0 - 285 umol/L Final   Comment: Published reference interval for apparently healthy subjects between age 26 and 66 is 90 - 285 umol/L and in a poorly controlled diabetic population is 228 - 563 umol/L with a mean of 396 umol/L.   Marland Kitchen Sodium 11/03/2018 138  135 - 145 mEq/L Final  . Potassium 11/03/2018 4.8  3.5 - 5.1 mEq/L Final  . Chloride 11/03/2018 106  96 - 112 mEq/L Final  . CO2 11/03/2018 27  19 - 32 mEq/L Final  . Glucose, Bld 11/03/2018 143* 70 - 99 mg/dL Final  . BUN 11/03/2018 24* 6 - 23 mg/dL Final  . Creatinine, Ser 11/03/2018 1.20  0.40 - 1.20 mg/dL Final  . Calcium 11/03/2018 9.9  8.4 - 10.5 mg/dL Final  . GFR 11/03/2018 56.85* >60.00 mL/min Final  Abstract on 11/03/2018  Component Date Value Ref Range Status  . HM Diabetic Eye Exam 09/29/2018 No Retinopathy  No Retinopathy Final  Appointment on 10/31/2018  Component Date Value Ref Range  Status  . Sodium 10/31/2018 139  135 - 145 mEq/L Final  . Potassium 10/31/2018 4.3  3.5 - 5.1 mEq/L Final  . Chloride 10/31/2018 106  96 - 112 mEq/L Final  . CO2 10/31/2018 24  19 - 32 mEq/L Final  . Glucose, Bld 10/31/2018 106* 70 - 99 mg/dL Final  . BUN 10/31/2018 24* 6 - 23 mg/dL Final  . Creatinine, Ser 10/31/2018 1.13  0.40 - 1.20 mg/dL Final  . Total Bilirubin 10/31/2018 0.4  0.2 - 1.2 mg/dL Final  . Alkaline Phosphatase 10/31/2018 43  39 - 117 U/L Final  . AST 10/31/2018  16  0 - 37 U/L Final  . ALT 10/31/2018 9  0 - 35 U/L Final  . Total Protein 10/31/2018 7.0  6.0 - 8.3 g/dL Final  . Albumin 10/31/2018 4.1  3.5 - 5.2 g/dL Final  . Calcium 10/31/2018 10.1  8.4 - 10.5 mg/dL Final  . GFR 10/31/2018 60.94  >60.00 mL/min Final  . WBC 10/31/2018 4.7  4.0 - 10.5 K/uL Final  . RBC 10/31/2018 3.50* 3.87 - 5.11 Mil/uL Final  . Platelets 10/31/2018 225.0  150.0 - 400.0 K/uL Final  . Hemoglobin 10/31/2018 10.8* 12.0 - 15.0 g/dL Final  . HCT 10/31/2018 32.1* 36.0 - 46.0 % Final  . MCV 10/31/2018 91.7  78.0 - 100.0 fl Final  . MCHC 10/31/2018 33.6  30.0 - 36.0 g/dL Final  . RDW 10/31/2018 14.3  11.5 - 15.5 % Final    Allergies as of 11/05/2018   No Known Allergies     Medication List       Accurate as of November 05, 2018 10:33 AM. Always use your most recent med list.        chlorthalidone 25 MG tablet Commonly known as:  HYGROTON Take 25 mg by mouth daily.   FREESTYLE LIBRE 14 DAY SENSOR Misc 1 Units by Does not apply route every 14 (fourteen) days.   glucose blood test strip Use as instructed to test blood sugar 3 times daily E11.65   insulin aspart 100 UNIT/ML FlexPen Commonly known as:  NOVOLOG Inject 5 Units into the skin 3 (three) times daily with meals. INJECT 5 UNITS UNDER THE SKIN THREE TIMES DAILY WITH MEALS.   insulin glargine 100 unit/mL Sopn Commonly known as:  LANTUS Inject 0.2 mLs (20 Units total) into the skin daily. INJECT 20 UNITS UNDER THE SKIN ONCE DAILY. DX:E11.65   lisinopril 40 MG tablet Commonly known as:  PRINIVIL,ZESTRIL Take 40 mg by mouth daily.   metFORMIN 500 MG tablet Commonly known as:  GLUCOPHAGE Take 1,000 mg by mouth 2 (two) times daily with a meal.   pioglitazone 15 MG tablet Commonly known as:  ACTOS Take 1 tablet (15 mg total) by mouth daily.   rosuvastatin 40 MG tablet Commonly known as:  CRESTOR Take 1 tablet (40 mg total) by mouth daily.   Travoprost (BAK Free) 0.004 % Soln ophthalmic  solution Commonly known as:  TRAVATAN Place 1 drop into both eyes at bedtime.       Allergies: No Known Allergies  Past Medical History:  Diagnosis Date  . Diabetes mellitus without complication ([place])   . Glaucoma   . History of chicken pox   . Hypertension     Past Surgical History:  Procedure Laterality Date  . CESAREAN SECTION    . EYE SURGERY      Family History  Problem Relation Age of Onset  . Hypertension Mother   .  Diabetes Brother   . CAD Brother   . Hypertension Brother   . Diabetes Brother   . Hypertension Brother     Social History:  reports that she has never smoked. She has never used smokeless tobacco. She reports that she does not drink alcohol or use drugs.   Review of Systems   Lipid history: Currently on Crestor 40 mg but her lipids are much higher and likely she is not taking it recently   Lab Results  Component Value Date   CHOL 262 (H) 11/03/2018   HDL 60.10 11/03/2018   LDLCALC 183 (H) 11/03/2018   TRIG 94.0 11/03/2018   CHOLHDL 4 11/03/2018           Hypertension: Has been present for several years on maximum dose chlorthalidone and lisinopril Has recently seen PCP for initial visit  BP Readings from Last 3 Encounters:  11/05/18 (!) 160/70  10/31/18 (!) 160/80  09/23/18 140/72   Renal function stable recently  Lab Results  Component Value Date   CREATININE 1.20 11/03/2018   CREATININE 1.13 10/31/2018   CREATININE 1.26 (H) 09/17/2018    Most recent eye exam was in 5/19  Most recent foot exam: 8/19  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 Ferritin 11/04/2018 91.0  10.0 - 291.0 ng/mL Final  . Iron 11/04/2018 79  42 - 145 ug/dL Final  . Transferrin 11/04/2018 230.0  212.0 - 360.0 mg/dL Final  . Saturation Ratios 11/04/2018 24.5  20.0 - 50.0 % Final  Lab on 11/03/2018  Component Date Value Ref Range Status  . Cholesterol  11/03/2018 262* 0 - 200 mg/dL Final   ATP III Classification       Desirable:  < 200 mg/dL               Borderline High:  200 - 239 mg/dL          High:  > = 240 mg/dL  . Triglycerides 11/03/2018 94.0  0.0 - 149.0 mg/dL Final   Normal:  <150 mg/dLBorderline High:  150 - 199 mg/dL  . HDL 11/03/2018 60.10  >39.00 mg/dL Final  . VLDL 11/03/2018 18.8  0.0 - 40.0 mg/dL Final  . LDL Cholesterol 11/03/2018 183* 0 - 99 mg/dL Final  . Total CHOL/HDL Ratio 11/03/2018 4   Final                  Men          Women1/2 Average Risk     3.4          3.3Average Risk          5.0          4.42X Average Risk          9.6          7.13X Average Risk          15.0          11.0                      . NonHDL 11/03/2018 201.59   Final   NOTE:  Non-HDL goal should be 30 mg/dL higher than patient's LDL goal (i.e. LDL goal of < 70 mg/dL, would have non-HDL goal of < 100 mg/dL)  . Fructosamine 11/03/2018 369* 0 - 285 umol/L Final   Comment: Published reference interval for apparently healthy subjects  between age 27 and 32 is 57 - 28 umol/L and in a poorly controlled diabetic population is 228 - 563 umol/L with a mean of 396 umol/L.   Marland Kitchen Sodium 11/03/2018 138  135 - 145 mEq/L Final  . Potassium 11/03/2018 4.8  3.5 - 5.1 mEq/L Final  . Chloride 11/03/2018 106  96 - 112 mEq/L Final  . CO2 11/03/2018 27  19 - 32 mEq/L Final  . Glucose, Bld 11/03/2018 143* 70 - 99 mg/dL Final  . BUN 11/03/2018 24* 6 - 23 mg/dL Final  . Creatinine, Ser 11/03/2018 1.20  0.40 - 1.20 mg/dL Final  . Calcium 11/03/2018 9.9  8.4 - 10.5 mg/dL Final  . GFR 11/03/2018 56.85* >60.00 mL/min Final  Abstract on 11/03/2018  Component Date Value Ref Range Status  . HM Diabetic Eye Exam 09/29/2018 No Retinopathy  No Retinopathy Final  Appointment on 10/31/2018  Component Date Value Ref Range Status  . Sodium 10/31/2018 139  135 - 145 mEq/L Final  . Potassium 10/31/2018 4.3  3.5 - 5.1 mEq/L Final  . Chloride 10/31/2018 106  96 - 112 mEq/L Final   . CO2 10/31/2018 24  19 - 32 mEq/L Final  . Glucose, Bld 10/31/2018 106* 70 - 99 mg/dL Final  . BUN 10/31/2018 24* 6 - 23 mg/dL Final  . Creatinine, Ser 10/31/2018 1.13  0.40 - 1.20 mg/dL Final  . Total Bilirubin 10/31/2018 0.4  0.2 - 1.2 mg/dL Final  . Alkaline Phosphatase 10/31/2018 43  39 - 117 U/L Final  . AST 10/31/2018 16  0 - 37 U/L Final  . ALT 10/31/2018 9  0 - 35 U/L Final  . Total Protein 10/31/2018 7.0  6.0 - 8.3 g/dL Final  . Albumin 10/31/2018 4.1  3.5 - 5.2 g/dL Final  . Calcium 10/31/2018 10.1  8.4 - 10.5 mg/dL Final  . GFR 10/31/2018 60.94  >60.00 mL/min Final  . WBC 10/31/2018 4.7  4.0 - 10.5 K/uL Final  . RBC 10/31/2018 3.50* 3.87 - 5.11 Mil/uL Final  . Platelets 10/31/2018 225.0  150.0 - 400.0 K/uL Final  . Hemoglobin 10/31/2018 10.8* 12.0 - 15.0 g/dL Final  . HCT 10/31/2018 32.1* 36.0 - 46.0 % Final  . MCV 10/31/2018 91.7  78.0 - 100.0 fl Final  . MCHC 10/31/2018 33.6  30.0 - 36.0 g/dL Final  . RDW 10/31/2018 14.3  11.5 - 15.5 % Final    Physical Examination:  BP (!) 160/70 (BP Location: Left Arm, Patient Position: Sitting, Cuff Size: Normal)   Pulse 83   Ht 4\' 11"  (1.499 m)   Wt 96 lb 3.2 oz (43.6 kg)   SpO2 97%   BMI 19.43 kg/m         ASSESSMENT:  Diabetes type 2, insulin-dependent    A1c is last 10.5, fructosamine 369  See history of present illness for detailed discussion of current diabetes management, blood sugar patterns and problems identified  With more consistent monitoring of her blood sugar she appears to be having better control However fructosamine still indicates overall high readings despite her CGM average being 127 with only  She has variable but mostly high readings after lunch and dinner and relatively flat readings after breakfast However overnight readings appear to be low normal or low with 8% of readings below 70 although not clear if her freestyle Elenor Legato is reading falsely low   HYPERTENSION: Her blood pressure is  relatively high and she will be following up with PCP next month May benefit  from checking blood pressure at home also  Renal dysfunction: Her creatinine is recently stable  PLAN:    She will need to check her sugars at least 3 times a day at different times  Increase NovoLog at least 1 unit at lunch and dinner  Reduce Lantus to 18 units to avoid overnight hypoglycemia  Change Lantus to the morning instead of evening as it may control daytime blood sugars better without blood sugars dipping low during the night  She will need to make sure she takes her Crestor regularly and prescription was sent to her mail order supply company    Patient Instructions  LANTUS 18 UNITS DAILY AND CHANGE TO AM WITH NOVOLOG starting on 1/16  NOVOLOG 4 IN AM AND 6 AT LUNCH AND DINNER  Refill [email or web address]      Elayne Snare 11/05/2018, 10:33 AM   Note: This office note was prepared with Dragon voice recognition system technology. Any transcriptional errors that result from this process are unintentional.

## 2018-11-07 ENCOUNTER — Other Ambulatory Visit: Payer: Self-pay | Admitting: Nurse Practitioner

## 2018-11-07 ENCOUNTER — Telehealth: Payer: Self-pay | Admitting: Nurse Practitioner

## 2018-11-07 DIAGNOSIS — D649 Anemia, unspecified: Secondary | ICD-10-CM

## 2018-11-07 NOTE — Telephone Encounter (Signed)
Pt calling back for labs. Please call back.

## 2018-11-07 NOTE — Telephone Encounter (Signed)
Copied from [place]. Topic: Quick Communication - Lab Results (Clinic Use ONLY) >> Nov 07, 2018  1:01 PM Blondell Reveal, [place] wrote: Called patient to inform them of recent lab results. When patient returns call, triage nurse may disclose results.

## 2018-11-21 ENCOUNTER — Telehealth: Payer: Self-pay | Admitting: Endocrinology

## 2018-11-21 NOTE — Telephone Encounter (Signed)
Per patient "states all of her medications need to be sent to [facility]"  Was supposed to be done at her last appointment but has not been

## 2018-11-21 NOTE — Telephone Encounter (Signed)
Per patient "states all of her medications need to be sent to [facility]

## 2018-11-24 ENCOUNTER — Other Ambulatory Visit: Payer: Self-pay

## 2018-11-24 MED ORDER — INSULIN GLARGINE 100 UNITS/ML SOLOSTAR PEN: 20.0000 [IU] | PEN_INJECTOR | Freq: Every day | SUBCUTANEOUS | 1 refills | Status: DC

## 2018-11-24 MED ORDER — FREESTYLE LIBRE 14 DAY SENSOR MISC: 1.0000 [IU] | 1 refills | Status: DC

## 2018-11-24 MED ORDER — GLUCOSE BLOOD VI STRP: ORAL_STRIP | 1 refills | Status: AC

## 2018-11-24 MED ORDER — LISINOPRIL 40 MG PO TABS: 40.0000 mg | ORAL_TABLET | Freq: Every day | ORAL | 1 refills | Status: DC

## 2018-11-24 MED ORDER — ROSUVASTATIN CALCIUM 40 MG PO TABS: 40.0000 mg | ORAL_TABLET | Freq: Every day | ORAL | 1 refills | Status: DC

## 2018-11-24 MED ORDER — PIOGLITAZONE HCL 15 MG PO TABS: 15.0000 mg | ORAL_TABLET | Freq: Every day | ORAL | 1 refills | Status: DC

## 2018-11-24 MED ORDER — TRAVOPROST (BAK FREE) 0.004 % OP SOLN: 1.0000 [drp] | Freq: Every day | OPHTHALMIC | 1 refills | Status: DC

## 2018-11-24 MED ORDER — METFORMIN HCL 500 MG PO TABS: 1000.0000 mg | ORAL_TABLET | Freq: Two times a day (BID) | ORAL | 1 refills | Status: DC

## 2018-11-24 MED ORDER — INSULIN ASPART 100 UNIT/ML FLEXPEN: 5.0000 [IU] | PEN_INJECTOR | Freq: Three times a day (TID) | SUBCUTANEOUS | 1 refills | Status: DC

## 2018-11-24 NOTE — Telephone Encounter (Signed)
Called pt and she stated that she needed all prescriptions that Dr. Dwyane Dee prescribes sent to [facility]. This was done.

## 2018-12-03 ENCOUNTER — Encounter: Payer: Self-pay | Admitting: Nurse Practitioner

## 2018-12-03 ENCOUNTER — Ambulatory Visit (INDEPENDENT_AMBULATORY_CARE_PROVIDER_SITE_OTHER): Payer: Medicare Other | Admitting: Nurse Practitioner

## 2018-12-03 ENCOUNTER — Encounter: Payer: Self-pay | Admitting: Gastroenterology

## 2018-12-03 VITALS — BP 160/80 | HR 78 | Ht 59.0 in | Wt 93.0 lb

## 2018-12-03 DIAGNOSIS — Z1231 Encounter for screening mammogram for malignant neoplasm of breast: Secondary | ICD-10-CM

## 2018-12-03 DIAGNOSIS — D649 Anemia, unspecified: Secondary | ICD-10-CM | POA: Diagnosis not present

## 2018-12-03 DIAGNOSIS — Z0000 Encounter for general adult medical examination without abnormal findings: Secondary | ICD-10-CM

## 2018-12-03 DIAGNOSIS — I10 Essential (primary) hypertension: Secondary | ICD-10-CM | POA: Diagnosis not present

## 2018-12-03 DIAGNOSIS — Z23 Encounter for immunization: Secondary | ICD-10-CM

## 2018-12-03 NOTE — Assessment & Plan Note (Signed)
No red flags on history, PE today She was given phone number to call GI for appointment Red flags and return precautions including when to seek immediate care discussed and printed on AVS

## 2018-12-03 NOTE — Progress Notes (Signed)
Mariah Peterson is a 73 y.o. female with the following history as recorded in EpicCare:  Patient Active Problem List   Diagnosis Date Noted  . Hypertension 10/31/2018  . Hyperlipidemia 08/04/2013  . Diabetes mellitus ([place]) 08/04/2013    Current Outpatient Medications  Medication Sig Dispense Refill  . chlorthalidone (HYGROTON) 25 MG tablet Take 25 mg by mouth daily.    . Continuous Blood Gluc Sensor (FREESTYLE LIBRE 14 DAY SENSOR) MISC 1 Units by Does not apply route every 14 (fourteen) days. 7 each 1  . glucose blood test strip Use as instructed to test blood sugar 3 times daily E11.65 270 each 1  . insulin aspart (NOVOLOG) 100 UNIT/ML FlexPen Inject 5 Units into the skin 3 (three) times daily with meals. INJECT 5 UNITS UNDER THE SKIN THREE TIMES DAILY WITH MEALS. 5 pen 1  . insulin glargine (LANTUS) 100 unit/mL SOPN Inject 0.2 mLs (20 Units total) into the skin daily. INJECT 20 UNITS UNDER THE SKIN ONCE DAILY. DX:E11.65 6 pen 1  . lisinopril (PRINIVIL,ZESTRIL) 40 MG tablet Take 1 tablet (40 mg total) by mouth daily. 90 tablet 1  . metFORMIN (GLUCOPHAGE) 500 MG tablet Take 2 tablets (1,000 mg total) by mouth 2 (two) times daily with a meal. 360 tablet 1  . pioglitazone (ACTOS) 15 MG tablet Take 1 tablet (15 mg total) by mouth daily. 90 tablet 1  . rosuvastatin (CRESTOR) 40 MG tablet Take 1 tablet (40 mg total) by mouth daily. 90 tablet 1  . Travoprost, BAK Free, (TRAVATAN) 0.004 % SOLN ophthalmic solution Place 1 drop into both eyes at bedtime. 2.5 mL 1   No current facility-administered medications for this visit.     Allergies: Patient has no known allergies.  Past Medical History:  Diagnosis Date  . Diabetes mellitus without complication ([place])   . Glaucoma   . History of chicken pox   . Hypertension     Past Surgical History:  Procedure Laterality Date  . CESAREAN SECTION    . EYE SURGERY      Family History  Problem Relation Age of Onset  . Hypertension Mother   . Diabetes  Brother   . CAD Brother   . Hypertension Brother   . Diabetes Brother   . Hypertension Brother     Social History   Tobacco Use  . Smoking status: Never Smoker  . Smokeless tobacco: Never Used  Substance Use Topics  . Alcohol use: No     Subjective:  Ms Morning is here today for follow up of HTN, annual health maintenance. At her last OV on 10/31/2018, she was also noted to be anemic on labs, a referral was placed to GI for further evaluation and to discuss colonoscopy which she is overdue for, she has not heard about the referral yet. For her blood pressure, she is maintained on chlorthalidone 25, lisinopril 40 daily, at her last OV on 10/31/18, she did report not taking her medications that day, we discussed medication compliance and she was instructed to return in 1 month for follow up, to recheck BP. She is back today for this follow up, again today tells me she has  not taken her medications yet today, has not eaten breakfast yet and always waits to take medications after she eats breakfast, does always take meds daily without noted adverse medication effects. She does not regularly check BP readings at home, but does have a blood pressure monitor. She feels well today, without complaints. Denies weakness,  syncope, headaches, vision changes, chest pain, shortness of breath, abdominal pain, n/v/d, edema, abnormal bruising or bleeding, rectal bleeding  BP Readings from Last 3 Encounters:  12/03/18 (!) 160/80  11/05/18 (!) 160/70  10/31/18 (!) 160/80    ROS- See HPI  Objective:  Vitals:   12/03/18 0912 12/03/18 1004  BP: (!) 170/78 (!) 160/80  Pulse: 78   SpO2: 98%   Weight: 93 lb (42.2 kg)   Height: 4\' 11"  (1.499 m)     General: Well developed, well nourished, in no acute distress  Skin : Warm and dry.  Head: Normocephalic and atraumatic  Eyes: Sclera and conjunctiva clear; pupils round and reactive to light; extraocular movements intact  Oropharynx: Pink, supple. No  suspicious lesions  Neck: Supple without thyromegaly Lungs: Respirations unlabored; clear to auscultation bilaterally without wheeze, rales, rhonchi  CVS exam: normal rate and regular rhythm, S1 and S2 normal.  Abdomen: Soft; nontender; nondistended; no masses or hepatosplenomegaly  Extremities: No edema, cyanosis, clubbing  Vessels: Symmetric bilaterally  Neurologic: Alert and oriented; speech intact; face symmetrical; moves all extremities well; CNII-XII intact without focal deficit  Psychiatric: Normal mood and affect.  Assessment:  1. Hypertension, unspecified type   2. Anemia, unspecified type   3. Healthcare maintenance   4. Encounter for screening mammogram for breast cancer   5. Need for Tdap vaccination     Plan:   Return in about 6 months (around 06/03/2019) for routine follow up.  Orders Placed This Encounter  Procedures  . MM DIGITAL SCREENING BILATERAL    Standing Status:   Future    Standing Expiration Date:   02/01/2020    Order Specific Question:   Reason for Exam (SYMPTOM  OR DIAGNOSIS REQUIRED)    Answer:   screening for breast cancer    Order Specific Question:   Preferred imaging location?    Answer:   [facility]  . Tdap vaccine greater than or equal to 7yo IM    Requested Prescriptions    No prescriptions requested or ordered in this encounter

## 2018-12-03 NOTE — Assessment & Plan Note (Signed)
Healthcare maintenance - MM DIGITAL SCREENING BILATERAL; Future  Encounter for screening mammogram for breast cancer - MM DIGITAL SCREENING BILATERAL; Future  Need for Tdap vaccination - Tdap vaccine greater than or equal to 72yo IM

## 2018-12-03 NOTE — Patient Instructions (Addendum)
Please try to check your blood pressure once daily or at least a few times a week, at the same time each day, and keep a log. Please let me know if you are getting readings over 150/90 at home.  Please call GI to schedule an appointment: [facility]  Located in: [facility]  Address: [address], [place], [place] [address]  Phone: [phone]  I will plan to see you in 6 months, sooner if needed!   Anemia  Anemia is a condition in which you do not have enough red blood cells or hemoglobin. Hemoglobin is a substance in red blood cells that carries oxygen. When you do not have enough red blood cells or hemoglobin (are anemic), your body cannot get enough oxygen and your organs may not work properly. As a result, you may feel very tired or have other problems. What are the causes? Common causes of anemia include:  Excessive bleeding. Anemia can be caused by excessive bleeding inside or outside the body, including bleeding from the intestine or from periods in women.  Poor nutrition.  Long-lasting (chronic) kidney, thyroid, and liver disease.  Bone marrow disorders.  Cancer and treatments for cancer.  HIV (human immunodeficiency virus) and AIDS (acquired immunodeficiency syndrome).  Treatments for HIV and AIDS.  Spleen problems.  Blood disorders.  Infections, medicines, and autoimmune disorders that destroy red blood cells. What are the signs or symptoms? Symptoms of this condition include:  Minor weakness.  Dizziness.  Headache.  Feeling heartbeats that are irregular or faster than normal (palpitations).  Shortness of breath, especially with exercise.  Paleness.  Cold sensitivity.  Indigestion.  Nausea.  Difficulty sleeping.  Difficulty concentrating. Symptoms may occur suddenly or develop slowly. If your anemia is mild, you may not have symptoms. How is this diagnosed? This condition is diagnosed  based on:  Blood tests.  Your medical history.  A physical exam.  Bone marrow biopsy. Your health care provider may also check your stool (feces) for blood and may do additional testing to look for the cause of your bleeding. You may also have other tests, including:  Imaging tests, such as a CT scan or MRI.  Endoscopy.  Colonoscopy. How is this treated? Treatment for this condition depends on the cause. If you continue to lose a lot of blood, you may need to be treated at a hospital. Treatment may include:  Taking supplements of iron, vitamin H63, or folic acid.  Taking a hormone medicine (erythropoietin) that can help to stimulate red blood cell growth.  Having a blood transfusion. This may be needed if you lose a lot of blood.  Making changes to your diet.  Having surgery to remove your spleen. Follow these instructions at home:  Take over-the-counter and prescription medicines only as told by your health care provider.  Take supplements only as told by your health care provider.  Follow any diet instructions that you were given.  Keep all follow-up visits as told by your health care provider. This is important. Contact a health care provider if:  You develop new bleeding anywhere in the body. Get help right away if:  You are very weak.  You are short of breath.  You have pain in your abdomen or chest.  You are dizzy or feel faint.  You have trouble concentrating.  You have bloody or black, tarry stools.  You vomit repeatedly or you vomit up blood. Summary  Anemia is a condition  in which you do not have enough red blood cells or enough of a substance in your red blood cells that carries oxygen (hemoglobin).  Symptoms may occur suddenly or develop slowly.  If your anemia is mild, you may not have symptoms.  This condition is diagnosed with blood tests as well as a medical history and physical exam. Other tests may be needed.  Treatment for this  condition depends on the cause of the anemia. This information is not intended to replace advice given to you by your health care provider. Make sure you discuss any questions you have with your health care provider. Document Released: 11/15/2004 Document Revised: 11/09/2016 Document Reviewed: 11/09/2016 Elsevier Interactive Patient Education  2019 [employer].

## 2018-12-03 NOTE — Assessment & Plan Note (Signed)
Again today BP slightly elevated off medications We discussed home log, she will continue current medications and begin to monitor BP readings at home and f/u for readings >150/90 Return in 6 months for f/u or sooner for BP readings >150/90

## 2018-12-25 ENCOUNTER — Other Ambulatory Visit (INDEPENDENT_AMBULATORY_CARE_PROVIDER_SITE_OTHER): Payer: Medicare Other

## 2018-12-25 ENCOUNTER — Other Ambulatory Visit: Payer: Medicare Other

## 2018-12-25 ENCOUNTER — Ambulatory Visit (INDEPENDENT_AMBULATORY_CARE_PROVIDER_SITE_OTHER): Payer: Medicare Other | Admitting: Gastroenterology

## 2018-12-25 ENCOUNTER — Encounter: Payer: Self-pay | Admitting: Gastroenterology

## 2018-12-25 ENCOUNTER — Telehealth: Payer: Self-pay

## 2018-12-25 VITALS — BP 150/60 | HR 76 | Ht 59.0 in | Wt 92.2 lb

## 2018-12-25 DIAGNOSIS — E119 Type 2 diabetes mellitus without complications: Secondary | ICD-10-CM

## 2018-12-25 DIAGNOSIS — Z794 Long term (current) use of insulin: Secondary | ICD-10-CM | POA: Diagnosis not present

## 2018-12-25 DIAGNOSIS — Z1211 Encounter for screening for malignant neoplasm of colon: Secondary | ICD-10-CM

## 2018-12-25 DIAGNOSIS — D649 Anemia, unspecified: Secondary | ICD-10-CM | POA: Diagnosis not present

## 2018-12-25 LAB — RETICULOCYTES
ABS Retic: 27760 cells/uL (ref 20000–8000)
Retic Ct Pct: 0.8 %

## 2018-12-25 LAB — FOLATE: FOLATE: 21.9 ng/mL (ref >5.9)

## 2018-12-25 LAB — VITAMIN B12: Vitamin B-12: 311 pg/mL (ref 211–911)

## 2018-12-25 MED ORDER — PEG-KCL-NACL-NASULF-NA ASC-C 140 G PO SOLR: 140.0000 g | ORAL | 0 refills | Status: DC

## 2018-12-25 NOTE — Telephone Encounter (Signed)
She is supposed to be on 18 units of Lantus daily. The day before and the day after the colonoscopy she will take only 10 units of Lantus and no NovoLog unless she is eating a meal

## 2018-12-25 NOTE — Telephone Encounter (Signed)
Ok, so the day before only take 10 units of lantus and no novolog while on the clear liquid diet.  Take the actos and metformin the day before but hold the morning of the colonoscopy. So would she hold the insulin the morning of the procedure?

## 2018-12-25 NOTE — Telephone Encounter (Signed)
She will take 10 units of Lantus the day before and on the day of colonoscopy as on my note

## 2018-12-25 NOTE — Telephone Encounter (Signed)
She will continue to take the metformin the day before but not on the day of the colonoscopy

## 2018-12-25 NOTE — Telephone Encounter (Signed)
Mariah Peterson is scheduled for a colonoscopy on 02-04-2019 @ 9am  Please advise on how to manage her diabetic medication regimen to accommodate for a clear liquid diet. The day before, and the day off is clear liquids until 3 hours prior. She does have to  fast 3 hours prior to her colonoscopy. Can resume regular diet after procedure.   Novolog 6 units 3 times a day Lantus 20 units once a day Metformin 1000mg  twice a day Actos 15 mg once a day  Thank you in advance  [place], CMA

## 2018-12-25 NOTE — Telephone Encounter (Signed)
Thank you sir. What about the oral medications? Take as directed or hold?

## 2018-12-25 NOTE — Patient Instructions (Addendum)
If you are age 72 or older, your body mass index should be between 23-30. Your Body mass index is 18.62 kg/m. If this is out of the aforementioned range listed, please consider follow up with your Primary Care Provider.  If you are age 54 or younger, your body mass index should be between 19-25. Your Body mass index is 18.62 kg/m. If this is out of the aformentioned range listed, please consider follow up with your Primary Care Provider.   You have been scheduled for a colonoscopy. Please follow written instructions given to you at your visit today.  Please pick up your prep supplies at the pharmacy within the next 1-3 days. If you use inhalers (even only as needed), please bring them with you on the day of your procedure. Your physician has requested that you go to www.startemmi.com and enter the access code given to you at your visit today. This web site gives a general overview about your procedure. However, you should still follow specific instructions given to you by our office regarding your preparation for the procedure.  Your provider has requested that you go to the basement level for lab work before leaving today. Press "B" on the elevator. The lab is located at the first door on the left as you exit the elevator.  We will contact you about your insulin instructions after talking to Dr Dwyane Dee  It was a pleasure to see you today!  Dr. Loletha Carrow

## 2018-12-25 NOTE — Progress Notes (Signed)
[facility] Consult Note:  History: Mariah Peterson 12/25/2018  Referring physician: Lance Sell, NP  Reason for consult/chief complaint: Anemia (Per Brien Few, NP; patient indicates she has had anemia before many years ago; Patient denies any gi symptoms, specifically no change in bowels or dark black stools; no abdominal pain or gerd)   Subjective  HPI:  This is a very pleasant 73 year old woman referred by primary care for anemia. Holy denies any digestive symptoms such as dysphagia, nausea, vomiting, constipation, diarrhea or rectal bleeding.  She may have lost some weight in the last couple years, but is uncertain much that might be.  She recalls a colonoscopy at another practice in town perhaps 15 years ago and no colorectal cancer screening since then.  Denies a family history of colorectal cancer.  He seems to recall having been told she was anemic many years ago but cannot recall any work-up.  ROS:  Review of Systems  Constitutional: Negative for appetite change and unexpected weight change.  HENT: Negative for mouth sores and voice change.   Eyes: Negative for pain and redness.  Respiratory: Negative for cough and shortness of breath.   Cardiovascular: Negative for chest pain and palpitations.  Genitourinary: Negative for dysuria and hematuria.  Musculoskeletal: Negative for arthralgias and myalgias.  Skin: Negative for pallor and rash.  Neurological: Negative for weakness and headaches.  Hematological: Negative for adenopathy.     Past Medical History: Past Medical History:  Diagnosis Date  . Diabetes mellitus without complication ([place])   . Glaucoma   . History of chicken pox   . Hypertension      Past Surgical History: Past Surgical History:  Procedure Laterality Date  . CATARACT EXTRACTION, BILATERAL    . CESAREAN SECTION    . TUBAL LIGATION       Family History: Family History  Problem Relation Age of Onset  .  Hypertension Mother   . Diabetes Brother   . CAD Brother   . Hypertension Brother   . Diabetes Brother   . Hypertension Brother   . Colon cancer Neg Hx   . Esophageal cancer Neg Hx   . Pancreatic cancer Neg Hx   . Liver disease Neg Hx   . Stomach cancer Neg Hx     Social History: Social History   Socioeconomic History  . Marital status: Married    Spouse name: Not on file  . Number of children: Not on file  . Years of education: Not on file  . Highest education level: Not on file  Occupational History  . Not on file  Social Needs  . Financial resource strain: Not on file  . Food insecurity:    Worry: Not on file    Inability: Not on file  . Transportation needs:    Medical: Not on file    Non-medical: Not on file  Tobacco Use  . Smoking status: Never Smoker  . Smokeless tobacco: Never Used  Substance and Sexual Activity  . Alcohol use: No  . Drug use: No  . Sexual activity: Yes  Lifestyle  . Physical activity:    Days per week: Not on file    Minutes per session: Not on file  . Stress: Not on file  Relationships  . Social connections:    Talks on phone: Not on file    Gets together: Not on file    Attends religious service: Not on file    Active member of club or  organization: Not on file    Attends meetings of clubs or organizations: Not on file    Relationship status: Not on file  Other Topics Concern  . Not on file  Social History Narrative  . Not on file    Allergies: No Known Allergies  Outpatient Meds: Current Outpatient Medications  Medication Sig Dispense Refill  . Continuous Blood Gluc Sensor (FREESTYLE LIBRE 14 DAY SENSOR) MISC 1 Units by Does not apply route every 14 (fourteen) days. 7 each 1  . glucose blood test strip Use as instructed to test blood sugar 3 times daily E11.65 270 each 1  . insulin aspart (NOVOLOG) 100 UNIT/ML FlexPen Inject 5 Units into the skin 3 (three) times daily with meals. INJECT 5 UNITS UNDER THE SKIN THREE TIMES  DAILY WITH MEALS. (Patient taking differently: Inject 6 Units into the skin 3 (three) times daily with meals. INJECT 5 UNITS UNDER THE SKIN THREE TIMES DAILY WITH MEALS. ) 5 pen 1  . insulin glargine (LANTUS) 100 unit/mL SOPN Inject 0.2 mLs (20 Units total) into the skin daily. INJECT 20 UNITS UNDER THE SKIN ONCE DAILY. DX:E11.65 6 pen 1  . lisinopril (PRINIVIL,ZESTRIL) 40 MG tablet Take 1 tablet (40 mg total) by mouth daily. 90 tablet 1  . metFORMIN (GLUCOPHAGE) 500 MG tablet Take 2 tablets (1,000 mg total) by mouth 2 (two) times daily with a meal. 360 tablet 1  . pioglitazone (ACTOS) 15 MG tablet Take 1 tablet (15 mg total) by mouth daily. 90 tablet 1  . rosuvastatin (CRESTOR) 40 MG tablet Take 1 tablet (40 mg total) by mouth daily. 90 tablet 1  . Travoprost, BAK Free, (TRAVATAN) 0.004 % SOLN ophthalmic solution Place 1 drop into both eyes at bedtime. 2.5 mL 1  . PEG-KCl-NaCl-NaSulf-Na Asc-C (PLENVU) 140 g SOLR Take 140 g by mouth as directed. 1 each 0   No current facility-administered medications for this visit.       ___________________________________________________________________ Objective   Exam:  BP (!) 150/60   Pulse 76   Ht 4\' 11"  (1.499 m)   Wt 92 lb 3.2 oz (41.8 kg)   BMI 18.62 kg/m  Petite, thin, no muscle wasting  General: Well-appearing, her husband Jeneen Rinks is with her  Eyes: sclera anicteric, no redness  ENT: oral mucosa moist without lesions, no cervical or supraclavicular lymphadenopathy  CV: RRR without murmur, S1/S2, no JVD, no peripheral edema  Resp: clear to auscultation bilaterally, normal RR and effort noted  GI: soft, no tenderness, with active bowel sounds. No guarding or palpable organomegaly noted.  Skin; warm and dry, no rash or jaundice noted  Neuro: awake, alert and oriented x 3. Normal gross motor function and fluent speech  Labs:  CBC Latest Ref Rng & Units 10/31/2018 06/26/2018 02/13/2014  WBC 4.0 - 10.5 K/uL 4.7 7.5 7.5  Hemoglobin 12.0 -  15.0 g/dL 10.8(L) 10.7(L) 13.7  Hematocrit 36.0 - 46.0 % 32.1(L) 34.8(L) 38.7  Platelets 150.0 - 400.0 K/uL 225.0 214 220  MCV 92, RDW normal   On 11/04/2018, iron 79, duration 25%, ferritin 91  CMP Latest Ref Rng & Units 11/03/2018 10/31/2018 09/17/2018  Glucose 70 - 99 mg/dL 143(H) 106(H) 144(H)  BUN 6 - 23 mg/dL 24(H) 24(H) 31(H)  Creatinine 0.40 - 1.20 mg/dL 1.20 1.13 1.26(H)  Sodium 135 - 145 mEq/L 138 139 138  Potassium 3.5 - 5.1 mEq/L 4.8 4.3 4.2  Chloride 96 - 112 mEq/L 106 106 105  CO2 19 - 32 mEq/L 27 24  26  Calcium 8.4 - 10.5 mg/dL 9.9 10.1 9.7  Total Protein 6.0 - 8.3 g/dL - 7.0 7.0  Total Bilirubin 0.2 - 1.2 mg/dL - 0.4 0.5  Alkaline Phos 39 - 117 U/L - 43 40  AST 0 - 37 U/L - 16 13  ALT 0 - 35 U/L - 9 8   Last hemoglobin A1c 10.5  No reticulocyte count, vitamin B12 or folate level on file  Assessment: Encounter Diagnoses  Name Primary?  . Normocytic anemia Yes  . Special screening for malignant neoplasms, colon   . Type 2 diabetes mellitus without complication, with long-term current use of insulin (HCC)     Normocytic anemia with no localizing GI symptoms, it does not appear to be related to GI blood loss.  She was sent to lab for reticulocyte count, P03 and folic acid, and will be referred back to primary care for further work-up and consideration of hematology evaluation as needed.  She is in need of colorectal cancer screening.  Both Cologuard and colonoscopy were reviewed in detail, and she has elected to have a colonoscopy.  Risks and benefits were reviewed.  The benefits and risks of the planned procedure were described in detail with the patient or (when appropriate) their health care proxy.  Risks were outlined as including, but not limited to, bleeding, infection, perforation, adverse medication reaction leading to cardiac or pulmonary decompensation.  The limitation of incomplete mucosal visualization was also discussed.  No guarantees or warranties were  given.  We will also coordinate with her endocrinologist some recommendations on managing her diabetic regimen the day before and day of procedure since she will be on a clear liquid diet the day prior and n.p.o. after midnight day of procedure.   Thank you for the courtesy of this consult.  Please call me with any questions or concerns.  Nelida Meuse III  CC: Referring provider noted above

## 2018-12-29 NOTE — Telephone Encounter (Signed)
Mariah Peterson,    Thanks for checking with me.  I see the confusion with probable typo ("day after" vs "day of" in Dr. Ronnie Derby first reply note)  Day before colonoscopy: Metformin and actos per usual Lantus insulin 10 units in AM No novolog insulin  Day of colonoscopy:  Lantus 10 units in AM   Then I will give her instructions for remainder of meds after the procedure.  - HD

## 2018-12-30 NOTE — Telephone Encounter (Signed)
Pt letter with instructions mailed to her home address on file.

## 2019-01-09 ENCOUNTER — Telehealth: Payer: Self-pay | Admitting: Endocrinology

## 2019-01-09 NOTE — Telephone Encounter (Signed)
Mariah Peterson with [employer] ph# [phone] called re: status of RX request for a glucose blood meter. Please call the above ph# to advise.

## 2019-01-12 ENCOUNTER — Ambulatory Visit: Payer: Medicare Other

## 2019-01-12 NOTE — Telephone Encounter (Signed)
Attempted to call the number listed below and it states that "the person at this extension, and lists the phone number, is not available. Please hang up and try again."

## 2019-01-20 ENCOUNTER — Telehealth: Payer: Self-pay | Admitting: Endocrinology

## 2019-01-20 NOTE — Telephone Encounter (Signed)
[facility] has called in regards to a fax. Will be refaxing today to correct fax number.

## 2019-02-02 ENCOUNTER — Other Ambulatory Visit: Payer: Medicare Other

## 2019-02-04 ENCOUNTER — Encounter: Payer: Medicare Other | Admitting: Gastroenterology

## 2019-02-04 ENCOUNTER — Ambulatory Visit: Payer: Medicare Other | Admitting: Endocrinology

## 2019-02-11 ENCOUNTER — Other Ambulatory Visit (INDEPENDENT_AMBULATORY_CARE_PROVIDER_SITE_OTHER): Payer: Medicare Other

## 2019-02-11 ENCOUNTER — Other Ambulatory Visit: Payer: Self-pay

## 2019-02-11 DIAGNOSIS — E785 Hyperlipidemia, unspecified: Secondary | ICD-10-CM | POA: Diagnosis not present

## 2019-02-11 DIAGNOSIS — E1165 Type 2 diabetes mellitus with hyperglycemia: Secondary | ICD-10-CM | POA: Diagnosis not present

## 2019-02-11 DIAGNOSIS — Z794 Long term (current) use of insulin: Secondary | ICD-10-CM | POA: Diagnosis not present

## 2019-02-11 LAB — COMPREHENSIVE METABOLIC PANEL
ALT: 11 U/L (ref 0–35)
AST: 19 U/L (ref 0–37)
Albumin: 4.1 g/dL (ref 3.5–5.2)
Alkaline Phosphatase: 36 U/L — ABNORMAL LOW (ref 39–117)
BUN: 25 mg/dL — ABNORMAL HIGH (ref 6–23)
CO2: 25 mEq/L (ref 19–32)
Calcium: 9.7 mg/dL (ref 8.4–10.5)
Chloride: 107 mEq/L (ref 96–112)
Creatinine, Ser: 1.25 mg/dL — ABNORMAL HIGH (ref 0.40–1.20)
GFR: 50.99 mL/min — ABNORMAL LOW (ref >60.00)
Glucose, Bld: 109 mg/dL — ABNORMAL HIGH (ref 70–99)
Potassium: 4.1 mEq/L (ref 3.5–5.1)
Sodium: 140 mEq/L (ref 135–145)
Total Bilirubin: 0.5 mg/dL (ref 0.2–1.2)
Total Protein: 6.8 g/dL (ref 6.0–8.3)

## 2019-02-11 LAB — LIPID PANEL
Cholesterol: 147 mg/dL (ref 0–200)
HDL: 68.5 mg/dL (ref >39.00)
LDL Cholesterol: 67 mg/dL (ref 0–99)
NonHDL: 78.73
Total CHOL/HDL Ratio: 2
Triglycerides: 61 mg/dL (ref 0.0–149.0)
VLDL: 12.2 mg/dL (ref 0.0–40.0)

## 2019-02-11 LAB — HEMOGLOBIN A1C: Hgb A1c MFr Bld: 9 % — ABNORMAL HIGH (ref 4.6–6.5)

## 2019-02-16 ENCOUNTER — Other Ambulatory Visit: Payer: Self-pay

## 2019-02-16 ENCOUNTER — Ambulatory Visit (INDEPENDENT_AMBULATORY_CARE_PROVIDER_SITE_OTHER): Payer: Medicare Other | Admitting: Endocrinology

## 2019-02-16 ENCOUNTER — Encounter: Payer: Self-pay | Admitting: Endocrinology

## 2019-02-16 VITALS — BP 160/70 | HR 86 | Ht 59.0 in | Wt 91.8 lb

## 2019-02-16 DIAGNOSIS — E7800 Pure hypercholesterolemia, unspecified: Secondary | ICD-10-CM | POA: Diagnosis not present

## 2019-02-16 DIAGNOSIS — I10 Essential (primary) hypertension: Secondary | ICD-10-CM

## 2019-02-16 DIAGNOSIS — E1165 Type 2 diabetes mellitus with hyperglycemia: Secondary | ICD-10-CM | POA: Diagnosis not present

## 2019-02-16 DIAGNOSIS — Z794 Long term (current) use of insulin: Secondary | ICD-10-CM

## 2019-02-16 NOTE — Progress Notes (Signed)
Patient ID: Mariah Peterson, female   DOB: 02-13-1946, 73 y.o.   MRN: [id number]          Reason for Appointment: Follow-up for Type 2 Diabetes  History of Present Illness:          Date of diagnosis of type 2 diabetes mellitus:?  1990       Background history:   She was started on insulin about 4 years ago and previously taking metformin and glipizide No detailed history is available and she does not think she has taken any other types of diabetes medications or injections  Recent history:   Her A1c is slightly better at 9% compared to 10.5  INSULIN regimen [id number] 20 units daily in am, Novolog 5 units bid      Non-insulin hypoglycemic drugs the patient is taking are: Metformin 500 mg twice daily, Actos 15 mg daily  Current management, blood sugar patterns and problems identified:  Her daughter has been helping her with her diabetes management but is again not available today in the office  She has been using the freestyle libre sensor but has done only available for 35% of the time because of lack of monitoring and mostly her checking has been done in the morning  Her Lantus was changed to the morning on her last visit she has much less tendency to low normal sugars overnight with this  No hypoglycemia documented or symptoms of low sugars present.  Only rarely early morning blood sugar may be low normal  However even though she has only minimal monitoring during the day her blood sugars are averaging nearly 200 after breakfast and over 200 after dinner although data is incomplete  She was told to take 6 units of NovoLog but is still taking 5 units  Currently only taking this twice a day because of eating only a snack at lunch and not a meal  This is despite her saying that she has relatively low appetite  She was seen by diabetes educator in 9/19       Side effects from medications have been: None  Compliance with the medical regimen: Fair  Typical meal intake:  Breakfast is cereal or oatmeal, otherwise eggs and toast.  Lunch may be rice and beans or a sandwich Fruit for snacks               Exercise:  walking with her grandson  Glucose monitoring:  done 1 times a day         Glucometer: Freestyle libre     Blood Glucose analysis by download of freestyle libre:  CGM use % of time  35  2-week average/SD  135  Time in range    82    %  % Time Above 180  14  % Time above 250 1  % Time Below 70 3     PRE-MEAL Fasting Lunch Dinner Bedtime Overall  Glucose range:       Averages:  115  186    135   POST-MEAL PC Breakfast PC Lunch PC Dinner  Glucose range:     Averages:  197   238    Dietician visit, most recent: None CDE consultation: 07/01/2018  Weight history:  Wt Readings from Last 3 Encounters:  02/16/19 91 lb 12.8 oz (41.6 kg)  12/25/18 92 lb 3.2 oz (41.8 kg)  12/03/18 93 lb (42.2 kg)    Glycemic control:   Lab Results  Component Value Date   HGBA1C  9.0 (H) 02/11/2019   HGBA1C 10.5 (H) 09/17/2018   HGBA1C 10.4 (A) 06/18/2018   Lab Results  Component Value Date   MICROALBUR 8.1 (H) 07/10/2018   LDLCALC 67 02/11/2019   CREATININE 1.25 (H) 02/11/2019   Lab Results  Component Value Date   MICRALBCREAT 11.4 07/10/2018    Lab Results  Component Value Date   FRUCTOSAMINE 369 (H) 11/03/2018   FRUCTOSAMINE 299 (H) 07/10/2018    Lab on 02/11/2019  Component Date Value Ref Range Status  . Cholesterol 02/11/2019 147  0 - 200 mg/dL Final   ATP III Classification       Desirable:  < 200 mg/dL               Borderline High:  200 - 239 mg/dL          High:  > = 240 mg/dL  . Triglycerides 02/11/2019 61.0  0.0 - 149.0 mg/dL Final   Normal:  <150 mg/dLBorderline High:  150 - 199 mg/dL  . HDL 02/11/2019 68.50  >39.00 mg/dL Final  . VLDL 02/11/2019 12.2  0.0 - 40.0 mg/dL Final  . LDL Cholesterol 02/11/2019 67  0 - 99 mg/dL Final  . Total CHOL/HDL Ratio 02/11/2019 2   Final                  Men          Women1/2 Average Risk      3.4          3.3Average Risk          5.0          4.42X Average Risk          9.6          7.13X Average Risk          15.0          11.0                      . NonHDL 02/11/2019 78.73   Final   NOTE:  Non-HDL goal should be 30 mg/dL higher than patient's LDL goal (i.e. LDL goal of < 70 mg/dL, would have non-HDL goal of < 100 mg/dL)  . Sodium 02/11/2019 140  135 - 145 mEq/L Final  . Potassium 02/11/2019 4.1  3.5 - 5.1 mEq/L Final  . Chloride 02/11/2019 107  96 - 112 mEq/L Final  . CO2 02/11/2019 25  19 - 32 mEq/L Final  . Glucose, Bld 02/11/2019 109* 70 - 99 mg/dL Final  . BUN 02/11/2019 25* 6 - 23 mg/dL Final  . Creatinine, Ser 02/11/2019 1.25* 0.40 - 1.20 mg/dL Final  . Total Bilirubin 02/11/2019 0.5  0.2 - 1.2 mg/dL Final  . Alkaline Phosphatase 02/11/2019 36* 39 - 117 U/L Final  . AST 02/11/2019 19  0 - 37 U/L Final  . ALT 02/11/2019 11  0 - 35 U/L Final  . Total Protein 02/11/2019 6.8  6.0 - 8.3 g/dL Final  . Albumin 02/11/2019 4.1  3.5 - 5.2 g/dL Final  . Calcium 02/11/2019 9.7  8.4 - 10.5 mg/dL Final  . GFR 02/11/2019 50.99* >60.00 mL/min Final  . Hgb A1c MFr Bld 02/11/2019 9.0* 4.6 - 6.5 % Final   Glycemic Control Guidelines for People with Diabetes:Non Diabetic:  <6%Goal of Therapy: <7%Additional Action Suggested:  >8%     Allergies as of 02/16/2019   No Known Allergies     Medication List  Accurate as of February 16, 2019  9:10 PM. Always use your most recent med list.        FreeStyle Libre 14 Day Sensor Misc 1 Units by Does not apply route every 14 (fourteen) days.   glucose blood test strip Use as instructed to test blood sugar 3 times daily E11.65   insulin aspart 100 UNIT/ML FlexPen Commonly known as:  NOVOLOG Inject 5 Units into the skin 3 (three) times daily with meals. INJECT 5 UNITS UNDER THE SKIN THREE TIMES DAILY WITH MEALS.   insulin glargine 100 unit/mL Sopn Commonly known as:  LANTUS Inject 0.2 mLs (20 Units total) into the skin daily. INJECT 20  UNITS UNDER THE SKIN ONCE DAILY. DX:E11.65   lisinopril 40 MG tablet Commonly known as:  ZESTRIL Take 1 tablet (40 mg total) by mouth daily.   metFORMIN 500 MG tablet Commonly known as:  GLUCOPHAGE Take 2 tablets (1,000 mg total) by mouth 2 (two) times daily with a meal.   PEG-KCl-NaCl-NaSulf-Na Asc-C 140 g Solr Commonly known as:  Plenvu Take 140 g by mouth as directed.   pioglitazone 15 MG tablet Commonly known as:  Actos Take 1 tablet (15 mg total) by mouth daily.   rosuvastatin 40 MG tablet Commonly known as:  Crestor Take 1 tablet (40 mg total) by mouth daily.   Travoprost (BAK Free) 0.004 % Soln ophthalmic solution Commonly known as:  TRAVATAN Place 1 drop into both eyes at bedtime.       Allergies: No Known Allergies  Past Medical History:  Diagnosis Date  . Diabetes mellitus without complication ([place])   . Glaucoma   . History of chicken pox   . Hypertension     Past Surgical History:  Procedure Laterality Date  . CATARACT EXTRACTION, BILATERAL    . CESAREAN SECTION    . TUBAL LIGATION      Family History  Problem Relation Age of Onset  . Hypertension Mother   . Diabetes Brother   . CAD Brother   . Hypertension Brother   . Diabetes Brother   . Hypertension Brother   . Colon cancer Neg Hx   . Esophageal cancer Neg Hx   . Pancreatic cancer Neg Hx   . Liver disease Neg Hx   . Stomach cancer Neg Hx     Social History:  reports that she has never smoked. She has never used smokeless tobacco. She reports that she does not drink alcohol or use drugs.   Review of Systems   Lipid history: Currently on Crestor 40 mg and appears to be taking it regularly as lipids are improved   Lab Results  Component Value Date   CHOL 147 02/11/2019   HDL 68.50 02/11/2019   LDLCALC 67 02/11/2019   TRIG 61.0 02/11/2019   CHOLHDL 2 02/11/2019           Hypertension: Has been present for several years on 40 mg dose  lisinopril She does not remember what her  blood pressure readings are, does apparently have a meter at home She previously was on chlorthalidone and not clear when or why this was stopped She is somewhat anxious but her blood pressure appears to be consistently high in the office    BP Readings from Last 3 Encounters:  02/16/19 (!) 160/70  12/25/18 (!) 150/60  12/03/18 (!) 160/80   Renal function stable this year  Lab Results  Component Value Date   CREATININE 1.25 (H) 02/11/2019   CREATININE 1.20  11/03/2018   CREATININE 1.13 10/31/2018    Most recent eye exam was in 5/19  Most recent foot exam: 8/19  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  LABS:  Lab on 02/11/2019  Component Date Value Ref Range Status  . Cholesterol 02/11/2019 147  0 - 200 mg/dL Final   ATP III Classification       Desirable:  < 200 mg/dL               Borderline High:  200 - 239 mg/dL          High:  > = 240 mg/dL  . Triglycerides 02/11/2019 61.0  0.0 - 149.0 mg/dL Final   Normal:  <150 mg/dLBorderline High:  150 - 199 mg/dL  . HDL 02/11/2019 68.50  >39.00 mg/dL Final  . VLDL 02/11/2019 12.2  0.0 - 40.0 mg/dL Final  . LDL Cholesterol 02/11/2019 67  0 - 99 mg/dL Final  . Total CHOL/HDL Ratio 02/11/2019 2   Final                  Men          Women1/2 Average Risk     3.4          3.3Average Risk          5.0          4.42X Average Risk          9.6          7.13X Average Risk          15.0          11.0                      . NonHDL 02/11/2019 78.73   Final   NOTE:  Non-HDL goal should be 30 mg/dL higher than patient's LDL goal (i.e. LDL goal of < 70 mg/dL, would have non-HDL goal of < 100 mg/dL)  . Sodium 02/11/2019 140  135 - 145 mEq/L Final  . Potassium 02/11/2019 4.1  3.5 - 5.1 mEq/L Final  . Chloride 02/11/2019 107  96 - 112 mEq/L Final  . CO2 02/11/2019 25  19 - 32 mEq/L Final  . Glucose, Bld 02/11/2019 109* 70 - 99 mg/dL Final  . BUN 02/11/2019 25* 6 - 23 mg/dL Final  . Creatinine, Ser 02/11/2019 1.25* 0.40 - 1.20  mg/dL Final  . Total Bilirubin 02/11/2019 0.5  0.2 - 1.2 mg/dL Final  . Alkaline Phosphatase 02/11/2019 36* 39 - 117 U/L Final  . AST 02/11/2019 19  0 - 37 U/L Final  . ALT 02/11/2019 11  0 - 35 U/L Final  . Total Protein 02/11/2019 6.8  6.0 - 8.3 g/dL Final  . Albumin 02/11/2019 4.1  3.5 - 5.2 g/dL Final  . Calcium 02/11/2019 9.7  8.4 - 10.5 mg/dL Final  . GFR 02/11/2019 50.99* >60.00 mL/min Final  . Hgb A1c MFr Bld 02/11/2019 9.0* 4.6 - 6.5 % Final   Glycemic Control Guidelines for People with Diabetes:Non Diabetic:  <6%Goal of Therapy: <7%Additional Action Suggested:  >8%     Physical Examination:  BP (!) 160/70 (BP Location: Left Arm, Patient Position: Sitting, Cuff Size: Normal)   Pulse 86   Ht 4\' 11"  (1.499 m)   Wt 91 lb 12.8 oz (41.6 kg)   SpO2 97%   BMI 18.54 kg/m         ASSESSMENT:  Diabetes type 2, insulin-dependent  A1c is slightly better at 9%  See history of present illness for detailed discussion of current diabetes management, blood sugar patterns and problems identified  Her blood sugars are minimally monitored at home and despite using a freestyle libre sensor the data is totally inadequate for adjustment of her mealtime insulin She does do better with Lantus in the morning and less hypoglycemia overnight She may have only occasional low normal readings early morning  Likely has excessive postprandial hyperglycemia as judged by her A1c Discussed A1c results and need for better control as well as need to adjust her mealtime dose based on blood sugars after meals especially breakfast and dinner Discussed glucose targets of at least under 180  HYPERTENSION: Her blood pressure is persistently high Currently only on lisinopril and no changes made by PCP recently Previously on chlorthalidone and not clear why this was stopped  Renal dysfunction: Her creatinine is recently stable at 1.25  LIPIDS: LDL is 67 and reminded her to stay regular with her  rosuvastatin which she is now taking  PLAN:    She will need to check her sugars at least 3 times a day at different times  Increase NovoLog to 7 units  Discussed that her patient instructions need to be followed  Will not reduce her Lantus as yet unless she is having hypoglycemia or more tendency to low normal readings overnight  Balanced meals   Start HCTZ for hypertension, discussed that this should help her blood pressure control with minimal side effects at 12.5 mg dose but needs to be followed with a BMP Check blood pressure regularly at home and keep a record of the results to show on the next visit At least her systolic blood pressure should be under 829 and diastolic under 85  Patient Instructions  INSULIN doses: Continue Lantus/glargine insulin 20 units in the morning daily Increase NovoLog at breakfast and dinnertime to 7 units instead of 5 Take consistently before the start of the meal Call if blood sugars are consistently running over 180 after meals Also let us know if blood sugars are dropping below 70 for no apparent reason  Freestyle libre sensor:  This does not retain the blood sugar data for more than 8 hours so we need to have you check your sugars before and after each meal and some midday to get at least 3 or 4 readings per day consistently  Check blood pressure weekly and call if consistently over 140 on the top or 85 on the bottom  Will be starting you on a new blood pressure pill with hydrochlorothiazide 12.5 mg in addition to lisinopril       Elayne Snare 02/16/2019, 9:10 PM   Note: This office note was prepared with Dragon voice recognition system technology. Any transcriptional errors that result from this process are unintentional.

## 2019-02-16 NOTE — Patient Instructions (Signed)
INSULIN doses: Continue Lantus/glargine insulin 20 units in the morning daily Increase NovoLog at breakfast and dinnertime to 7 units instead of 5 Take consistently before the start of the meal Call if blood sugars are consistently running over 180 after meals Also let us know if blood sugars are dropping below 70 for no apparent reason  Freestyle libre sensor:  This does not retain the blood sugar data for more than 8 hours so we need to have you check your sugars before and after each meal and some midday to get at least 3 or 4 readings per day consistently  Check blood pressure weekly and call if consistently over 140 on the top or 85 on the bottom  Will be starting you on a new blood pressure pill with hydrochlorothiazide 12.5 mg in addition to lisinopril

## 2019-02-17 ENCOUNTER — Ambulatory Visit: Payer: Medicare Other

## 2019-03-02 ENCOUNTER — Telehealth: Payer: Self-pay | Admitting: *Deleted

## 2019-03-02 NOTE — Telephone Encounter (Signed)
Called pt and Lm to return call for [place] to RS her colon that was cancelled due to COVID 19. LM to call [phone]. Mariah Peterson - pt had an OV march 2020 with Dr Rod Mae PV

## 2019-03-12 ENCOUNTER — Other Ambulatory Visit: Payer: Self-pay | Admitting: Endocrinology

## 2019-03-20 ENCOUNTER — Other Ambulatory Visit: Payer: Self-pay

## 2019-03-20 MED ORDER — FREESTYLE LIBRE 14 DAY SENSOR MISC: 1.0000 | 5 refills | Status: DC

## 2019-03-31 ENCOUNTER — Encounter: Payer: Self-pay | Admitting: Family

## 2019-03-31 ENCOUNTER — Ambulatory Visit (INDEPENDENT_AMBULATORY_CARE_PROVIDER_SITE_OTHER): Payer: Medicare Other | Admitting: Family

## 2019-03-31 ENCOUNTER — Other Ambulatory Visit: Payer: Self-pay

## 2019-03-31 VITALS — BP 140/70 | HR 91 | Temp 98.9°F | Ht 59.0 in | Wt 91.1 lb

## 2019-03-31 DIAGNOSIS — E2839 Other primary ovarian failure: Secondary | ICD-10-CM

## 2019-03-31 DIAGNOSIS — I10 Essential (primary) hypertension: Secondary | ICD-10-CM

## 2019-03-31 DIAGNOSIS — E1169 Type 2 diabetes mellitus with other specified complication: Secondary | ICD-10-CM

## 2019-03-31 DIAGNOSIS — E785 Hyperlipidemia, unspecified: Secondary | ICD-10-CM

## 2019-03-31 DIAGNOSIS — H409 Unspecified glaucoma: Secondary | ICD-10-CM | POA: Diagnosis not present

## 2019-03-31 DIAGNOSIS — Z794 Long term (current) use of insulin: Secondary | ICD-10-CM

## 2019-03-31 NOTE — Progress Notes (Signed)
Mariah Peterson is a 73 y.o. female with the following history as recorded in Epic[employer]:  Patient Active Problem List   Diagnosis Date Noted  . Glaucoma 03/31/2019  . Healthcare maintenance 12/03/2018  . Anemia 12/03/2018  . Hypertension 10/31/2018  . Hyperlipidemia 08/04/2013  . Diabetes mellitus ([place]) 08/04/2013    Current Outpatient Medications  Medication Sig Dispense Refill  . Continuous Blood Gluc Sensor (FREESTYLE LIBRE 14 DAY SENSOR) MISC Inject 1 each into the skin every 14 (fourteen) days. 2 each 5  . glucose blood test strip Use as instructed to test blood sugar 3 times daily E11.65 270 each 1  . insulin aspart (NOVOLOG) 100 UNIT/ML FlexPen Inject 5 Units into the skin 3 (three) times daily with meals. INJECT 5 UNITS UNDER THE SKIN THREE TIMES DAILY WITH MEALS. (Patient taking differently: Inject 6 Units into the skin 3 (three) times daily with meals. INJECT 5 UNITS UNDER THE SKIN THREE TIMES DAILY WITH MEALS. ) 5 pen 1  . insulin glargine (LANTUS) 100 unit/mL SOPN Inject 0.2 mLs (20 Units total) into the skin daily. INJECT 20 UNITS UNDER THE SKIN ONCE DAILY. DX:E11.65 6 pen 1  . lisinopril (PRINIVIL,ZESTRIL) 40 MG tablet Take 1 tablet (40 mg total) by mouth daily. 90 tablet 1  . metFORMIN (GLUCOPHAGE) 500 MG tablet Take 2 tablets (1,000 mg total) by mouth 2 (two) times daily with a meal. 360 tablet 1  . PEG-KCl-NaCl-NaSulf-Na Asc-C (PLENVU) 140 g SOLR Take 140 g by mouth as directed. 1 each 0  . pioglitazone (ACTOS) 15 MG tablet Take 1 tablet (15 mg total) by mouth daily. 90 tablet 1  . rosuvastatin (CRESTOR) 40 MG tablet Take 1 tablet (40 mg total) by mouth daily. 90 tablet 1  . Travoprost, BAK Free, (TRAVATAN) 0.004 % SOLN ophthalmic solution Place 1 drop into both eyes at bedtime. 2.5 mL 1   No current facility-administered medications for this visit.     Allergies: Patient has no known allergies.  Past Medical History:  Diagnosis Date  . Diabetes mellitus without  complication ([place])   . Glaucoma   . History of chicken pox   . Hypertension     Past Surgical History:  Procedure Laterality Date  . CATARACT EXTRACTION, BILATERAL    . CESAREAN SECTION    . TUBAL LIGATION      Family History  Problem Relation Age of Onset  . Hypertension Mother   . Diabetes Brother   . CAD Brother   . Hypertension Brother   . Diabetes Brother   . Hypertension Brother   . Colon cancer Neg Hx   . Esophageal cancer Neg Hx   . Pancreatic cancer Neg Hx   . Liver disease Neg Hx   . Stomach cancer Neg Hx     Social History   Tobacco Use  . Smoking status: Never Smoker  . Smokeless tobacco: Never Used  Substance Use Topics  . Alcohol use: No    Subjective:  Transfer of Care- patient's provider has recently left the office; history of hypertension, Type 2 Diabetes; works with endocrinology for management of Type 2 Diabetes; Denies any chest pain, shortness of breath, blurred vision or headache; Overdue for colonoscopy- had to be re-scheduled due to COVID; mammogram is scheduled for next week- needs DEXA as well;    Objective:  Vitals:   03/31/19 0912  BP: 140/70  Pulse: 91  Temp: 98.9 F (37.2 C)  TempSrc: Oral  SpO2: 99%  Weight: 91 lb  1.9 oz (41.3 kg)  Height: 4\' 11"  (1.499 m)    General: Well developed, well nourished, in no acute distress  Skin : Warm and dry.  Head: Normocephalic and atraumatic  Lungs: Respirations unlabored; clear to auscultation bilaterally without wheeze, rales, rhonchi  CVS exam: normal rate and regular rhythm.  Neurologic: Alert and oriented; speech intact; face symmetrical; moves all extremities well; CNII-XII intact without focal deficit   Assessment:  1. Hypertension, unspecified type   2. Glaucoma of both eyes, unspecified glaucoma type   3. Hyperlipidemia, unspecified hyperlipidemia type   4. Type 2 diabetes mellitus with other specified complication, with long-term current use of insulin ([place])   5. Ovarian failure      Plan:  1. Stable; continue same medications; 2. Continue with ophthalmologist as scheduled; 3. & 4. Keep planned follow-up with endocrinology; 5. Update order for mammogram;  Encouraged to re-schedule colonoscopy- patient is given # to schedule; Follow-up in 6 months for AWV and OV;   No follow-ups on file.  Orders Placed This Encounter  Procedures  . DG BONE DENSITY (DXA)    Standing Status:   Future    Standing Expiration Date:   05/30/2020    Scheduling Instructions:     Please schedule with upcoming mammogram at the [facility] on 04/09/2019    Order Specific Question:   Reason for Exam (SYMPTOM  OR DIAGNOSIS REQUIRED)    Answer:   osteopenia/ ovarian failure    Order Specific Question:   Preferred imaging location?    Answer:   [facility]    Requested Prescriptions    No prescriptions requested or ordered in this encounter

## 2019-03-31 NOTE — Patient Instructions (Signed)
Please schedule a follow-up with Dr. Loletha Carrow regarding your colonoscopy: [phone];

## 2019-04-01 ENCOUNTER — Telehealth: Payer: Self-pay | Admitting: Endocrinology

## 2019-04-01 NOTE — Telephone Encounter (Signed)
Deneise Lever from [facility] ph# [phone] called re: status of receipt of fax she sent on 03/31/19. Please call the above ph# to advise.

## 2019-04-01 NOTE — Telephone Encounter (Signed)
Attempted to call this number below 4 times. The phone would never ring, however, all paperwork is completed in the order in which it is received, and the turn around time for paperwork is 7-10 business days.

## 2019-04-02 ENCOUNTER — Encounter: Payer: Self-pay | Admitting: Internal Medicine

## 2019-04-03 ENCOUNTER — Telehealth: Payer: Self-pay

## 2019-04-03 NOTE — Telephone Encounter (Signed)
[name] called back to advise if the paperwork will be signed for back brace and knee brace.  Advised I could not provide information if the dr will sign this order or not. They have also called Dr Dwyane Dee office with this same request, per office notes cb [phone]

## 2019-04-03 NOTE — Telephone Encounter (Signed)
Paperwork received yesterday but Mariah Peterson and I were both out. Tried to return call to number listed but phone just rings with no option to leave a message.

## 2019-04-06 ENCOUNTER — Other Ambulatory Visit: Payer: Self-pay

## 2019-04-06 ENCOUNTER — Encounter (INDEPENDENT_AMBULATORY_CARE_PROVIDER_SITE_OTHER): Payer: Medicare Other | Admitting: Ophthalmology

## 2019-04-06 DIAGNOSIS — H43813 Vitreous degeneration, bilateral: Secondary | ICD-10-CM

## 2019-04-06 DIAGNOSIS — I10 Essential (primary) hypertension: Secondary | ICD-10-CM

## 2019-04-06 DIAGNOSIS — E11319 Type 2 diabetes mellitus with unspecified diabetic retinopathy without macular edema: Secondary | ICD-10-CM

## 2019-04-06 DIAGNOSIS — E113293 Type 2 diabetes mellitus with mild nonproliferative diabetic retinopathy without macular edema, bilateral: Secondary | ICD-10-CM | POA: Diagnosis not present

## 2019-04-06 DIAGNOSIS — H35033 Hypertensive retinopathy, bilateral: Secondary | ICD-10-CM | POA: Diagnosis not present

## 2019-04-07 ENCOUNTER — Other Ambulatory Visit: Payer: Self-pay | Admitting: Endocrinology

## 2019-04-08 ENCOUNTER — Other Ambulatory Visit: Payer: Self-pay | Admitting: Family

## 2019-04-08 DIAGNOSIS — M8580 Other specified disorders of bone density and structure, unspecified site: Secondary | ICD-10-CM

## 2019-04-08 DIAGNOSIS — E2839 Other primary ovarian failure: Secondary | ICD-10-CM

## 2019-04-09 ENCOUNTER — Other Ambulatory Visit: Payer: Self-pay

## 2019-04-09 ENCOUNTER — Ambulatory Visit
Admission: RE | Admit: 2019-04-09 | Discharge: 2019-04-09 | Disposition: A | Discharge status: Home / Self Care | Payer: Medicare Other | Source: Ambulatory Visit | Attending: Nurse Practitioner | Admitting: Nurse Practitioner

## 2019-04-09 DIAGNOSIS — Z0000 Encounter for general adult medical examination without abnormal findings: Secondary | ICD-10-CM

## 2019-04-09 DIAGNOSIS — Z1231 Encounter for screening mammogram for malignant neoplasm of breast: Secondary | ICD-10-CM

## 2019-04-27 ENCOUNTER — Other Ambulatory Visit: Payer: Self-pay

## 2019-04-27 ENCOUNTER — Other Ambulatory Visit (INDEPENDENT_AMBULATORY_CARE_PROVIDER_SITE_OTHER): Payer: Medicare Other

## 2019-04-27 DIAGNOSIS — E1165 Type 2 diabetes mellitus with hyperglycemia: Secondary | ICD-10-CM

## 2019-04-27 DIAGNOSIS — Z794 Long term (current) use of insulin: Secondary | ICD-10-CM | POA: Diagnosis not present

## 2019-04-27 LAB — BASIC METABOLIC PANEL
BUN: 29 mg/dL — ABNORMAL HIGH (ref 6–23)
CO2: 21 mEq/L (ref 19–32)
Calcium: 9.6 mg/dL (ref 8.4–10.5)
Chloride: 106 mEq/L (ref 96–112)
Creatinine, Ser: 1.7 mg/dL — ABNORMAL HIGH (ref 0.40–1.20)
GFR: 35.74 mL/min — ABNORMAL LOW (ref >60.00)
Glucose, Bld: 108 mg/dL — ABNORMAL HIGH (ref 70–99)
Potassium: 4.1 mEq/L (ref 3.5–5.1)
Sodium: 139 mEq/L (ref 135–145)

## 2019-04-30 ENCOUNTER — Other Ambulatory Visit: Payer: Self-pay

## 2019-04-30 ENCOUNTER — Ambulatory Visit (AMBULATORY_SURGERY_CENTER): Payer: Self-pay | Admitting: *Deleted

## 2019-04-30 VITALS — Ht 59.0 in | Wt 92.0 lb

## 2019-04-30 DIAGNOSIS — D649 Anemia, unspecified: Secondary | ICD-10-CM

## 2019-04-30 DIAGNOSIS — Z1211 Encounter for screening for malignant neoplasm of colon: Secondary | ICD-10-CM

## 2019-04-30 LAB — FRUCTOSAMINE

## 2019-04-30 NOTE — Progress Notes (Signed)
Patient's pre-visit was done today over the phone with the patient due to COVID-19 pandemic. Patient denies any changes in medical, surgical or family hx since GI OV. Name,DOB and address verified. Insurance verified. Packet of Prep instructions mailed to patient including copy of a consent form and pre-procedure patient acknowledgement form-pt is aware. Patient has plenvu at home. Patient understands to call us back with any questions or concerns. Patient denies any allergies to eggs or soy. Patient denies any problems with anesthesia/sedation. Patient denies any oxygen use at home. Patient denies taking any diet/weight loss medications or blood thinners. EMMI education assisgned to patient on colonoscopy, this was explained and instructions given to patient. Pt is aware that care partner will wait in the car during proceudre; if they feel like they will be too hot to wait in the car; they may wait in the lobby.  We want them to wear a mask (we do not have any that we can provide them), practice social distancing, and we will check their temperatures when they get here.  I did remind patient that their care partner needs to stay in the parking lot the entire time. Pt will wear mask into building.

## 2019-05-01 ENCOUNTER — Ambulatory Visit (INDEPENDENT_AMBULATORY_CARE_PROVIDER_SITE_OTHER): Payer: Medicare Other | Admitting: Endocrinology

## 2019-05-01 ENCOUNTER — Other Ambulatory Visit: Payer: Self-pay | Admitting: Endocrinology

## 2019-05-01 ENCOUNTER — Encounter: Payer: Self-pay | Admitting: Endocrinology

## 2019-05-01 ENCOUNTER — Other Ambulatory Visit: Payer: Self-pay

## 2019-05-01 DIAGNOSIS — Z794 Long term (current) use of insulin: Secondary | ICD-10-CM | POA: Diagnosis not present

## 2019-05-01 DIAGNOSIS — E1165 Type 2 diabetes mellitus with hyperglycemia: Secondary | ICD-10-CM | POA: Diagnosis not present

## 2019-05-01 NOTE — Progress Notes (Signed)
Patient ID: Mariah Peterson, female   DOB: December 05, 1945, 73 y.o.   MRN: [id number]          Reason for Appointment: Follow-up for Type 2 Diabetes  Today's office visit was provided via telemedicine using a telephone call to the patient Patient has been explained the limitations of evaluation and management by telemedicine and the availability of in person appointments.  The patient understood the limitations and agreed to proceed. Patient also understood that the telehealth visit is billable. . Location of the patient: Home . Location of the provider: Office Only the patient and myself were participating in the encounter  History of Present Illness:          Date of diagnosis of type 2 diabetes mellitus:?  1990       Background history:   She was started on insulin about 4 years ago and previously taking metformin and glipizide No detailed history is available and she does not think she has taken any other types of diabetes medications or injections  Recent history:    INSULIN regimen [id number] 20 units daily in am, Novolog 5 units bid      Non-insulin hypoglycemic drugs the patient is taking are: Metformin 500 mg twice daily, Actos 15 mg daily  Current management, blood sugar patterns and problems identified:  Her A1c last was 9%  Fructosamine could not be run on her blood specimen this time  Even with checking her blood sugar using the freestyle libre she has only sporadic readings and not checking every day also despite reminders on the last visit to check consistently  She was told to increase her NOVOLOG to 7 units but she is still taking 5 units  Daughter has been helping her with her diabetes management but is again not available today to help with a history  However she does not appear to have significantly high blood sugars although blood sugar may be relatively high at times before dinner  FASTING readings are fairly good  No hypoglycemia  She thinks she is taking  her insulin on time  Also on metformin and Actos  She was seen by diabetes educator in 9/19       Side effects from medications have been: None  Compliance with the medical regimen: Fair  Typical meal intake: Breakfast is cereal or oatmeal, otherwise eggs and toast.  Lunch may be rice and beans or a sandwich Fruit for snacks               Exercise:  Being active with her grandson  Glucose monitoring:  done 1 times a day         Glucometer: Freestyle libre       PRE-MEAL Fasting Lunch Dinner Bedtime Overall  Glucose range:  94-137   125-159    Mean/median:      150   POST-MEAL PC Breakfast PC Lunch PC Dinner  Glucose range:   150 ?  Mean/median:       PREVIOUS DATA:   Blood Glucose analysis by download of freestyle libre:  CGM use % of time  35  2-week average/SD  135  Time in range    82    %  % Time Above 180  14  % Time above 250 1  % Time Below 70 3     PRE-MEAL Fasting Lunch Dinner Bedtime Overall  Glucose range:       Averages:  115  186    135  POST-MEAL PC Breakfast PC Lunch PC Dinner  Glucose range:     Averages:  197   238    Dietician visit, most recent: None CDE consultation: 07/01/2018  Weight history:  Wt Readings from Last 3 Encounters:  04/30/19 92 lb (41.7 kg)  03/31/19 91 lb 1.9 oz (41.3 kg)  02/16/19 91 lb 12.8 oz (41.6 kg)    Glycemic control:   Lab Results  Component Value Date   HGBA1C 9.0 (H) 02/11/2019   HGBA1C 10.5 (H) 09/17/2018   HGBA1C 10.4 (A) 06/18/2018   Lab Results  Component Value Date   MICROALBUR 8.1 (H) 07/10/2018   LDLCALC 67 02/11/2019   CREATININE 1.70 (H) 04/27/2019   Lab Results  Component Value Date   MICRALBCREAT 11.4 07/10/2018    Lab Results  Component Value Date   FRUCTOSAMINE CANCELED 04/27/2019   FRUCTOSAMINE 369 (H) 11/03/2018   FRUCTOSAMINE 299 (H) 07/10/2018    Lab on 04/27/2019  Component Date Value Ref Range Status  . Fructosamine 04/27/2019 CANCELED  umol/L Final-Edited    Comment: Quantity was not sufficient for analysis. Published reference interval for apparently healthy subjects between age 7 and 51 is 28 - 285 umol/L and in a poorly controlled diabetic population is 228 - 563 umol/L with a mean of 396 umol/L.  Result canceled by the ancillary.   . Sodium 04/27/2019 139  135 - 145 mEq/L Final  . Potassium 04/27/2019 4.1  3.5 - 5.1 mEq/L Final  . Chloride 04/27/2019 106  96 - 112 mEq/L Final  . CO2 04/27/2019 21  19 - 32 mEq/L Final  . Glucose, Bld 04/27/2019 108* 70 - 99 mg/dL Final  . BUN 04/27/2019 29* 6 - 23 mg/dL Final  . Creatinine, Ser 04/27/2019 1.70* 0.40 - 1.20 mg/dL Final  . Calcium 04/27/2019 9.6  8.4 - 10.5 mg/dL Final  . GFR 04/27/2019 35.74* >60.00 mL/min Final    Allergies as of 05/01/2019   No Known Allergies     Medication List       Accurate as of May 01, 2019  8:04 AM. If you have any questions, ask your nurse or doctor.        FreeStyle Libre 14 Day Sensor Misc Inject 1 each into the skin every 14 (fourteen) days.   glucose blood test strip Use as instructed to test blood sugar 3 times daily E11.65   insulin aspart 100 UNIT/ML FlexPen Commonly known as: NOVOLOG Inject 5 Units into the skin 3 (three) times daily with meals. INJECT 5 UNITS UNDER THE SKIN THREE TIMES DAILY WITH MEALS. What changed: how much to take   insulin glargine 100 unit/mL Sopn Commonly known as: LANTUS Inject 0.2 mLs (20 Units total) into the skin daily. INJECT 20 UNITS UNDER THE SKIN ONCE DAILY. DX:E11.65   lisinopril 40 MG tablet Commonly known as: ZESTRIL Take 1 tablet (40 mg total) by mouth daily.   metFORMIN 500 MG tablet Commonly known as: GLUCOPHAGE TAKE 2 TABLETS TWICE DAILY WITH A MEAL   PEG-KCl-NaCl-NaSulf-Na Asc-C 140 g Solr Commonly known as: Plenvu Take 140 g by mouth as directed.   pioglitazone 15 MG tablet Commonly known as: Actos Take 1 tablet (15 mg total) by mouth daily.   rosuvastatin 40 MG tablet Commonly  known as: CRESTOR TAKE 1 TABLET (40 MG TOTAL) BY MOUTH DAILY.   Travoprost (BAK Free) 0.004 % Soln ophthalmic solution Commonly known as: TRAVATAN Place 1 drop into both eyes at bedtime.  Allergies: No Known Allergies  Past Medical History:  Diagnosis Date  . Diabetes mellitus without complication ([place])   . Glaucoma   . History of chicken pox   . Hypertension     Past Surgical History:  Procedure Laterality Date  . BREAST BIOPSY Right 2017   benign  . CATARACT EXTRACTION, BILATERAL    . CESAREAN SECTION    . COLONOSCOPY  1980's  . TUBAL LIGATION      Family History  Problem Relation Age of Onset  . Hypertension Mother   . Diabetes Brother   . CAD Brother   . Hypertension Brother   . Diabetes Brother   . Hypertension Brother   . Colon cancer Neg Hx   . Esophageal cancer Neg Hx   . Pancreatic cancer Neg Hx   . Liver disease Neg Hx   . Stomach cancer Neg Hx   . Rectal cancer Neg Hx   . Colon polyps Neg Hx     Social History:  reports that she has never smoked. She has never used smokeless tobacco. She reports that she does not drink alcohol or use drugs.   Review of Systems   Lipid history: Currently on Crestor 40 mg and appears to be taking it regularly as lipids are improved   Lab Results  Component Value Date   CHOL 147 02/11/2019   HDL 68.50 02/11/2019   LDLCALC 67 02/11/2019   TRIG 61.0 02/11/2019   CHOLHDL 2 02/11/2019           Hypertension: Has been present for several years on 40 mg dose  lisinopril She does not remember what her blood pressure readings are, does apparently have a meter at home She previously was on chlorthalidone and not clear when or why this was stopped She is somewhat anxious but her blood pressure appears to be consistently high in the office    BP Readings from Last 3 Encounters:  03/31/19 140/70  02/16/19 (!) 160/70  12/25/18 (!) 150/60   Renal function appears to be worse now, otherwise was stable this  year  Lab Results  Component Value Date   CREATININE 1.70 (H) 04/27/2019   CREATININE 1.25 (H) 02/11/2019   CREATININE 1.20 11/03/2018    Most recent eye exam was in 5/19  Most recent foot exam: 8/19  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  LABS:  Lab on 04/27/2019  Component Date Value Ref Range Status  . Fructosamine 04/27/2019 CANCELED  umol/L Final-Edited   Comment: Quantity was not sufficient for analysis. Published reference interval for apparently healthy subjects between age 50 and 53 is 53 - 285 umol/L and in a poorly controlled diabetic population is 228 - 563 umol/L with a mean of 396 umol/L.  Result canceled by the ancillary.   . Sodium 04/27/2019 139  135 - 145 mEq/L Final  . Potassium 04/27/2019 4.1  3.5 - 5.1 mEq/L Final  . Chloride 04/27/2019 106  96 - 112 mEq/L Final  . CO2 04/27/2019 21  19 - 32 mEq/L Final  . Glucose, Bld 04/27/2019 108* 70 - 99 mg/dL Final  . BUN 04/27/2019 29* 6 - 23 mg/dL Final  . Creatinine, Ser 04/27/2019 1.70* 0.40 - 1.20 mg/dL Final  . Calcium 04/27/2019 9.6  8.4 - 10.5 mg/dL Final  . GFR 04/27/2019 35.74* >60.00 mL/min Final    Physical Examination:  There were no vitals taken for this visit.        ASSESSMENT:  Diabetes type 2,  insulin-dependent    A1c is last 9%  See history of present illness for discussion of current diabetes management, blood sugar patterns and problems identified Even with using freestyle libre she is checking her blood sugars only sporadically No consistent blood sugar patterns seen No hypoglycemia also  HYPERTENSION: Her blood pressure is mostly high She was supposed to start HCTZ but do not see this on her list and patient does not know whether she is taking this  Renal dysfunction: Her creatinine is worse again and not clear why her creatinine is down to 1.7 This may be related to her blood pressure overall coming down However needs further evaluation  PLAN:    She will start checking her blood sugars 3 times a day or more especially after dinner She will call if he has consistently high reading Needs to have her fructosamine repeated, currently pending No change in insulin at this time, however advised her that if her blood sugars start going up with stopping metformin she will let us know  Stop metformin  She needs to follow-up with her PCP regarding abnormal renal function  There are no Patient Instructions on file for this visit.   Phone call: Duration =6 minutes  Elayne Snare 05/01/2019, 8:04 AM   Note: This office note was prepared with Dragon voice recognition system technology. Any transcriptional errors that result from this process are unintentional.

## 2019-05-03 LAB — FRUCTOSAMINE: Fructosamine: 524 umol/L — ABNORMAL HIGH (ref 0–285)

## 2019-05-03 LAB — SPECIMEN STATUS REPORT

## 2019-05-04 ENCOUNTER — Telehealth: Payer: Self-pay | Admitting: Family

## 2019-05-04 DIAGNOSIS — R7989 Other specified abnormal findings of blood chemistry: Secondary | ICD-10-CM

## 2019-05-04 NOTE — Telephone Encounter (Signed)
-----   Message from Elayne Snare, MD sent at 05/01/2019  9:11 AM EDT ----- Her renal function is worse, I have asked her to follow-up with you to be evaluated.  Thanks, I have stopped her metformin.

## 2019-05-04 NOTE — Telephone Encounter (Signed)
Please ask her to come get her labs re-checked in about 2 weeks to follow-up on the elevated kidney functions; Hopefully, the changes she made with her endocrinologist with cause the labs to normalize. Lab is in place.

## 2019-05-04 NOTE — Telephone Encounter (Signed)
Spoke with patient and info given. She is having colonoscopy done last week of July so she will plan to come the week after.

## 2019-05-14 ENCOUNTER — Telehealth: Payer: Self-pay | Admitting: Endocrinology

## 2019-05-14 ENCOUNTER — Telehealth: Payer: Self-pay | Admitting: Gastroenterology

## 2019-05-14 NOTE — Telephone Encounter (Signed)

## 2019-05-14 NOTE — Telephone Encounter (Signed)
[employer] called to confirm if we received there paperwork for this patient. Please call number below if not. [phone]

## 2019-05-15 ENCOUNTER — Ambulatory Visit (AMBULATORY_SURGERY_CENTER): Payer: Medicare Other | Admitting: Gastroenterology

## 2019-05-15 ENCOUNTER — Encounter: Payer: Self-pay | Admitting: Gastroenterology

## 2019-05-15 ENCOUNTER — Other Ambulatory Visit: Payer: Self-pay

## 2019-05-15 VITALS — BP 125/64 | HR 68 | Temp 98.7°F | Resp 45 | Ht 59.0 in | Wt 92.0 lb

## 2019-05-15 DIAGNOSIS — Z1211 Encounter for screening for malignant neoplasm of colon: Secondary | ICD-10-CM

## 2019-05-15 MED ORDER — SODIUM CHLORIDE 0.9 % IV SOLN: 500.0000 mL | Freq: Once | INTRAVENOUS | Status: DC

## 2019-05-15 NOTE — Progress Notes (Signed)
A and O x3. Report to RN. Tolerated MAC anesthesia well.

## 2019-05-15 NOTE — Patient Instructions (Signed)
YOU HAD AN ENDOSCOPIC PROCEDURE TODAY AT THE [facility]:   Refer to the procedure report that was given to you for any specific questions about what was found during the examination.  If the procedure report does not answer your questions, please call your gastroenterologist to clarify.  If you requested that your care partner not be given the details of your procedure findings, then the procedure report has been included in a sealed envelope for you to review at your convenience later.  YOU SHOULD EXPECT: Some feelings of bloating in the abdomen. Passage of more gas than usual.  Walking can help get rid of the air that was put into your GI tract during the procedure and reduce the bloating. If you had a lower endoscopy (such as a colonoscopy or flexible sigmoidoscopy) you may notice spotting of blood in your stool or on the toilet paper. If you underwent a bowel prep for your procedure, you may not have a normal bowel movement for a few days.  Please Note:  You might notice some irritation and congestion in your nose or some drainage.  This is from the oxygen used during your procedure.  There is no need for concern and it should clear up in a day or so.  SYMPTOMS TO REPORT IMMEDIATELY:   Following lower endoscopy (colonoscopy or flexible sigmoidoscopy):  Excessive amounts of blood in the stool  Significant tenderness or worsening of abdominal pains  Swelling of the abdomen that is new, acute  Fever of 100F or higher  For urgent or emergent issues, a gastroenterologist can be reached at any hour by calling [phone].   DIET:  We do recommend a small meal at first, but then you may proceed to your regular diet.  Drink plenty of fluids but you should avoid alcoholic beverages for 24 hours.  ACTIVITY:  You should plan to take it easy for the rest of today and you should NOT DRIVE or use heavy machinery until tomorrow (because of the sedation medicines used during the test).     FOLLOW UP: Our staff will call the number listed on your records 48-72 hours following your procedure to check on you and address any questions or concerns that you may have regarding the information given to you following your procedure. If we do not reach you, we will leave a message.  We will attempt to reach you two times.  During this call, we will ask if you have developed any symptoms of COVID 19. If you develop any symptoms (ie: fever, flu-like symptoms, shortness of breath, cough etc.) before then, please call [phone].  If you test positive for Covid 19 in the 2 weeks post procedure, please call and report this information to us.    If any biopsies were taken you will be contacted by phone or by letter within the next 1-3 weeks.  Please call us at [phone] if you have not heard about the biopsies in 3 weeks.    SIGNATURES/CONFIDENTIALITY: You and/or your care partner have signed paperwork which will be entered into your electronic medical record.  These signatures attest to the fact that that the information above on your After Visit Summary has been reviewed and is understood.  Full responsibility of the confidentiality of this discharge information lies with you and/or your care-partner. 

## 2019-05-15 NOTE — Op Note (Signed)
[facility] Patient Name: Mariah Peterson Procedure Date: 05/15/2019 8:29 AM MRN: [id number] Endoscopist: [name] L. Loletha Carrow , MD Age: 73 Referring MD:  Date of Birth: [birth year] Gender: Female Account #: [id number] Procedure:                Colonoscopy Indications:              Screening for colorectal malignant neoplasm (normal                            colonoscopy > 10 years ago) Medicines:                Monitored Anesthesia Care Procedure:                Pre-Anesthesia Assessment:                           - Prior to the procedure, a History and Physical                            was performed, and patient medications and                            allergies were reviewed. The patient's tolerance of                            previous anesthesia was also reviewed. The risks                            and benefits of the procedure and the sedation                            options and risks were discussed with the patient.                            All questions were answered, and informed consent                            was obtained. Prior Anticoagulants: The patient has                            taken no previous anticoagulant or antiplatelet                            agents. ASA Grade Assessment: III - A patient with                            severe systemic disease. After reviewing the risks                            and benefits, the patient was deemed in                            satisfactory condition to undergo the procedure.  After obtaining informed consent, the colonoscope                            was passed under direct vision. Throughout the                            procedure, the patient's blood pressure, pulse, and                            oxygen saturations were monitored continuously. The                            Colonoscope was introduced through the anus and                            advanced to the the terminal  ileum, with                            identification of the appendiceal orifice and IC                            valve. The colonoscopy was performed without                            difficulty. The patient tolerated the procedure                            well. The quality of the bowel preparation was                            excellent. The terminal ileum, ileocecal valve,                            appendiceal orifice, and rectum were photographed. Scope In: 8:40:52 AM Scope Out: 8:56:39 AM Scope Withdrawal Time: 0 hours 9 minutes 19 seconds  Total Procedure Duration: 0 hours 15 minutes 47 seconds  Findings:                 The perianal and digital rectal examinations were                            normal.                           The terminal ileum appeared normal.                           The entire examined colon appeared normal on direct                            and retroflexion views. Complications:            No immediate complications. Estimated Blood Loss:     Estimated blood loss: none. Impression:               - The examined portion of the ileum  was normal.                           - The entire examined colon is normal on direct and                            retroflexion views.                           - No specimens collected. Recommendation:           - Patient has a contact number available for                            emergencies. The signs and symptoms of potential                            delayed complications were discussed with the                            patient. Return to normal activities tomorrow.                            Written discharge instructions were provided to the                            patient.                           - Resume previous diet.                           - Continue present medications.                           - Based on current guidelines. no repeat screening                            colonoscopy due to  current age and the absence of                            colonic polyps. Henry L. Loletha Carrow, MD 05/15/2019 9:02:32 AM This report has been signed electronically.

## 2019-05-15 NOTE — Progress Notes (Signed)
Pt's states no medical or surgical changes since previsit or office visit. 

## 2019-05-15 NOTE — Telephone Encounter (Signed)
Attempted to call this number twice. No answer was received and automated service stated that "this is not a valid extension. Please try your call again."

## 2019-05-19 ENCOUNTER — Telehealth: Payer: Self-pay | Admitting: *Deleted

## 2019-05-19 NOTE — Telephone Encounter (Signed)
1. Have you developed a fever since your procedure? no  2.   Have you had an respiratory symptoms (SOB or cough) since your procedure? no  3.   Have you tested positive for COVID 19 since your procedure no  4.   Have you had any family members/close contacts diagnosed with the COVID 19 since your procedure?  no   If yes to any of these questions please route to Joylene John, RN and Alphonsa Gin, Therapist, sports.  Follow up Call-  Call back number 05/15/2019  Post procedure Call Back phone  # [phone]  Permission to leave phone message Yes  Some recent data might be hidden     Patient questions:  Do you have a fever, pain , or abdominal swelling? No. Pain Score  0 *  Have you tolerated food without any problems? Yes.    Have you been able to return to your normal activities? Yes.    Do you have any questions about your discharge instructions: Diet   No. Medications  No. Follow up visit  No.  Do you have questions or concerns about your Care? No.  Actions: * If pain score is 4 or above: No action needed, pain <4.

## 2019-06-04 ENCOUNTER — Ambulatory Visit: Payer: Medicare Other | Admitting: Nurse Practitioner

## 2019-06-22 ENCOUNTER — Other Ambulatory Visit: Payer: Self-pay

## 2019-06-22 ENCOUNTER — Ambulatory Visit
Admission: RE | Admit: 2019-06-22 | Discharge: 2019-06-22 | Disposition: A | Discharge status: Home / Self Care | Payer: Medicare Other | Source: Ambulatory Visit | Attending: Family | Admitting: Family

## 2019-06-22 DIAGNOSIS — M8580 Other specified disorders of bone density and structure, unspecified site: Secondary | ICD-10-CM

## 2019-06-22 DIAGNOSIS — E2839 Other primary ovarian failure: Secondary | ICD-10-CM

## 2019-07-06 ENCOUNTER — Other Ambulatory Visit (INDEPENDENT_AMBULATORY_CARE_PROVIDER_SITE_OTHER): Payer: Medicare Other

## 2019-07-06 ENCOUNTER — Other Ambulatory Visit: Payer: Self-pay

## 2019-07-06 DIAGNOSIS — E1165 Type 2 diabetes mellitus with hyperglycemia: Secondary | ICD-10-CM | POA: Diagnosis not present

## 2019-07-06 DIAGNOSIS — Z794 Long term (current) use of insulin: Secondary | ICD-10-CM | POA: Diagnosis not present

## 2019-07-06 LAB — HEMOGLOBIN A1C: Hgb A1c MFr Bld: 9.3 % — ABNORMAL HIGH (ref 4.6–6.5)

## 2019-07-06 LAB — COMPREHENSIVE METABOLIC PANEL
ALT: 11 U/L (ref 0–35)
AST: 18 U/L (ref 0–37)
Albumin: 3.9 g/dL (ref 3.5–5.2)
Alkaline Phosphatase: 43 U/L (ref 39–117)
BUN: 32 mg/dL — ABNORMAL HIGH (ref 6–23)
CO2: 26 mEq/L (ref 19–32)
Calcium: 9.9 mg/dL (ref 8.4–10.5)
Chloride: 105 mEq/L (ref 96–112)
Creatinine, Ser: 1.39 mg/dL — ABNORMAL HIGH (ref 0.40–1.20)
GFR: 45.06 mL/min — ABNORMAL LOW (ref >60.00)
Glucose, Bld: 168 mg/dL — ABNORMAL HIGH (ref 70–99)
Potassium: 4.3 mEq/L (ref 3.5–5.1)
Sodium: 138 mEq/L (ref 135–145)
Total Bilirubin: 0.5 mg/dL (ref 0.2–1.2)
Total Protein: 6.7 g/dL (ref 6.0–8.3)

## 2019-07-08 ENCOUNTER — Encounter: Payer: Self-pay | Admitting: Endocrinology

## 2019-07-08 ENCOUNTER — Other Ambulatory Visit: Payer: Self-pay

## 2019-07-08 ENCOUNTER — Other Ambulatory Visit: Payer: Self-pay | Admitting: Endocrinology

## 2019-07-08 ENCOUNTER — Ambulatory Visit (INDEPENDENT_AMBULATORY_CARE_PROVIDER_SITE_OTHER): Payer: Medicare Other | Admitting: Endocrinology

## 2019-07-08 VITALS — BP 144/70 | HR 60 | Ht 59.0 in | Wt 95.8 lb

## 2019-07-08 DIAGNOSIS — I10 Essential (primary) hypertension: Secondary | ICD-10-CM

## 2019-07-08 DIAGNOSIS — Z23 Encounter for immunization: Secondary | ICD-10-CM

## 2019-07-08 DIAGNOSIS — N289 Disorder of kidney and ureter, unspecified: Secondary | ICD-10-CM | POA: Diagnosis not present

## 2019-07-08 DIAGNOSIS — Z794 Long term (current) use of insulin: Secondary | ICD-10-CM | POA: Diagnosis not present

## 2019-07-08 DIAGNOSIS — E1165 Type 2 diabetes mellitus with hyperglycemia: Secondary | ICD-10-CM

## 2019-07-08 MED ORDER — AMLODIPINE BESY-BENAZEPRIL HCL 5-20 MG PO CAPS: 1.0000 | ORAL_CAPSULE | Freq: Every day | ORAL | 0 refills | Status: DC

## 2019-07-08 NOTE — Progress Notes (Signed)
Patient ID: Mariah Peterson, female   DOB: Mar 22, 1946, 73 y.o.   MRN: [id number]          Reason for Appointment: Follow-up for Type 2 Diabetes   History of Present Illness:          Date of diagnosis of type 2 diabetes mellitus:?  1990       Background history:   She was started on insulin about 4 years ago and previously taking metformin and glipizide No detailed history is available and she does not think she has taken any other types of diabetes medications or injections  Recent history:    INSULIN regimen [id number] 20 units daily in am, Novolog 5 units bid at lunch and dinner      Non-insulin hypoglycemic drugs the patient is taking are:, Actos 15 mg daily  Current management, blood sugar patterns and problems identified:  Her A1c last was 9% and is now 9.3  She was told to stop metformin when her creatinine had gone up to 1.7 in July  Although she has been specifically told to take NovoLog with breakfast and dinnertime she only takes this at lunch and dinner  She only takes Lantus in the morning  Again despite her instructions she checks her blood sugars mostly in the morning and not consistently later in the day  As a result most of her diet for blood sugar in the early evenings is missing  From available data I appears that her highest blood sugars are after breakfast before noon  Lowest blood sugars are early morning without significant hypoglycemia  He appears to have had blood sugars below normal only early morning on 9/5  She is usually eating a balanced meal with some protein at breakfast but sometimes will eat cereal when her blood sugar may be higher  Avoiding drinks with sugar usually but occasionally may eat a small amount of sweets or snacks with sugar  Weight is not as low  FASTING readings are fairly good and relatively consistent She has no edema with Actos  She was seen by diabetes educator in 9/19       Side effects from medications have been:  None  Compliance with the medical regimen: Fair  Typical meal intake: Breakfast is cereal or oatmeal, otherwise eggs and toast.  Lunch may be rice and beans or a sandwich Fruit for snacks               Exercise:  During the days is active with her grandson, not much formal exercise  Glucose monitoring:        Glucometer: Freestyle libre      CONTINUOUS GLUCOSE MONITORING RECORD INTERPRETATION    Dates of Recording: 9/2 through 9/15  Sensor description: [employer]  Results statistics:   CGM use % of time  54  Average and SD  154, GV 35  Time in range  68       %  % Time Above 180  22  % Time above 250  6  % Time Below target  for    Glycemic patterns summary: Overall blood sugar variability is mostly with postprandial readings and relatively less overnight blood She has a significant peak in blood sugars late morning which is generally consistent Due to lack of data not clear what her blood sugars are doing between about 3 PM and 9 PM  Hyperglycemic episodes are occurring primarily with breakfast and to some degree late evening with some variability  Hypoglycemic episodes occurred  Overnight periods: Blood sugars averaging 150 at midnight and gradually decrease until 6 AM with only some variability early on  Preprandial periods: Blood sugar is excellent around 6 AM generally between 100 and 155  Blood sugars are averaging about 200 Dinnertime blood sugars are not available  Postprandial periods:   After breakfast:   Blood sugars are rising progressively until about 11 AM and then averaging just over 200 After lunch: Blood sugar is declining gradually and down to about 146 average in the early afternoon  After dinner: Information is not available but blood sugars around 10 PM are quite variable although averaging only about 170   PRE-MEAL Fasting Lunch Dinner Bedtime Overall  Glucose range:       Mean/median:  116  191  140   154   POST-MEAL PC Breakfast PC  Lunch PC Dinner  Glucose range:     Mean/median:  201  146  170   Previous data:   PRE-MEAL Fasting Lunch Dinner Bedtime Overall  Glucose range:  94-137   125-159    Mean/median:      150   POST-MEAL PC Breakfast PC Lunch PC Dinner  Glucose range:   150 ?  Mean/median:       PREVIOUS DATA:   Blood Glucose analysis by download of freestyle libre:  CGM use % of time  35  2-week average/SD  135  Time in range    82    %  % Time Above 180  14  % Time above 250 1  % Time Below 70 3     PRE-MEAL Fasting Lunch Dinner Bedtime Overall  Glucose range:       Averages:  115  186    135   POST-MEAL PC Breakfast PC Lunch PC Dinner  Glucose range:     Averages:  197   238    Dietician visit, most recent: None CDE consultation: 07/01/2018  Weight history:  Wt Readings from Last 3 Encounters:  07/08/19 95 lb 12.8 oz (43.5 kg)  05/15/19 92 lb (41.7 kg)  04/30/19 92 lb (41.7 kg)    Glycemic control:   Lab Results  Component Value Date   HGBA1C 9.3 (H) 07/06/2019   HGBA1C 9.0 (H) 02/11/2019   HGBA1C 10.5 (H) 09/17/2018   Lab Results  Component Value Date   MICROALBUR 8.1 (H) 07/10/2018   LDLCALC 67 02/11/2019   CREATININE 1.39 (H) 07/06/2019   Lab Results  Component Value Date   MICRALBCREAT 11.4 07/10/2018    Lab Results  Component Value Date   FRUCTOSAMINE 524 (H) 05/01/2019   FRUCTOSAMINE CANCELED 04/27/2019   FRUCTOSAMINE 369 (H) 11/03/2018    Lab on 07/06/2019  Component Date Value Ref Range Status  . Sodium 07/06/2019 138  135 - 145 mEq/L Final  . Potassium 07/06/2019 4.3  3.5 - 5.1 mEq/L Final  . Chloride 07/06/2019 105  96 - 112 mEq/L Final  . CO2 07/06/2019 26  19 - 32 mEq/L Final  . Glucose, Bld 07/06/2019 168* 70 - 99 mg/dL Final  . BUN 07/06/2019 32* 6 - 23 mg/dL Final  . Creatinine, Ser 07/06/2019 1.39* 0.40 - 1.20 mg/dL Final  . Total Bilirubin 07/06/2019 0.5  0.2 - 1.2 mg/dL Final  . Alkaline Phosphatase 07/06/2019 43  39 - 117 U/L Final   . AST 07/06/2019 18  0 - 37 U/L Final  . ALT 07/06/2019 11  0 - 35 U/L Final  . Total  Protein 07/06/2019 6.7  6.0 - 8.3 g/dL Final  . Albumin 07/06/2019 3.9  3.5 - 5.2 g/dL Final  . Calcium 07/06/2019 9.9  8.4 - 10.5 mg/dL Final  . GFR 07/06/2019 45.06* >60.00 mL/min Final  . Hgb A1c MFr Bld 07/06/2019 9.3* 4.6 - 6.5 % Final   Glycemic Control Guidelines for People with Diabetes:Non Diabetic:  <6%Goal of Therapy: <7%Additional Action Suggested:  >8%     Allergies as of 07/08/2019   No Known Allergies     Medication List       Accurate as of July 08, 2019  3:41 PM. If you have any questions, ask your nurse or doctor.        STOP taking these medications   metFORMIN 500 MG tablet Commonly known as: GLUCOPHAGE Stopped by: Elayne Snare, MD     TAKE these medications   FreeStyle Libre 14 Day Sensor Misc Inject 1 each into the skin every 14 (fourteen) days.   glucose blood test strip Use as instructed to test blood sugar 3 times daily E11.65   Lantus SoloStar 100 UNIT/ML Solostar Pen Generic drug: Insulin Glargine Inject 20 Units into the skin daily. Inject 20 units under the skin once daily. What changed: Another medication with the same name was removed. Continue taking this medication, and follow the directions you see here. Changed by: Elayne Snare, MD   lisinopril 40 MG tablet Commonly known as: ZESTRIL Take 1 tablet (40 mg total) by mouth daily.   NovoLOG FlexPen 100 UNIT/ML FlexPen Generic drug: insulin aspart Inject 6 Units into the skin 3 (three) times daily with meals. Inject 6 units under the skin three times daily before meals. What changed: Another medication with the same name was removed. Continue taking this medication, and follow the directions you see here. Changed by: Elayne Snare, MD   pioglitazone 15 MG tablet Commonly known as: Actos Take 1 tablet (15 mg total) by mouth daily.   rosuvastatin 40 MG tablet Commonly known as: CRESTOR TAKE 1 TABLET  (40 MG TOTAL) BY MOUTH DAILY.   Travoprost (BAK Free) 0.004 % Soln ophthalmic solution Commonly known as: TRAVATAN Place 1 drop into both eyes at bedtime.       Allergies: No Known Allergies  Past Medical History:  Diagnosis Date  . Diabetes mellitus without complication ([place])   . Glaucoma   . History of chicken pox   . Hypertension     Past Surgical History:  Procedure Laterality Date  . BREAST BIOPSY Right 2017   benign  . CATARACT EXTRACTION, BILATERAL    . CESAREAN SECTION    . COLONOSCOPY  1980's  . TUBAL LIGATION      Family History  Problem Relation Age of Onset  . Hypertension Mother   . Diabetes Brother   . CAD Brother   . Hypertension Brother   . Diabetes Brother   . Hypertension Brother   . Colon cancer Neg Hx   . Esophageal cancer Neg Hx   . Pancreatic cancer Neg Hx   . Liver disease Neg Hx   . Stomach cancer Neg Hx   . Rectal cancer Neg Hx   . Colon polyps Neg Hx     Social History:  reports that she has never smoked. She has never used smokeless tobacco. She reports that she does not drink alcohol or use drugs.   Review of Systems   Lipid history: Currently on Crestor 40 mg with the following results   Lab Results  Component Value Date   CHOL 147 02/11/2019   HDL 68.50 02/11/2019   LDLCALC 67 02/11/2019   TRIG 61.0 02/11/2019   CHOLHDL 2 02/11/2019           Hypertension: Has been present for several years, on 40 mg dose  Lisinopril  She does not usually check blood pressure readings at home, does apparently have a meter at home She previously was on chlorthalidone from PCP and not clear when or why this was stopped  Blood pressure does not appear to be as high today    BP Readings from Last 3 Encounters:  07/08/19 (!) 144/70  05/15/19 125/64  03/31/19 140/70   Renal function appears to be better again, not clear why it was worse in 7/20  No history of microalbuminuria  Lab Results  Component Value Date   CREATININE 1.39  (H) 07/06/2019   CREATININE 1.70 (H) 04/27/2019   CREATININE 1.25 (H) 02/11/2019    Most recent eye exam was in 5/19  Most recent foot exam: 9/20  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  LABS:  Lab on 07/06/2019  Component Date Value Ref Range Status  . Sodium 07/06/2019 138  135 - 145 mEq/L Final  . Potassium 07/06/2019 4.3  3.5 - 5.1 mEq/L Final  . Chloride 07/06/2019 105  96 - 112 mEq/L Final  . CO2 07/06/2019 26  19 - 32 mEq/L Final  . Glucose, Bld 07/06/2019 168* 70 - 99 mg/dL Final  . BUN 07/06/2019 32* 6 - 23 mg/dL Final  . Creatinine, Ser 07/06/2019 1.39* 0.40 - 1.20 mg/dL Final  . Total Bilirubin 07/06/2019 0.5  0.2 - 1.2 mg/dL Final  . Alkaline Phosphatase 07/06/2019 43  39 - 117 U/L Final  . AST 07/06/2019 18  0 - 37 U/L Final  . ALT 07/06/2019 11  0 - 35 U/L Final  . Total Protein 07/06/2019 6.7  6.0 - 8.3 g/dL Final  . Albumin 07/06/2019 3.9  3.5 - 5.2 g/dL Final  . Calcium 07/06/2019 9.9  8.4 - 10.5 mg/dL Final  . GFR 07/06/2019 45.06* >60.00 mL/min Final  . Hgb A1c MFr Bld 07/06/2019 9.3* 4.6 - 6.5 % Final   Glycemic Control Guidelines for People with Diabetes:Non Diabetic:  <6%Goal of Therapy: <7%Additional Action Suggested:  >8%     Physical Examination:  BP (!) 144/70 (BP Location: Left Arm, Patient Position: Sitting, Cuff Size: Normal)   Pulse 60   Ht 4\' 11"  (1.499 m)   Wt 95 lb 12.8 oz (43.5 kg)   SpO2 99%   BMI 19.35 kg/m   Diabetic Foot Exam - Simple   No data filed           ASSESSMENT:  Diabetes type 2, insulin-dependent    A1c is now 9.3 and consistently high Previously fructosamine 524 also indicating hyperglycemia  See history of present illness for discussion of current diabetes management, blood sugar patterns and problems identified  Hyperglycemia is mostly postprandial and generally related to readings averaging well over 200 after breakfast from lack of mealtime doses in the morning She does not  understand the differences between the mealtime dose and the Lantus and is only taking Lantus in the morning at breakfast Blood sugars are not assessed adequately after dinner as he does not monitor much Also it may be possible that she is not taking her suppertime dose before the meal consistently  No hypoglycemia except once overnight and this may have been falsely low Her  weight is slightly better  HYPERTENSION: Her blood pressure is mildly high Since she may have a tendency to higher creatinine with 40 mg lisinopril will benefit from lower doses along with adding amlodipine  Renal dysfunction: Her creatinine is better Needs urine microalbumin checked  She also needs to follow-up with PCP for general care  Lipids: Needs follow-up to make sure she is consistent with her Crestor  PLAN:   Written instructions given for what she needs. She will start checking her blood glucose with her freestyle libre 3 times a day at least including in the evenings Discussed needing to take both her insulin injections in the morning at breakfast If she has consistently high readings postprandially will need to adjust her dose of NovoLog in the next visit NOVOLOG to be taken 5 units before breakfast also  Balanced meals with avoiding high carbohydrate and high fat meals  Will not restart metformin as yet as she does not appear to have much insulin resistance and fasting readings are good  She needs to follow-up with her PCP regularly  We will start her on Lotrel 5/20 instead of lisinopril 40 mg  High-dose influenza vaccine given  Patient Instructions  Check blood sugars on waking up 4-5 days a week Check daily at lunch and supper and bedtime  Also check blood sugars about 2 hours after meals and do this after different meals by rotation  Recommended blood sugar levels on waking up are 90-130 and about 2 hours after meal is 130-160  Please bring your blood sugar monitor to each visit, thank  you  MUST TAKE BLUE PEN 5 UNITS BEFORE BREAKFAST ALSO  Will be changing BP pill      Elayne Snare 07/08/2019, 3:41 PM   Note: This office note was prepared with Dragon voice recognition system technology. Any transcriptional errors that result from this process are unintentional.

## 2019-07-08 NOTE — Patient Instructions (Addendum)
Check blood sugars on waking up 4-5 days a week Check daily at lunch and supper and bedtime  Also check blood sugars about 2 hours after meals and do this after different meals by rotation  Recommended blood sugar levels on waking up are 90-130 and about 2 hours after meal is 130-160  Please bring your blood sugar monitor to each visit, thank you  MUST TAKE BLUE PEN 5 UNITS BEFORE BREAKFAST ALSO  Will be changing BP pill

## 2019-07-17 ENCOUNTER — Other Ambulatory Visit: Payer: Self-pay

## 2019-07-17 MED ORDER — PIOGLITAZONE HCL 15 MG PO TABS: 15.0000 mg | ORAL_TABLET | Freq: Every day | ORAL | 1 refills | Status: DC

## 2019-09-04 ENCOUNTER — Other Ambulatory Visit: Payer: Self-pay | Admitting: Endocrinology

## 2019-09-11 ENCOUNTER — Other Ambulatory Visit: Payer: Self-pay | Admitting: Endocrinology

## 2019-09-21 ENCOUNTER — Other Ambulatory Visit: Payer: Medicare Other

## 2019-09-21 ENCOUNTER — Other Ambulatory Visit (INDEPENDENT_AMBULATORY_CARE_PROVIDER_SITE_OTHER): Payer: Medicare Other

## 2019-09-21 ENCOUNTER — Other Ambulatory Visit: Payer: Self-pay

## 2019-09-21 DIAGNOSIS — E1165 Type 2 diabetes mellitus with hyperglycemia: Secondary | ICD-10-CM

## 2019-09-21 DIAGNOSIS — Z794 Long term (current) use of insulin: Secondary | ICD-10-CM

## 2019-09-22 LAB — MICROALBUMIN / CREATININE URINE RATIO
Creatinine,U: 86.8 mg/dL
Microalb Creat Ratio: 14.2 mg/g (ref 0.0–30.0)
Microalb, Ur: 12.3 mg/dL — ABNORMAL HIGH (ref 0.0–1.9)

## 2019-09-22 LAB — COMPREHENSIVE METABOLIC PANEL
ALT: 11 U/L (ref 0–35)
AST: 20 U/L (ref 0–37)
Albumin: 4.3 g/dL (ref 3.5–5.2)
Alkaline Phosphatase: 36 U/L — ABNORMAL LOW (ref 39–117)
BUN: 31 mg/dL — ABNORMAL HIGH (ref 6–23)
CO2: 26 mEq/L (ref 19–32)
Calcium: 10.9 mg/dL — ABNORMAL HIGH (ref 8.4–10.5)
Chloride: 104 mEq/L (ref 96–112)
Creatinine, Ser: 1.4 mg/dL — ABNORMAL HIGH (ref 0.40–1.20)
GFR: 44.66 mL/min — ABNORMAL LOW (ref >60.00)
Glucose, Bld: 65 mg/dL — ABNORMAL LOW (ref 70–99)
Potassium: 3.9 mEq/L (ref 3.5–5.1)
Sodium: 139 mEq/L (ref 135–145)
Total Bilirubin: 0.5 mg/dL (ref 0.2–1.2)
Total Protein: 7.2 g/dL (ref 6.0–8.3)

## 2019-09-22 LAB — LIPID PANEL
Cholesterol: 136 mg/dL (ref 0–200)
HDL: 58 mg/dL (ref >39.00)
LDL Cholesterol: 60 mg/dL (ref 0–99)
NonHDL: 78.14
Total CHOL/HDL Ratio: 2
Triglycerides: 93 mg/dL (ref 0.0–149.0)
VLDL: 18.6 mg/dL (ref 0.0–40.0)

## 2019-09-22 LAB — HEMOGLOBIN A1C: Hgb A1c MFr Bld: 8.8 % — ABNORMAL HIGH (ref 4.6–6.5)

## 2019-09-23 ENCOUNTER — Encounter: Payer: Self-pay | Admitting: Endocrinology

## 2019-09-23 ENCOUNTER — Other Ambulatory Visit: Payer: Self-pay

## 2019-09-23 ENCOUNTER — Ambulatory Visit (INDEPENDENT_AMBULATORY_CARE_PROVIDER_SITE_OTHER): Payer: Medicare Other | Admitting: Endocrinology

## 2019-09-23 VITALS — BP 142/64 | HR 108 | Ht 59.0 in | Wt 96.2 lb

## 2019-09-23 DIAGNOSIS — E7800 Pure hypercholesterolemia, unspecified: Secondary | ICD-10-CM | POA: Diagnosis not present

## 2019-09-23 DIAGNOSIS — N183 Chronic kidney disease, stage 3 unspecified: Secondary | ICD-10-CM

## 2019-09-23 DIAGNOSIS — I10 Essential (primary) hypertension: Secondary | ICD-10-CM

## 2019-09-23 DIAGNOSIS — E1165 Type 2 diabetes mellitus with hyperglycemia: Secondary | ICD-10-CM | POA: Diagnosis not present

## 2019-09-23 DIAGNOSIS — Z794 Long term (current) use of insulin: Secondary | ICD-10-CM | POA: Diagnosis not present

## 2019-09-23 NOTE — Patient Instructions (Addendum)
Take 18 Lantus  If eating bread in am take 5 Novolog   Have a protein in am with bread  Check blood sugars on waking up 7 days a week  Also check blood sugars about 2 hours after meals and do this after different meals by rotation  Recommended blood sugar levels on waking up are 90-130 and about 2 hours after meal is 130-180

## 2019-09-23 NOTE — Progress Notes (Signed)
Patient ID: Mariah Peterson, female   DOB: 1945-12-31, 73 y.o.   MRN: [id number]          Reason for Appointment: Follow-up for Type 2 Diabetes   History of Present Illness:          Date of diagnosis of type 2 diabetes mellitus:?  1990       Background history:   She was started on insulin about 4 years ago and previously taking metformin and glipizide No detailed history is available and she does not think she has taken any other types of diabetes medications or injections  Recent history:    INSULIN regimen [id number] 20 units daily in am, Novolog 5 units bid at lunch and dinner      Non-insulin hypoglycemic drugs the patient is taking are:, Actos 15 mg daily  Current management, blood sugar patterns and problems identified: Her A1c  has improved to 8.8, previously 9% normal   She is still not checking her blood sugars much especially in the evenings despite reminders and diet is only available for 59% of the time in the last 2 weeks  Only occasionally will have relatively low sugars during the night but these are asymptomatic  Also not clear how accurate her freestyle libre compared to fingersticks since her lab glucose was done on the day when she did not have home readings available  She also did not have any symptoms when her sugar was 65 in the lab in the afternoon  Fasting blood sugars appear to be excellent although blood sugars are rising after about 6 AM on her sensor review  HYPERGLYCEMIA appears to be occurring before noon and early afternoon from her CGM but not clear if this is related to her breakfast or lunch meals are both  Still has significant variability in blood sugars also  She will sometimes eat a toast in the morning without any insulin coverage but morning meals are sometimes skipped and she will only have a late morning meal and dinner  She does not think she has been forgetting her NovoLog at lunch and dinner  Blood sugars are the lowest between  4-6 AM and then gradually increasing She has no edema with Actos  She was seen by diabetes educator in 9/19       Side effects from medications have been: None  Compliance with the medical regimen: Fair  Typical meal intake: Breakfast is cereal or oatmeal, otherwise eggs and toast.  Lunch may be rice and beans or a sandwich Fruit for snacks               Exercise:  During the days is active with her grandson, not much formal exercise  Glucose monitoring:        Glucometer: Freestyle libre     CGM use % of time  59  2-week average/SD  137 GV 41  Time in range      76%  % Time Above 180  10  % Time above 250  6  % Time Below 70  8     PRE-MEAL Fasting Lunch Dinner Bedtime Overall  Glucose range:       Averages:  106  189  134  153    POST-MEAL PC Breakfast PC Lunch PC Dinner  Glucose range:     Averages: ?  189  201      Dietician visit, most recent: None CDE consultation: 07/01/2018  Weight history:  Wt Readings from Last 3  Encounters:  09/23/19 96 lb 3.2 oz (43.6 kg)  07/08/19 95 lb 12.8 oz (43.5 kg)  05/15/19 92 lb (41.7 kg)    Glycemic control:   Lab Results  Component Value Date   HGBA1C 8.8 (H) 09/21/2019   HGBA1C 9.3 (H) 07/06/2019   HGBA1C 9.0 (H) 02/11/2019   Lab Results  Component Value Date   MICROALBUR 12.3 (H) 09/21/2019   LDLCALC 60 09/21/2019   CREATININE 1.40 (H) 09/21/2019   Lab Results  Component Value Date   MICRALBCREAT 14.2 09/21/2019    Lab Results  Component Value Date   FRUCTOSAMINE 524 (H) 05/01/2019   FRUCTOSAMINE CANCELED 04/27/2019   FRUCTOSAMINE 369 (H) 11/03/2018    Lab on 09/21/2019  Component Date Value Ref Range Status   Microalb, Ur 09/21/2019 12.3* 0.0 - 1.9 mg/dL Final   Creatinine,U 09/21/2019 86.8  mg/dL Final   Microalb Creat Ratio 09/21/2019 14.2  0.0 - 30.0 mg/g Final   Cholesterol 09/21/2019 136  0 - 200 mg/dL Final   ATP III Classification       Desirable:  < 200 mg/dL               Borderline  High:  200 - 239 mg/dL          High:  > = 240 mg/dL   Triglycerides 09/21/2019 93.0  0.0 - 149.0 mg/dL Final   Normal:  <150 mg/dLBorderline High:  150 - 199 mg/dL   HDL 09/21/2019 58.00  >39.00 mg/dL Final   VLDL 09/21/2019 18.6  0.0 - 40.0 mg/dL Final   LDL Cholesterol 09/21/2019 60  0 - 99 mg/dL Final   Total CHOL/HDL Ratio 09/21/2019 2   Final                  Men          Women1/2 Average Risk     3.4          3.3Average Risk          5.0          4.42X Average Risk          9.6          7.13X Average Risk          15.0          11.0                       NonHDL 09/21/2019 78.14   Final   NOTE:  Non-HDL goal should be 30 mg/dL higher than patient's LDL goal (i.e. LDL goal of < 70 mg/dL, would have non-HDL goal of < 100 mg/dL)   Sodium 09/21/2019 139  135 - 145 mEq/L Final   Potassium 09/21/2019 3.9  3.5 - 5.1 mEq/L Final   Chloride 09/21/2019 104  96 - 112 mEq/L Final   CO2 09/21/2019 26  19 - 32 mEq/L Final   Glucose, Bld 09/21/2019 65* 70 - 99 mg/dL Final   BUN 09/21/2019 31* 6 - 23 mg/dL Final   Creatinine, Ser 09/21/2019 1.40* 0.40 - 1.20 mg/dL Final   Total Bilirubin 09/21/2019 0.5  0.2 - 1.2 mg/dL Final   Alkaline Phosphatase 09/21/2019 36* 39 - 117 U/L Final   AST 09/21/2019 20  0 - 37 U/L Final   ALT 09/21/2019 11  0 - 35 U/L Final   Total Protein 09/21/2019 7.2  6.0 - 8.3 g/dL Final   Albumin 09/21/2019 4.3  3.5 -  5.2 g/dL Final   GFR 09/21/2019 44.66* >60.00 mL/min Final   Calcium 09/21/2019 10.9* 8.4 - 10.5 mg/dL Final   Hgb A1c MFr Bld 09/21/2019 8.8* 4.6 - 6.5 % Final   Glycemic Control Guidelines for People with Diabetes:Non Diabetic:  <6%Goal of Therapy: <7%Additional Action Suggested:  >8%     Allergies as of 09/23/2019   No Known Allergies     Medication List       Accurate as of September 23, 2019  9:41 PM. If you have any questions, ask your nurse or doctor.        STOP taking these medications   lisinopril 40 MG tablet Commonly  known as: ZESTRIL Stopped by: Elayne Snare, MD     TAKE these medications   amLODipine-benazepril 5-20 MG capsule Commonly known as: LOTREL TAKE 1 CAPSULE BY MOUTH DAILY. THIS REPLACES LISINOPRIL   FreeStyle Libre 14 Day Sensor Misc Inject 1 each into the skin every 14 (fourteen) days.   glucose blood test strip Use as instructed to test blood sugar 3 times daily E11.65   Lantus SoloStar 100 UNIT/ML Solostar Pen Generic drug: Insulin Glargine Inject 20 Units into the skin daily. Inject 20 units under the skin once daily.   NovoLOG FlexPen 100 UNIT/ML FlexPen Generic drug: insulin aspart Inject 6 Units into the skin 3 (three) times daily with meals. Inject 6 units under the skin three times daily before meals.   pioglitazone 15 MG tablet Commonly known as: Actos Take 1 tablet (15 mg total) by mouth daily.   rosuvastatin 40 MG tablet Commonly known as: CRESTOR TAKE 1 TABLET EVERY DAY   Travoprost (BAK Free) 0.004 % Soln ophthalmic solution Commonly known as: TRAVATAN Place 1 drop into both eyes at bedtime.       Allergies: No Known Allergies  Past Medical History:  Diagnosis Date   Diabetes mellitus without complication (HCC)    Glaucoma    History of chicken pox    Hypertension     Past Surgical History:  Procedure Laterality Date   BREAST BIOPSY Right 2017   benign   CATARACT EXTRACTION, BILATERAL     CESAREAN SECTION     COLONOSCOPY  1980's   TUBAL LIGATION      Family History  Problem Relation Age of Onset   Hypertension Mother    Diabetes Brother    CAD Brother    Hypertension Brother    Diabetes Brother    Hypertension Brother    Colon cancer Neg Hx    Esophageal cancer Neg Hx    Pancreatic cancer Neg Hx    Liver disease Neg Hx    Stomach cancer Neg Hx    Rectal cancer Neg Hx    Colon polyps Neg Hx     Social History:  reports that she has never smoked. She has never used smokeless tobacco. She reports that she does  not drink alcohol or use drugs.   Review of Systems   Lipid history: Currently on Crestor 40 mg with the following results   Lab Results  Component Value Date   CHOL 136 09/21/2019   HDL 58.00 09/21/2019   LDLCALC 60 09/21/2019   TRIG 93.0 09/21/2019   CHOLHDL 2 09/21/2019           Hypertension: Has been present for several years, on Lotrel now  She does sometimes check her blood pressure at home but does not remember her readings She thinks her blood pressure is lower at  home than in the office   BP Readings from Last 3 Encounters:  09/23/19 (!) 142/64  07/08/19 (!) 144/70  05/15/19 125/64   Renal function appears to be better again, not clear why it was worse in 7/20  No history of microalbuminuria  Lab Results  Component Value Date   CREATININE 1.40 (H) 09/21/2019   CREATININE 1.39 (H) 07/06/2019   CREATININE 1.70 (H) 04/27/2019    Most recent eye exam was in 5/19  Most recent foot exam: 9/20  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  LABS:  Lab on 09/21/2019  Component Date Value Ref Range Status   Microalb, Ur 09/21/2019 12.3* 0.0 - 1.9 mg/dL Final   Creatinine,U 09/21/2019 86.8  mg/dL Final   Microalb Creat Ratio 09/21/2019 14.2  0.0 - 30.0 mg/g Final   Cholesterol 09/21/2019 136  0 - 200 mg/dL Final   ATP III Classification       Desirable:  < 200 mg/dL               Borderline High:  200 - 239 mg/dL          High:  > = 240 mg/dL   Triglycerides 09/21/2019 93.0  0.0 - 149.0 mg/dL Final   Normal:  <150 mg/dLBorderline High:  150 - 199 mg/dL   HDL 09/21/2019 58.00  >39.00 mg/dL Final   VLDL 09/21/2019 18.6  0.0 - 40.0 mg/dL Final   LDL Cholesterol 09/21/2019 60  0 - 99 mg/dL Final   Total CHOL/HDL Ratio 09/21/2019 2   Final                  Men          Women1/2 Average Risk     3.4          3.3Average Risk          5.0          4.42X Average Risk          9.6          7.13X Average Risk          15.0          11.0                        NonHDL 09/21/2019 78.14   Final   NOTE:  Non-HDL goal should be 30 mg/dL higher than patient's LDL goal (i.e. LDL goal of < 70 mg/dL, would have non-HDL goal of < 100 mg/dL)   Sodium 09/21/2019 139  135 - 145 mEq/L Final   Potassium 09/21/2019 3.9  3.5 - 5.1 mEq/L Final   Chloride 09/21/2019 104  96 - 112 mEq/L Final   CO2 09/21/2019 26  19 - 32 mEq/L Final   Glucose, Bld 09/21/2019 65* 70 - 99 mg/dL Final   BUN 09/21/2019 31* 6 - 23 mg/dL Final   Creatinine, Ser 09/21/2019 1.40* 0.40 - 1.20 mg/dL Final   Total Bilirubin 09/21/2019 0.5  0.2 - 1.2 mg/dL Final   Alkaline Phosphatase 09/21/2019 36* 39 - 117 U/L Final   AST 09/21/2019 20  0 - 37 U/L Final   ALT 09/21/2019 11  0 - 35 U/L Final   Total Protein 09/21/2019 7.2  6.0 - 8.3 g/dL Final   Albumin 09/21/2019 4.3  3.5 - 5.2 g/dL Final   GFR 09/21/2019 44.66* >60.00 mL/min Final   Calcium 09/21/2019 10.9* 8.4 - 10.5 mg/dL Final  Hgb A1c MFr Bld 09/21/2019 8.8* 4.6 - 6.5 % Final   Glycemic Control Guidelines for People with Diabetes:Non Diabetic:  <6%Goal of Therapy: <7%Additional Action Suggested:  >8%     Physical Examination:  BP (!) 142/64    Pulse (!) 108    Ht 4\' 11"  (1.499 m)    Wt 96 lb 3.2 oz (43.6 kg)    SpO2 96%    BMI 19.43 kg/m        ASSESSMENT:  Diabetes type 2, insulin-dependent    A1c is now improving at 8.8  However blood sugars at home are not as high as expected for the A1c  See history of present illness for discussion of current diabetes management, blood sugar patterns and problems identified  She is using the freestyle libre system but not monitoring blood sugars throughout the day to give a complete picture Most of the time forgets to check readings in the evening before and after supper Also not clear if her freestyle Elenor Legato is sometimes reading falsely low although hypoglycemia has been minimal and only occasionally during the night without symptoms As before has  postprandial blood sugar spikes periodically after breakfast or lunch depending on her diet and compliance with her insulin at mealtimes  HYPERTENSION: Her blood pressure is slightly high but she thinks she has whitecoat syndrome Appears better with Lotrel compared to lisinopril She checks blood pressures at home but does not remember her readings  Renal dysfunction: Her creatinine is improved Microalbumin ratio normal  lipids: Well-controlled with LDL 60   PLAN:   She will need to make sure she takes NovoLog if she has any carbohydrate in the morning at breakfast including toast Have protein with every meal and explained the importance of doing so Although she needs larger doses of NovoLog when she is eating more carbohydrates she is likely is not able to understand how to adjust her dosage She will continue 5 units NovoLog with every meal Make sure she takes the insulin consistently at lunch and dinner Reduce Lantus to 18 because of tendency to overnight hypoglycemia on her sensor  She needs to follow-up with her PCP soon Recommend that she keep a record of her blood pressure readings today for her PCP to see  Continue Lotrel for hypertension but may consider adding HCTZ if blood pressure consistently high  Patient Instructions  Take 18 Lantus  If eating bread in am take 5 Novolog   Have a protein in am with bread  Check blood sugars on waking up 7 days a week  Also check blood sugars about 2 hours after meals and do this after different meals by rotation  Recommended blood sugar levels on waking up are 90-130 and about 2 hours after meal is 130-180      Total visit time for evaluation and management of multiple problems and counseling =25 minutes     Elayne Snare 09/23/2019, 9:41 PM   Note: This office note was prepared with Dragon voice recognition system technology. Any transcriptional errors that result from this process are unintentional.

## 2019-09-30 ENCOUNTER — Other Ambulatory Visit: Payer: Self-pay

## 2019-09-30 ENCOUNTER — Encounter: Payer: Self-pay | Admitting: Family

## 2019-09-30 ENCOUNTER — Ambulatory Visit (INDEPENDENT_AMBULATORY_CARE_PROVIDER_SITE_OTHER): Payer: Medicare Other | Admitting: *Deleted

## 2019-09-30 ENCOUNTER — Ambulatory Visit: Payer: Medicare Other | Admitting: Family

## 2019-09-30 ENCOUNTER — Telehealth: Payer: Self-pay

## 2019-09-30 VITALS — BP 122/62 | HR 71 | Wt 96.0 lb

## 2019-09-30 VITALS — BP 122/62 | HR 71 | Temp 98.6°F | Resp 17 | Ht 59.0 in | Wt 96.0 lb

## 2019-09-30 DIAGNOSIS — E119 Type 2 diabetes mellitus without complications: Secondary | ICD-10-CM

## 2019-09-30 DIAGNOSIS — E1165 Type 2 diabetes mellitus with hyperglycemia: Secondary | ICD-10-CM | POA: Diagnosis not present

## 2019-09-30 DIAGNOSIS — Z794 Long term (current) use of insulin: Secondary | ICD-10-CM

## 2019-09-30 DIAGNOSIS — I10 Essential (primary) hypertension: Secondary | ICD-10-CM

## 2019-09-30 DIAGNOSIS — Z23 Encounter for immunization: Secondary | ICD-10-CM | POA: Diagnosis not present

## 2019-09-30 DIAGNOSIS — Z0000 Encounter for general adult medical examination without abnormal findings: Secondary | ICD-10-CM

## 2019-09-30 NOTE — Progress Notes (Signed)
Mariah Peterson is a 73 y.o. female with the following history as recorded in [employer]:  Patient Active Problem List   Diagnosis Date Noted  . Glaucoma 03/31/2019  . Healthcare maintenance 12/03/2018  . Anemia 12/03/2018  . Hypertension 10/31/2018  . Hyperlipidemia 08/04/2013  . Diabetes mellitus ([place]) 08/04/2013    Current Outpatient Medications  Medication Sig Dispense Refill  . amLODipine-benazepril (LOTREL) 5-20 MG capsule TAKE 1 CAPSULE BY MOUTH DAILY. THIS REPLACES LISINOPRIL 90 capsule 0  . calcium carbonate (OSCAL) 1500 (600 Ca) MG TABS tablet Take 1 tablet by mouth 2 (two) times daily with a meal.    . Continuous Blood Gluc Sensor (FREESTYLE LIBRE 14 DAY SENSOR) MISC Inject 1 each into the skin every 14 (fourteen) days. 2 each 5  . glucose blood test strip Use as instructed to test blood sugar 3 times daily E11.65 270 each 1  . insulin aspart (NOVOLOG FLEXPEN) 100 UNIT/ML FlexPen Inject 6 Units into the skin 3 (three) times daily with meals. Inject 6 units under the skin three times daily before meals.    . Insulin Glargine (LANTUS SOLOSTAR) 100 UNIT/ML Solostar Pen Inject 20 Units into the skin daily. Inject 20 units under the skin once daily.    . pioglitazone (ACTOS) 15 MG tablet Take 1 tablet (15 mg total) by mouth daily. 90 tablet 1  . rosuvastatin (CRESTOR) 40 MG tablet TAKE 1 TABLET EVERY DAY 90 tablet 1  . Travoprost, BAK Free, (TRAVATAN) 0.004 % SOLN ophthalmic solution Place 1 drop into both eyes at bedtime. 2.5 mL 1   No current facility-administered medications for this visit.     Allergies: Patient has no known allergies.  Past Medical History:  Diagnosis Date  . Diabetes mellitus without complication ([place])   . Glaucoma   . History of chicken pox   . Hypertension     Past Surgical History:  Procedure Laterality Date  . BREAST BIOPSY Right 2017   benign  . CATARACT EXTRACTION, BILATERAL Bilateral   . CESAREAN SECTION    . COLONOSCOPY  1980's  . TUBAL  LIGATION      Family History  Problem Relation Age of Onset  . Hypertension Mother   . Diabetes Brother   . CAD Brother   . Hypertension Brother   . Diabetes Brother   . Hypertension Brother   . Colon cancer Neg Hx   . Esophageal cancer Neg Hx   . Pancreatic cancer Neg Hx   . Liver disease Neg Hx   . Stomach cancer Neg Hx   . Rectal cancer Neg Hx   . Colon polyps Neg Hx     Social History   Tobacco Use  . Smoking status: Never Smoker  . Smokeless tobacco: Never Used  Substance Use Topics  . Alcohol use: No    Subjective:    6 month follow-up on hypertension- at OV last week, her endocrinologist changes her to Lotrel; tolerating well; continuing to work with endocrine for Type 2 Diabetes- hgba1c had improved to 8.8- still working to gain control;  Since last OV, has had colonoscopy and DEXA and mammogram;  Had AWV earlier this morning; otherwise in baseline state of health with no concerns;    Objective:  Vitals:   09/30/19 1127  BP: 122/62  Pulse: 71  Weight: 96 lb (43.5 kg)    General: Well developed, well nourished, in no acute distress  Skin : Warm and dry.  Head: Normocephalic and atraumatic  Lungs: Respirations  unlabored; clear to auscultation bilaterally without wheeze, rales, rhonchi  CVS exam: normal rate, regular rhythm, normal S1, S2, no murmurs, rubs, clicks or gallops.  Neurologic: Alert and oriented; speech intact; face symmetrical; moves all extremities well; CNII-XII intact without focal deficit   Assessment:  1. Essential hypertension   2. Uncontrolled type 2 diabetes mellitus with hyperglycemia, with long-term current use of insulin ([place])     Plan:  1. Stable; stay on same medication; follow-up in 1 year, sooner prn. 2. Continue working with endocrine with goal of Hgba1c below 8;  This visit occurred during the SARS-CoV-2 public health emergency.  Safety protocols were in place, including screening questions prior to the visit, additional usage  of staff PPE, and extensive cleaning of exam room while observing appropriate contact time as indicated for disinfecting solutions.     Follow up in 1 year since her endocrinologist is checking labs and willing to adjust her blood pressure medications.   Return in about 1 year (around 09/29/2020).  No orders of the defined types were placed in this encounter.   Requested Prescriptions    No prescriptions requested or ordered in this encounter

## 2019-09-30 NOTE — Progress Notes (Addendum)
Subjective:   Mariah Peterson is a 73 y.o. female who presents for an Initial Medicare Annual Wellness Visit.  This visit occurred during the SARS-CoV-2 public health emergency.  Safety protocols were in place, including screening questions prior to the visit, additional usage of staff PPE, and extensive cleaning of exam room while observing appropriate contact time as indicated for disinfecting solutions.   Review of Systems     Cardiac Risk Factors include: advanced age (>10men, >57 women);diabetes mellitus;hypertension Sleep patterns: sleeps 6 hours nightly. Patient reports insomnia issues, discussed recommended sleep tips.    Home Safety/Smoke Alarms: Feels safe in home. Smoke alarms in place.  Living environment; residence and Firearm Safety: 1-story house/ trailer. Lives with husband, no needs for DME, good support system Seat Belt Safety/Bike Helmet: Wears seat belt.     Objective:    Today's Vitals   09/30/19 1024  BP: 122/62  Pulse: 71  Resp: 17  Temp: 98.6 F (37 C)  SpO2: 100%  Weight: 96 lb (43.5 kg)  Height: 4\' 11"  (1.499 m)   Body mass index is 19.39 kg/m.  Advanced Directives 09/30/2019 05/15/2019 09/30/2018 06/26/2018  Does Patient Have a Medical Advance Directive? No No Yes Yes  Would patient like information on creating a medical advance directive? Yes (ED - Information included in AVS) - - -    Current Medications (verified) Outpatient Encounter Medications as of 09/30/2019  Medication Sig  . amLODipine-benazepril (LOTREL) 5-20 MG capsule TAKE 1 CAPSULE BY MOUTH DAILY. THIS REPLACES LISINOPRIL  . calcium carbonate (OSCAL) 1500 (600 Ca) MG TABS tablet Take 1 tablet by mouth 2 (two) times daily with a meal.  . Continuous Blood Gluc Sensor (FREESTYLE LIBRE 14 DAY SENSOR) MISC Inject 1 each into the skin every 14 (fourteen) days.  Marland Kitchen glucose blood test strip Use as instructed to test blood sugar 3 times daily E11.65  . insulin aspart (NOVOLOG FLEXPEN) 100 UNIT/ML  FlexPen Inject 6 Units into the skin 3 (three) times daily with meals. Inject 6 units under the skin three times daily before meals.  . Insulin Glargine (LANTUS SOLOSTAR) 100 UNIT/ML Solostar Pen Inject 20 Units into the skin daily. Inject 20 units under the skin once daily.  . pioglitazone (ACTOS) 15 MG tablet Take 1 tablet (15 mg total) by mouth daily.  . rosuvastatin (CRESTOR) 40 MG tablet TAKE 1 TABLET EVERY DAY  . Travoprost, BAK Free, (TRAVATAN) 0.004 % SOLN ophthalmic solution Place 1 drop into both eyes at bedtime.   No facility-administered encounter medications on file as of 09/30/2019.     Allergies (verified) Patient has no known allergies.   History: Past Medical History:  Diagnosis Date  . Diabetes mellitus without complication ([place])   . Glaucoma   . History of chicken pox   . Hypertension    Past Surgical History:  Procedure Laterality Date  . BREAST BIOPSY Right 2017   benign  . CATARACT EXTRACTION, BILATERAL Bilateral   . CESAREAN SECTION    . COLONOSCOPY  1980's  . TUBAL LIGATION     Family History  Problem Relation Age of Onset  . Hypertension Mother   . Diabetes Brother   . CAD Brother   . Hypertension Brother   . Diabetes Brother   . Hypertension Brother   . Colon cancer Neg Hx   . Esophageal cancer Neg Hx   . Pancreatic cancer Neg Hx   . Liver disease Neg Hx   . Stomach cancer Neg Hx   .  Rectal cancer Neg Hx   . Colon polyps Neg Hx    Social History   Socioeconomic History  . Marital status: Married    Spouse name: Not on file  . Number of children: 1  . Years of education: Not on file  . Highest education level: Not on file  Occupational History  . Occupation: Retired  Scientific laboratory technician  . Financial resource strain: Not hard at all  . Food insecurity    Worry: Never true    Inability: Never true  . Transportation needs    Medical: No    Non-medical: No  Tobacco Use  . Smoking status: Never Smoker  . Smokeless tobacco: Never Used   Substance and Sexual Activity  . Alcohol use: No  . Drug use: No  . Sexual activity: Yes  Lifestyle  . Physical activity    Days per week: 3 days    Minutes per session: 30 min  . Stress: Not at all  Relationships  . Social connections    Talks on phone: More than three times a week    Gets together: More than three times a week    Attends religious service: More than 4 times per year    Active member of club or organization: Yes    Attends meetings of clubs or organizations: More than 4 times per year    Relationship status: Married  Other Topics Concern  . Not on file  Social History Narrative  . Not on file    Tobacco Counseling Counseling given: Not Answered  Activities of Daily Living In your present state of health, do you have any difficulty performing the following activities: 09/30/2019  Hearing? N  Vision? N  Difficulty concentrating or making decisions? N  Walking or climbing stairs? N  Dressing or bathing? N  Doing errands, shopping? N  Preparing Food and eating ? N  Using the Toilet? N  In the past six months, have you accidently leaked urine? N  Do you have problems with loss of bowel control? N  Managing your Medications? N  Managing your Finances? N  Housekeeping or managing your Housekeeping? N  Some recent data might be hidden     Immunizations and Health Maintenance Immunization History  Administered Date(s) Administered  . Fluad Quad(high Dose 65+) 07/08/2019  . Influenza-Unspecified 07/21/2018  . Pneumococcal Conjugate-13 01/28/2018  . Pneumococcal Polysaccharide-23 09/30/2019  . Tdap 12/03/2018   Health Maintenance Due  Topic Date Due  . PNA vac Low Risk Adult (2 of 2 - PPSV23) 01/29/2019  . FOOT EXAM  06/19/2019  . OPHTHALMOLOGY EXAM  09/30/2019    Patient Care Team: Marrian Salvage, FNP as PCP - General (Internal Medicine) Elayne Snare, MD as Consulting Physician (Endocrinology) Shirley Muscat Loreen Freud, MD as Referring  Physician (Optometry) Danis, Kirke Corin, MD as Consulting Physician (Gastroenterology)  Indicate any recent Medical Services you may have received from other than [facility] providers in the past year (date may be approximate).     Assessment:   This is a routine wellness examination for Mariah Peterson. Physical assessment deferred to PCP.  Hearing/Vision screen  Hearing Screening   125Hz  250Hz  500Hz  1000Hz  2000Hz  3000Hz  4000Hz  6000Hz  8000Hz   Right ear:           Left ear:           Comments: Able to hear conversational tones w/o difficulty. No issues reported.    Vision Screening Comments: appointment every 6 months Dr. Encarnacion Slates  Dietary  issues and exercise activities discussed: Current Exercise Habits: Home exercise routine, Type of exercise: walking(plays with grandson often), Time (Minutes): 35, Frequency (Times/Week): 4, Weekly Exercise (Minutes/Week): 140, Intensity: Mild, Exercise limited by: None identified  Diet (meal preparation, eat out, water intake, caffeinated beverages, dairy products, fruits and vegetables): in general, a "healthy" diet    Reports poor appetite at times.  Reviewed heart healthy and diabetic diet.  Discussed supplementing with Ensure, samples and coupons provided. Encouraged patient to increase daily water and healthy fluid intake. Diet education was attached to patient's AVS.   Goals    . Patient Stated     Monitor my diet to keep my diabetes under control.      Depression Screen PHQ 2/9 Scores 09/30/2019 03/31/2019 09/30/2018 04/13/2016  PHQ - 2 Score 0 0 0 0  PHQ- 9 Score 3 - - -    Fall Risk Fall Risk  09/30/2019 09/30/2018 04/13/2016  Falls in the past year? 0 0 No  Number falls in past yr: 0 - -  Injury with Fall? 0 - -   Cognitive Function:       Ad8 score reviewed for issues:  Issues making decisions: no  Less interest in hobbies / activities: no  Repeats questions, stories (family complaining): no  Trouble using ordinary gadgets (microwave,  computer, phone):no  Forgets the month or year: no  Mismanaging finances: no  Remembering appts: no  Daily problems with thinking and/or memory: no Ad8 score is= 0  Screening Tests Health Maintenance  Topic Date Due  . PNA vac Low Risk Adult (2 of 2 - PPSV23) 01/29/2019  . FOOT EXAM  06/19/2019  . OPHTHALMOLOGY EXAM  09/30/2019  . HEMOGLOBIN A1C  03/20/2020  . MAMMOGRAM  04/08/2021  . TETANUS/TDAP  12/03/2028  . COLONOSCOPY  05/14/2029  . INFLUENZA VACCINE  Completed  . DEXA SCAN  Completed  . Hepatitis C Screening  Completed     Plan:     Reviewed health maintenance screenings with patient today and relevant education, vaccines, and/or referrals were provided.   I have personally reviewed and noted the following in the patient's chart:   . Medical and social history . Use of alcohol, tobacco or illicit drugs  . Current medications and supplements . Functional ability and status . Nutritional status . Physical activity . Advanced directives . List of other physicians . Vitals . Screenings to include cognitive, depression, and falls . Referrals and appointments  In addition, I have reviewed and discussed with patient certain preventive protocols, quality metrics, and best practice recommendations. A written personalized care plan for preventive services as well as general preventive health recommendations were provided to patient.     Michiel Cowboy, RN   09/30/2019   Medical screening examination/treatment/procedure(s) were performed by non-physician practitioner and as supervising provider I was immediately available for consultation/collaboration.  I agree with above. Marrian Salvage, FNP

## 2019-09-30 NOTE — Patient Instructions (Addendum)
Continue doing brain stimulating activities (puzzles, reading, adult coloring books, staying active) to keep memory sharp.   Continue to eat heart healthy diet (full of fruits, vegetables, whole grains, lean protein, water--limit salt, fat, and sugar intake) and increase physical activity as tolerated.   Mariah Peterson , Thank you for taking time to come for your Medicare Wellness Visit. I appreciate your ongoing commitment to your health goals. Please review the following plan we discussed and let me know if I can assist you in the future.   These are the goals we discussed: Goals    . Patient Stated     Monitor my diet to keep my diabetes under control.       This is a list of the screening recommended for you and due dates:  Health Maintenance  Topic Date Due  . Pneumonia vaccines (2 of 2 - PPSV23) 01/29/2019  . Complete foot exam   06/19/2019  . Eye exam for diabetics  09/30/2019  . Hemoglobin A1C  03/20/2020  . Mammogram  04/08/2021  . Tetanus Vaccine  12/03/2028  . Colon Cancer Screening  05/14/2029  . Flu Shot  Completed  . DEXA scan (bone density measurement)  Completed  .  Hepatitis C: One time screening is recommended by Center for Disease Control  (CDC) for  adults born from [birth year] through 1964.   Completed    Carbohydrate Counting for Diabetes Mellitus, Adult  Carbohydrate counting is a method of keeping track of how many carbohydrates you eat. Eating carbohydrates naturally increases the amount of sugar (glucose) in the blood. Counting how many carbohydrates you eat helps keep your blood glucose within normal limits, which helps you manage your diabetes (diabetes mellitus). It is important to know how many carbohydrates you can safely have in each meal. This is different for every person. A diet and nutrition specialist (registered dietitian) can help you make a meal plan and calculate how many carbohydrates you should have at each meal and snack. Carbohydrates are found in  the following foods:  Grains, such as breads and cereals.  Dried beans and soy products.  Starchy vegetables, such as potatoes, peas, and corn.  Fruit and fruit juices.  Milk and yogurt.  Sweets and snack foods, such as cake, cookies, candy, chips, and soft drinks. How do I count carbohydrates? There are two ways to count carbohydrates in food. You can use either of the methods or a combination of both. Reading "Nutrition Facts" on packaged food The "Nutrition Facts" list is included on the labels of almost all packaged foods and beverages in the [place] It includes:  The serving size.  Information about nutrients in each serving, including the grams (g) of carbohydrate per serving. To use the "Nutrition Facts":  Decide how many servings you will have.  Multiply the number of servings by the number of carbohydrates per serving.  The resulting number is the total amount of carbohydrates that you will be having. Learning standard serving sizes of other foods When you eat carbohydrate foods that are not packaged or do not include "Nutrition Facts" on the label, you need to measure the servings in order to count the amount of carbohydrates:  Measure the foods that you will eat with a food scale or measuring cup, if needed.  Decide how many standard-size servings you will eat.  Multiply the number of servings by 15. Most carbohydrate-rich foods have about 15 g of carbohydrates per serving. ? For example, if you eat 8 oz (  170 g) of strawberries, you will have eaten 2 servings and 30 g of carbohydrates (2 servings x 15 g = 30 g).  For foods that have more than one food mixed, such as soups and casseroles, you must count the carbohydrates in each food that is included. The following list contains standard serving sizes of common carbohydrate-rich foods. Each of these servings has about 15 g of carbohydrates:   hamburger bun or  English muffin.   oz (15 mL) syrup.   oz (14 g)  jelly.  1 slice of bread.  1 six-inch tortilla.  3 oz (85 g) cooked rice or pasta.  4 oz (113 g) cooked dried beans.  4 oz (113 g) starchy vegetable, such as peas, corn, or potatoes.  4 oz (113 g) hot cereal.  4 oz (113 g) mashed potatoes or  of a large baked potato.  4 oz (113 g) canned or frozen fruit.  4 oz (120 mL) fruit juice.  4-6 crackers.  6 chicken nuggets.  6 oz (170 g) unsweetened dry cereal.  6 oz (170 g) plain fat-free yogurt or yogurt sweetened with artificial sweeteners.  8 oz (240 mL) milk.  8 oz (170 g) fresh fruit or one small piece of fruit.  24 oz (680 g) popped popcorn. Example of carbohydrate counting Sample meal  3 oz (85 g) chicken breast.  6 oz (170 g) brown rice.  4 oz (113 g) corn.  8 oz (240 mL) milk.  8 oz (170 g) strawberries with sugar-free whipped topping. Carbohydrate calculation 1. Identify the foods that contain carbohydrates: ? Rice. ? Corn. ? Milk. ? Strawberries. 2. Calculate how many servings you have of each food: ? 2 servings rice. ? 1 serving corn. ? 1 serving milk. ? 1 serving strawberries. 3. Multiply each number of servings by 15 g: ? 2 servings rice x 15 g = 30 g. ? 1 serving corn x 15 g = 15 g. ? 1 serving milk x 15 g = 15 g. ? 1 serving strawberries x 15 g = 15 g. 4. Add together all of the amounts to find the total grams of carbohydrates eaten: ? 30 g + 15 g + 15 g + 15 g = 75 g of carbohydrates total. Summary  Carbohydrate counting is a method of keeping track of how many carbohydrates you eat.  Eating carbohydrates naturally increases the amount of sugar (glucose) in the blood.  Counting how many carbohydrates you eat helps keep your blood glucose within normal limits, which helps you manage your diabetes.  A diet and nutrition specialist (registered dietitian) can help you make a meal plan and calculate how many carbohydrates you should have at each meal and snack. This information is not  intended to replace advice given to you by your health care provider. Make sure you discuss any questions you have with your health care provider. Document Released: 10/08/2005 Document Revised: 05/02/2017 Document Reviewed: 03/21/2016 Elsevier Patient Education  2020 [place] 65 Years and Older, Female Preventive care refers to lifestyle choices and visits with your health care provider that can promote health and wellness. This includes:  A yearly physical exam. This is also called an annual well check.  Regular dental and eye exams.  Immunizations.  Screening for certain conditions.  Healthy lifestyle choices, such as diet and exercise. What can I expect for my preventive care visit? Physical exam Your health care provider will check:  Height and weight. These may  be used to calculate body mass index (BMI), which is a measurement that tells if you are at a healthy weight.  Heart rate and blood pressure.  Your skin for abnormal spots. Counseling Your health care provider may ask you questions about:  Alcohol, tobacco, and drug use.  Emotional well-being.  Home and relationship well-being.  Sexual activity.  Eating habits.  History of falls.  Memory and ability to understand (cognition).  Work and work Statistician.  Pregnancy and menstrual history. What immunizations do I need?  Influenza (flu) vaccine  This is recommended every year. Tetanus, diphtheria, and pertussis (Tdap) vaccine  You may need a Td booster every 10 years. Varicella (chickenpox) vaccine  You may need this vaccine if you have not already been vaccinated. Zoster (shingles) vaccine  You may need this after age 73. Pneumococcal conjugate (PCV13) vaccine  One dose is recommended after age 21. Pneumococcal polysaccharide (PPSV23) vaccine  One dose is recommended after age 12. Measles, mumps, and rubella (MMR) vaccine  You may need at least one dose of MMR if you  were born in 1957 or later. You may also need a second dose. Meningococcal conjugate (MenACWY) vaccine  You may need this if you have certain conditions. Hepatitis A vaccine  You may need this if you have certain conditions or if you travel or work in places where you may be exposed to hepatitis A. Hepatitis B vaccine  You may need this if you have certain conditions or if you travel or work in places where you may be exposed to hepatitis B. Haemophilus influenzae type b (Hib) vaccine  You may need this if you have certain conditions. You may receive vaccines as individual doses or as more than one vaccine together in one shot (combination vaccines). Talk with your health care provider about the risks and benefits of combination vaccines. What tests do I need? Blood tests  Lipid and cholesterol levels. These may be checked every 5 years, or more frequently depending on your overall health.  Hepatitis C test.  Hepatitis B test. Screening  Lung cancer screening. You may have this screening every year starting at age 21 if you have a 30-pack-year history of smoking and currently smoke or have quit within the past 15 years.  Colorectal cancer screening. All adults should have this screening starting at age 26 and continuing until age 61. Your health care provider may recommend screening at age 61 if you are at increased risk. You will have tests every 1-10 years, depending on your results and the type of screening test.  Diabetes screening. This is done by checking your blood sugar (glucose) after you have not eaten for a while (fasting). You may have this done every 1-3 years.  Mammogram. This may be done every 1-2 years. Talk with your health care provider about how often you should have regular mammograms.  BRCA-related cancer screening. This may be done if you have a family history of breast, ovarian, tubal, or peritoneal cancers. Other tests  Sexually transmitted disease (STD)  testing.  Bone density scan. This is done to screen for osteoporosis. You may have this done starting at age 51. Follow these instructions at home: Eating and drinking  Eat a diet that includes fresh fruits and vegetables, whole grains, lean protein, and low-fat dairy products. Limit your intake of foods with high amounts of sugar, saturated fats, and salt.  Take vitamin and mineral supplements as recommended by your health care provider.  Do not drink  alcohol if your health care provider tells you not to drink.  If you drink alcohol: ? Limit how much you have to 0-1 drink a day. ? Be aware of how much alcohol is in your drink. In the [place] one drink equals one 12 oz bottle of beer (355 mL), one 5 oz glass of [place] Crownover (148 mL), or one 1 oz glass of hard liquor (44 mL). Lifestyle  Take daily care of your teeth and gums.  Stay active. Exercise for at least 30 minutes on 5 or more days each week.  Do not use any products that contain nicotine or tobacco, such as cigarettes, e-cigarettes, and chewing tobacco. If you need help quitting, ask your health care provider.  If you are sexually active, practice safe sex. Use a condom or other form of protection in order to prevent STIs (sexually transmitted infections).  Talk with your health care provider about taking a low-dose aspirin or statin. What's next?  Go to your health care provider once a year for a well check visit.  Ask your health care provider how often you should have your eyes and teeth checked.  Stay up to date on all vaccines. This information is not intended to replace advice given to you by your health care provider. Make sure you discuss any questions you have with your health care provider. Document Released: 11/04/2015 Document Revised: 10/02/2018 Document Reviewed: 10/02/2018 Elsevier Patient Education  2020 [employer].

## 2019-10-28 ENCOUNTER — Telehealth: Payer: Self-pay

## 2019-10-28 NOTE — Telephone Encounter (Signed)
Printed out and waiting for Mickel Baas to sign when she is back in the office.

## 2019-10-28 NOTE — Telephone Encounter (Signed)
Copied from [place] [id number]. Topic: General - Inquiry >> Oct 28, 2019  4:12 PM Virl Axe D wrote: Reason for CRM: [facility] stated they faxed over a form because pt is wanting to have a Cardiac Genetic test through them. They need PCP to fill out paperwork and return fax. Please advise.

## 2019-10-29 NOTE — Telephone Encounter (Signed)
Mariah Peterson with [facility] called to check the status of form they faxed over for the patient  to have a Cardiac Genetic test through them. They need PCP to fill out paperwork and return by  fax. Per Mariah Peterson this is time sensitive and she can be reached at Ph# [phone]

## 2019-11-02 ENCOUNTER — Telehealth: Payer: Self-pay

## 2019-11-02 ENCOUNTER — Telehealth: Payer: Self-pay | Admitting: Family

## 2019-11-02 NOTE — Telephone Encounter (Signed)
Copied from [place] [id number]. Topic: General - Inquiry >> Oct 28, 2019  4:12 PM Virl Axe D wrote: Reason for CRM: [facility] stated they faxed over a form because pt is wanting to have a Cardiac Genetic test through them. They need PCP to fill out paperwork and return fax. Please advise. >> Nov 02, 2019  4:05 PM Keene Breath wrote: Called to check the status of the form to be signed by the doctor.  It is time sensitive.  CB# [phone]

## 2019-11-02 NOTE — Telephone Encounter (Signed)
Copied from [place] [id number]. Topic: General - Inquiry >> Oct 28, 2019  4:12 PM Virl Axe D wrote: Reason for CRM: [facility] stated they faxed over a form because pt is wanting to have a Cardiac Genetic test through them. They need PCP to fill out paperwork and return fax. Please advise.

## 2019-11-02 NOTE — Telephone Encounter (Signed)
Received requests for hip orthosis and ankle orthosis. I am sorry but I will not be able to order these for her. If she is having hip and ankle issues, she should come see sports medicine and they can decide what is appropriate treatment for her.

## 2019-11-02 NOTE — Telephone Encounter (Signed)
Called and spoke with [place] today. She has been told that provider will not be signing off on paperwork and has been advised to be ordered 3rd party.

## 2019-11-02 NOTE — Telephone Encounter (Signed)
Called and spoke with Mariah Peterson today. She has been told that provider will not be signing off on paperwork and has been advised to be ordered 3rd party.

## 2019-11-04 NOTE — Telephone Encounter (Signed)
Message left for patient today with info 

## 2019-11-09 ENCOUNTER — Other Ambulatory Visit: Payer: Self-pay | Admitting: Endocrinology

## 2019-11-13 ENCOUNTER — Telehealth: Payer: Self-pay | Admitting: Family

## 2019-11-13 NOTE — Telephone Encounter (Signed)
She would need to be seen- we will need to check her potassium, magnesium and B12 levels; getting her diabetes under control is important as well.

## 2019-11-13 NOTE — Telephone Encounter (Signed)
   Patient requesting advice for tingling in toes x2 days. No other symptoms Would like to know if there is an OTC med or RX she should start taking Please call

## 2019-11-16 ENCOUNTER — Ambulatory Visit (INDEPENDENT_AMBULATORY_CARE_PROVIDER_SITE_OTHER): Payer: Medicare Other | Admitting: Family

## 2019-11-16 ENCOUNTER — Ambulatory Visit (INDEPENDENT_AMBULATORY_CARE_PROVIDER_SITE_OTHER): Payer: Medicare Other

## 2019-11-16 ENCOUNTER — Encounter: Payer: Self-pay | Admitting: Family

## 2019-11-16 ENCOUNTER — Other Ambulatory Visit: Payer: Self-pay

## 2019-11-16 VITALS — BP 130/70 | HR 92 | Temp 98.4°F | Ht 59.0 in | Wt 97.4 lb

## 2019-11-16 DIAGNOSIS — R202 Paresthesia of skin: Secondary | ICD-10-CM | POA: Diagnosis not present

## 2019-11-16 DIAGNOSIS — R200 Anesthesia of skin: Secondary | ICD-10-CM

## 2019-11-16 LAB — COMPREHENSIVE METABOLIC PANEL
ALT: 11 U/L (ref 0–35)
AST: 17 U/L (ref 0–37)
Albumin: 4.3 g/dL (ref 3.5–5.2)
Alkaline Phosphatase: 38 U/L — ABNORMAL LOW (ref 39–117)
BUN: 49 mg/dL — ABNORMAL HIGH (ref 6–23)
CO2: 23 mEq/L (ref 19–32)
Calcium: 9.9 mg/dL (ref 8.4–10.5)
Chloride: 105 mEq/L (ref 96–112)
Creatinine, Ser: 1.78 mg/dL — ABNORMAL HIGH (ref 0.40–1.20)
GFR: 33.84 mL/min — ABNORMAL LOW (ref >60.00)
Glucose, Bld: 151 mg/dL — ABNORMAL HIGH (ref 70–99)
Potassium: 4.2 mEq/L (ref 3.5–5.1)
Sodium: 136 mEq/L (ref 135–145)
Total Bilirubin: 0.4 mg/dL (ref 0.2–1.2)
Total Protein: 7.3 g/dL (ref 6.0–8.3)

## 2019-11-16 LAB — MAGNESIUM: Magnesium: 2.2 mg/dL (ref 1.5–2.5)

## 2019-11-16 LAB — VITAMIN B12: Vitamin B-12: 343 pg/mL (ref 211–911)

## 2019-11-16 NOTE — Progress Notes (Signed)
Mariah Peterson is a 74 y.o. female with the following history as recorded in Epic[facility]:  Patient Active Problem List   Diagnosis Date Noted  . Glaucoma 03/31/2019  . Healthcare maintenance 12/03/2018  . Anemia 12/03/2018  . Hypertension 10/31/2018  . Hyperlipidemia 08/04/2013  . Diabetes mellitus ([place]) 08/04/2013    Current Outpatient Medications  Medication Sig Dispense Refill  . amLODipine-benazepril (LOTREL) 5-20 MG capsule TAKE 1 CAPSULE EVERY DAY 90 capsule 0  . calcium carbonate (OSCAL) 1500 (600 Ca) MG TABS tablet Take 1 tablet by mouth 2 (two) times daily with a meal.    . Continuous Blood Gluc Sensor (FREESTYLE LIBRE 14 DAY SENSOR) MISC Inject 1 each into the skin every 14 (fourteen) days. 2 each 5  . glucose blood test strip Use as instructed to test blood sugar 3 times daily E11.65 270 each 1  . insulin aspart (NOVOLOG FLEXPEN) 100 UNIT/ML FlexPen Inject 6 Units into the skin 3 (three) times daily with meals. Inject 6 units under the skin three times daily before meals.    . Insulin Glargine (LANTUS SOLOSTAR) 100 UNIT/ML Solostar Pen Inject 20 Units into the skin daily. Inject 20 units under the skin once daily.    . pioglitazone (ACTOS) 15 MG tablet Take 1 tablet (15 mg total) by mouth daily. 90 tablet 1  . rosuvastatin (CRESTOR) 40 MG tablet TAKE 1 TABLET EVERY DAY 90 tablet 1  . Travoprost, BAK Free, (TRAVATAN) 0.004 % SOLN ophthalmic solution Place 1 drop into both eyes at bedtime. 2.5 mL 1   No current facility-administered medications for this visit.    Allergies: Patient has no known allergies.  Past Medical History:  Diagnosis Date  . Diabetes mellitus without complication ([place])   . Glaucoma   . History of chicken pox   . Hypertension     Past Surgical History:  Procedure Laterality Date  . BREAST BIOPSY Right 2017   benign  . CATARACT EXTRACTION, BILATERAL Bilateral   . CESAREAN SECTION    . COLONOSCOPY  1980's  . TUBAL LIGATION      Family History   Problem Relation Age of Onset  . Hypertension Mother   . Diabetes Brother   . CAD Brother   . Hypertension Brother   . Diabetes Brother   . Hypertension Brother   . Colon cancer Neg Hx   . Esophageal cancer Neg Hx   . Pancreatic cancer Neg Hx   . Liver disease Neg Hx   . Stomach cancer Neg Hx   . Rectal cancer Neg Hx   . Colon polyps Neg Hx     Social History   Tobacco Use  . Smoking status: Never Smoker  . Smokeless tobacco: Never Used  Substance Use Topics  . Alcohol use: No    Subjective:  Patient had called last week requesting OTC treatment because she was experiencing "numbness and tingling" in 3 toes on her left foot for 3-4 days; is diabetic but historically very good control and no prior history of neuropathy; no known injury or trauma to her toes or low back; notes that symptoms most noticeable at night; specifically localized to her left foot (2nd-4th) toes.      Objective:  Vitals:   11/16/19 1542  BP: 130/70  Pulse: 92  Temp: 98.4 F (36.9 C)  TempSrc: Oral  SpO2: 99%  Weight: 97 lb 6.4 oz (44.2 kg)  Height: '4\' 11"'$  (1.499 m)    General: Well developed, well nourished, in  no acute distress  Skin : Warm and dry.  Head: Normocephalic and atraumatic  Lungs: Respirations unlabored; clear to auscultation bilaterally without wheeze, rales, rhonchi  Musculoskeletal: No deformities; no active joint inflammation  Extremities: No edema, cyanosis, clubbing  Vessels: Symmetric bilaterally  Neurologic: Alert and oriented; speech intact; face symmetrical; moves all extremities well; CNII-XII intact without focal deficit   Assessment:  1. Numbness and tingling of both lower extremities     Plan:  ? Etiology; update labs and lumbar X-ray today; if all of these are normal, will consider trial of Gabapentin; follow-up to be determined.   This visit occurred during the SARS-CoV-2 public health emergency.  Safety protocols were in place, including screening questions  prior to the visit, additional usage of staff PPE, and extensive cleaning of exam room while observing appropriate contact time as indicated for disinfecting solutions.    No follow-ups on file.  Orders Placed This Encounter  Procedures  . DG Lumbar Spine 2-3 Views    Standing Status:   Future    Standing Expiration Date:   01/13/2021    Order Specific Question:   Reason for Exam (SYMPTOM  OR DIAGNOSIS REQUIRED)    Answer:   radiculopathy    Order Specific Question:   Preferred imaging location?    Answer:   [facility]    Order Specific Question:   Radiology Contrast Protocol - do NOT remove file path    Answer:   [email or web address]  . B12  . Comp Met (CMET)  . Magnesium    Requested Prescriptions    No prescriptions requested or ordered in this encounter

## 2019-11-17 ENCOUNTER — Other Ambulatory Visit: Payer: Self-pay | Admitting: Family

## 2019-11-17 MED ORDER — GABAPENTIN 100 MG PO CAPS: 100.0000 mg | ORAL_CAPSULE | Freq: Every day | ORAL | 1 refills | Status: DC

## 2019-11-20 ENCOUNTER — Other Ambulatory Visit: Payer: Self-pay | Admitting: Family

## 2019-11-20 DIAGNOSIS — R7989 Other specified abnormal findings of blood chemistry: Secondary | ICD-10-CM

## 2019-12-28 ENCOUNTER — Other Ambulatory Visit (INDEPENDENT_AMBULATORY_CARE_PROVIDER_SITE_OTHER): Payer: Medicare Other

## 2019-12-28 ENCOUNTER — Other Ambulatory Visit: Payer: Self-pay

## 2019-12-28 DIAGNOSIS — Z794 Long term (current) use of insulin: Secondary | ICD-10-CM | POA: Diagnosis not present

## 2019-12-28 DIAGNOSIS — E1165 Type 2 diabetes mellitus with hyperglycemia: Secondary | ICD-10-CM | POA: Diagnosis not present

## 2019-12-29 LAB — BASIC METABOLIC PANEL
BUN: 31 mg/dL — ABNORMAL HIGH (ref 6–23)
CO2: 27 mEq/L (ref 19–32)
Calcium: 9.8 mg/dL (ref 8.4–10.5)
Chloride: 104 mEq/L (ref 96–112)
Creatinine, Ser: 1.48 mg/dL — ABNORMAL HIGH (ref 0.40–1.20)
GFR: 41.86 mL/min — ABNORMAL LOW (ref >60.00)
Glucose, Bld: 162 mg/dL — ABNORMAL HIGH (ref 70–99)
Potassium: 4.6 mEq/L (ref 3.5–5.1)
Sodium: 136 mEq/L (ref 135–145)

## 2019-12-29 LAB — HEMOGLOBIN A1C: Hgb A1c MFr Bld: 8.8 % — ABNORMAL HIGH (ref 4.6–6.5)

## 2019-12-29 LAB — FRUCTOSAMINE: Fructosamine: 422 umol/L — ABNORMAL HIGH (ref 0–285)

## 2019-12-30 NOTE — Telephone Encounter (Signed)
Faxed already

## 2019-12-31 ENCOUNTER — Other Ambulatory Visit: Payer: Self-pay

## 2019-12-31 ENCOUNTER — Ambulatory Visit (INDEPENDENT_AMBULATORY_CARE_PROVIDER_SITE_OTHER): Payer: Medicare Other | Admitting: Endocrinology

## 2019-12-31 ENCOUNTER — Encounter: Payer: Self-pay | Admitting: Endocrinology

## 2019-12-31 VITALS — BP 142/60 | HR 96 | Ht 59.0 in | Wt 101.8 lb

## 2019-12-31 DIAGNOSIS — Z794 Long term (current) use of insulin: Secondary | ICD-10-CM | POA: Diagnosis not present

## 2019-12-31 DIAGNOSIS — E119 Type 2 diabetes mellitus without complications: Secondary | ICD-10-CM | POA: Diagnosis not present

## 2019-12-31 DIAGNOSIS — I10 Essential (primary) hypertension: Secondary | ICD-10-CM

## 2019-12-31 DIAGNOSIS — N183 Chronic kidney disease, stage 3 unspecified: Secondary | ICD-10-CM | POA: Diagnosis not present

## 2019-12-31 DIAGNOSIS — E1165 Type 2 diabetes mellitus with hyperglycemia: Secondary | ICD-10-CM

## 2019-12-31 LAB — GLUCOSE, POCT (MANUAL RESULT ENTRY): POC Glucose: 198 mg/dl — AB (ref 70–99)

## 2019-12-31 NOTE — Progress Notes (Signed)
Patient ID: Mariah Peterson, female   DOB: [birth year], 74 y.o.   MRN: [id number]          Reason for Appointment: Follow-up for Type 2 Diabetes   History of Present Illness:          Date of diagnosis of type 2 diabetes mellitus:?  1990       Background history:   She was started on insulin about 4 years ago and previously taking metformin and glipizide No detailed history is available and she does not think she has taken any other types of diabetes medications or injections  Recent history:    INSULIN regimen [id number] 18 units daily in am, Novolog 5 units bid at lunch and dinner      Non-insulin hypoglycemic drugs the patient is taking are:, Actos 15 mg daily  Current management, blood sugar patterns and problems identified:  Her A1c  has stayed about the same at 8.8, previously 9.3   She is mostly checking her sugars in the mornings and only rarely later in the day  She appears to have significantly higher readings after her first meal which is usually around 11 AM  She thinks she is taking her NovoLog before starting to eat  However her Elenor Legato appears to be reading falsely low with her glucose 28 mg lower than the fingerstick reading today in the office  Minimal data available from her freestyle libre for the afternoon and evenings as she forgets to check her sugars more than once a day despite repeated reminders  Also she does not feel any hypoglycemic symptoms with low sugars in the mornings, today was 56  Blood sugars are tending to be low normal sensor reading between impaired 3-6 AM  Was discharged increasing her Lantus by 2 units on the last visit despite her meter  She was seen by diabetes educator in 9/19       Side effects from medications have been: None  Compliance with the medical regimen: Fair  Typical meal intake: Breakfast is  oatmeal, otherwise eggs and toast.  Lunch may be rice and beans or a sandwich at 4 pm Fruit for snacks                Exercise:  During the days is active with her grandson, not much formal exercise  Glucose monitoring:        Glucometer: Freestyle libre      CONTINUOUS GLUCOSE MONITORING RECORD INTERPRETATION    Dates of Recording: 2/26 through 3/11  Sensor description: [employer]  Results statistics:   CGM use % of time  50  Average and SD  129, GV 36  Time in range        79%  % Time Above 180  12  % Time above 250 2  % Time Below target 7    PRE-MEAL Fasting Lunch Dinner  4-6 AM Overall  Glucose range:       Mean/median:  124  131  151     POST-MEAL PC Breakfast PC Lunch PC Dinner  Glucose range:    169  Mean/median:  204  151     Glycemic patterns summary: Blood sugar data is available only between about 11 PM and 12 PM and 9 usually between 12 PM and 11 PM in the afternoon Blood sugars are low normal or low around 6 AM and appear to be peaking around 12-1 PM  Hyperglycemic episodes are occurring likely with her first meal around 11  AM and possibly late evening also  Hypoglycemic episodes occurred early morning between about 3 AM-6 AM but asymptomatic  Overnight periods: Blood sugars averaging about 150 at midnight and declining to low normal levels by 6 AM  Preprandial periods: Blood sugars around breakfast time are averaging around 140-150.  Her evening meal was documented at variable times  Postprandial periods:   After breakfast: Blood sugars are usually rising although not consistently to the same extent but usually going up an average of at least 55 mg    After lunch:   Blood sugar readings are not available She did not eat dinner  Previous data:     CGM use % of time  59  2-week average/SD  137 GV 41  Time in range      76%  % Time Above 180  10  % Time above 250  6  % Time Below 70  8     PRE-MEAL Fasting Lunch Dinner Bedtime Overall  Glucose range:       Averages:  106  189  134  153    POST-MEAL PC Breakfast PC Lunch PC Dinner  Glucose range:     Averages:  ?  189  201      Dietician visit, most recent: None CDE consultation: 07/01/2018  Weight history:  Wt Readings from Last 3 Encounters:  12/31/19 101 lb 12.8 oz (46.2 kg)  11/16/19 97 lb 6.4 oz (44.2 kg)  09/30/19 96 lb (43.5 kg)    Glycemic control:   Lab Results  Component Value Date   HGBA1C 8.8 (H) 12/28/2019   HGBA1C 8.8 (H) 09/21/2019   HGBA1C 9.3 (H) 07/06/2019   Lab Results  Component Value Date   MICROALBUR 12.3 (H) 09/21/2019   LDLCALC 60 09/21/2019   CREATININE 1.48 (H) 12/28/2019   Lab Results  Component Value Date   MICRALBCREAT 14.2 09/21/2019    Lab Results  Component Value Date   FRUCTOSAMINE 422 (H) 12/28/2019   FRUCTOSAMINE 524 (H) 05/01/2019   FRUCTOSAMINE CANCELED 04/27/2019    Office Visit on 12/31/2019  Component Date Value Ref Range Status  . POC Glucose 12/31/2019 198* 70 - 99 mg/dl Final  Lab on 12/28/2019  Component Date Value Ref Range Status  . Fructosamine 12/28/2019 422* 0 - 285 umol/L Final   Comment: Published reference interval for apparently healthy subjects between age 12 and 66 is 34 - 285 umol/L and in a poorly controlled diabetic population is 228 - 563 umol/L with a mean of 396 umol/L.   Marland Kitchen Sodium 12/28/2019 136  135 - 145 mEq/L Final  . Potassium 12/28/2019 4.6  3.5 - 5.1 mEq/L Final  . Chloride 12/28/2019 104  96 - 112 mEq/L Final  . CO2 12/28/2019 27  19 - 32 mEq/L Final  . Glucose, Bld 12/28/2019 162* 70 - 99 mg/dL Final  . BUN 12/28/2019 31* 6 - 23 mg/dL Final  . Creatinine, Ser 12/28/2019 1.48* 0.40 - 1.20 mg/dL Final  . GFR 12/28/2019 41.86* >60.00 mL/min Final  . Calcium 12/28/2019 9.8  8.4 - 10.5 mg/dL Final  . Hgb A1c MFr Bld 12/28/2019 8.8* 4.6 - 6.5 % Final   Glycemic Control Guidelines for People with Diabetes:Non Diabetic:  <6%Goal of Therapy: <7%Additional Action Suggested:  >8%     Allergies as of 12/31/2019   No Known Allergies     Medication List       Accurate as of December 31, 2019  4:54 PM.  If you  have any questions, ask your nurse or doctor.        amLODipine-benazepril 5-20 MG capsule Commonly known as: LOTREL TAKE 1 CAPSULE EVERY DAY   calcium carbonate 1500 (600 Ca) MG Tabs tablet Commonly known as: OSCAL Take 1 tablet by mouth 2 (two) times daily with a meal.   FreeStyle Libre 14 Day Sensor Misc Inject 1 each into the skin every 14 (fourteen) days.   gabapentin 100 MG capsule Commonly known as: NEURONTIN Take 1 capsule (100 mg total) by mouth at bedtime.   glucose blood test strip Use as instructed to test blood sugar 3 times daily E11.65   Lantus SoloStar 100 UNIT/ML Solostar Pen Generic drug: insulin glargine Inject 20 Units into the skin daily. Inject 20 units under the skin once daily.   NovoLOG FlexPen 100 UNIT/ML FlexPen Generic drug: insulin aspart Inject 6 Units into the skin 3 (three) times daily with meals. Inject 6 units under the skin three times daily before meals.   pioglitazone 15 MG tablet Commonly known as: Actos Take 1 tablet (15 mg total) by mouth daily.   rosuvastatin 40 MG tablet Commonly known as: CRESTOR TAKE 1 TABLET EVERY DAY   Travoprost (BAK Free) 0.004 % Soln ophthalmic solution Commonly known as: TRAVATAN Place 1 drop into both eyes at bedtime.       Allergies: No Known Allergies  Past Medical History:  Diagnosis Date  . Diabetes mellitus without complication ([place])   . Glaucoma   . History of chicken pox   . Hypertension     Past Surgical History:  Procedure Laterality Date  . BREAST BIOPSY Right 2017   benign  . CATARACT EXTRACTION, BILATERAL Bilateral   . CESAREAN SECTION    . COLONOSCOPY  1980's  . TUBAL LIGATION      Family History  Problem Relation Age of Onset  . Hypertension Mother   . Diabetes Brother   . CAD Brother   . Hypertension Brother   . Diabetes Brother   . Hypertension Brother   . Colon cancer Neg Hx   . Esophageal cancer Neg Hx   . Pancreatic cancer Neg Hx   . Liver disease  Neg Hx   . Stomach cancer Neg Hx   . Rectal cancer Neg Hx   . Colon polyps Neg Hx     Social History:  reports that she has never smoked. She has never used smokeless tobacco. She reports that she does not drink alcohol or use drugs.   Review of Systems   Lipid history: Currently on Crestor 40 mg with the following results   Lab Results  Component Value Date   CHOL 136 09/21/2019   HDL 58.00 09/21/2019   LDLCALC 60 09/21/2019   TRIG 93.0 09/21/2019   CHOLHDL 2 09/21/2019           Hypertension: Has been present for several years, on Lotrel now Occasionally checks at home also   BP Readings from Last 3 Encounters:  12/31/19 (!) 142/60  11/16/19 130/70  09/30/19 122/62   Renal function appears to be better again, not clear why it was worse in 1/21  No history of microalbuminuria  Lab Results  Component Value Date   CREATININE 1.48 (H) 12/28/2019   CREATININE 1.78 (H) 11/16/2019   CREATININE 1.40 (H) 09/21/2019    Most recent eye exam was in 5/19  Most recent foot exam: 9/20  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 POC Glucose 12/31/2019 198* 70 - 99 mg/dl Final  Lab on 12/28/2019  Component Date Value Ref Range Status  . Fructosamine 12/28/2019 422* 0 - 285 umol/L Final   Comment: Published reference interval for apparently healthy subjects between age 26 and 58 is 51 - 285 umol/L and in a poorly controlled diabetic population is 228 - 563 umol/L with a mean of 396 umol/L.   Marland Kitchen Sodium 12/28/2019 136  135 - 145 mEq/L Final  . Potassium 12/28/2019 4.6  3.5 - 5.1 mEq/L Final  . Chloride 12/28/2019 104  96 - 112 mEq/L Final  . CO2 12/28/2019 27  19 - 32 mEq/L Final  . Glucose, Bld 12/28/2019 162* 70 - 99 mg/dL Final  . BUN 12/28/2019 31* 6 - 23 mg/dL Final  . Creatinine, Ser 12/28/2019 1.48* 0.40 - 1.20 mg/dL Final  . GFR 12/28/2019 41.86* >60.00 mL/min Final   . Calcium 12/28/2019 9.8  8.4 - 10.5 mg/dL Final  . Hgb A1c MFr Bld 12/28/2019 8.8* 4.6 - 6.5 % Final   Glycemic Control Guidelines for People with Diabetes:Non Diabetic:  <6%Goal of Therapy: <7%Additional Action Suggested:  >8%     Physical Examination:  BP (!) 142/60 (BP Location: Left Arm, Patient Position: Sitting, Cuff Size: Normal)   Pulse 96   Ht 4\' 11"  (1.499 m)   Wt 101 lb 12.8 oz (46.2 kg)   SpO2 98%   BMI 20.56 kg/m        ASSESSMENT:  Diabetes type 2, insulin-dependent    A1c is now leveled at at 8.8  Blood sugars are falsely low on her freestyle libre She is on a basal bolus insulin regimen and Actos  See history of present illness for discussion of current diabetes management, blood sugar patterns and problems identified  She is having significant postprandial hyperglycemia at least with her first meal This is despite her trying to avoid high carbohydrate meals like cereals in the morning She is fairly regular with her NovoLog before meals Blood sugars overnight tend to fall below normal but not clear if she is hypoglycemic since her freestyle Elenor Legato is reading falsely low, 7% of her readings are below 70 and apparently  Main difficulty is her lack of glucose monitoring adequately and mostly checking blood sugars in the mornings only  HYPERTENSION: Her blood pressure is fairly well controlled overall with systolic around [id number] She thinks blood pressure is better at home  On Lotrel  Renal dysfunction: Her creatinine is variable but not persistently rising    PLAN:   She will need to increase her morning insulin to 8 units NovoLog Make sure she takes insulin before starting to eat She will usually take 6 units with her evening/afternoon meals but if she is eating only a snack she can skip the dose Again reminded her that her freestyle libre not giving [place] any information about her sugars later in the day and she needs to check at least once a day in the  evenings at 6 PM or later Avoid high-fat foods like [place] fries Call if blood sugars are still consistently over 200 after breakfast Reduce Lantus to 16 units now Make sure she takes Lantus in the morning on waking up  Patient Instructions  Check blood sugars on waking up 7 days a week  Also check blood sugars about 2 hours after meals and do this after different meals by rotation  Recommended blood sugar levels  on waking up are 90-130 and about 2 hours after meal is 130-160  Please bring your blood sugar monitor to each visit, thank you  Take 8 units NOVOLOG AT BRKFAST and 6 at dinner  Take Lantus to 16 not 18 units  With toast have egg or cheese type proten      Elayne Snare 12/31/2019, 4:54 PM   Note: This office note was prepared with Dragon voice recognition system technology. Any transcriptional errors that result from this process are unintentional.

## 2019-12-31 NOTE — Patient Instructions (Addendum)
Check blood sugars on waking up 7 days a week  Also check blood sugars about 2 hours after meals and do this after different meals by rotation  Recommended blood sugar levels on waking up are 90-130 and about 2 hours after meal is 130-160  Please bring your blood sugar monitor to each visit, thank you  Take 8 units NOVOLOG AT BRKFAST and 6 at dinner  Take Lantus to 16 not 18 units  With toast have egg or cheese type proten

## 2020-01-07 ENCOUNTER — Encounter: Payer: Self-pay | Admitting: Family

## 2020-01-07 NOTE — Progress Notes (Signed)
Outside notes received. Information abstracted. Notes sent to scan.  

## 2020-01-15 ENCOUNTER — Other Ambulatory Visit: Payer: Self-pay

## 2020-01-15 MED ORDER — LANTUS SOLOSTAR 100 UNIT/ML ~~LOC~~ SOPN: 20.0000 [IU] | PEN_INJECTOR | Freq: Every day | SUBCUTANEOUS | 1 refills | Status: DC

## 2020-01-18 ENCOUNTER — Other Ambulatory Visit: Payer: Self-pay

## 2020-01-18 MED ORDER — LANTUS SOLOSTAR 100 UNIT/ML ~~LOC~~ SOPN: PEN_INJECTOR | SUBCUTANEOUS | 1 refills | Status: DC

## 2020-01-18 MED ORDER — NOVOLOG FLEXPEN 100 UNIT/ML ~~LOC~~ SOPN: PEN_INJECTOR | SUBCUTANEOUS | 3 refills | Status: DC

## 2020-03-22 ENCOUNTER — Other Ambulatory Visit: Payer: Self-pay | Admitting: Endocrinology

## 2020-03-24 ENCOUNTER — Other Ambulatory Visit: Payer: Self-pay

## 2020-03-24 MED ORDER — LANTUS SOLOSTAR 100 UNIT/ML ~~LOC~~ SOPN: PEN_INJECTOR | SUBCUTANEOUS | 1 refills | Status: DC

## 2020-03-29 ENCOUNTER — Other Ambulatory Visit (INDEPENDENT_AMBULATORY_CARE_PROVIDER_SITE_OTHER): Payer: Medicare Other

## 2020-03-29 ENCOUNTER — Other Ambulatory Visit: Payer: Self-pay

## 2020-03-29 DIAGNOSIS — Z794 Long term (current) use of insulin: Secondary | ICD-10-CM | POA: Diagnosis not present

## 2020-03-29 DIAGNOSIS — E119 Type 2 diabetes mellitus without complications: Secondary | ICD-10-CM

## 2020-03-29 LAB — BASIC METABOLIC PANEL
BUN: 26 mg/dL — ABNORMAL HIGH (ref 6–23)
CO2: 27 mEq/L (ref 19–32)
Calcium: 9.6 mg/dL (ref 8.4–10.5)
Chloride: 105 mEq/L (ref 96–112)
Creatinine, Ser: 1.28 mg/dL — ABNORMAL HIGH (ref 0.40–1.20)
GFR: 49.46 mL/min — ABNORMAL LOW (ref >60.00)
Glucose, Bld: 111 mg/dL — ABNORMAL HIGH (ref 70–99)
Potassium: 3.9 mEq/L (ref 3.5–5.1)
Sodium: 138 mEq/L (ref 135–145)

## 2020-03-29 LAB — HEMOGLOBIN A1C: Hgb A1c MFr Bld: 9.1 % — ABNORMAL HIGH (ref 4.6–6.5)

## 2020-03-30 LAB — FRUCTOSAMINE: Fructosamine: 369 umol/L — ABNORMAL HIGH (ref 0–285)

## 2020-04-01 ENCOUNTER — Other Ambulatory Visit: Payer: Self-pay

## 2020-04-01 ENCOUNTER — Encounter: Payer: Self-pay | Admitting: Endocrinology

## 2020-04-01 ENCOUNTER — Ambulatory Visit (INDEPENDENT_AMBULATORY_CARE_PROVIDER_SITE_OTHER): Payer: Medicare Other | Admitting: Endocrinology

## 2020-04-01 VITALS — BP 134/68 | HR 95 | Ht 59.0 in | Wt 107.0 lb

## 2020-04-01 DIAGNOSIS — I10 Essential (primary) hypertension: Secondary | ICD-10-CM

## 2020-04-01 DIAGNOSIS — Z794 Long term (current) use of insulin: Secondary | ICD-10-CM | POA: Diagnosis not present

## 2020-04-01 DIAGNOSIS — N289 Disorder of kidney and ureter, unspecified: Secondary | ICD-10-CM | POA: Diagnosis not present

## 2020-04-01 DIAGNOSIS — E119 Type 2 diabetes mellitus without complications: Secondary | ICD-10-CM

## 2020-04-01 LAB — GLUCOSE, POCT (MANUAL RESULT ENTRY): POC Glucose: 156 mg/dl — AB (ref 70–99)

## 2020-04-01 NOTE — Progress Notes (Signed)
Medication Sample . Pt was given sample of Ozempic per Dr. Ronnie Derby instructions on 04/01/2020.  . Medication was signed out according to sample medication policy.  . Medication was labeled using approved label. . Medication sample was added to patient's medication list as a sample medication that had been given. . Medication was explained to patient and patient verbalized understanding of how to take properly.  . Pt was also instructed to call this office if any questions or concerns arise. Pt did verbalize understanding of this.  Lot number for medication is: [id number]  Expiration for medication is: 07/22/2022

## 2020-04-01 NOTE — Progress Notes (Signed)
Patient ID: Mariah Peterson, female   DOB: 09/28/1946, 74 y.o.   MRN: [id number]          Reason for Appointment: Follow-up for Type 2 Diabetes   History of Present Illness:          Date of diagnosis of type 2 diabetes mellitus:?  1990       Background history:   She was started on insulin about 4 years ago and previously taking metformin and glipizide No detailed history is available and she does not think she has taken any other types of diabetes medications or injections  Recent history:    INSULIN regimen [id number] 18 units daily in am, Novolog 6 units , 8 ac lunch and dinner      Non-insulin hypoglycemic drugs the patient is taking are:, Actos 15 mg daily  Current management, blood sugar patterns and problems identified:  Her A1c is 9.1, was 8.8 Fructosamine is high at 369   She is again checking her blood sugars with her freestyle libre when she wakes up and midday but not in the afternoons or evenings  With this her blood sugar patterns are not available between about 2 PM and 10 PM  However HIGHEST blood sugars again are around midday and late evening  She went off her diet after her birthday about 10 days ago and did not have good blood sugars for the rest of the week with several hypoglycemic episodes at different times  More recently however she has only sporadic high readings after breakfast and dinner  At least once she appears to have not taken her evening NovoLog at dinnertime even though she had a sandwich and a dessert  She was told to cut back Lantus by 2 units which she forgot  However overnight blood sugars are fairly good except once when she had low blood sugars around bedtime through 5 AM possibly from taking too much NovoLog for her evening meal  Today with only 1 meal and taking 8 units of Humalog her blood sugar postprandially is fairly good at 156  She was seen by diabetes educator in 9/19       Side effects from medications have been:  None  Compliance with the medical regimen: Fair  Typical meal intake: Breakfast is  oatmeal, otherwise eggs and toast.  Lunch may be rice and beans or a sandwich at 4 pm Fruit for snacks               Exercise:  During the days is active with her grandson, not much formal exercise  Glucose monitoring:        Glucometer: Freestyle libre    CGM use % of time  58  2-week average/SD  190, GV 36  Time in range      39%  % Time Above 180  41  % Time above 250  16  % Time Below 70 4     PRE-MEAL Fasting Lunch  4-6 PM  10 PM-12 AM Overall  Glucose range:       Averages:  175   214  226  190   POST-MEAL 10 AM-12 PM PC Lunch PC Dinner  Glucose range:     Averages:  219  146     Previous data:    CGM use % of time  50  Average and SD  129, GV 36  Time in range        79%  % Time Above 180  12  % Time above 250 2  % Time Below target 7    PRE-MEAL Fasting Lunch Dinner  4-6 AM Overall  Glucose range:       Mean/median:  124  131  151     POST-MEAL PC Breakfast PC Lunch PC Dinner  Glucose range:    169  Mean/median:  204  151          Dietician visit, most recent: 09/2018 CDE consultation: 07/01/2018  Weight history:  Wt Readings from Last 3 Encounters:  04/01/20 107 lb (48.5 kg)  12/31/19 101 lb 12.8 oz (46.2 kg)  11/16/19 97 lb 6.4 oz (44.2 kg)    Glycemic control:   Lab Results  Component Value Date   HGBA1C 9.1 (H) 03/29/2020   HGBA1C 8.8 (H) 12/28/2019   HGBA1C 8.8 (H) 09/21/2019   Lab Results  Component Value Date   MICROALBUR 12.3 (H) 09/21/2019   LDLCALC 60 09/21/2019   CREATININE 1.28 (H) 03/29/2020   Lab Results  Component Value Date   MICRALBCREAT 14.2 09/21/2019    Lab Results  Component Value Date   FRUCTOSAMINE 369 (H) 03/29/2020   FRUCTOSAMINE 422 (H) 12/28/2019   FRUCTOSAMINE 524 (H) 05/01/2019    Office Visit on 04/01/2020  Component Date Value Ref Range Status  . POC Glucose 04/01/2020 156* 70 - 99 mg/dl Final  Lab on  03/29/2020  Component Date Value Ref Range Status  . Hgb A1c MFr Bld 03/29/2020 9.1* 4.6 - 6.5 % Final   Glycemic Control Guidelines for People with Diabetes:Non Diabetic:  <6%Goal of Therapy: <7%Additional Action Suggested:  >8%   . Sodium 03/29/2020 138  135 - 145 mEq/L Final  . Potassium 03/29/2020 3.9  3.5 - 5.1 mEq/L Final  . Chloride 03/29/2020 105  96 - 112 mEq/L Final  . CO2 03/29/2020 27  19 - 32 mEq/L Final  . Glucose, Bld 03/29/2020 111* 70 - 99 mg/dL Final  . BUN 03/29/2020 26* 6 - 23 mg/dL Final  . Creatinine, Ser 03/29/2020 1.28* 0.40 - 1.20 mg/dL Final  . GFR 03/29/2020 49.46* >60.00 mL/min Final  . Calcium 03/29/2020 9.6  8.4 - 10.5 mg/dL Final  . Fructosamine 03/29/2020 369* 0 - 285 umol/L Final   Comment: Published reference interval for apparently healthy subjects between age 77 and 63 is 74 - 285 umol/L and in a poorly controlled diabetic population is 228 - 563 umol/L with a mean of 396 umol/L.     Allergies as of 04/01/2020   No Known Allergies     Medication List       Accurate as of April 01, 2020  4:35 PM. If you have any questions, ask your nurse or doctor.        amLODipine-benazepril 5-20 MG capsule Commonly known as: LOTREL TAKE 1 CAPSULE EVERY DAY   calcium carbonate 1500 (600 Ca) MG Tabs tablet Commonly known as: OSCAL Take 1 tablet by mouth 2 (two) times daily with a meal.   FreeStyle Libre 14 Day Sensor Misc Inject 1 each into the skin every 14 (fourteen) days.   gabapentin 100 MG capsule Commonly known as: NEURONTIN Take 1 capsule (100 mg total) by mouth at bedtime.   glucose blood test strip Use as instructed to test blood sugar 3 times daily E11.65   Lantus SoloStar 100 UNIT/ML Solostar Pen Generic drug: insulin glargine Inject 16 units under the skin once daily.   NovoLOG FlexPen 100 UNIT/ML FlexPen Generic drug: insulin aspart Inject 8  units under the skin before breakfast and 6 units before dinner.   pioglitazone 15 MG  tablet Commonly known as: Actos Take 1 tablet (15 mg total) by mouth daily.   rosuvastatin 40 MG tablet Commonly known as: CRESTOR TAKE 1 TABLET EVERY DAY   Travoprost (BAK Free) 0.004 % Soln ophthalmic solution Commonly known as: TRAVATAN Place 1 drop into both eyes at bedtime.       Allergies: No Known Allergies  Past Medical History:  Diagnosis Date  . Diabetes mellitus without complication ([place])   . Glaucoma   . History of chicken pox   . Hypertension     Past Surgical History:  Procedure Laterality Date  . BREAST BIOPSY Right 2017   benign  . CATARACT EXTRACTION, BILATERAL Bilateral   . CESAREAN SECTION    . COLONOSCOPY  1980's  . TUBAL LIGATION      Family History  Problem Relation Age of Onset  . Hypertension Mother   . Diabetes Brother   . CAD Brother   . Hypertension Brother   . Diabetes Brother   . Hypertension Brother   . Colon cancer Neg Hx   . Esophageal cancer Neg Hx   . Pancreatic cancer Neg Hx   . Liver disease Neg Hx   . Stomach cancer Neg Hx   . Rectal cancer Neg Hx   . Colon polyps Neg Hx     Social History:  reports that she has never smoked. She has never used smokeless tobacco. She reports that she does not drink alcohol and does not use drugs.   Review of Systems   Lipid history: Currently on Crestor 40 mg with the following results   Lab Results  Component Value Date   CHOL 136 09/21/2019   HDL 58.00 09/21/2019   LDLCALC 60 09/21/2019   TRIG 93.0 09/21/2019   CHOLHDL 2 09/21/2019           Hypertension: Has been present for several years, on Lotrel for some time Occasionally checks at home and has not had high readings   BP Readings from Last 3 Encounters:  04/01/20 134/68  12/31/19 (!) 142/60  11/16/19 130/70   Renal function appears to be better again, not clear why it was worse in 1/21  No history of microalbuminuria  Lab Results  Component Value Date   CREATININE 1.28 (H) 03/29/2020   CREATININE 1.48 (H)  12/28/2019   CREATININE 1.78 (H) 11/16/2019    Most recent eye exam was in 5/19  Most recent foot exam: 9/20  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  LABS:  Office Visit on 04/01/2020  Component Date Value Ref Range Status  . POC Glucose 04/01/2020 156* 70 - 99 mg/dl Final  Lab on 03/29/2020  Component Date Value Ref Range Status  . Hgb A1c MFr Bld 03/29/2020 9.1* 4.6 - 6.5 % Final   Glycemic Control Guidelines for People with Diabetes:Non Diabetic:  <6%Goal of Therapy: <7%Additional Action Suggested:  >8%   . Sodium 03/29/2020 138  135 - 145 mEq/L Final  . Potassium 03/29/2020 3.9  3.5 - 5.1 mEq/L Final  . Chloride 03/29/2020 105  96 - 112 mEq/L Final  . CO2 03/29/2020 27  19 - 32 mEq/L Final  . Glucose, Bld 03/29/2020 111* 70 - 99 mg/dL Final  . BUN 03/29/2020 26* 6 - 23 mg/dL Final  . Creatinine, Ser 03/29/2020 1.28* 0.40 - 1.20 mg/dL Final  . GFR 03/29/2020 49.46* >60.00 mL/min Final  .  Calcium 03/29/2020 9.6  8.4 - 10.5 mg/dL Final  . Fructosamine 03/29/2020 369* 0 - 285 umol/L Final   Comment: Published reference interval for apparently healthy subjects between age 13 and 35 is 66 - 285 umol/L and in a poorly controlled diabetic population is 228 - 563 umol/L with a mean of 396 umol/L.     Physical Examination:  BP 134/68   Pulse 95   Ht 4\' 11"  (1.499 m)   Wt 107 lb (48.5 kg)   SpO2 100%   BMI 21.61 kg/m        ASSESSMENT:  Diabetes type 2, insulin-dependent    A1c is again significantly high at 9.1 and fructosamine is 369  See history of present illness for discussion of current diabetes management, blood sugar patterns and problems identified  She is on a basal bolus insulin regimen and Actos Unlikely that she had benefited from Metformin in the past and this was stopped because of renal dysfunction  She is having significant postprandial hyperglycemia at variable times although less in the last few days Some of this is from  missing her NovoLog doses such as last night at dinnertime  She still does not monitor blood sugars adequately and mostly checking blood sugars in the mornings only, difficult to analyze her blood sugars in the afternoons  HYPERTENSION: Her blood pressure is fairly well controlled on Lotrel  Renal dysfunction: Her creatinine is progressively improving    PLAN:   She will need to check her blood sugars at least 3 times a day including in the evenings instead of just in the morning She will use 8 units NovoLog before eating a larger supper meal like sandwich or adding dessert to her evening meal  Cut back on high fat foods Trial of OZEMPIC with a sample Explained to the patient what GLP-1 drugs are, the sites of actions and the body, reduction of hunger sensation and improved insulin secretion.  Discussed the benefit of weight loss with these medications. Explained possible side effects of OZEMPIC, most commonly nausea that usually improves over time.  Also explained safety information associated with the medication Demonstrated the medication injection device and injection technique to the patient.  To start the injections with the 0.25 mg dosage weekly for the first 4 injections and then go up to 0.5 mg weekly if no continued nausea  Patient brochure on Ozempic with sample pen given  She will reduce her Lantus to 16 units while starting Ozempic but advised her that she may have to reduce it further along with reduction of NovoLog insulin when her Ozempic appears to be more effective She will let us know if she has any nausea with Ozempic She will try to make sure she is takes all her NovoLog doses before eating and not missed any doses Encourage her to walk regularly  Patient Instructions  Start OZEMPIC injections by dialing 0.25 mg on the pen as shown once weekly on the same day of the week.   You may inject in the sides of the stomach, outer thigh or arm as indicated in the brochure  given. If you have any difficulties using the pen see the video at [email or web address]  You will feel fullness of the stomach with starting the medication and should try to keep the portions at meals small.  You may experience nausea in the first few days which usually gets better over time    After 4 weeks increase the dose to 0.5 mg weekly  If you have any questions or persistent side effects please call the office   You may also talk to a nurse educator with [employer] at [phone] Useful website: [email or web address]   Lantus 16 units daily  For larger dinner meals take 8 units Novolog       Elayne Snare 04/01/2020, 4:35 PM   Note: This office note was prepared with Dragon voice recognition system technology. Any transcriptional errors that result from this process are unintentional.

## 2020-04-01 NOTE — Patient Instructions (Addendum)
Start OZEMPIC injections by dialing 0.25 mg on the pen as shown once weekly on the same day of the week.   You may inject in the sides of the stomach, outer thigh or arm as indicated in the brochure given. If you have any difficulties using the pen see the video at [email or web address]  You will feel fullness of the stomach with starting the medication and should try to keep the portions at meals small.  You may experience nausea in the first few days which usually gets better over time    After 4 weeks increase the dose to 0.5 mg weekly  If you have any questions or persistent side effects please call the office   You may also talk to a nurse educator with [employer] at [phone] Useful website: [email or web address]   Lantus 16 units daily  For larger dinner meals take 8 units Novolog

## 2020-04-05 ENCOUNTER — Encounter (INDEPENDENT_AMBULATORY_CARE_PROVIDER_SITE_OTHER): Payer: Medicare Other | Admitting: Ophthalmology

## 2020-05-18 ENCOUNTER — Ambulatory Visit (INDEPENDENT_AMBULATORY_CARE_PROVIDER_SITE_OTHER): Payer: Medicare Other | Admitting: Endocrinology

## 2020-05-18 ENCOUNTER — Other Ambulatory Visit: Payer: Self-pay

## 2020-05-18 ENCOUNTER — Encounter: Payer: Self-pay | Admitting: Endocrinology

## 2020-05-18 VITALS — BP 140/70 | HR 84 | Ht 59.0 in | Wt 102.0 lb

## 2020-05-18 DIAGNOSIS — E1165 Type 2 diabetes mellitus with hyperglycemia: Secondary | ICD-10-CM | POA: Diagnosis not present

## 2020-05-18 DIAGNOSIS — E7800 Pure hypercholesterolemia, unspecified: Secondary | ICD-10-CM | POA: Diagnosis not present

## 2020-05-18 DIAGNOSIS — Z794 Long term (current) use of insulin: Secondary | ICD-10-CM

## 2020-05-18 MED ORDER — OZEMPIC (0.25 OR 0.5 MG/DOSE) 2 MG/1.5ML ~~LOC~~ SOPN: 0.5000 mg | PEN_INJECTOR | SUBCUTANEOUS | 2 refills | Status: DC

## 2020-05-18 NOTE — Progress Notes (Signed)
Patient ID: Mariah Peterson, female   DOB: [birth year], 74 y.o.   MRN: [id number]          Reason for Appointment: Follow-up for Type 2 Diabetes   History of Present Illness:          Date of diagnosis of type 2 diabetes mellitus:?  1990       Background history:   She was started on insulin about 4 years ago and previously taking metformin and glipizide No detailed history is available and she does not think she has taken any other types of diabetes medications or injections  Recent history:    INSULIN regimen is: Lantus 18 units daily in am, Novolog 6 units, 6 ac lunch    Non-insulin hypoglycemic drugs the patient is taking are: Actos 15 mg daily, Ozempic 0.5 mg weekly  Current management, blood sugar patterns and problems identified:  Her A1c is last 9.1, was 8.8 Fructosamine was high at 369 in 6/21   She is able to use Ozempic without side effects, this was started as a sample in June  She has gone up to the 0.5 dose and she thinks she has taken 2 of these shots  Her blood sugar has been much better and in the last 30 days averaging about 126 compared to 190 previously  She was told to reduce Lantus to 16 and possibly further but she is still taking 18 units  As before she is checking her blood sugars once or twice a day and usually not in the evenings  However she said that she is trying to eat healthier along with skipping evening meal usually  With this she has lost 5 pounds  hyperglycemia occurs only sporadically based on her diet either morning, lunchtime and rarely in the evening  She thinks she is taking 6 units for both breakfast and lunch lately of the NovoLog  No hypoglycemia noted, rarely low normal reading after midnight  She was seen by diabetes educator in 9/19       Side effects from medications have been: None  Compliance with the medical regimen: Fair  Typical meal intake: Breakfast is oatmeal, otherwise eggs and toast.  Lunch may be rice and  beans or a sandwich at 4 pm Fruit for snacks               Exercise:  During the days is active with her grandson, not much formal exercise  Glucose monitoring:        Glucometer: Freestyle libre   CGM use % of time  54  2-week average/SD   Time in range  92, was 39%  % Time Above 180  6  % Time above 250   % Time Below 70 2     PRE-MEAL Fasting Lunch Dinner Bedtime Overall  Glucose range:       Averages:  106  133  141   129   POST-MEAL PC Breakfast PC Lunch PC Dinner  Glucose range:     Averages:  160  160  158    Previous data:   CGM use % of time  58  2-week average/SD  190, GV 36  Time in range      39%  % Time Above 180  41  % Time above 250  16  % Time Below 70 4     PRE-MEAL Fasting Lunch  4-6 PM  10 PM-12 AM Overall  Glucose range:  Averages:  175   214  226  190   POST-MEAL 10 AM-12 PM PC Lunch PC Dinner  Glucose range:     Averages:  219  146     Dietician visit, most recent: 09/2018 CDE consultation: 07/01/2018  Weight history:  Wt Readings from Last 3 Encounters:  05/18/20 102 lb (46.3 kg)  04/01/20 107 lb (48.5 kg)  12/31/19 101 lb 12.8 oz (46.2 kg)    Glycemic control:   Lab Results  Component Value Date   HGBA1C 9.1 (H) 03/29/2020   HGBA1C 8.8 (H) 12/28/2019   HGBA1C 8.8 (H) 09/21/2019   Lab Results  Component Value Date   MICROALBUR 12.3 (H) 09/21/2019   LDLCALC 60 09/21/2019   CREATININE 1.28 (H) 03/29/2020   Lab Results  Component Value Date   MICRALBCREAT 14.2 09/21/2019    Lab Results  Component Value Date   FRUCTOSAMINE 369 (H) 03/29/2020   FRUCTOSAMINE 422 (H) 12/28/2019   FRUCTOSAMINE 524 (H) 05/01/2019    No visits with results within 1 Week(s) from this visit.  Latest known visit with results is:  Office Visit on 04/01/2020  Component Date Value Ref Range Status   POC Glucose 04/01/2020 156* 70 - 99 mg/dl Final    Allergies as of 05/18/2020   No Known Allergies     Medication List        Accurate as of May 18, 2020  4:42 PM. If you have any questions, ask your nurse or doctor.        amLODipine-benazepril 5-20 MG capsule Commonly known as: LOTREL TAKE 1 CAPSULE EVERY DAY   calcium carbonate 1500 (600 Ca) MG Tabs tablet Commonly known as: OSCAL Take 1 tablet by mouth 2 (two) times daily with a meal.   FreeStyle Libre 14 Day Sensor Misc Inject 1 each into the skin every 14 (fourteen) days.   gabapentin 100 MG capsule Commonly known as: NEURONTIN Take 1 capsule (100 mg total) by mouth at bedtime.   glucose blood test strip Use as instructed to test blood sugar 3 times daily E11.65   Lantus SoloStar 100 UNIT/ML Solostar Pen Generic drug: insulin glargine Inject 16 units under the skin once daily.   NovoLOG FlexPen 100 UNIT/ML FlexPen Generic drug: insulin aspart Inject 8 units under the skin before breakfast and 6 units before dinner.   Ozempic (0.25 or 0.5 MG/DOSE) 2 MG/1.5ML Sopn Generic drug: Semaglutide(0.25 or 0.5MG /DOS) Inject 0.375 mLs (0.5 mg total) into the skin once a week. Started by: Elayne Snare, MD   pioglitazone 15 MG tablet Commonly known as: Actos Take 1 tablet (15 mg total) by mouth daily.   rosuvastatin 40 MG tablet Commonly known as: CRESTOR TAKE 1 TABLET EVERY DAY   Travoprost (BAK Free) 0.004 % Soln ophthalmic solution Commonly known as: TRAVATAN Place 1 drop into both eyes at bedtime.       Allergies: No Known Allergies  Past Medical History:  Diagnosis Date   Diabetes mellitus without complication ([facility])    Glaucoma    History of chicken pox    Hypertension     Past Surgical History:  Procedure Laterality Date   BREAST BIOPSY Right 2017   benign   CATARACT EXTRACTION, BILATERAL Bilateral    CESAREAN SECTION     COLONOSCOPY  1980's   TUBAL LIGATION      Family History  Problem Relation Age of Onset   Hypertension Mother    Diabetes Brother    CAD Brother  Hypertension Brother    Diabetes  Brother    Hypertension Brother    Colon cancer Neg Hx    Esophageal cancer Neg Hx    Pancreatic cancer Neg Hx    Liver disease Neg Hx    Stomach cancer Neg Hx    Rectal cancer Neg Hx    Colon polyps Neg Hx     Social History:  reports that she has never smoked. She has never used smokeless tobacco. She reports that she does not drink alcohol and does not use drugs.   Review of Systems  Following is a copy of the previous note  Lipid history: Currently on Crestor 40 mg with the following results   Lab Results  Component Value Date   CHOL 136 09/21/2019   HDL 58.00 09/21/2019   LDLCALC 60 09/21/2019   TRIG 93.0 09/21/2019   CHOLHDL 2 09/21/2019           Hypertension: Has been present for several years, on Lotrel for some time Occasionally checks at home and has not had high readings   BP Readings from Last 3 Encounters:  05/18/20 (!) 140/70  04/01/20 134/68  12/31/19 (!) 142/60   Renal function appears to be better again, not clear why it was worse in 1/21  No history of microalbuminuria  Lab Results  Component Value Date   CREATININE 1.28 (H) 03/29/2020   CREATININE 1.48 (H) 12/28/2019   CREATININE 1.78 (H) 11/16/2019    Most recent eye exam was in 5/19  Most recent foot exam: 9/20  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  LABS:  No visits with results within 1 Week(s) from this visit.  Latest known visit with results is:  Office Visit on 04/01/2020  Component Date Value Ref Range Status   POC Glucose 04/01/2020 156* 70 - 99 mg/dl Final    Physical Examination:  BP (!) 140/70 (BP Location: Left Arm, Patient Position: Sitting, Cuff Size: Normal)    Pulse 84    Ht 4\' 11"  (1.499 m)    Wt 102 lb (46.3 kg)    SpO2 96%    BMI 20.60 kg/m        ASSESSMENT:  Diabetes type 2, insulin-dependent    A1c is on the last visit significantly high at 9.1 and fructosamine was 369  See history of present illness for discussion of  current diabetes management, blood sugar patterns and problems identified  She is on Ozempic in addition to basal bolus insulin regimen and Actos She has much better control with adding Ozempic and probably had blood sugars even with the 0.25 mg dose No side effects with this She is likely eating less since she has lost 5 pounds but has no nausea She has not needed to change her insulin doses with adding Ozempic following may be getting less insulin to cover lunch meal   PLAN:   She will use 0.25 mg weekly of Ozempic for now Prescription sent for 20-month supply If her morning sugars tend to be below target she will reduce Lantus by 2 units Otherwise reminded her to check more readings after lunch and at bedtime regularly  Patient Instructions  Take only Ozempic 0.25mg  weekly  Check blood sugars on waking up 7 days a week  Also check blood sugars about 2 hours after meals and do this after different meals by rotation  Recommended blood sugar levels on waking up are 90-130 and about 2 hours after meal is 130-160  Please  bring your blood sugar monitor to each visit, thank you       Elayne Snare 05/18/2020, 4:42 PM   Note: This office note was prepared with Dragon voice recognition system technology. Any transcriptional errors that result from this process are unintentional.

## 2020-05-18 NOTE — Patient Instructions (Signed)
Take only Ozempic 0.25mg  weekly  Check blood sugars on waking up 7 days a week  Also check blood sugars about 2 hours after meals and do this after different meals by rotation  Recommended blood sugar levels on waking up are 90-130 and about 2 hours after meal is 130-160  Please bring your blood sugar monitor to each visit, thank you

## 2020-05-24 ENCOUNTER — Ambulatory Visit: Payer: Medicare Other | Admitting: Nutrition

## 2020-05-25 ENCOUNTER — Other Ambulatory Visit: Payer: Self-pay

## 2020-05-25 ENCOUNTER — Encounter: Payer: Medicare Other | Attending: Endocrinology | Admitting: Nutrition

## 2020-05-25 DIAGNOSIS — E1165 Type 2 diabetes mellitus with hyperglycemia: Secondary | ICD-10-CM | POA: Diagnosis present

## 2020-05-26 ENCOUNTER — Telehealth: Payer: Self-pay

## 2020-05-26 NOTE — Telephone Encounter (Signed)
FAXED [place]: [place] for [employer] Other records requested: Office notes  All above requested information has been faxed successfully to [employer] listed above. Documents and fax confirmation have been placed in the faxed file for future reference.

## 2020-05-28 NOTE — Patient Instructions (Addendum)
1.  Stop sweetened sodas ac S and switch to water with artifical sweetened packets, or lemon/lime juice. 2. Continue exercise,  3.  Take 1 extra unit of Novolog if drinking regular soda acS 4.  Attach social security statement to financial assistance from and return to Dr Ronnie Derby office for help paying for [employer]

## 2020-05-28 NOTE — Progress Notes (Signed)
Patient was identified by name and DOB.  She is here today with her grandson, which she is raising.   Insulin dose/Medication:  Lantus 16u AM, Novolog 8u acB (11AM), 6u acS   Ozempic.  She is not taking this because she can not afford $200./month.  SBGM:  Did not bring meter.  Tests fbs only.   AcB: 60-130  AcS: 175-300.   Typical day: 7AM: up tests blood sugar 60-130 from memory.  Coffee with artificial sweetener.   10AM: walks for 30-60 minutes 11AM: 8uNovolog hamburger and small fries from [employer] with diet coke 3PM: 6 peanut butter crackers 4PM: plays basket ball with grandson for 30 min.  5PM: Dinner: fish or chicken, 1/2 time fried,  Slaw, green beens or corn,  Diet coke to drink 1-2 times/wk. Regular Coke.   Discussion: 1.  Discussed how high blood sugar rises from regular soda.  Suggestions given for other things to drink that are non caloric 2.  Praised her for exercise and discussed importance of this for better blood sugar control.   3.  Possibility of financial assistance from [employer] 4.  Need for more Novolog acS when drinking sweetened Coke.  This will cause more weight gain.   5.  Questions answered about how Ozempic works and why it is important to take this. Suggestions given/written: 1.  Stop sweetened sodas ac S and switch to water with artifical sweetened packets.  Sample given 2. Continue exercise,  3.  Take 1 extra unit of Novolog if drinking regular soda acS  Form for finacial assistance filled out and given to her to add her statement of monthly income from social security.  She will need to mail application to Dr. Ronnie Derby office.  Address given to her for this.

## 2020-06-29 ENCOUNTER — Telehealth: Payer: Self-pay | Admitting: Endocrinology

## 2020-06-29 NOTE — Telephone Encounter (Signed)
PA forms completed and placed for signature on MDs desk

## 2020-07-08 ENCOUNTER — Telehealth: Payer: Self-pay | Admitting: Family

## 2020-07-08 NOTE — Telephone Encounter (Signed)
In regards to the requests we received for her:  1) She needs to ask Dr. Dwyane Dee about the continuous glucose monitor. Since we aren't managing her diabetes, we would not be the ones to order this for her.  2) We will not be ordering any type of hip orthosis for her. She will need to see an orthopedist to discuss this.   Thanks-

## 2020-07-08 NOTE — Telephone Encounter (Signed)
Patient advised.

## 2020-07-11 ENCOUNTER — Telehealth: Payer: Self-pay | Admitting: Family

## 2020-07-11 NOTE — Telephone Encounter (Signed)
error 

## 2020-07-11 NOTE — Telephone Encounter (Signed)
Mariah Peterson with [employer] called and was wondering the status of the papers that were faxed in regards to the free style libre and the hip orthosis and I informed him of what Jodi Mourning FNP had said and he was wondering if the papers could be faxed back with updated information. Please call Elta Guadeloupe in regards.   Mariah Peterson: [phone] IRW:[phone]

## 2020-07-13 ENCOUNTER — Telehealth: Payer: Self-pay

## 2020-07-13 NOTE — Telephone Encounter (Signed)
New message    Mariah Peterson from [facility] checking on a fax that was sent on  9.14.21   Fax # [phone]

## 2020-07-14 NOTE — Telephone Encounter (Signed)
Attempted to contact [place] and the number just keep going to a vm for a different person. If they call back can we could get some more detail about what they are needing

## 2020-07-18 NOTE — Telephone Encounter (Signed)
Information faxed again

## 2020-07-18 NOTE — Telephone Encounter (Signed)
[facility] [employer] called asking about if we could send all documentation for the Freestyle Libra.  Fax# [phone]

## 2020-08-15 ENCOUNTER — Other Ambulatory Visit: Payer: Self-pay

## 2020-08-15 ENCOUNTER — Other Ambulatory Visit (INDEPENDENT_AMBULATORY_CARE_PROVIDER_SITE_OTHER): Payer: Medicare Other

## 2020-08-15 DIAGNOSIS — E7800 Pure hypercholesterolemia, unspecified: Secondary | ICD-10-CM

## 2020-08-15 DIAGNOSIS — E1165 Type 2 diabetes mellitus with hyperglycemia: Secondary | ICD-10-CM | POA: Diagnosis not present

## 2020-08-15 DIAGNOSIS — E119 Type 2 diabetes mellitus without complications: Secondary | ICD-10-CM

## 2020-08-15 DIAGNOSIS — Z794 Long term (current) use of insulin: Secondary | ICD-10-CM

## 2020-08-15 LAB — COMPREHENSIVE METABOLIC PANEL
ALT: 12 U/L (ref 0–35)
AST: 19 U/L (ref 0–37)
Albumin: 4.2 g/dL (ref 3.5–5.2)
Alkaline Phosphatase: 49 U/L (ref 39–117)
BUN: 25 mg/dL — ABNORMAL HIGH (ref 6–23)
CO2: 26 mEq/L (ref 19–32)
Calcium: 9.7 mg/dL (ref 8.4–10.5)
Chloride: 104 mEq/L (ref 96–112)
Creatinine, Ser: 1.51 mg/dL — ABNORMAL HIGH (ref 0.40–1.20)
GFR: 34.11 mL/min — ABNORMAL LOW (ref >60.00)
Glucose, Bld: 208 mg/dL — ABNORMAL HIGH (ref 70–99)
Potassium: 4.6 mEq/L (ref 3.5–5.1)
Sodium: 138 mEq/L (ref 135–145)
Total Bilirubin: 0.7 mg/dL (ref 0.2–1.2)
Total Protein: 6.9 g/dL (ref 6.0–8.3)

## 2020-08-15 LAB — LIPID PANEL
Cholesterol: 167 mg/dL (ref 0–200)
HDL: 58.9 mg/dL (ref >39.00)
LDL Cholesterol: 90 mg/dL (ref 0–99)
NonHDL: 108.47
Total CHOL/HDL Ratio: 3
Triglycerides: 91 mg/dL (ref 0.0–149.0)
VLDL: 18.2 mg/dL (ref 0.0–40.0)

## 2020-08-15 LAB — MICROALBUMIN / CREATININE URINE RATIO
Creatinine,U: 130.3 mg/dL
Microalb Creat Ratio: 17.4 mg/g (ref 0.0–30.0)
Microalb, Ur: 22.6 mg/dL — ABNORMAL HIGH (ref 0.0–1.9)

## 2020-08-15 LAB — HEMOGLOBIN A1C: Hgb A1c MFr Bld: 11 % — ABNORMAL HIGH (ref 4.6–6.5)

## 2020-08-16 LAB — FRUCTOSAMINE: Fructosamine: 518 umol/L — ABNORMAL HIGH (ref 0–285)

## 2020-08-18 ENCOUNTER — Ambulatory Visit (INDEPENDENT_AMBULATORY_CARE_PROVIDER_SITE_OTHER): Payer: Medicare Other | Admitting: Endocrinology

## 2020-08-18 ENCOUNTER — Encounter: Payer: Self-pay | Admitting: Endocrinology

## 2020-08-18 ENCOUNTER — Other Ambulatory Visit: Payer: Self-pay

## 2020-08-18 VITALS — BP 150/82 | HR 76 | Resp 99 | Ht 59.0 in | Wt 96.4 lb

## 2020-08-18 DIAGNOSIS — E1165 Type 2 diabetes mellitus with hyperglycemia: Secondary | ICD-10-CM | POA: Diagnosis not present

## 2020-08-18 DIAGNOSIS — E7800 Pure hypercholesterolemia, unspecified: Secondary | ICD-10-CM

## 2020-08-18 DIAGNOSIS — Z23 Encounter for immunization: Secondary | ICD-10-CM

## 2020-08-18 DIAGNOSIS — Z794 Long term (current) use of insulin: Secondary | ICD-10-CM

## 2020-08-18 DIAGNOSIS — N183 Chronic kidney disease, stage 3 unspecified: Secondary | ICD-10-CM

## 2020-08-18 MED ORDER — OZEMPIC (0.25 OR 0.5 MG/DOSE) 2 MG/1.5ML ~~LOC~~ SOPN: 0.5000 mg | PEN_INJECTOR | SUBCUTANEOUS | 1 refills | Status: DC

## 2020-08-18 NOTE — Progress Notes (Signed)
Patient ID: Mariah Peterson, female   DOB: 1946/04/16, 74 y.o.   MRN: [id number]          Reason for Appointment: Follow-up for Type 2 Diabetes   History of Present Illness:          Date of diagnosis of type 2 diabetes mellitus:?  1990       Background history:   She was started on insulin about 4 years ago and previously taking metformin and glipizide No detailed history is available and she does not think she has taken any other types of diabetes medications or injections  Recent history:    INSULIN regimen is: Lantus 18 units daily in am, Novolog 8 units, 6 ac lunch    Non-insulin hypoglycemic drugs the patient is taking are: Actos 15 mg daily, Ozempic 0.5 mg weekly, currently not taking  Current management, blood sugar patterns and problems identified:  Her A1c is now higher at 11 compared to 9.1   She is not able to continue Ozempic because of lack of insurance coverage  She did not know she ran out a couple months ago and her patient assistance program was not processed  With this her blood sugars are markedly increased despite her stating that she is taking her insulin regularly  Also blood sugars are mostly higher overnight even though previously she may have had low normal readings in the mornings with the same dose of Lantus  She is likely not getting any protein at breakfast with eating mostly toast  She has had blood sugars close to 500 midday and overall high postprandial readings from her CGM  As before she is checking her blood sugars very sporadically and only once a day  Does not understand that she needs to check her sugars at all different times even with not eating a proper meal at dinnertime  She has lost 7 pounds  Lab glucose was 208 late morning  She was seen by diabetes educator in 9/19       Side effects from medications have been: None  Compliance with the medical regimen: Fair  Typical meal intake: Breakfast is oatmeal, otherwise eggs and  toast.  Lunch may be rice and beans or a sandwich at 4 pm Fruit for snacks               Exercise:  During the days is active with her grandson, not much formal exercise  Glucose monitoring:        Glucometer: Freestyle libre   CGM use % of time 38  2-week average/SD  267  Time in range        12 %  % Time Above 180  22  % Time above 250  56  % Time Below 70 0     PRE-MEAL Fasting Lunch Dinner Bedtime Overall  Glucose range:       Averages:  201  360   234  267   POST-MEAL PC Breakfast PC Lunch PC Dinner  Glucose range:     Averages:  292  428 ?      Previous data:  CGM use % of time  54  2-week average/SD   Time in range  92, was 39%  % Time Above 180  6  % Time above 250   % Time Below 70 2     PRE-MEAL Fasting Lunch Dinner Bedtime Overall  Glucose range:       Averages:  106  133  141  129   POST-MEAL PC Breakfast PC Lunch PC Dinner  Glucose range:     Averages:  160  160  158      Dietician visit, most recent: 09/2018 CDE consultation: 07/01/2018  Weight history:  Wt Readings from Last 3 Encounters:  08/18/20 96 lb 6.4 oz (43.7 kg)  05/28/20 103 lb 6.4 oz (46.9 kg)  05/18/20 102 lb (46.3 kg)    Glycemic control:   Lab Results  Component Value Date   HGBA1C 11.0 (H) 08/15/2020   HGBA1C 9.1 (H) 03/29/2020   HGBA1C 8.8 (H) 12/28/2019   Lab Results  Component Value Date   MICROALBUR 22.6 (H) 08/15/2020   LDLCALC 90 08/15/2020   CREATININE 1.51 (H) 08/15/2020   Lab Results  Component Value Date   MICRALBCREAT 17.4 08/15/2020    Lab Results  Component Value Date   FRUCTOSAMINE 518 (H) 08/15/2020   FRUCTOSAMINE 369 (H) 03/29/2020   FRUCTOSAMINE 422 (H) 12/28/2019    Lab on 08/15/2020  Component Date Value Ref Range Status  . Cholesterol 08/15/2020 167  0 - 200 mg/dL Final   ATP III Classification       Desirable:  < 200 mg/dL               Borderline High:  200 - 239 mg/dL          High:  > = 240 mg/dL  . Triglycerides 08/15/2020  91.0  0 - 149 mg/dL Final   Normal:  <150 mg/dLBorderline High:  150 - 199 mg/dL  . HDL 08/15/2020 58.90  >39.00 mg/dL Final  . VLDL 08/15/2020 18.2  0.0 - 40.0 mg/dL Final  . LDL Cholesterol 08/15/2020 90  0 - 99 mg/dL Final  . Total CHOL/HDL Ratio 08/15/2020 3   Final                  Men          Women1/2 Average Risk     3.4          3.3Average Risk          5.0          4.42X Average Risk          9.6          7.13X Average Risk          15.0          11.0                      . NonHDL 08/15/2020 108.47   Final   NOTE:  Non-HDL goal should be 30 mg/dL higher than patient's LDL goal (i.e. LDL goal of < 70 mg/dL, would have non-HDL goal of < 100 mg/dL)  . Microalb, Ur 08/15/2020 22.6* 0.0 - 1.9 mg/dL Final  . Creatinine,U 08/15/2020 130.3  mg/dL Final  . Microalb Creat Ratio 08/15/2020 17.4  0.0 - 30.0 mg/g Final  . Sodium 08/15/2020 138  135 - 145 mEq/L Final  . Potassium 08/15/2020 4.6  3.5 - 5.1 mEq/L Final  . Chloride 08/15/2020 104  96 - 112 mEq/L Final  . CO2 08/15/2020 26  19 - 32 mEq/L Final  . Glucose, Bld 08/15/2020 208* 70 - 99 mg/dL Final  . BUN 08/15/2020 25* 6 - 23 mg/dL Final  . Creatinine, Ser 08/15/2020 1.51* 0.40 - 1.20 mg/dL Final  . Total Bilirubin 08/15/2020 0.7  0.2 - 1.2 mg/dL Final  . Alkaline Phosphatase 08/15/2020  49  39 - 117 U/L Final  . AST 08/15/2020 19  0 - 37 U/L Final  . ALT 08/15/2020 12  0 - 35 U/L Final  . Total Protein 08/15/2020 6.9  6.0 - 8.3 g/dL Final  . Albumin 08/15/2020 4.2  3.5 - 5.2 g/dL Final  . GFR 08/15/2020 34.11* >60.00 mL/min Final  . Calcium 08/15/2020 9.7  8.4 - 10.5 mg/dL Final  . Hgb A1c MFr Bld 08/15/2020 11.0* 4.6 - 6.5 % Final   Glycemic Control Guidelines for People with Diabetes:Non Diabetic:  <6%Goal of Therapy: <7%Additional Action Suggested:  >8%   . Fructosamine 08/15/2020 518* 0 - 285 umol/L Final   Comment: Published reference interval for apparently healthy subjects between age 56 and 39 is 53 - 285 umol/L and in a  poorly controlled diabetic population is 228 - 563 umol/L with a mean of 396 umol/L.     Allergies as of 08/18/2020   No Known Allergies     Medication List       Accurate as of August 18, 2020 11:52 AM. If you have any questions, ask your nurse or doctor.        amLODipine-benazepril 5-20 MG capsule Commonly known as: LOTREL TAKE 1 CAPSULE EVERY DAY   calcium carbonate 1500 (600 Ca) MG Tabs tablet Commonly known as: OSCAL Take 1 tablet by mouth 2 (two) times daily with a meal.   FreeStyle Libre 14 Day Sensor Misc Inject 1 each into the skin every 14 (fourteen) days.   gabapentin 100 MG capsule Commonly known as: NEURONTIN Take 1 capsule (100 mg total) by mouth at bedtime.   glucose blood test strip Use as instructed to test blood sugar 3 times daily E11.65   Lantus SoloStar 100 UNIT/ML Solostar Pen Generic drug: insulin glargine Inject 16 units under the skin once daily.   NovoLOG FlexPen 100 UNIT/ML FlexPen Generic drug: insulin aspart Inject 8 units under the skin before breakfast and 6 units before dinner.   Ozempic (0.25 or 0.5 MG/DOSE) 2 MG/1.5ML Sopn Generic drug: Semaglutide(0.25 or 0.5MG /DOS) Inject 0.5 mg into the skin once a week.   pioglitazone 15 MG tablet Commonly known as: Actos Take 1 tablet (15 mg total) by mouth daily.   rosuvastatin 40 MG tablet Commonly known as: CRESTOR TAKE 1 TABLET EVERY DAY   Travoprost (BAK Free) 0.004 % Soln ophthalmic solution Commonly known as: TRAVATAN Place 1 drop into both eyes at bedtime.       Allergies: No Known Allergies  Past Medical History:  Diagnosis Date  . Diabetes mellitus without complication ([place])   . Glaucoma   . History of chicken pox   . Hypertension     Past Surgical History:  Procedure Laterality Date  . BREAST BIOPSY Right 2017   benign  . CATARACT EXTRACTION, BILATERAL Bilateral   . CESAREAN SECTION    . COLONOSCOPY  1980's  . TUBAL LIGATION      Family History   Problem Relation Age of Onset  . Hypertension Mother   . Diabetes Brother   . CAD Brother   . Hypertension Brother   . Diabetes Brother   . Hypertension Brother   . Colon cancer Neg Hx   . Esophageal cancer Neg Hx   . Pancreatic cancer Neg Hx   . Liver disease Neg Hx   . Stomach cancer Neg Hx   . Rectal cancer Neg Hx   . Colon polyps Neg Hx     Social History:  reports that she has never smoked. She has never used smokeless tobacco. She reports that she does not drink alcohol and does not use drugs.   Review of Systems   Lipid history: Currently on Crestor 40 mg with the following results   Lab Results  Component Value Date   CHOL 167 08/15/2020   HDL 58.90 08/15/2020   LDLCALC 90 08/15/2020   TRIG 91.0 08/15/2020   CHOLHDL 3 08/15/2020           Hypertension: Has been present for several years, on Lotrel for some time Blood pressure tends to be higher in the office   BP Readings from Last 3 Encounters:  08/18/20 (!) 150/82  05/18/20 (!) 140/70  04/01/20 134/68   Renal function appears to be worse again, possibly from hyperglycemia  No history of microalbuminuria  Lab Results  Component Value Date   CREATININE 1.51 (H) 08/15/2020   CREATININE 1.28 (H) 03/29/2020   CREATININE 1.48 (H) 12/28/2019    Most recent eye exam was in 5/19  Most recent foot exam: 9/20  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 Cholesterol 08/15/2020 167  0 - 200 mg/dL Final   ATP III Classification       Desirable:  < 200 mg/dL               Borderline High:  200 - 239 mg/dL          High:  > = 240 mg/dL  . Triglycerides 08/15/2020 91.0  0 - 149 mg/dL Final   Normal:  <150 mg/dLBorderline High:  150 - 199 mg/dL  . HDL 08/15/2020 58.90  >39.00 mg/dL Final  . VLDL 08/15/2020 18.2  0.0 - 40.0 mg/dL Final  . LDL Cholesterol 08/15/2020 90  0 - 99 mg/dL Final  . Total CHOL/HDL Ratio 08/15/2020  3   Final                  Men          Women1/2 Average Risk     3.4          3.3Average Risk          5.0          4.42X Average Risk          9.6          7.13X Average Risk          15.0          11.0                      . NonHDL 08/15/2020 108.47   Final   NOTE:  Non-HDL goal should be 30 mg/dL higher than patient's LDL goal (i.e. LDL goal of < 70 mg/dL, would have non-HDL goal of < 100 mg/dL)  . Microalb, Ur 08/15/2020 22.6* 0.0 - 1.9 mg/dL Final  . Creatinine,U 08/15/2020 130.3  mg/dL Final  . Microalb Creat Ratio 08/15/2020 17.4  0.0 - 30.0 mg/g Final  . Sodium 08/15/2020 138  135 - 145 mEq/L Final  . Potassium 08/15/2020 4.6  3.5 - 5.1 mEq/L Final  . Chloride 08/15/2020 104  96 - 112 mEq/L Final  . CO2 08/15/2020 26  19 - 32 mEq/L Final  . Glucose, Bld 08/15/2020 208* 70 - 99 mg/dL Final  . BUN 08/15/2020 25* 6 - 23 mg/dL Final  .  Creatinine, Ser 08/15/2020 1.51* 0.40 - 1.20 mg/dL Final  . Total Bilirubin 08/15/2020 0.7  0.2 - 1.2 mg/dL Final  . Alkaline Phosphatase 08/15/2020 49  39 - 117 U/L Final  . AST 08/15/2020 19  0 - 37 U/L Final  . ALT 08/15/2020 12  0 - 35 U/L Final  . Total Protein 08/15/2020 6.9  6.0 - 8.3 g/dL Final  . Albumin 08/15/2020 4.2  3.5 - 5.2 g/dL Final  . GFR 08/15/2020 34.11* >60.00 mL/min Final  . Calcium 08/15/2020 9.7  8.4 - 10.5 mg/dL Final  . Hgb A1c MFr Bld 08/15/2020 11.0* 4.6 - 6.5 % Final   Glycemic Control Guidelines for People with Diabetes:Non Diabetic:  <6%Goal of Therapy: <7%Additional Action Suggested:  >8%   . Fructosamine 08/15/2020 518* 0 - 285 umol/L Final   Comment: Published reference interval for apparently healthy subjects between age 23 and 4 is 56 - 285 umol/L and in a poorly controlled diabetic population is 228 - 563 umol/L with a mean of 396 umol/L.     Physical Examination:  BP (!) 150/82   Pulse 76   Resp (!) 99   Ht 4\' 11"  (1.499 m)   Wt 96 lb 6.4 oz (43.7 kg)   BMI 19.47 kg/m         ASSESSMENT:  Diabetes type 2, insulin-dependent    A1c is now 11%  See history of present illness for discussion of current diabetes management, blood sugar patterns and problems identified  She is having much higher blood sugars with her not taking Ozempic in addition to basal bolus insulin regimen and Actos She is not clearly symptomatic from the higher sugars but has lost 7 pounds  Without the Ozempic her fasting readings are also significantly higher As before she is checking very infrequently with her freestyle libre and data available only 38% of the time for the last 2 weeks  Blood sugars are higher despite her reporting fairly good diet  Renal dysfunction: Likely worse from mild dehydration related to hyperglycemia  Lipids: Well-controlled and she will continue same dose of 40 mg Crestor  PLAN:   She will use the coupon provided to start back on 0.25 mg weekly of Ozempic After 2 weeks can try 0.5 dosage for better efficacy She needs to add protein at breakfast and given ideas to implement This may improve her sugars postprandially in the mornings also She was given separate instructions for increasing her insulin doses at mealtimes until her daytime sugars come down below 200 again This will be 14 units NovoLog at breakfast and 10 at lunch Again described the need for checking blood sugars at bedtime to enable [place] to decide if she needs some NovoLog in the evening for a snack  Again patient assistance form was filled out for her To avoid any drinks with sugar  Patient Instructions  MUST CHECK SUGAR 4 X DAILY  NOVOLOG 14 IN AM AND 10 AT LUNCH  LANTUS 18 units in am  When restarting Ozempic use 0.25 dose 2x then 0.5 weekly  WHEN SUGARS ARE BELOW 200 all the time then go back to: 16 lantus in am' Novolog 8 in am and 6 at lunch     Elayne Snare 08/18/2020, 11:52 AM   Note: This office note was prepared with Dragon voice recognition system technology. Any  transcriptional errors that result from this process are unintentional.

## 2020-08-18 NOTE — Patient Instructions (Signed)
MUST CHECK SUGAR 4 X DAILY  NOVOLOG 14 IN AM AND 10 AT LUNCH  LANTUS 18 units in am  When restarting Ozempic use 0.25 dose 2x then 0.5 weekly  WHEN SUGARS ARE BELOW 200 all the time then go back to: 16 lantus in am' Novolog 8 in am and 6 at lunch

## 2020-08-18 NOTE — Addendum Note (Signed)
Addended by: Roxanna Mew on: 08/18/2020 12:01 PM   Modules accepted: Orders

## 2020-09-03 ENCOUNTER — Ambulatory Visit: Payer: Medicare Other | Attending: Internal Medicine

## 2020-09-03 DIAGNOSIS — Z23 Encounter for immunization: Secondary | ICD-10-CM

## 2020-09-03 NOTE — Progress Notes (Signed)
° °  Covid-19 Vaccination Clinic  Name:  Mariah Peterson    MRN: [id number] DOB: 11-24-1945  09/03/2020  [name] was observed post Covid-19 immunization for 15 minutes without incident. She was provided with Vaccine Information Sheet and instruction to access the V-Safe system.   [name] was instructed to call 911 with any severe reactions post vaccine:  Difficulty breathing   Swelling of face and throat   A fast heartbeat   A bad rash all over body   Dizziness and weakness   Immunizations Administered    Name Date Dose VIS Date Route   Pfizer COVID-19 Vaccine 09/03/2020  4:10 PM 0.3 mL 08/10/2020 Intramuscular   Manufacturer: [place]   Lot: [id number]   [place]: [id number]

## 2020-09-22 ENCOUNTER — Other Ambulatory Visit: Payer: Self-pay | Admitting: Endocrinology

## 2020-09-23 ENCOUNTER — Other Ambulatory Visit: Payer: Self-pay

## 2020-09-23 ENCOUNTER — Other Ambulatory Visit (INDEPENDENT_AMBULATORY_CARE_PROVIDER_SITE_OTHER): Payer: Medicare Other

## 2020-09-23 DIAGNOSIS — Z794 Long term (current) use of insulin: Secondary | ICD-10-CM

## 2020-09-23 DIAGNOSIS — E1165 Type 2 diabetes mellitus with hyperglycemia: Secondary | ICD-10-CM

## 2020-09-23 LAB — BASIC METABOLIC PANEL
BUN: 26 mg/dL — ABNORMAL HIGH (ref 6–23)
CO2: 26 mEq/L (ref 19–32)
Calcium: 10.3 mg/dL (ref 8.4–10.5)
Chloride: 104 mEq/L (ref 96–112)
Creatinine, Ser: 1.62 mg/dL — ABNORMAL HIGH (ref 0.40–1.20)
GFR: 31.33 mL/min — ABNORMAL LOW (ref >60.00)
Glucose, Bld: 229 mg/dL — ABNORMAL HIGH (ref 70–99)
Potassium: 4.1 mEq/L (ref 3.5–5.1)
Sodium: 139 mEq/L (ref 135–145)

## 2020-09-24 LAB — FRUCTOSAMINE: Fructosamine: 484 umol/L — ABNORMAL HIGH (ref 0–285)

## 2020-09-28 ENCOUNTER — Other Ambulatory Visit: Payer: Self-pay

## 2020-09-28 ENCOUNTER — Ambulatory Visit (INDEPENDENT_AMBULATORY_CARE_PROVIDER_SITE_OTHER): Payer: Medicare Other | Admitting: Endocrinology

## 2020-09-28 ENCOUNTER — Encounter: Payer: Self-pay | Admitting: Endocrinology

## 2020-09-28 VITALS — BP 152/78 | HR 80 | Ht <= 58 in | Wt 92.4 lb

## 2020-09-28 DIAGNOSIS — Z794 Long term (current) use of insulin: Secondary | ICD-10-CM

## 2020-09-28 DIAGNOSIS — E1165 Type 2 diabetes mellitus with hyperglycemia: Secondary | ICD-10-CM | POA: Diagnosis not present

## 2020-09-28 DIAGNOSIS — I10 Essential (primary) hypertension: Secondary | ICD-10-CM

## 2020-09-28 DIAGNOSIS — N183 Chronic kidney disease, stage 3 unspecified: Secondary | ICD-10-CM

## 2020-09-28 MED ORDER — HYDROCHLOROTHIAZIDE 12.5 MG PO CAPS: 12.5000 mg | ORAL_CAPSULE | Freq: Every day | ORAL | 1 refills | Status: DC

## 2020-09-28 MED ORDER — AMLODIPINE BESYLATE 10 MG PO TABS: 10.0000 mg | ORAL_TABLET | Freq: Every day | ORAL | 3 refills | Status: DC

## 2020-09-28 NOTE — Patient Instructions (Addendum)
Check sugar 4x daily  Novolog/orange pen 12 units at bfst and 10 at lunch  Lantus 16 units daily  Call if sugars get below 80   Check BP at home

## 2020-09-28 NOTE — Progress Notes (Signed)
Patient ID: Mariah Peterson, female   DOB: Mar 30, 1946, 74 y.o.   MRN: [id number]          Reason for Appointment: Follow-up for Type 2 Diabetes   History of Present Illness:          Date of diagnosis of type 2 diabetes mellitus:?  1990       Background history:   She was started on insulin about 4 years ago and previously taking metformin and glipizide No detailed history is available and she does not think she has taken any other types of diabetes medications or injections  Recent history:    INSULIN regimen is: Lantus 18 units daily in am, Novolog 8 units, 6 ac lunch    Non-insulin hypoglycemic drugs the patient is taking are: Actos 15 mg daily, Ozempic 0.5 mg weekly,  Current management, blood sugar patterns and problems identified:  Her A1c is last higher at 11 compared to 9.1 Fructosamine is 484, previously slightly higher   She is taking Ozempic as directed since her last visit 6 weeks ago  However she was first started on 0.25 mg for a couple of weeks  She thinks she is taking this regularly and has not had any nausea  As before she is only with regular with checking her blood sugars in the mornings and midday and data from her sensor is unavailable in the evenings usually  Blood sugars overnight are recently improving but she still has significant postprandial hyperglycemia  She is reportedly consistent with her insulin doses as prescribed  Evening meal is usually skipped and only has some crackers with cheese or peanut butter  No hypoglycemia but her blood sugars are low normal fasting this morning  Also she did not increase her NovoLog as directed on the last visit  Interpretation of her CGM data is as follows Glucose sensor: Freestyle libre    Blood sugars are showing marked variability especially overnight  Generally her blood sugars are progressively rising from early morning till late afternoon and then decreasing down by evening  Blood sugar  information is incomplete between 6 PM-midnight from lack of monitoring  She has blood sugars over 350 frequently in the afternoon or evening as early as 11 AM and generally looking better in the last 3 to 4 days  Postprandial rise in blood sugar is usually modest since baseline sugars are usually high premeal  CGM use % of time  48  2-week average/SD  135, GV 37  Time in range  29       %  % Time Above 180  29  % Time above 250  42  % Time Below 70 0     PRE-MEAL Fasting Lunch Dinner  8-10 PM Overall  Glucose range:       Averages:  196   243  109  235   POST-MEAL PC Breakfast PC Lunch PC Dinner  Glucose range:     Averages:  280  306      She was seen by diabetes educator in 9/19       Side effects from medications have been: None  Compliance with the medical regimen: Fair  Typical meal intake: Breakfast is oatmeal, otherwise eggs and toast.  Lunch may be rice and beans or a sandwich at 4 pm Fruit for snacks               Exercise:  During the days is active with her grandson, not much formal  exercise  Previous data:  CGM use % of time 38  2-week average/SD  267  Time in range        12 %  % Time Above 180  22  % Time above 250  56  % Time Below 70 0     PRE-MEAL Fasting Lunch Dinner Bedtime Overall  Glucose range:       Averages:  201  360   234  267   POST-MEAL PC Breakfast PC Lunch PC Dinner  Glucose range:     Averages:  292  428 ?      Dietician visit, most recent: 09/2018 CDE consultation: 07/01/2018  Weight history:  Wt Readings from Last 3 Encounters:  09/28/20 92 lb 6.4 oz (41.9 kg)  08/18/20 96 lb 6.4 oz (43.7 kg)  05/28/20 103 lb 6.4 oz (46.9 kg)    Glycemic control:   Lab Results  Component Value Date   HGBA1C 11.0 (H) 08/15/2020   HGBA1C 9.1 (H) 03/29/2020   HGBA1C 8.8 (H) 12/28/2019   Lab Results  Component Value Date   MICROALBUR 22.6 (H) 08/15/2020   LDLCALC 90 08/15/2020   CREATININE 1.62 (H) 09/23/2020   Lab Results   Component Value Date   MICRALBCREAT 17.4 08/15/2020    Lab Results  Component Value Date   FRUCTOSAMINE 484 (H) 09/23/2020   FRUCTOSAMINE 518 (H) 08/15/2020   FRUCTOSAMINE 369 (H) 03/29/2020    Lab on 09/23/2020  Component Date Value Ref Range Status  . Sodium 09/23/2020 139  135 - 145 mEq/L Final  . Potassium 09/23/2020 4.1  3.5 - 5.1 mEq/L Final  . Chloride 09/23/2020 104  96 - 112 mEq/L Final  . CO2 09/23/2020 26  19 - 32 mEq/L Final  . Glucose, Bld 09/23/2020 229* 70 - 99 mg/dL Final  . BUN 09/23/2020 26* 6 - 23 mg/dL Final  . Creatinine, Ser 09/23/2020 1.62* 0.40 - 1.20 mg/dL Final  . GFR 09/23/2020 31.33* >60.00 mL/min Final   Calculated using the CKD-EPI Creatinine Equation (2021)  . Calcium 09/23/2020 10.3  8.4 - 10.5 mg/dL Final  . Fructosamine 09/23/2020 484* 0 - 285 umol/L Final   Comment: Published reference interval for apparently healthy subjects between age 45 and 24 is 22 - 285 umol/L and in a poorly controlled diabetic population is 228 - 563 umol/L with a mean of 396 umol/L.     Allergies as of 09/28/2020   No Known Allergies     Medication List       Accurate as of September 28, 2020  3:30 PM. If you have any questions, ask your nurse or doctor.        STOP taking these medications   amLODipine-benazepril 5-20 MG capsule Commonly known as: LOTREL Stopped by: Elayne Snare, MD     TAKE these medications   amLODipine 10 MG tablet Commonly known as: NORVASC Take 1 tablet (10 mg total) by mouth daily. Started by: Elayne Snare, MD   calcium carbonate 1500 (600 Ca) MG Tabs tablet Commonly known as: OSCAL Take 1 tablet by mouth 2 (two) times daily with a meal.   FreeStyle Libre 14 Day Sensor Misc Inject 1 each into the skin every 14 (fourteen) days.   gabapentin 100 MG capsule Commonly known as: NEURONTIN Take 1 capsule (100 mg total) by mouth at bedtime.   glucose blood test strip Use as instructed to test blood sugar 3 times daily E11.65    hydrochlorothiazide 12.5 MG capsule Commonly known  as: MICROZIDE Take 1 capsule (12.5 mg total) by mouth daily. Started by: Elayne Snare, MD   Lantus SoloStar 100 UNIT/ML Solostar Pen Generic drug: insulin glargine INJECT 16 UNITS UNDER THE SKIN ONCE DAILY. (UPDATED DOSAGE)   NovoLOG FlexPen 100 UNIT/ML FlexPen Generic drug: insulin aspart Inject 8 units under the skin before breakfast and 6 units before dinner.   Ozempic (0.25 or 0.5 MG/DOSE) 2 MG/1.5ML Sopn Generic drug: Semaglutide(0.25 or 0.5MG /DOS) Inject 0.5 mg into the skin once a week.   pioglitazone 15 MG tablet Commonly known as: Actos Take 1 tablet (15 mg total) by mouth daily.   rosuvastatin 40 MG tablet Commonly known as: CRESTOR TAKE 1 TABLET EVERY DAY   Travoprost (BAK Free) 0.004 % Soln ophthalmic solution Commonly known as: TRAVATAN Place 1 drop into both eyes at bedtime.       Allergies: No Known Allergies  Past Medical History:  Diagnosis Date  . Diabetes mellitus without complication ([place])   . Glaucoma   . History of chicken pox   . Hypertension     Past Surgical History:  Procedure Laterality Date  . BREAST BIOPSY Right 2017   benign  . CATARACT EXTRACTION, BILATERAL Bilateral   . CESAREAN SECTION    . COLONOSCOPY  1980's  . TUBAL LIGATION      Family History  Problem Relation Age of Onset  . Hypertension Mother   . Diabetes Brother   . CAD Brother   . Hypertension Brother   . Diabetes Brother   . Hypertension Brother   . Colon cancer Neg Hx   . Esophageal cancer Neg Hx   . Pancreatic cancer Neg Hx   . Liver disease Neg Hx   . Stomach cancer Neg Hx   . Rectal cancer Neg Hx   . Colon polyps Neg Hx     Social History:  reports that she has never smoked. She has never used smokeless tobacco. She reports that she does not drink alcohol and does not use drugs.   Review of Systems   Lipid history: Currently on Crestor 40 mg with the following results   Lab Results   Component Value Date   CHOL 167 08/15/2020   HDL 58.90 08/15/2020   LDLCALC 90 08/15/2020   TRIG 91.0 08/15/2020   CHOLHDL 3 08/15/2020           Hypertension: Has been present for several years, on Lotrel for some time Blood pressure tends to be higher in the office but she does not check at home, she still thinks she has whitecoat syndrome   BP Readings from Last 3 Encounters:  09/28/20 (!) 152/78  08/18/20 (!) 150/82  05/18/20 (!) 140/70   Renal function appears to be worse again  No history of microalbuminuria  Lab Results  Component Value Date   CREATININE 1.62 (H) 09/23/2020   CREATININE 1.51 (H) 08/15/2020   CREATININE 1.28 (H) 03/29/2020    Most recent eye exam was in 5/19  Most recent foot exam: 9/20  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  LABS:  Lab on 09/23/2020  Component Date Value Ref Range Status  . Sodium 09/23/2020 139  135 - 145 mEq/L Final  . Potassium 09/23/2020 4.1  3.5 - 5.1 mEq/L Final  . Chloride 09/23/2020 104  96 - 112 mEq/L Final  . CO2 09/23/2020 26  19 - 32 mEq/L Final  . Glucose, Bld 09/23/2020 229* 70 - 99 mg/dL Final  . BUN 09/23/2020 26* 6 -  23 mg/dL Final  . Creatinine, Ser 09/23/2020 1.62* 0.40 - 1.20 mg/dL Final  . GFR 09/23/2020 31.33* >60.00 mL/min Final   Calculated using the CKD-EPI Creatinine Equation (2021)  . Calcium 09/23/2020 10.3  8.4 - 10.5 mg/dL Final  . Fructosamine 09/23/2020 484* 0 - 285 umol/L Final   Comment: Published reference interval for apparently healthy subjects between age 48 and 50 is 9 - 285 umol/L and in a poorly controlled diabetic population is 228 - 563 umol/L with a mean of 396 umol/L.     Physical Examination:  BP (!) 152/78   Pulse 80   Ht 4\' 8"  (1.422 m)   Wt 92 lb 6.4 oz (41.9 kg)   SpO2 99%   BMI 20.72 kg/m        ASSESSMENT:  Diabetes type 2, insulin-dependent    A1c is last 11%  See history of present illness for discussion of current diabetes  management, blood sugar patterns and problems identified  Fructosamine of 484 still indicates marked hyperglycemia  She is on basal bolus insulin and Ozempic  She again has significant postprandial hyperglycemia As before her blood sugars appear to be improving with starting Ozempic about 6 weeks ago and now is having better overnight blood sugars Current dose of NovoLog does not appear to control her postprandial readings Also mostly eating 2 meals a day  She is continuing to lose weight likely from hyperglycemia As before she is checking only once or twice a day and 48% of data is available for the last 2 weeks for review Her diet is usually fairly good  Renal dysfunction: Likely worse from mild dehydration but may be also related to ACE inhibitor  Hypertension: Not well controlled  PLAN:   She will call the patient assistance program to see if her application is being processed We will give her a sample of Ozempic for now She will continue 0.5 mg weekly To reduce Lantus to 16 units to potentially reduce overnight hypoglycemia NovoLog will be increased to 12 units at breakfast and 10 at lunch  She will start checking blood pressure at home  Stop Lotrel and start amlodipine 10 mg and HCTZ 12.5 mg  Patient Instructions  Check sugar 4x daily  Novolog/orange pen 12 units at bfst and 10 at lunch  Lantus 16 units daily  Call if sugars get below 80   Check BP at home     Elayne Snare 09/28/2020, 3:30 PM   Note: This office note was prepared with Dragon voice recognition system technology. Any transcriptional errors that result from this process are unintentional.

## 2020-09-30 ENCOUNTER — Ambulatory Visit: Payer: Medicare Other | Admitting: Family

## 2020-10-10 ENCOUNTER — Other Ambulatory Visit: Payer: Self-pay

## 2020-10-10 ENCOUNTER — Ambulatory Visit (INDEPENDENT_AMBULATORY_CARE_PROVIDER_SITE_OTHER): Payer: Medicare Other | Admitting: Family

## 2020-10-10 ENCOUNTER — Encounter: Payer: Self-pay | Admitting: Family

## 2020-10-10 VITALS — BP 140/72 | HR 109 | Temp 98.4°F | Ht <= 58 in | Wt 88.0 lb

## 2020-10-10 DIAGNOSIS — R634 Abnormal weight loss: Secondary | ICD-10-CM | POA: Diagnosis not present

## 2020-10-10 LAB — CBC WITH DIFFERENTIAL/PLATELET
Basophils Absolute: 0 10*3/uL (ref 0.0–0.1)
Basophils Relative: 0.6 % (ref 0.0–3.0)
Eosinophils Absolute: 0.1 10*3/uL (ref 0.0–0.7)
Eosinophils Relative: 1.6 % (ref 0.0–5.0)
HCT: 35.9 % — ABNORMAL LOW (ref 36.0–46.0)
Hemoglobin: 12.1 g/dL (ref 12.0–15.0)
Lymphocytes Relative: 29.2 % (ref 12.0–46.0)
Lymphs Abs: 1.6 10*3/uL (ref 0.7–4.0)
MCHC: 33.7 g/dL (ref 30.0–36.0)
MCV: 88.1 fl (ref 78.0–100.0)
Monocytes Absolute: 0.4 10*3/uL (ref 0.1–1.0)
Monocytes Relative: 7.3 % (ref 3.0–12.0)
Neutro Abs: 3.3 10*3/uL (ref 1.4–7.7)
Neutrophils Relative %: 61.3 % (ref 43.0–77.0)
Platelets: 231 10*3/uL (ref 150.0–400.0)
RBC: 4.07 Mil/uL (ref 3.87–5.11)
RDW: 13.6 % (ref 11.5–15.5)
WBC: 5.4 10*3/uL (ref 4.0–10.5)

## 2020-10-10 LAB — COMPREHENSIVE METABOLIC PANEL
ALT: 16 U/L (ref 0–35)
AST: 20 U/L (ref 0–37)
Albumin: 4.5 g/dL (ref 3.5–5.2)
Alkaline Phosphatase: 52 U/L (ref 39–117)
BUN: 38 mg/dL — ABNORMAL HIGH (ref 6–23)
CO2: 28 mEq/L (ref 19–32)
Calcium: 10.8 mg/dL — ABNORMAL HIGH (ref 8.4–10.5)
Chloride: 95 mEq/L — ABNORMAL LOW (ref 96–112)
Creatinine, Ser: 1.88 mg/dL — ABNORMAL HIGH (ref 0.40–1.20)
GFR: 26.19 mL/min — ABNORMAL LOW (ref >60.00)
Glucose, Bld: 398 mg/dL — ABNORMAL HIGH (ref 70–99)
Potassium: 3.9 mEq/L (ref 3.5–5.1)
Sodium: 133 mEq/L — ABNORMAL LOW (ref 135–145)
Total Bilirubin: 1.1 mg/dL (ref 0.2–1.2)
Total Protein: 7.9 g/dL (ref 6.0–8.3)

## 2020-10-10 LAB — TSH: TSH: 2.08 u[IU]/mL (ref 0.35–4.50)

## 2020-10-10 NOTE — Progress Notes (Signed)
Mariah Peterson is a 74 y.o. female with the following history as recorded in [employer]:  Patient Active Problem List   Diagnosis Date Noted  . Glaucoma 03/31/2019  . Healthcare maintenance 12/03/2018  . Anemia 12/03/2018  . Hypertension 10/31/2018  . Hyperlipidemia 08/04/2013  . Diabetes mellitus ([place]) 08/04/2013    Current Outpatient Medications  Medication Sig Dispense Refill  . amLODipine (NORVASC) 10 MG tablet Take 1 tablet (10 mg total) by mouth daily. 30 tablet 3  . calcium carbonate (OSCAL) 1500 (600 Ca) MG TABS tablet Take 1 tablet by mouth 2 (two) times daily with a meal.    . Continuous Blood Gluc Sensor (FREESTYLE LIBRE 14 DAY SENSOR) MISC Inject 1 each into the skin every 14 (fourteen) days. 2 each 5  . glucose blood test strip Use as instructed to test blood sugar 3 times daily E11.65 270 each 1  . hydrochlorothiazide (MICROZIDE) 12.5 MG capsule Take 1 capsule (12.5 mg total) by mouth daily. 30 capsule 1  . insulin aspart (NOVOLOG FLEXPEN) 100 UNIT/ML FlexPen Inject 8 units under the skin before breakfast and 6 units before dinner. 15 mL 3  . insulin glargine (LANTUS SOLOSTAR) 100 UNIT/ML Solostar Pen INJECT 16 UNITS UNDER THE SKIN ONCE DAILY. (UPDATED DOSAGE) 15 mL 1  . pioglitazone (ACTOS) 15 MG tablet Take 1 tablet (15 mg total) by mouth daily. 90 tablet 1  . rosuvastatin (CRESTOR) 40 MG tablet TAKE 1 TABLET EVERY DAY 90 tablet 1  . Semaglutide,0.25 or 0.5MG/DOS, (OZEMPIC, 0.25 OR 0.5 MG/DOSE,) 2 MG/1.5ML SOPN Inject 0.5 mg into the skin once a week. 1.5 mL 1  . Travoprost, BAK Free, (TRAVATAN) 0.004 % SOLN ophthalmic solution Place 1 drop into both eyes at bedtime. 2.5 mL 1   No current facility-administered medications for this visit.    Allergies: Patient has no known allergies.  Past Medical History:  Diagnosis Date  . Diabetes mellitus without complication ([place])   . Glaucoma   . History of chicken pox   . Hypertension     Past Surgical History:  Procedure  Laterality Date  . BREAST BIOPSY Right 2017   benign  . CATARACT EXTRACTION, BILATERAL Bilateral   . CESAREAN SECTION    . COLONOSCOPY  1980's  . TUBAL LIGATION      Family History  Problem Relation Age of Onset  . Hypertension Mother   . Diabetes Brother   . CAD Brother   . Hypertension Brother   . Diabetes Brother   . Hypertension Brother   . Colon cancer Neg Hx   . Esophageal cancer Neg Hx   . Pancreatic cancer Neg Hx   . Liver disease Neg Hx   . Stomach cancer Neg Hx   . Rectal cancer Neg Hx   . Colon polyps Neg Hx     Social History   Tobacco Use  . Smoking status: Never Smoker  . Smokeless tobacco: Never Used  Substance Use Topics  . Alcohol use: No    Subjective:  Presents for yearly follow-up; majority of her care is managed by her endocrinologist; appears to have lost 15 pounds since the end of August- per patient, "she feels great." No fever, night sweats or nausea; is taking Ozempic weekly; Of note, she wants to get her mammogram every other year;  Health Maintenance  Topic Date Due  . OPHTHALMOLOGY EXAM  09/30/2019  . FOOT EXAM  09/29/2020  . HEMOGLOBIN A1C  02/13/2021  . MAMMOGRAM  04/08/2021  .  URINE MICROALBUMIN  08/15/2021  . TETANUS/TDAP  12/03/2028  . COLONOSCOPY  05/14/2029  . INFLUENZA VACCINE  Completed  . DEXA SCAN  Completed  . COVID-19 Vaccine  Completed  . Hepatitis C Screening  Completed  . PNA vac Low Risk Adult  Completed    Review of Systems  Constitutional: Positive for weight loss.  HENT: Negative.   Eyes: Negative.   Respiratory: Negative.   Cardiovascular: Negative.   Gastrointestinal: Negative.   Genitourinary: Negative.   Musculoskeletal: Negative.   Skin: Negative.   Neurological: Negative.   Endo/Heme/Allergies: Negative.   Psychiatric/Behavioral: Negative.      Objective:  Vitals:   10/10/20 1038  BP: 140/72  Pulse: (!) 109  Temp: 98.4 F (36.9 C)  TempSrc: Oral  SpO2: 98%  Weight: 88 lb (39.9 kg)   Height: _0  (1.422 m)    General: Well developed, well nourished, in no acute distress  Skin : Warm and dry.  Head: Normocephalic and atraumatic  Eyes: Sclera and conjunctiva clear; pupils round and reactive to light; extraocular movements intact  Ears: External normal; canals clear; tympanic membranes normal  Oropharynx: Pink, supple. No suspicious lesions  Neck: Supple without thyromegaly, adenopathy  Lungs: Respirations unlabored; clear to auscultation bilaterally without wheeze, rales, rhonchi  CVS exam: normal rate and regular rhythm.  Abdomen: Soft; nontender; nondistended; normoactive bowel sounds; no masses or hepatosplenomegaly  Musculoskeletal: No deformities; no active joint inflammation  Extremities: No edema, cyanosis, clubbing  Vessels: Symmetric bilaterally  Neurologic: Alert and oriented; speech intact; face symmetrical; moves all extremities well; CNII-XII intact without focal deficit   Assessment:  1. Weight loss     Plan:  ? Reaction to Ozempic- I will reach out to her endocrinologist as well; patient is also instructed to take her medications to next appt with endocrine- she is not sure if she is taking Actos; discussed updating CXR today and she defers; will check CBC, CMP, TSH today; encouraged Glucerna shakes; follow-up to be determined;  Discussed Shingrix and she will consider/ understands it needs to be done at pharmacy per Medicare guidelines;  Spent 30+ minutes with patient  This visit occurred during the SARS-CoV-2 public health emergency.  Safety protocols were in place, including screening questions prior to the visit, additional usage of staff PPE, and extensive cleaning of exam room while observing appropriate contact time as indicated for disinfecting solutions.     No follow-ups on file.  Orders Placed This Encounter  Procedures  . CBC with Differential/Platelet    Standing Status:   Future    Number of Occurrences:   1    Standing  Expiration Date:   10/10/2021  . Comp Met (CMET)    Standing Status:   Future    Number of Occurrences:   1    Standing Expiration Date:   10/10/2021  . TSH    Standing Status:   Future    Number of Occurrences:   1    Standing Expiration Date:   10/10/2021    Requested Prescriptions    No prescriptions requested or ordered in this encounter

## 2020-10-12 ENCOUNTER — Other Ambulatory Visit: Payer: Self-pay | Admitting: Endocrinology

## 2020-10-19 ENCOUNTER — Ambulatory Visit (INDEPENDENT_AMBULATORY_CARE_PROVIDER_SITE_OTHER): Payer: Medicare Other

## 2020-10-19 DIAGNOSIS — Z0000 Encounter for general adult medical examination without abnormal findings: Secondary | ICD-10-CM

## 2020-10-19 NOTE — Progress Notes (Addendum)
I connected with [name] today by telephone and verified that I am speaking with the correct person using two identifiers. Location patient: home Location provider: work Persons participating in the virtual visit: Teniqua Roylance and [employer], LPN.  I discussed the limitations, risks, security and privacy concerns of performing an evaluation and management service by telephone and the availability of in person appointments. I also discussed with the patient that there may be a patient responsible charge related to this service. The patient expressed understanding and verbally consented to this telephonic visit.    Interactive audio and video telecommunications were attempted between this provider and patient, however failed, due to patient having technical difficulties OR patient did not have access to video capability.  We continued and completed visit with audio only.  Some vital signs may be absent or patient reported.   Time Spent with patient on telephone encounter: 30 minutes  Subjective:   Mariah Peterson is a 74 y.o. female who presents for Medicare Annual (Subsequent) preventive examination.  Review of Systems    No ROS. Medicare Wellness Visit. Additional risk factors are reflected in social history. Cardiac Risk Factors include: advanced age (>66men, >19 women);diabetes mellitus;dyslipidemia;family history of premature cardiovascular disease;hypertension     Objective:    There were no vitals filed for this visit. There is no height or weight on file to calculate BMI.  Advanced Directives 10/19/2020 09/30/2019 05/15/2019 09/30/2018 06/26/2018  Does Patient Have a Medical Advance Directive? No No No Yes Yes  Would patient like information on creating a medical advance directive? No - Patient declined Yes (ED - Information included in AVS) - - -    Current Medications (verified) Outpatient Encounter Medications as of 10/19/2020  Medication Sig   amLODipine (NORVASC)  10 MG tablet Take 1 tablet (10 mg total) by mouth daily.   calcium carbonate (OSCAL) 1500 (600 Ca) MG TABS tablet Take 1 tablet by mouth 2 (two) times daily with a meal.   Continuous Blood Gluc Sensor (FREESTYLE LIBRE 14 DAY SENSOR) MISC Inject 1 each into the skin every 14 (fourteen) days.   glucose blood test strip Use as instructed to test blood sugar 3 times daily E11.65   hydrochlorothiazide (MICROZIDE) 12.5 MG capsule Take 1 capsule (12.5 mg total) by mouth daily.   insulin glargine (LANTUS SOLOSTAR) 100 UNIT/ML Solostar Pen INJECT 16 UNITS UNDER THE SKIN ONCE DAILY. (UPDATED DOSAGE)   NOVOLOG FLEXPEN 100 UNIT/ML FlexPen INJECT 8 UNITS UNDER THE SKIN BEFORE BREAKFAST AND 6 UNITS BEFORE DINNER.   pioglitazone (ACTOS) 15 MG tablet Take 1 tablet (15 mg total) by mouth daily.   rosuvastatin (CRESTOR) 40 MG tablet TAKE 1 TABLET EVERY DAY   Semaglutide,0.25 or 0.5MG /DOS, (OZEMPIC, 0.25 OR 0.5 MG/DOSE,) 2 MG/1.5ML SOPN Inject 0.5 mg into the skin once a week.   Travoprost, BAK Free, (TRAVATAN) 0.004 % SOLN ophthalmic solution Place 1 drop into both eyes at bedtime.   No facility-administered encounter medications on file as of 10/19/2020.    Allergies (verified) Patient has no known allergies.   History: Past Medical History:  Diagnosis Date   Diabetes mellitus without complication ([place])    Glaucoma    History of chicken pox    Hypertension    Past Surgical History:  Procedure Laterality Date   BREAST BIOPSY Right 2017   benign   CATARACT EXTRACTION, BILATERAL Bilateral    CESAREAN SECTION     COLONOSCOPY  1980's   TUBAL LIGATION  Family History  Problem Relation Age of Onset   Hypertension Mother    Diabetes Brother    CAD Brother    Hypertension Brother    Diabetes Brother    Hypertension Brother    Colon cancer Neg Hx    Esophageal cancer Neg Hx    Pancreatic cancer Neg Hx    Liver disease Neg Hx    Stomach cancer Neg Hx    Rectal cancer Neg Hx    Colon polyps  Neg Hx    Social History   Socioeconomic History   Marital status: Married    Spouse name: Not on file   Number of children: 1   Years of education: Not on file   Highest education level: Not on file  Occupational History   Occupation: Retired  Tobacco Use   Smoking status: Never Smoker   Smokeless tobacco: Never Used  Scientific laboratory technician Use: Never used  Substance and Sexual Activity   Alcohol use: No   Drug use: No   Sexual activity: Yes  Other Topics Concern   Not on file  Social History Narrative   Not on file   Social Determinants of Health   Financial Resource Strain: Low Risk    Difficulty of Paying Living Expenses: Not hard at all  Food Insecurity: No Food Insecurity   Worried About Charity fundraiser in the Last Year: Never true   Ran Out of Food in the Last Year: Never true  Transportation Needs: No Transportation Needs   Lack of Transportation (Medical): No   Lack of Transportation (Non-Medical): No  Physical Activity: Sufficiently Active   Days of Exercise per Week: 5 days   Minutes of Exercise per Session: 30 min  Stress: No Stress Concern Present   Feeling of Stress : Not at all  Social Connections: Socially Integrated   Frequency of Communication with Friends and Family: More than three times a week   Frequency of Social Gatherings with Friends and Family: More than three times a week   Attends Religious Services: More than 4 times per year   Active Member of [employer] or Organizations: Yes   Attends Music therapist: More than 4 times per year   Marital Status: Married    Tobacco Counseling Counseling given: Not Answered   Clinical Intake:  Pre-visit preparation completed: Yes  Pain : No/denies pain     Nutritional Risks: Unintentional weight loss Diabetes: Yes CBG done?: No Did pt. bring in CBG monitor from home?: No  How often do you need to have someone help you when you read instructions, pamphlets, or other written  materials from your doctor or pharmacy?: 1 - Never What is the last grade level you completed in school?: High School Graduate  Diabetic? yes  Interpreter Needed?: No  Information entered by :: Lisette Abu, LPN   Activities of Daily Living In your present state of health, do you have any difficulty performing the following activities: 10/19/2020 10/10/2020  Hearing? N N  Vision? N N  Difficulty concentrating or making decisions? N N  Walking or climbing stairs? N N  Dressing or bathing? N N  Doing errands, shopping? N N  Preparing Food and eating ? N -  Using the Toilet? N -  In the past six months, have you accidently leaked urine? N -  Do you have problems with loss of bowel control? N -  Managing your Medications? N -  Managing your Finances? N -  Housekeeping or managing your Housekeeping? N -  Some recent data might be hidden    Patient Care Team: Olive Bass, FNP as PCP - General (Internal Medicine) Reather Littler, MD as Consulting Physician (Endocrinology) Hanley Seamen Dustin Folks, MD as Referring Physician (Optometry) Danis, Andreas Blower, MD as Consulting Physician (Gastroenterology)  Indicate any recent Medical Services you may have received from other than [facility] providers in the past year (date may be approximate).     Assessment:   This is a routine wellness examination for Chirsty.  Hearing/Vision screen No exam data present  Dietary issues and exercise activities discussed: Current Exercise Habits: Home exercise routine, Type of exercise: walking, Time (Minutes): 30, Frequency (Times/Week): 5, Weekly Exercise (Minutes/Week): 150, Intensity: Moderate  Goals       Patient Stated      Monitor my diet to keep my diabetes under control.      Patient Stated (pt-stated)      To maintain my current health status by continuing to eat healthy, stay physically active and socially active.       Depression Screen PHQ 2/9 Scores 10/19/2020 10/10/2020  09/30/2019 03/31/2019 09/30/2018 04/13/2016  PHQ - 2 Score 0 0 0 0 0 0  PHQ- 9 Score - - 3 - - -    Fall Risk Fall Risk  10/19/2020 10/10/2020 09/30/2019 09/30/2018 04/13/2016  Falls in the past year? 0 0 0 0 No  Number falls in past yr: 0 0 0 - -  Injury with Fall? 0 0 0 - -  Risk for fall due to : No Fall Risks - - - -  Follow up Falls evaluation completed - - - -    FALL RISK PREVENTION PERTAINING TO THE HOME:  Any stairs in or around the home? No  If so, are there any without handrails? No  Home free of loose throw rugs in walkways, pet beds, electrical cords, etc? Yes  Adequate lighting in your home to reduce risk of falls? Yes   ASSISTIVE DEVICES UTILIZED TO PREVENT FALLS:  Life alert? No  Use of a cane, walker or w/c? No  Grab bars in the bathroom? No  Shower chair or bench in shower? No  Elevated toilet seat or a handicapped toilet? Yes   TIMED UP AND GO:  Was the test performed? No .  Length of time to ambulate 10 feet: 0 sec.   Gait steady and fast without use of assistive device  Cognitive Function: No flowsheet data found.        Immunizations Immunization History  Administered Date(s) Administered   Fluad Quad(high Dose 65+) 07/08/2019   Influenza, High Dose Seasonal PF 08/18/2020   Influenza-Unspecified 07/21/2018   PFIZER SARS-COV-2 Vaccination 12/30/2019, 01/21/2020, 09/03/2020   Pneumococcal Conjugate-13 01/28/2018   Pneumococcal Polysaccharide-23 09/30/2019   Tdap 12/03/2018    TDAP status: Up to date  Flu Vaccine status: Up to date  Pneumococcal vaccine status: Up to date  Covid-19 vaccine status: Completed vaccines  Qualifies for Shingles Vaccine? Yes   Zostavax completed No   Shingrix Completed?: No.    Education has been provided regarding the importance of this vaccine. Patient has been advised to call insurance company to determine out of pocket expense if they have not yet received this vaccine. Advised may also receive vaccine at local  pharmacy or Health Dept. Verbalized acceptance and understanding.  Screening Tests Health Maintenance  Topic Date Due   OPHTHALMOLOGY EXAM  09/30/2019   FOOT EXAM  09/29/2020   HEMOGLOBIN A1C  02/13/2021   MAMMOGRAM  04/08/2021   URINE MICROALBUMIN  08/15/2021   TETANUS/TDAP  12/03/2028   COLONOSCOPY (Pts 45-41yrs Insurance coverage will need to be confirmed)  05/14/2029   INFLUENZA VACCINE  Completed   DEXA SCAN  Completed   COVID-19 Vaccine  Completed   Hepatitis C Screening  Completed   PNA vac Low Risk Adult  Completed    Health Maintenance  Health Maintenance Due  Topic Date Due   OPHTHALMOLOGY EXAM  09/30/2019   FOOT EXAM  09/29/2020    Colorectal cancer screening: Type of screening: Colonoscopy. Completed 05/15/2019. Repeat every 10 years  Mammogram status: Completed 04/09/2019. Repeat every year  Bone Density status: Completed 06/22/2019. Results reflect: Bone density results: OSTEOPENIA. Repeat every 2 years.  Lung Cancer Screening: (Low Dose CT Chest recommended if Age 75-80 years, 30 pack-year currently smoking OR have quit w/in 15years.) does not qualify.   Lung Cancer Screening Referral: no  Additional Screening:  Hepatitis C Screening: does qualify; Completed yes  Vision Screening: Recommended annual ophthalmology exams for early detection of glaucoma and other disorders of the eye. Is the patient up to date with their annual eye exam?  Yes  Who is the provider or what is the name of the office in which the patient attends annual eye exams? [facility] If pt is not established with a provider, would they like to be referred to a provider to establish care? No .   Dental Screening: Recommended annual dental exams for proper oral hygiene  Community Resource Referral / Chronic Care Management: CRR required this visit?  No   CCM required this visit?  No      Plan:     I have personally reviewed and noted the following in the patient's chart:    Medical and social history Use of alcohol, tobacco or illicit drugs  Current medications and supplements Functional ability and status Nutritional status Physical activity Advanced directives List of other physicians Hospitalizations, surgeries, and ER visits in previous 12 months Vitals Screenings to include cognitive, depression, and falls Referrals and appointments  In addition, I have reviewed and discussed with patient certain preventive protocols, quality metrics, and best practice recommendations. A written personalized care plan for preventive services as well as general preventive health recommendations were provided to patient.     Sheral Flow, LPN   [id number]   Nurse Notes:  Patient is cogitatively intact. There were no vitals filed for this visit. There is no height or weight on file to calculate BMI. Patient stated that she has no issues with gait or balance; does not use any assistive devices.   Medical screening examination/treatment/procedure(s) were performed by non-physician practitioner and as supervising physician I was immediately available for consultation/collaboration.  I agree with above. Lew Dawes, MD

## 2020-10-19 NOTE — Patient Instructions (Signed)
Mariah Peterson , Thank you for taking time to come for your Medicare Wellness Visit. I appreciate your ongoing commitment to your health goals. Please review the following plan we discussed and let me know if I can assist you in the future.   Screening recommendations/referrals: Colonoscopy: 05/15/2019; due every 10 years Mammogram: 04/09/2019 Bone Density: 06/22/2019; due every 2 years Recommended yearly ophthalmology/optometry visit for glaucoma screening and checkup Recommended yearly dental visit for hygiene and checkup  Vaccinations: Influenza vaccine: 08/18/2020 Pneumococcal vaccine: up to date Tdap vaccine: 12/03/2018 Shingles vaccine: never done   Covid-19: up to date  Advanced directives: Advance directive discussed with you today. Even though you declined this today please call our office should you change your mind and we can give you the proper paperwork for you to fill out.  Conditions/risks identified: Yes; Reviewed health maintenance screenings with patient today and relevant education, vaccines, and/or referrals were provided. Please continue to do your personal lifestyle choices by: daily care of teeth and gums, regular physical activity (goal should be 5 days a week for 30 minutes), eat a healthy diet, avoid tobacco and drug use, limiting any alcohol intake, taking a low-dose aspirin (if not allergic or have been advised by your provider otherwise) and taking vitamins and minerals as recommended by your provider. Continue doing brain stimulating activities (puzzles, reading, adult coloring books, staying active) to keep memory sharp. Continue to eat heart healthy diet (full of fruits, vegetables, whole grains, lean protein, water--limit salt, fat, and sugar intake) and increase physical activity as tolerated.  Next appointment: Please schedule your next Medicare Wellness Visit with your Nurse Health Advisor in 1 year by calling [phone].   Preventive Care 12 Years and Older,  Female Preventive care refers to lifestyle choices and visits with your health care provider that can promote health and wellness. What does preventive care include?  A yearly physical exam. This is also called an annual well check.  Dental exams once or twice a year.  Routine eye exams. Ask your health care provider how often you should have your eyes checked.  Personal lifestyle choices, including:  Daily care of your teeth and gums.  Regular physical activity.  Eating a healthy diet.  Avoiding tobacco and drug use.  Limiting alcohol use.  Practicing safe sex.  Taking low-dose aspirin every day.  Taking vitamin and mineral supplements as recommended by your health care provider. What happens during an annual well check? The services and screenings done by your health care provider during your annual well check will depend on your age, overall health, lifestyle risk factors, and family history of disease. Counseling  Your health care provider may ask you questions about your:  Alcohol use.  Tobacco use.  Drug use.  Emotional well-being.  Home and relationship well-being.  Sexual activity.  Eating habits.  History of falls.  Memory and ability to understand (cognition).  Work and work Astronomer.  Reproductive health. Screening  You may have the following tests or measurements:  Height, weight, and BMI.  Blood pressure.  Lipid and cholesterol levels. These may be checked every 5 years, or more frequently if you are over 68 years old.  Skin check.  Lung cancer screening. You may have this screening every year starting at age 24 if you have a 30-pack-year history of smoking and currently smoke or have quit within the past 15 years.  Fecal occult blood test (FOBT) of the stool. You may have this test every year starting at  age 79.  Flexible sigmoidoscopy or colonoscopy. You may have a sigmoidoscopy every 5 years or a colonoscopy every 10 years  starting at age 70.  Hepatitis C blood test.  Hepatitis B blood test.  Sexually transmitted disease (STD) testing.  Diabetes screening. This is done by checking your blood sugar (glucose) after you have not eaten for a while (fasting). You may have this done every 1-3 years.  Bone density scan. This is done to screen for osteoporosis. You may have this done starting at age 70.  Mammogram. This may be done every 1-2 years. Talk to your health care provider about how often you should have regular mammograms. Talk with your health care provider about your test results, treatment options, and if necessary, the need for more tests. Vaccines  Your health care provider may recommend certain vaccines, such as:  Influenza vaccine. This is recommended every year.  Tetanus, diphtheria, and acellular pertussis (Tdap, Td) vaccine. You may need a Td booster every 10 years.  Zoster vaccine. You may need this after age 52.  Pneumococcal 13-valent conjugate (PCV13) vaccine. One dose is recommended after age 30.  Pneumococcal polysaccharide (PPSV23) vaccine. One dose is recommended after age 59. Talk to your health care provider about which screenings and vaccines you need and how often you need them. This information is not intended to replace advice given to you by your health care provider. Make sure you discuss any questions you have with your health care provider. Document Released: 11/04/2015 Document Revised: 06/27/2016 Document Reviewed: 08/09/2015 Elsevier Interactive Patient Education  2017 [place] Prevention in the Home Falls can cause injuries. They can happen to people of all ages. There are many things you can do to make your home safe and to help prevent falls. What can I do on the outside of my home?  Regularly fix the edges of walkways and driveways and fix any cracks.  Remove anything that might make you trip as you walk through a door, such as a raised step or  threshold.  Trim any bushes or trees on the path to your home.  Use bright outdoor lighting.  Clear any walking paths of anything that might make someone trip, such as rocks or tools.  Regularly check to see if handrails are loose or broken. Make sure that both sides of any steps have handrails.  Any raised decks and porches should have guardrails on the edges.  Have any leaves, snow, or ice cleared regularly.  Use sand or salt on walking paths during winter.  Clean up any spills in your garage right away. This includes oil or grease spills. What can I do in the bathroom?  Use night lights.  Install grab bars by the toilet and in the tub and shower. Do not use towel bars as grab bars.  Use non-skid mats or decals in the tub or shower.  If you need to sit down in the shower, use a plastic, non-slip stool.  Keep the floor dry. Clean up any water that spills on the floor as soon as it happens.  Remove soap buildup in the tub or shower regularly.  Attach bath mats securely with double-sided non-slip rug tape.  Do not have throw rugs and other things on the floor that can make you trip. What can I do in the bedroom?  Use night lights.  Make sure that you have a light by your bed that is easy to reach.  Do not use any  sheets or blankets that are too big for your bed. They should not hang down onto the floor.  Have a firm chair that has side arms. You can use this for support while you get dressed.  Do not have throw rugs and other things on the floor that can make you trip. What can I do in the kitchen?  Clean up any spills right away.  Avoid walking on wet floors.  Keep items that you use a lot in easy-to-reach places.  If you need to reach something above you, use a strong step stool that has a grab bar.  Keep electrical cords out of the way.  Do not use floor polish or wax that makes floors slippery. If you must use wax, use non-skid floor wax.  Do not have  throw rugs and other things on the floor that can make you trip. What can I do with my stairs?  Do not leave any items on the stairs.  Make sure that there are handrails on both sides of the stairs and use them. Fix handrails that are broken or loose. Make sure that handrails are as long as the stairways.  Check any carpeting to make sure that it is firmly attached to the stairs. Fix any carpet that is loose or worn.  Avoid having throw rugs at the top or bottom of the stairs. If you do have throw rugs, attach them to the floor with carpet tape.  Make sure that you have a light switch at the top of the stairs and the bottom of the stairs. If you do not have them, ask someone to add them for you. What else can I do to help prevent falls?  Wear shoes that:  Do not have high heels.  Have rubber bottoms.  Are comfortable and fit you well.  Are closed at the toe. Do not wear sandals.  If you use a stepladder:  Make sure that it is fully opened. Do not climb a closed stepladder.  Make sure that both sides of the stepladder are locked into place.  Ask someone to hold it for you, if possible.  Clearly mark and make sure that you can see:  Any grab bars or handrails.  First and last steps.  Where the edge of each step is.  Use tools that help you move around (mobility aids) if they are needed. These include:  Canes.  Walkers.  Scooters.  Crutches.  Turn on the lights when you go into a dark area. Replace any light bulbs as soon as they burn out.  Set up your furniture so you have a clear path. Avoid moving your furniture around.  If any of your floors are uneven, fix them.  If there are any pets around you, be aware of where they are.  Review your medicines with your doctor. Some medicines can make you feel dizzy. This can increase your chance of falling. Ask your doctor what other things that you can do to help prevent falls. This information is not intended to  replace advice given to you by your health care provider. Make sure you discuss any questions you have with your health care provider. Document Released: 08/04/2009 Document Revised: 03/15/2016 Document Reviewed: 11/12/2014 Elsevier Interactive Patient Education  2017 [employer].

## 2020-11-09 ENCOUNTER — Other Ambulatory Visit: Payer: Self-pay

## 2020-11-10 ENCOUNTER — Other Ambulatory Visit (INDEPENDENT_AMBULATORY_CARE_PROVIDER_SITE_OTHER): Payer: Medicare Other

## 2020-11-10 DIAGNOSIS — Z794 Long term (current) use of insulin: Secondary | ICD-10-CM

## 2020-11-10 DIAGNOSIS — E1165 Type 2 diabetes mellitus with hyperglycemia: Secondary | ICD-10-CM | POA: Diagnosis not present

## 2020-11-10 LAB — BASIC METABOLIC PANEL
BUN: 30 mg/dL — ABNORMAL HIGH (ref 6–23)
CO2: 28 mEq/L (ref 19–32)
Calcium: 10.2 mg/dL (ref 8.4–10.5)
Chloride: 100 mEq/L (ref 96–112)
Creatinine, Ser: 1.55 mg/dL — ABNORMAL HIGH (ref 0.40–1.20)
GFR: 33 mL/min — ABNORMAL LOW (ref >60.00)
Glucose, Bld: 355 mg/dL — ABNORMAL HIGH (ref 70–99)
Potassium: 4.1 mEq/L (ref 3.5–5.1)
Sodium: 135 mEq/L (ref 135–145)

## 2020-11-10 LAB — HEMOGLOBIN A1C: Hgb A1c MFr Bld: 11.8 % — ABNORMAL HIGH (ref 4.6–6.5)

## 2020-11-15 ENCOUNTER — Encounter: Payer: Self-pay | Admitting: Endocrinology

## 2020-11-15 ENCOUNTER — Ambulatory Visit (INDEPENDENT_AMBULATORY_CARE_PROVIDER_SITE_OTHER): Payer: Medicare Other | Admitting: Endocrinology

## 2020-11-15 ENCOUNTER — Ambulatory Visit: Payer: Medicare Other | Admitting: Endocrinology

## 2020-11-15 ENCOUNTER — Other Ambulatory Visit: Payer: Self-pay

## 2020-11-15 VITALS — BP 128/64 | HR 77 | Ht <= 58 in | Wt 87.0 lb

## 2020-11-15 DIAGNOSIS — I10 Essential (primary) hypertension: Secondary | ICD-10-CM

## 2020-11-15 DIAGNOSIS — E1165 Type 2 diabetes mellitus with hyperglycemia: Secondary | ICD-10-CM | POA: Diagnosis not present

## 2020-11-15 DIAGNOSIS — Z794 Long term (current) use of insulin: Secondary | ICD-10-CM

## 2020-11-15 MED ORDER — PIOGLITAZONE HCL 30 MG PO TABS: 30.0000 mg | ORAL_TABLET | Freq: Every day | ORAL | 0 refills | Status: DC

## 2020-11-15 NOTE — Patient Instructions (Addendum)
Check blood sugars on waking up 7 days a week  Also check blood sugars before about 2 hours after meals and do this after different meals by rotation  Recommended blood sugar levels on waking up are 90-130 and about 2 hours after meal is 130-180  Please bring your blood sugar monitor to each visit, thank you  Restart OZEMPIC 0.25 mg weekly  INSULIN doses:  Increase Lantus insulin to 18 units daily  NOVOLOG insulin: Take 12 units before breakfast and lunch If the blood sugar is over 300 before breakfast or lunch go up to 15 units.  If your blood sugars are starting to get below 100 please call  DIET: Increase frequency of snacks especially in the evening and bedtime  Start PIOGLITAZONE 30 mg tablet every morning, prescription has been sent

## 2020-11-15 NOTE — Progress Notes (Signed)
Patient ID: Mariah Peterson, female   DOB: 1945-11-15, 75 y.o.   MRN: [id number]          Reason for Appointment: Follow-up for Type 2 Diabetes   History of Present Illness:          Date of diagnosis of type 2 diabetes mellitus:?  1990       Background history:   She was started on insulin about 4 years ago and previously taking metformin and glipizide No detailed history is available and she does not think she has taken any other types of diabetes medications or injections  Recent history:    INSULIN regimen is: Lantus 16 units daily in am, Novolog 6 units, 8 ac lunch    Non-insulin hypoglycemic drugs the patient is taking are: None, previously on Actos 15 mg daily, Ozempic 0.5 mg weekly,  Current management, blood sugar patterns and problems identified:  Her A1c is higher at 11.8 Fructosamine is last 484,    She is taking not Ozempic as directed by her PCP last month  This was stopped because of her weight loss but even without Ozempic her weight is down 1 pound  She says that when she takes Ozempic she does not eat as much but has no side effects otherwise  Despite her taking her insulin reportedly regularly her blood sugars are much higher than before and averaging almost 400 at home now  As before blood sugar monitoring even with the freestyle Elenor Legato is very inadequate  Also she did not increase her NovoLog as directed on her last visit  Also she has not had a refill on her pioglitazone for over a year apparently  She is consistently avoiding any drinks with sugar  Lab glucose was 355 in the morning and 398 last month  Interpretation of her CGM data is as follows Glucose sensor: Freestyle libre    Monitoring is highly inadequate and not more than once a day usually, occasionally  Blood sugars have been as high as 480 when checked with a fingerstick which she reveals when blood sugars are above detection limit  Blood sugars are generally staying above 350 almost  all the time except slightly lower overnight on 2 or 3 occasions; lowest FASTING blood sugar 133 about a week ago  Blood sugars are showing marked variability especially overnight  Generally her blood sugars are progressively rising from early morning till late afternoon and then decreasing down by evening  Blood sugar information is incomplete between 6 PM-midnight from lack of monitoring  She has blood sugars over 350 frequently in the afternoon or evening as early as 11 AM and generally looking better in the last 3 to 4 days  Postprandial rise in blood sugar is not assessed  Blood sugars usually to go up significantly if they are lower in the morning  AVERAGE blood sugar 380 with 90% readings over 350  Previous data:  CGM use % of time  48  2-week average/SD  135, GV 37  Time in range  29       %  % Time Above 180  29  % Time above 250  42  % Time Below 70 0     PRE-MEAL Fasting Lunch Dinner  8-10 PM Overall  Glucose range:       Averages:  196   243  109  235   POST-MEAL PC Breakfast PC Lunch PC Dinner  Glucose range:     Averages:  280  306      She was seen by diabetes educator in 9/19       Side effects from medications have been: None  Compliance with the medical regimen: Fair  Typical meal intake: Breakfast is oatmeal, otherwise eggs and toast.  Lunch may be rice and beans or a sandwich at 4 pm Fruit for snacks               Exercise:  During the days is active with her grandson, not much formal exercise   Dietician visit, most recent: 09/2018 CDE consultation: 07/01/2018  Weight history:  Wt Readings from Last 3 Encounters:  11/15/20 87 lb (39.5 kg)  10/10/20 88 lb (39.9 kg)  09/28/20 92 lb 6.4 oz (41.9 kg)    Glycemic control:   Lab Results  Component Value Date   HGBA1C 11.8 (H) 11/10/2020   HGBA1C 11.0 (H) 08/15/2020   HGBA1C 9.1 (H) 03/29/2020   Lab Results  Component Value Date   MICROALBUR 22.6 (H) 08/15/2020   LDLCALC 90 08/15/2020    CREATININE 1.55 (H) 11/10/2020   Lab Results  Component Value Date   MICRALBCREAT 17.4 08/15/2020    Lab Results  Component Value Date   FRUCTOSAMINE 484 (H) 09/23/2020   FRUCTOSAMINE 518 (H) 08/15/2020   FRUCTOSAMINE 369 (H) 03/29/2020    Lab on 11/10/2020  Component Date Value Ref Range Status  . Sodium 11/10/2020 135  135 - 145 mEq/L Final  . Potassium 11/10/2020 4.1  3.5 - 5.1 mEq/L Final  . Chloride 11/10/2020 100  96 - 112 mEq/L Final  . CO2 11/10/2020 28  19 - 32 mEq/L Final  . Glucose, Bld 11/10/2020 355* 70 - 99 mg/dL Final  . BUN 11/10/2020 30* 6 - 23 mg/dL Final  . Creatinine, Ser 11/10/2020 1.55* 0.40 - 1.20 mg/dL Final  . GFR 11/10/2020 33.00* >60.00 mL/min Final   Calculated using the CKD-EPI Creatinine Equation (2021)  . Calcium 11/10/2020 10.2  8.4 - 10.5 mg/dL Final  . Hgb A1c MFr Bld 11/10/2020 11.8* 4.6 - 6.5 % Final   Glycemic Control Guidelines for People with Diabetes:Non Diabetic:  <6%Goal of Therapy: <7%Additional Action Suggested:  >8%     Allergies as of 11/15/2020   No Known Allergies     Medication List       Accurate as of November 15, 2020 11:42 AM. If you have any questions, ask your nurse or doctor.        amLODipine 10 MG tablet Commonly known as: NORVASC Take 1 tablet (10 mg total) by mouth daily.   calcium carbonate 1500 (600 Ca) MG Tabs tablet Commonly known as: OSCAL Take 1 tablet by mouth 2 (two) times daily with a meal.   FreeStyle Libre 14 Day Sensor Misc Inject 1 each into the skin every 14 (fourteen) days.   glucose blood test strip Use as instructed to test blood sugar 3 times daily E11.65   hydrochlorothiazide 12.5 MG capsule Commonly known as: MICROZIDE Take 1 capsule (12.5 mg total) by mouth daily.   Lantus SoloStar 100 UNIT/ML Solostar Pen Generic drug: insulin glargine INJECT 16 UNITS UNDER THE SKIN ONCE DAILY. (UPDATED DOSAGE)   NovoLOG FlexPen 100 UNIT/ML FlexPen Generic drug: insulin aspart INJECT 8  UNITS UNDER THE SKIN BEFORE BREAKFAST AND 6 UNITS BEFORE DINNER.   Ozempic (0.25 or 0.5 MG/DOSE) 2 MG/1.5ML Sopn Generic drug: Semaglutide(0.25 or 0.5MG /DOS) Inject 0.5 mg into the skin once a week.   pioglitazone 30 MG tablet Commonly  known as: Actos Take 1 tablet (30 mg total) by mouth daily. What changed:   medication strength  how much to take Changed by: Elayne Snare, MD   rosuvastatin 40 MG tablet Commonly known as: CRESTOR TAKE 1 TABLET EVERY DAY   Travoprost (BAK Free) 0.004 % Soln ophthalmic solution Commonly known as: TRAVATAN Place 1 drop into both eyes at bedtime.       Allergies: No Known Allergies  Past Medical History:  Diagnosis Date  . Diabetes mellitus without complication ([place])   . Glaucoma   . History of chicken pox   . Hypertension     Past Surgical History:  Procedure Laterality Date  . BREAST BIOPSY Right 2017   benign  . CATARACT EXTRACTION, BILATERAL Bilateral   . CESAREAN SECTION    . COLONOSCOPY  1980's  . TUBAL LIGATION      Family History  Problem Relation Age of Onset  . Hypertension Mother   . Diabetes Brother   . CAD Brother   . Hypertension Brother   . Diabetes Brother   . Hypertension Brother   . Colon cancer Neg Hx   . Esophageal cancer Neg Hx   . Pancreatic cancer Neg Hx   . Liver disease Neg Hx   . Stomach cancer Neg Hx   . Rectal cancer Neg Hx   . Colon polyps Neg Hx     Social History:  reports that she has never smoked. She has never used smokeless tobacco. She reports that she does not drink alcohol and does not use drugs.   Review of Systems   Lipid history: Currently on Crestor 40 mg with the following results, not clear if she is up-to-date with her prescription   Lab Results  Component Value Date   CHOL 167 08/15/2020   HDL 58.90 08/15/2020   LDLCALC 90 08/15/2020   TRIG 91.0 08/15/2020   CHOLHDL 3 08/15/2020           Hypertension: Has been present for several years, on amlodipine and HCTZ  now Creatinine slightly better with stopping ACE inhibitor   BP Readings from Last 3 Encounters:  11/15/20 128/64  10/10/20 140/72  09/28/20 (!) 152/78   Renal function appears to be worse again  No history of microalbuminuria  Lab Results  Component Value Date   CREATININE 1.55 (H) 11/10/2020   CREATININE 1.88 (H) 10/10/2020   CREATININE 1.62 (H) 09/23/2020    Most recent eye exam was in 5/19  Most recent foot exam: 9/20  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  LABS:  Lab on 11/10/2020  Component Date Value Ref Range Status  . Sodium 11/10/2020 135  135 - 145 mEq/L Final  . Potassium 11/10/2020 4.1  3.5 - 5.1 mEq/L Final  . Chloride 11/10/2020 100  96 - 112 mEq/L Final  . CO2 11/10/2020 28  19 - 32 mEq/L Final  . Glucose, Bld 11/10/2020 355* 70 - 99 mg/dL Final  . BUN 11/10/2020 30* 6 - 23 mg/dL Final  . Creatinine, Ser 11/10/2020 1.55* 0.40 - 1.20 mg/dL Final  . GFR 11/10/2020 33.00* >60.00 mL/min Final   Calculated using the CKD-EPI Creatinine Equation (2021)  . Calcium 11/10/2020 10.2  8.4 - 10.5 mg/dL Final  . Hgb A1c MFr Bld 11/10/2020 11.8* 4.6 - 6.5 % Final   Glycemic Control Guidelines for People with Diabetes:Non Diabetic:  <6%Goal of Therapy: <7%Additional Action Suggested:  >8%     Physical Examination:  BP 128/64  Pulse 77   Ht 4\' 8"  (1.422 m)   Wt 87 lb (39.5 kg)   SpO2 97%   BMI 19.51 kg/m        ASSESSMENT:  Diabetes type 2, insulin-dependent    A1c is now 11.8  See history of present illness for discussion of current diabetes management, blood sugar patterns and problems identified  She is on basal bolus insulin only currently She has not regained any weight with stopping Ozempic and has lost another pound probably from mild hyperglycemia that she is having now Also she does not follow instructions for checking her blood sugar, increasing her NovoLog insulin as directed Not taking Actos as previously  prescribed  Renal dysfunction: Better from stopping ACE inhibitor  Hypertension: Now well controlled  PLAN:   She will start using Ozempic 0.25 mg weekly Restart Actos but go up to 30 mg Specific instructions given on her NOVOLOG insulin and she will go up to 12 units insulin 6-8 units She will also take additional 3 units if blood sugars are over 300 Check blood sugars 4 times a day Increase overall calorie intake with extra snacks and possibly nutritional supplements like boost Call if having any low blood sugars or edema  She will check blood pressure regularly at home   Patient Instructions  Check blood sugars on waking up 7 days a week  Also check blood sugars before about 2 hours after meals and do this after different meals by rotation  Recommended blood sugar levels on waking up are 90-130 and about 2 hours after meal is 130-180  Please bring your blood sugar monitor to each visit, thank you  Restart OZEMPIC 0.25 mg weekly  INSULIN doses:  Increase Lantus insulin to 18 units daily  NOVOLOG insulin: Take 12 units before breakfast and lunch If the blood sugar is over 300 before breakfast or lunch go up to 15 units.  If your blood sugars are starting to get below 100 please call  DIET: Increase frequency of snacks especially in the evening and bedtime  Start PIOGLITAZONE 30 mg tablet every morning, prescription has been sent     Elayne Snare 11/15/2020, 11:42 AM   Note: This office note was prepared with Dragon voice recognition system technology. Any transcriptional errors that result from this process are unintentional.

## 2020-11-16 ENCOUNTER — Telehealth: Payer: Self-pay | Admitting: Family

## 2020-11-16 ENCOUNTER — Ambulatory Visit: Payer: Medicare Other | Admitting: Endocrinology

## 2020-11-16 NOTE — Telephone Encounter (Signed)
-----   Message from Elayne Snare, MD sent at 11/15/2020  2:10 PM EST ----- Regarding: RE: Weight loss May be okay to hold off on further work-up, will be seeing her in 3 weeks, thanks ----- Message ----- From: Marrian Salvage, FNP Sent: 11/15/2020   1:04 PM EST To: Elayne Snare, MD Subject: RE: Weight loss                                Thank you for letting me know. I am going to reach out to see if she will let us get a CXR and abd/ pelvic CT to make sure there is not an underlying cancer unless you feel it is the high blood sugars?  I appreciate your thoughts-  ----- Message ----- From: Elayne Snare, MD Sent: 11/15/2020  11:53 AM EST To: Marrian Salvage, FNP Subject: Weight loss                                    Salley Scarlet Mickel Baas: She has not regained any weight with stopping Ozempic and blood sugars have gone up to over 400 at times. She will take 0.25 mg Ozempic, restart Actos and go up on her NovoLog for now Likely needs to add diabetic boost or Glucerna for nutrition also Thanks

## 2020-11-28 ENCOUNTER — Telehealth (INDEPENDENT_AMBULATORY_CARE_PROVIDER_SITE_OTHER): Payer: Medicare Other | Admitting: Family

## 2020-11-28 ENCOUNTER — Other Ambulatory Visit: Payer: Self-pay

## 2020-11-28 DIAGNOSIS — J209 Acute bronchitis, unspecified: Secondary | ICD-10-CM | POA: Diagnosis not present

## 2020-11-28 MED ORDER — BENZONATATE 100 MG PO CAPS: 100.0000 mg | ORAL_CAPSULE | Freq: Three times a day (TID) | ORAL | 0 refills | Status: DC | PRN

## 2020-11-28 MED ORDER — AZITHROMYCIN 250 MG PO TABS: ORAL_TABLET | ORAL | 0 refills | Status: DC

## 2020-11-29 ENCOUNTER — Inpatient Hospital Stay (HOSPITAL_COMMUNITY)
Admission: EM | Admit: 2020-11-29 | Discharge: 2020-12-09 | DRG: 234 | Disposition: A | Discharge status: Home Health | Payer: Medicare Other | Attending: Thoracic Surgery (Cardiothoracic Vascular Surgery) | Admitting: Thoracic Surgery (Cardiothoracic Vascular Surgery)

## 2020-11-29 ENCOUNTER — Emergency Department (HOSPITAL_COMMUNITY): Payer: Medicare Other

## 2020-11-29 ENCOUNTER — Other Ambulatory Visit: Payer: Self-pay

## 2020-11-29 ENCOUNTER — Encounter (HOSPITAL_COMMUNITY): Payer: Self-pay | Admitting: *Deleted

## 2020-11-29 DIAGNOSIS — Z794 Long term (current) use of insulin: Secondary | ICD-10-CM

## 2020-11-29 DIAGNOSIS — E440 Moderate protein-calorie malnutrition: Secondary | ICD-10-CM | POA: Diagnosis present

## 2020-11-29 DIAGNOSIS — I2510 Atherosclerotic heart disease of native coronary artery without angina pectoris: Secondary | ICD-10-CM | POA: Diagnosis present

## 2020-11-29 DIAGNOSIS — I214 Non-ST elevation (NSTEMI) myocardial infarction: Secondary | ICD-10-CM | POA: Diagnosis not present

## 2020-11-29 DIAGNOSIS — N1832 Chronic kidney disease, stage 3b: Secondary | ICD-10-CM | POA: Diagnosis present

## 2020-11-29 DIAGNOSIS — Z8249 Family history of ischemic heart disease and other diseases of the circulatory system: Secondary | ICD-10-CM

## 2020-11-29 DIAGNOSIS — Z01811 Encounter for preprocedural respiratory examination: Secondary | ICD-10-CM

## 2020-11-29 DIAGNOSIS — Z681 Body mass index (BMI) 19 or less, adult: Secondary | ICD-10-CM

## 2020-11-29 DIAGNOSIS — Z79899 Other long term (current) drug therapy: Secondary | ICD-10-CM

## 2020-11-29 DIAGNOSIS — Z9851 Tubal ligation status: Secondary | ICD-10-CM

## 2020-11-29 DIAGNOSIS — R079 Chest pain, unspecified: Secondary | ICD-10-CM | POA: Diagnosis present

## 2020-11-29 DIAGNOSIS — Z833 Family history of diabetes mellitus: Secondary | ICD-10-CM

## 2020-11-29 DIAGNOSIS — I2582 Chronic total occlusion of coronary artery: Secondary | ICD-10-CM | POA: Diagnosis present

## 2020-11-29 DIAGNOSIS — I959 Hypotension, unspecified: Secondary | ICD-10-CM | POA: Diagnosis not present

## 2020-11-29 DIAGNOSIS — Z951 Presence of aortocoronary bypass graft: Secondary | ICD-10-CM

## 2020-11-29 DIAGNOSIS — E1122 Type 2 diabetes mellitus with diabetic chronic kidney disease: Secondary | ICD-10-CM | POA: Diagnosis present

## 2020-11-29 DIAGNOSIS — R0789 Other chest pain: Secondary | ICD-10-CM

## 2020-11-29 DIAGNOSIS — K08109 Complete loss of teeth, unspecified cause, unspecified class: Secondary | ICD-10-CM | POA: Diagnosis present

## 2020-11-29 DIAGNOSIS — E119 Type 2 diabetes mellitus without complications: Secondary | ICD-10-CM

## 2020-11-29 DIAGNOSIS — E785 Hyperlipidemia, unspecified: Secondary | ICD-10-CM | POA: Diagnosis not present

## 2020-11-29 DIAGNOSIS — D62 Acute posthemorrhagic anemia: Secondary | ICD-10-CM | POA: Diagnosis not present

## 2020-11-29 DIAGNOSIS — E1165 Type 2 diabetes mellitus with hyperglycemia: Secondary | ICD-10-CM | POA: Diagnosis present

## 2020-11-29 DIAGNOSIS — J90 Pleural effusion, not elsewhere classified: Secondary | ICD-10-CM

## 2020-11-29 DIAGNOSIS — R52 Pain, unspecified: Secondary | ICD-10-CM | POA: Diagnosis not present

## 2020-11-29 DIAGNOSIS — I10 Essential (primary) hypertension: Secondary | ICD-10-CM | POA: Diagnosis not present

## 2020-11-29 DIAGNOSIS — E8770 Fluid overload, unspecified: Secondary | ICD-10-CM | POA: Diagnosis not present

## 2020-11-29 DIAGNOSIS — I129 Hypertensive chronic kidney disease with stage 1 through stage 4 chronic kidney disease, or unspecified chronic kidney disease: Secondary | ICD-10-CM | POA: Diagnosis present

## 2020-11-29 DIAGNOSIS — Z20822 Contact with and (suspected) exposure to covid-19: Secondary | ICD-10-CM | POA: Diagnosis present

## 2020-11-29 DIAGNOSIS — H409 Unspecified glaucoma: Secondary | ICD-10-CM | POA: Diagnosis present

## 2020-11-29 DIAGNOSIS — I4510 Unspecified right bundle-branch block: Secondary | ICD-10-CM | POA: Diagnosis present

## 2020-11-29 LAB — HEPATIC FUNCTION PANEL
ALT: 18 U/L (ref 0–44)
AST: 21 U/L (ref 15–41)
Albumin: 3.5 g/dL (ref 3.5–5.0)
Alkaline Phosphatase: 44 U/L (ref 38–126)
Bilirubin, Direct: <0.1 mg/dL (ref 0.0–0.2)
Total Bilirubin: 0.6 mg/dL (ref 0.3–1.2)
Total Protein: 6.6 g/dL (ref 6.5–8.1)

## 2020-11-29 LAB — CBG MONITORING, ED: Glucose-Capillary: 355 mg/dL — ABNORMAL HIGH (ref 70–99)

## 2020-11-29 LAB — BASIC METABOLIC PANEL
Anion gap: 13 (ref 5–15)
BUN: 39 mg/dL — ABNORMAL HIGH (ref 8–23)
CO2: 20 mmol/L — ABNORMAL LOW (ref 22–32)
Calcium: 9.6 mg/dL (ref 8.9–10.3)
Chloride: 100 mmol/L (ref 98–111)
Creatinine, Ser: 1.85 mg/dL — ABNORMAL HIGH (ref 0.44–1.00)
GFR, Estimated: 28 mL/min — ABNORMAL LOW (ref >60)
Glucose, Bld: 377 mg/dL — ABNORMAL HIGH (ref 70–99)
Potassium: 4.2 mmol/L (ref 3.5–5.1)
Sodium: 133 mmol/L — ABNORMAL LOW (ref 135–145)

## 2020-11-29 LAB — CBC
HCT: 32.4 % — ABNORMAL LOW (ref 36.0–46.0)
Hemoglobin: 11.2 g/dL — ABNORMAL LOW (ref 12.0–15.0)
MCH: 31.5 pg (ref 26.0–34.0)
MCHC: 34.6 g/dL (ref 30.0–36.0)
MCV: 91.3 fL (ref 80.0–100.0)
Platelets: 237 10*3/uL (ref 150–400)
RBC: 3.55 MIL/uL — ABNORMAL LOW (ref 3.87–5.11)
RDW: 13.2 % (ref 11.5–15.5)
WBC: 6 10*3/uL (ref 4.0–10.5)
nRBC: 0 % (ref 0.0–0.2)

## 2020-11-29 LAB — TROPONIN I (HIGH SENSITIVITY)
Troponin I (High Sensitivity): 23 ng/L — ABNORMAL HIGH (ref <18)
Troponin I (High Sensitivity): 188 ng/L (ref <18)

## 2020-11-29 LAB — D-DIMER, QUANTITATIVE: D-Dimer, Quant: 1.48 ug/mL-FEU — ABNORMAL HIGH (ref 0.00–0.50)

## 2020-11-29 LAB — RESP PANEL BY RT-PCR (FLU A&B, COVID) ARPGX2
Influenza A by PCR: NEGATIVE
Influenza B by PCR: NEGATIVE
SARS Coronavirus 2 by RT PCR: NEGATIVE

## 2020-11-29 LAB — LIPASE, BLOOD: Lipase: 111 U/L — ABNORMAL HIGH (ref 11–51)

## 2020-11-29 MED ORDER — MORPHINE SULFATE (PF) 2 MG/ML IV SOLN: 2.0000 mg | INTRAVENOUS | Status: DC | PRN

## 2020-11-29 MED ORDER — METOPROLOL TARTRATE 12.5 MG HALF TABLET
12.5000 mg | ORAL_TABLET | Freq: Two times a day (BID) | ORAL | Status: DC
  Administered 2020-11-30 – 2020-12-04 (×10): 12.5 mg via ORAL
  Filled 2020-11-29 (×10): qty 1

## 2020-11-29 MED ORDER — NITROGLYCERIN 2 % TD OINT
1.0000 [in_us] | TOPICAL_OINTMENT | Freq: Four times a day (QID) | TRANSDERMAL | Status: DC
  Administered 2020-11-29 – 2020-12-03 (×15): 1 [in_us] via TOPICAL
  Filled 2020-11-29: qty 30
  Filled 2020-11-29 (×4): qty 1

## 2020-11-29 MED ORDER — PANTOPRAZOLE SODIUM 40 MG IV SOLR
40.0000 mg | Freq: Once | INTRAVENOUS | Status: AC
  Administered 2020-11-29: 40 mg via INTRAVENOUS
  Filled 2020-11-29: qty 40

## 2020-11-29 MED ORDER — ASPIRIN 81 MG PO CHEW
324.0000 mg | CHEWABLE_TABLET | Freq: Once | ORAL | Status: AC
  Administered 2020-11-29: 324 mg via ORAL
  Filled 2020-11-29: qty 4

## 2020-11-29 MED ORDER — ROSUVASTATIN CALCIUM 20 MG PO TABS
40.0000 mg | ORAL_TABLET | Freq: Every day | ORAL | Status: DC
  Administered 2020-12-01 – 2020-12-04 (×4): 40 mg via ORAL
  Filled 2020-11-29 (×4): qty 2

## 2020-11-29 MED ORDER — ENOXAPARIN SODIUM 40 MG/0.4ML ~~LOC~~ SOLN
40.0000 mg | SUBCUTANEOUS | Status: DC
  Administered 2020-11-30: 40 mg via SUBCUTANEOUS
  Filled 2020-11-29: qty 0.4

## 2020-11-29 MED ORDER — MORPHINE SULFATE (PF) 2 MG/ML IV SOLN
2.0000 mg | Freq: Once | INTRAVENOUS | Status: AC
  Administered 2020-11-29: 2 mg via INTRAVENOUS
  Filled 2020-11-29: qty 1

## 2020-11-29 MED ORDER — HEPARIN BOLUS VIA INFUSION
2000.0000 [IU] | Freq: Once | INTRAVENOUS | Status: DC
  Filled 2020-11-29: qty 2000

## 2020-11-29 MED ORDER — HEPARIN (PORCINE) 25000 UT/250ML-% IV SOLN
500.0000 [IU]/h | INTRAVENOUS | Status: DC
  Filled 2020-11-29: qty 250

## 2020-11-29 NOTE — H&P (Signed)
History and Physical   PHARRAH Peterson YBO:[id number] DOB: [birth year] DOA: 11/29/2020  Referring MD/NP/PA: Dr. Vallery Ridge  PCP: Marrian Salvage, [place]   Outpatient Specialists: None  Patient coming from: Home  Chief Complaint: Chest pain  HPI: Mariah Peterson is a 75 y.o. female with medical history significant of uncontrolled diabetes with A1c of 11.8, hypertension, hyperlipidemia, with no prior history of coronary artery disease who presents to the ER with sudden onset of substernal chest pain rated as 9 out of 10.  It lasted several minutes get better and then came back.  Associated with some diaphoresis.  It was more of a pressure like someone sitting on her chest.  It returned several times and then became persistent requiring had to come to the ER.  Patient received nitroglycerin and has a patch in place.  Also morphine.  Chest pain has now receded to about 2 out of 10.  She was found to have positive enzymes and ruled in for NSTEMI.  Cardiology consulted a medical admission is being done for possible intervention.  She denied any hemoptysis.  Denied any cough.  Denied any nausea vomiting.,.  ED Course: Temperature 99 blood pressure one 6/74 pulse 97 respirate 22 oxygen sat 99% on room air.  White count 6.0 hemoglobin 11.2 and platelets 237.  Sodium is 133 potassium 4.2 chloride 100 CO2 20 BUN 39 creatinine 1.85 calcium 9.6 glucose 377.  COVID-19 screen is negative.  Initial troponin is 23-second set 188.  A1c came back 11.8 D-dimer 1.48.  EKG showed sinus tachycardia with a rate of 104.  There is incomplete right bundle branch block.  Appears to have some ST depressions in the lateral leads and inferior leads.  Patient presents to have non-ST elevation MI and being admitted for further treatment.  Review of Systems: As per HPI otherwise 10 point review of systems negative.    Past Medical History:  Diagnosis Date  . Diabetes mellitus without complication ([place])   . Glaucoma   . History of  chicken pox   . Hypertension     Past Surgical History:  Procedure Laterality Date  . BREAST BIOPSY Right 2017   benign  . CATARACT EXTRACTION, BILATERAL Bilateral   . CESAREAN SECTION    . COLONOSCOPY  1980's  . TUBAL LIGATION       reports that she has never smoked. She has never used smokeless tobacco. She reports that she does not drink alcohol and does not use drugs.  No Known Allergies  Family History  Problem Relation Age of Onset  . Hypertension Mother   . Diabetes Brother   . CAD Brother   . Hypertension Brother   . Diabetes Brother   . Hypertension Brother   . Colon cancer Neg Hx   . Esophageal cancer Neg Hx   . Pancreatic cancer Neg Hx   . Liver disease Neg Hx   . Stomach cancer Neg Hx   . Rectal cancer Neg Hx   . Colon polyps Neg Hx      Prior to Admission medications   Medication Sig Start Date End Date Taking? Authorizing Provider  amLODipine (NORVASC) 10 MG tablet Take 1 tablet (10 mg total) by mouth daily. 09/28/20  Yes Elayne Snare, MD  azithromycin (ZITHROMAX) 250 MG tablet 2 tabs po qd x 1 day; 1 tablet per day x 4 days; 11/28/20  Yes Marrian Salvage, FNP  benzonatate (TESSALON) 100 MG capsule Take 1 capsule (100 mg total) by mouth  3 (three) times daily as needed. Patient taking differently: Take 100 mg by mouth 3 (three) times daily as needed for cough. 11/28/20  Yes Marrian Salvage, FNP  calcium carbonate (OSCAL) 1500 (600 Ca) MG TABS tablet Take 1 tablet by mouth 2 (two) times daily with a meal.   Yes [provider]  insulin glargine (LANTUS SOLOSTAR) 100 UNIT/ML Solostar Pen INJECT 16 UNITS UNDER THE SKIN ONCE DAILY. (UPDATED DOSAGE) Patient taking differently: Inject 16-18 Units into the skin 2 (two) times daily. INJECT 16 UNITS UNDER THE SKIN IN THE MORNING, EVENING & TAKE 18 UNITS AT LUNCH 09/22/20  Yes Elayne Snare, MD  Semaglutide,0.25 or 0.5MG /DOS, (OZEMPIC, 0.25 OR 0.5 MG/DOSE,) 2 MG/1.5ML SOPN Inject 0.5 mg into the skin once  a week. 08/18/20  Yes Elayne Snare, MD  Continuous Blood Gluc Sensor (FREESTYLE LIBRE 14 DAY SENSOR) MISC Inject 1 each into the skin every 14 (fourteen) days. 03/20/19   Elayne Snare, MD  glucose blood test strip Use as instructed to test blood sugar 3 times daily E11.65 11/24/18   Elayne Snare, MD  hydrochlorothiazide (MICROZIDE) 12.5 MG capsule Take 1 capsule (12.5 mg total) by mouth daily. Patient not taking: No sig reported 09/28/20   Elayne Snare, MD  NOVOLOG FLEXPEN 100 UNIT/ML FlexPen INJECT 8 UNITS UNDER THE SKIN BEFORE BREAKFAST AND 6 UNITS BEFORE DINNER. Patient taking differently: Inject 6-8 Units into the skin 3 (three) times daily with meals. Inject 8 units in the morning, 6 units at lunch and 8 units at bedtime 10/13/20   Elayne Snare, MD  pioglitazone (ACTOS) 30 MG tablet Take 1 tablet (30 mg total) by mouth daily. Patient not taking: Reported on 11/29/2020 11/15/20   Elayne Snare, MD  rosuvastatin (CRESTOR) 40 MG tablet TAKE 1 TABLET EVERY DAY Patient not taking: No sig reported 09/22/20   Elayne Snare, MD  Travoprost, BAK Free, (TRAVATAN) 0.004 % SOLN ophthalmic solution Place 1 drop into both eyes at bedtime. Patient not taking: Reported on 11/29/2020 11/24/18   Elayne Snare, MD    Physical Exam: Vitals:   11/29/20 1634 11/29/20 1635 11/29/20 1646 11/29/20 2000  BP: (!) 161/74   (!) 142/73  Pulse: 97   75  Resp:  (!) 22  18  Temp:  99 F (37.2 C)    TempSrc:  Oral    SpO2: 100%   99%  Weight:   39.9 kg   Height:   4\' 11"  (1.499 m)       Constitutional: NAD, calm, comfortable Vitals:   11/29/20 1634 11/29/20 1635 11/29/20 1646 11/29/20 2000  BP: (!) 161/74   (!) 142/73  Pulse: 97   75  Resp:  (!) 22  18  Temp:  99 F (37.2 C)    TempSrc:  Oral    SpO2: 100%   99%  Weight:   39.9 kg   Height:   4\' 11"  (1.499 m)    Eyes: PERRL, lids and conjunctivae normal ENMT: Mucous membranes are moist. Posterior pharynx clear of any exudate or lesions.Normal dentition.  Neck: normal,  supple, no masses, no thyromegaly Respiratory: clear to auscultation bilaterally, no wheezing, no crackles. Normal respiratory effort. No accessory muscle use.  Cardiovascular: Regular rate and rhythm, no murmurs / rubs / gallops. No extremity edema. 2+ pedal pulses. No carotid bruits.  Abdomen: no tenderness, no masses palpated. No hepatosplenomegaly. Bowel sounds positive.  Musculoskeletal: no clubbing / cyanosis. No joint deformity upper and lower extremities. Good ROM, no contractures. Normal  muscle tone.  Skin: no rashes, lesions, ulcers. No induration Neurologic: CN 2-12 grossly intact. Sensation intact, DTR normal. Strength 5/5 in all 4.  Psychiatric: Normal judgment and insight. Alert and oriented x 3. Normal mood.     Labs on Admission: I have personally reviewed following labs and imaging studies  CBC: Recent Labs  Lab 11/29/20 1638  WBC 6.0  HGB 11.2*  HCT 32.4*  MCV 91.3  PLT 765   Basic Metabolic Panel: Recent Labs  Lab 11/29/20 1638  NA 133*  K 4.2  CL 100  CO2 20*  GLUCOSE 377*  BUN 39*  CREATININE 1.85*  CALCIUM 9.6   GFR: Estimated Creatinine Clearance: 17.1 mL/min (A) (by C-G formula based on SCr of 1.85 mg/dL (H)). Liver Function Tests: Recent Labs  Lab 11/29/20 1711  AST 21  ALT 18  ALKPHOS 44  BILITOT 0.6  PROT 6.6  ALBUMIN 3.5   Recent Labs  Lab 11/29/20 1711  LIPASE 111*   No results for input(s): AMMONIA in the last 168 hours. Coagulation Profile: No results for input(s): INR, PROTIME in the last 168 hours. Cardiac Enzymes: No results for input(s): CKTOTAL, CKMB, CKMBINDEX, TROPONINI in the last 168 hours. BNP (last 3 results) No results for input(s): PROBNP in the last 8760 hours. HbA1C: No results for input(s): HGBA1C in the last 72 hours. CBG: Recent Labs  Lab 11/29/20 1637  GLUCAP 355*   Lipid Profile: No results for input(s): CHOL, HDL, LDLCALC, TRIG, CHOLHDL, LDLDIRECT in the last 72 hours. Thyroid Function  Tests: No results for input(s): TSH, T4TOTAL, FREET4, T3FREE, THYROIDAB in the last 72 hours. Anemia Panel: No results for input(s): VITAMINB12, FOLATE, FERRITIN, TIBC, IRON, RETICCTPCT in the last 72 hours. Urine analysis:    Component Value Date/Time   COLORURINE YELLOW 06/26/2018 1942   APPEARANCEUR HAZY (A) 06/26/2018 1942   LABSPEC 1.011 06/26/2018 1942   PHURINE 5.0 06/26/2018 1942   GLUCOSEU >=500 (A) 06/26/2018 1942   HGBUR SMALL (A) 06/26/2018 1942   BILIRUBINUR NEGATIVE 06/26/2018 1942   BILIRUBINUR neg 03/22/2014 1025   KETONESUR NEGATIVE 06/26/2018 1942   PROTEINUR NEGATIVE 06/26/2018 1942   UROBILINOGEN 0.2 03/22/2014 1025   UROBILINOGEN 0.2 02/13/2014 0241   NITRITE NEGATIVE 06/26/2018 1942   LEUKOCYTESUR NEGATIVE 06/26/2018 1942   Sepsis Labs: @LABRCNTIP (procalcitonin:4,lacticidven:4) ) Recent Results (from the past 240 hour(s))  Resp Panel by RT-PCR (Flu A&B, Covid) Nasopharyngeal Swab     Status: None   Collection Time: 11/29/20  5:13 PM   Specimen: Nasopharyngeal Swab; Nasopharyngeal(NP) swabs in vial transport medium  Result Value Ref Range Status   SARS Coronavirus 2 by RT PCR NEGATIVE NEGATIVE Final    Comment: (NOTE) SARS-CoV-2 target nucleic acids are NOT DETECTED.  The SARS-CoV-2 RNA is generally detectable in upper respiratory specimens during the acute phase of infection. The lowest concentration of SARS-CoV-2 viral copies this assay can detect is 138 copies/mL. A negative result does not preclude SARS-Cov-2 infection and should not be used as the sole basis for treatment or other patient management decisions. A negative result may occur with  improper specimen collection/handling, submission of specimen other than nasopharyngeal swab, presence of viral mutation(s) within the areas targeted by this assay, and inadequate number of viral copies(<138 copies/mL). A negative result must be combined with clinical observations, patient history, and  epidemiological information. The expected result is Negative.  Fact Sheet for Patients:  EntrepreneurPulse.com.au  Fact Sheet for Healthcare Providers:  IncredibleEmployment.be  This test is no t  yet approved or cleared by the [place] and  has been authorized for detection and/or diagnosis of SARS-CoV-2 by FDA under an Emergency Use Authorization (EUA). This EUA will remain  in effect (meaning this test can be used) for the duration of the COVID-19 declaration under Section 564(b)(1) of the Act, 21 U.S.C.section 360bbb-3(b)(1), unless the authorization is terminated  or revoked sooner.       Influenza A by PCR NEGATIVE NEGATIVE Final   Influenza B by PCR NEGATIVE NEGATIVE Final    Comment: (NOTE) The Xpert Xpress SARS-CoV-2/FLU/RSV plus assay is intended as an aid in the diagnosis of influenza from Nasopharyngeal swab specimens and should not be used as a sole basis for treatment. Nasal washings and aspirates are unacceptable for Xpert Xpress SARS-CoV-2/FLU/RSV testing.  Fact Sheet for Patients: EntrepreneurPulse.com.au  Fact Sheet for Healthcare Providers: IncredibleEmployment.be  This test is not yet approved or cleared by the [place] FDA and has been authorized for detection and/or diagnosis of SARS-CoV-2 by FDA under an Emergency Use Authorization (EUA). This EUA will remain in effect (meaning this test can be used) for the duration of the COVID-19 declaration under Section 564(b)(1) of the Act, 21 U.S.C. section 360bbb-3(b)(1), unless the authorization is terminated or revoked.  Performed at [facility], [place], [place], [place] [address]      Radiological Exams on Admission: CT Chest Wo Contrast  Result Date: 11/29/2020 CLINICAL DATA:  Centralized chest pain and cough which began this morning EXAM: CT CHEST WITHOUT CONTRAST TECHNIQUE: Multidetector CT imaging of  the chest was performed following the standard protocol without IV contrast. COMPARISON:  Radiograph 11/29/2020 FINDINGS: Cardiovascular: Normal cardiac size. Trace pericardial effusion. Three-vessel coronary artery atherosclerosis. Atherosclerotic plaque within the normal caliber aorta. No hyperdense mural thickening or plaque displacement to suggest intramural hematoma. No periaortic stranding or hemorrhage. Central pulmonary arteries are normal caliber. Luminal evaluation precluded in the absence of contrast media. No major venous abnormalities are evident. Mediastinum/Nodes: No mediastinal fluid or gas. Normal thyroid gland and thoracic inlet. No acute abnormality of the trachea or esophagus. No worrisome mediastinal or axillary adenopathy. Hilar nodal evaluation is limited in the absence of intravenous contrast media. Lungs/Pleura: No consolidation, features of edema, pneumothorax, or effusion. No suspicious pulmonary nodules or masses. Few scattered tiny calcified granulomata. Upper Abdomen: No acute abnormalities present in the visualized portions of the upper abdomen. Musculoskeletal: Multilevel degenerative changes are present in the imaged portions of the spine. No acute osseous abnormality or suspicious osseous lesion. No worrisome chest wall masses or lesions. IMPRESSION: 1. No acute intrathoracic process to provide a cause for patient's symptoms. 2. Sequela of prior granulomatous disease with few scattered calcified granulomata in the lungs. 3. Three-vessel coronary artery atherosclerosis. 4. Aortic Atherosclerosis (ICD10-I70.0). Electronically Signed   By: Lovena Le M.D.   On: 11/29/2020 19:22   DG Chest Port 1 View  Result Date: 11/29/2020 CLINICAL DATA:  Chest pain and shortness of breath EXAM: PORTABLE CHEST 1 VIEW COMPARISON:  None. FINDINGS: The heart size and mediastinal contours are within normal limits. Both lungs are clear. The visualized skeletal structures are unremarkable. IMPRESSION:  No active disease. Electronically Signed   By: Prudencio Pair M.D.   On: 11/29/2020 18:59      Assessment/Plan Principal Problem:   Chest pain Active Problems:   Hyperlipidemia   Diabetes mellitus ([place])   Hypertension     #1 non-ST elevation MI: Currently asymptomatic.  Patient will be admitted to  monitored bed.  Cardiology consulted.  Patient may likely get cardiac cath in the morning.  She has all the risk factors for coronary artery disease.  #2 diabetes: Initiate sliding scale insulin in addition to her home regimen.  #3 essential hypertension: Continue home regimen.  #4 hyperlipidemia: Continue statin.    DVT prophylaxis: Therapeutic Lovenox Code Status: Full code Family Communication: Husband at bedside Disposition Plan: Home Consults called: Dr Clois Dupes, Cardiology  Admission status: Inpatient   Severity of Illness: The appropriate patient status for this patient is OBSERVATION. Observation status is judged to be reasonable and necessary in order to provide the required intensity of service to ensure the patient's safety. The patient's presenting symptoms, physical exam findings, and initial radiographic and laboratory data in the context of their medical condition is felt to place them at decreased risk for further clinical deterioration. Furthermore, it is anticipated that the patient will be medically stable for discharge from the hospital within 2 midnights of admission. The following factors support the patient status of observation.   " The patient's presenting symptoms include chest pain. " The physical exam findings include Normal findings. " The initial radiographic and laboratory data are Elevated troponin and Abnormal EKG.     Barbette Merino MD [facility] Pager [phone]  If 7PM-7AM, please contact night-coverage [email or web address] Password [email or web address]  11/29/2020, 9:27 PM

## 2020-11-29 NOTE — ED Notes (Signed)
Called lab to add on hepatic panel and lipase.

## 2020-11-29 NOTE — ED Notes (Signed)
Date and time results received: 11/29/20 2027 (use smartphrase ".now" to insert current time)  Test: Troponin Critical Value: 188  Name of Provider Notified: Johnney Killian, MD  Orders Received? Or Actions Taken?:

## 2020-11-29 NOTE — ED Triage Notes (Signed)
Pt reports having mid chest pain x 3 hours with mild sob. Denies cardiac hx.

## 2020-11-29 NOTE — ED Provider Notes (Signed)
[place] EMERGENCY DEPARTMENT Provider Note   CSN: [id number] Arrival date & time: 11/29/20  7510     History Chief Complaint  Patient presents with  . Chest Pain  . Shortness of Breath    Mariah Peterson is a 75 y.o. female.  HPI Patient's for symptoms started with nasal congestion and sore throat about 4 weeks ago.  That has been persistent.  He has also been coughing for about 2 weeks.  She denies she has felt short of breath.  She was not experiencing any chest pain.  Yesterday, patient had a televisit and was prescribed Zithromax for suspected bronchitis.  She reports that today about 3 hours prior to arrival, she started getting a severe pain in her central chest.  It is a deep aching and persistent pain.  Nothing is making it better or worse.  She denies it is radiating to her shoulders or her back.  She denies it in the abdomen.  She does not feel more short of breath with it.  No syncope or lightheadedness.  No swelling of the legs or pain in the calves.  She reports occasionally over the course of her illness she is "coughed up" some watery material but denies having any significant production of sputum or blood.  She denies she is had vomiting.  Patient denies history of any similar pain.    Past Medical History:  Diagnosis Date  . Diabetes mellitus without complication ([place])   . Glaucoma   . History of chicken pox   . Hypertension     Patient Active Problem List   Diagnosis Date Noted  . Glaucoma 03/31/2019  . Healthcare maintenance 12/03/2018  . Anemia 12/03/2018  . Hypertension 10/31/2018  . Hyperlipidemia 08/04/2013  . Diabetes mellitus ([place]) 08/04/2013    Past Surgical History:  Procedure Laterality Date  . BREAST BIOPSY Right 2017   benign  . CATARACT EXTRACTION, BILATERAL Bilateral   . CESAREAN SECTION    . COLONOSCOPY  1980's  . TUBAL LIGATION       OB History   No obstetric history on file.     Family History  Problem  Relation Age of Onset  . Hypertension Mother   . Diabetes Brother   . CAD Brother   . Hypertension Brother   . Diabetes Brother   . Hypertension Brother   . Colon cancer Neg Hx   . Esophageal cancer Neg Hx   . Pancreatic cancer Neg Hx   . Liver disease Neg Hx   . Stomach cancer Neg Hx   . Rectal cancer Neg Hx   . Colon polyps Neg Hx     Social History   Tobacco Use  . Smoking status: Never Smoker  . Smokeless tobacco: Never Used  Vaping Use  . Vaping Use: Never used  Substance Use Topics  . Alcohol use: No  . Drug use: No    Home Medications Prior to Admission medications   Medication Sig Start Date End Date Taking? Authorizing Provider  amLODipine (NORVASC) 10 MG tablet Take 1 tablet (10 mg total) by mouth daily. 09/28/20  Yes Elayne Snare, MD  azithromycin (ZITHROMAX) 250 MG tablet 2 tabs po qd x 1 day; 1 tablet per day x 4 days; 11/28/20  Yes Marrian Salvage, FNP  benzonatate (TESSALON) 100 MG capsule Take 1 capsule (100 mg total) by mouth 3 (three) times daily as needed. Patient taking differently: Take 100 mg by mouth 3 (three) times daily  as needed for cough. 11/28/20  Yes Marrian Salvage, FNP  calcium carbonate (OSCAL) 1500 (600 Ca) MG TABS tablet Take 1 tablet by mouth 2 (two) times daily with a meal.   Yes [provider]  insulin glargine (LANTUS SOLOSTAR) 100 UNIT/ML Solostar Pen INJECT 16 UNITS UNDER THE SKIN ONCE DAILY. (UPDATED DOSAGE) Patient taking differently: Inject 16-18 Units into the skin 2 (two) times daily. INJECT 16 UNITS UNDER THE SKIN IN THE MORNING, EVENING & TAKE 18 UNITS AT LUNCH 09/22/20  Yes Elayne Snare, MD  Semaglutide,0.25 or 0.5MG /DOS, (OZEMPIC, 0.25 OR 0.5 MG/DOSE,) 2 MG/1.5ML SOPN Inject 0.5 mg into the skin once a week. 08/18/20  Yes Elayne Snare, MD  Continuous Blood Gluc Sensor (FREESTYLE LIBRE 14 DAY SENSOR) MISC Inject 1 each into the skin every 14 (fourteen) days. 03/20/19   Elayne Snare, MD  glucose blood test strip Use  as instructed to test blood sugar 3 times daily E11.65 11/24/18   Elayne Snare, MD  hydrochlorothiazide (MICROZIDE) 12.5 MG capsule Take 1 capsule (12.5 mg total) by mouth daily. Patient not taking: No sig reported 09/28/20   Elayne Snare, MD  NOVOLOG FLEXPEN 100 UNIT/ML FlexPen INJECT 8 UNITS UNDER THE SKIN BEFORE BREAKFAST AND 6 UNITS BEFORE DINNER. Patient taking differently: Inject 6-8 Units into the skin 3 (three) times daily with meals. Inject 8 units in the morning, 6 units at lunch and 8 units at bedtime 10/13/20   Elayne Snare, MD  pioglitazone (ACTOS) 30 MG tablet Take 1 tablet (30 mg total) by mouth daily. Patient not taking: Reported on 11/29/2020 11/15/20   Elayne Snare, MD  rosuvastatin (CRESTOR) 40 MG tablet TAKE 1 TABLET EVERY DAY Patient not taking: No sig reported 09/22/20   Elayne Snare, MD  Travoprost, BAK Free, (TRAVATAN) 0.004 % SOLN ophthalmic solution Place 1 drop into both eyes at bedtime. Patient not taking: Reported on 11/29/2020 11/24/18   Elayne Snare, MD    Allergies    Patient has no known allergies.  Review of Systems   Review of Systems 10 systems reviewed and negative except as per HPI Physical Exam Updated Vital Signs BP (!) 142/73   Pulse 75   Temp 99 F (37.2 C) (Oral)   Resp 18   Ht 4\' 11"  (1.499 m)   Wt 39.9 kg   SpO2 99%   BMI 17.77 kg/m   Physical Exam Constitutional:      Comments: Alert and nontoxic.  Well-nourished well-developed.  Clinically well in appearance.  Appears to be in pain and holding her chest  HENT:     Head: Normocephalic and atraumatic.     Mouth/Throat:     Mouth: Mucous membranes are moist.     Pharynx: Oropharynx is clear.  Eyes:     Extraocular Movements: Extraocular movements intact.  Cardiovascular:     Rate and Rhythm: Normal rate and regular rhythm.  Pulmonary:     Effort: Pulmonary effort is normal.     Breath sounds: Normal breath sounds.     Comments: Mild chest tenderness to palpation over the sternum and  costosternal borders anteriorly Abdominal:     General: There is no distension.     Palpations: Abdomen is soft.     Tenderness: There is no abdominal tenderness. There is no guarding.  Musculoskeletal:        General: No swelling or tenderness. Normal range of motion.     Right lower leg: No edema.     Left lower leg:  No edema.  Skin:    General: Skin is warm and dry.  Neurological:     General: No focal deficit present.     Mental Status: She is oriented to person, place, and time.     Coordination: Coordination normal.  Psychiatric:        Mood and Affect: Mood normal.     ED Results / Procedures / Treatments   Labs (all labs ordered are listed, but only abnormal results are displayed) Labs Reviewed  BASIC METABOLIC PANEL - Abnormal; Notable for the following components:      Result Value   Sodium 133 (*)    CO2 20 (*)    Glucose, Bld 377 (*)    BUN 39 (*)    Creatinine, Ser 1.85 (*)    GFR, Estimated 28 (*)    All other components within normal limits  CBC - Abnormal; Notable for the following components:   RBC 3.55 (*)    Hemoglobin 11.2 (*)    HCT 32.4 (*)    All other components within normal limits  LIPASE, BLOOD - Abnormal; Notable for the following components:   Lipase 111 (*)    All other components within normal limits  D-DIMER, QUANTITATIVE (NOT AT [facility]) - Abnormal; Notable for the following components:   D-Dimer, Quant 1.48 (*)    All other components within normal limits  CBG MONITORING, ED - Abnormal; Notable for the following components:   Glucose-Capillary 355 (*)    All other components within normal limits  TROPONIN I (HIGH SENSITIVITY) - Abnormal; Notable for the following components:   Troponin I (High Sensitivity) 23 (*)    All other components within normal limits  TROPONIN I (HIGH SENSITIVITY) - Abnormal; Notable for the following components:   Troponin I (High Sensitivity) 188 (*)    All other components within normal limits  RESP PANEL  BY RT-PCR (FLU A&B, COVID) [id number]  HEPATIC FUNCTION PANEL  HEPARIN LEVEL (UNFRACTIONATED)  CBC    EKG EKG Interpretation  Date/Time:  Tuesday November 29 2020 16:25:55 EST Ventricular Rate:  104 PR Interval:  154 QRS Duration: 108 QT Interval:  382 QTC Calculation: 502 R Axis:   22 Text Interpretation: Sinus tachycardia Incomplete right bundle branch block ST elevation consider inferior injury or acute infarct no STEMI, n old comparison Confirmed by Charlesetta Shanks [phone]) on 11/29/2020 4:54:53 PM   Radiology CT Chest Wo Contrast  Result Date: 11/29/2020 CLINICAL DATA:  Centralized chest pain and cough which began this morning EXAM: CT CHEST WITHOUT CONTRAST TECHNIQUE: Multidetector CT imaging of the chest was performed following the standard protocol without IV contrast. COMPARISON:  Radiograph 11/29/2020 FINDINGS: Cardiovascular: Normal cardiac size. Trace pericardial effusion. Three-vessel coronary artery atherosclerosis. Atherosclerotic plaque within the normal caliber aorta. No hyperdense mural thickening or plaque displacement to suggest intramural hematoma. No periaortic stranding or hemorrhage. Central pulmonary arteries are normal caliber. Luminal evaluation precluded in the absence of contrast media. No major venous abnormalities are evident. Mediastinum/Nodes: No mediastinal fluid or gas. Normal thyroid gland and thoracic inlet. No acute abnormality of the trachea or esophagus. No worrisome mediastinal or axillary adenopathy. Hilar nodal evaluation is limited in the absence of intravenous contrast media. Lungs/Pleura: No consolidation, features of edema, pneumothorax, or effusion. No suspicious pulmonary nodules or masses. Few scattered tiny calcified granulomata. Upper Abdomen: No acute abnormalities present in the visualized portions of the upper abdomen. Musculoskeletal: Multilevel degenerative changes are present in the imaged portions of the spine. No acute  osseous abnormality or  suspicious osseous lesion. No worrisome chest wall masses or lesions. IMPRESSION: 1. No acute intrathoracic process to provide a cause for patient's symptoms. 2. Sequela of prior granulomatous disease with few scattered calcified granulomata in the lungs. 3. Three-vessel coronary artery atherosclerosis. 4. Aortic Atherosclerosis (ICD10-I70.0). Electronically Signed   By: Lovena Le M.D.   On: 11/29/2020 19:22   DG Chest Port 1 View  Result Date: 11/29/2020 CLINICAL DATA:  Chest pain and shortness of breath EXAM: PORTABLE CHEST 1 VIEW COMPARISON:  None. FINDINGS: The heart size and mediastinal contours are within normal limits. Both lungs are clear. The visualized skeletal structures are unremarkable. IMPRESSION: No active disease. Electronically Signed   By: Prudencio Pair M.D.   On: 11/29/2020 18:59    Procedures Procedures  CRITICAL CARE Performed by: Charlesetta Shanks   Total critical care time: 33 minutes  Critical care time was exclusive of separately billable procedures and treating other patients.  Critical care was necessary to treat or prevent imminent or life-threatening deterioration.  Critical care was time spent personally by me on the following activities: development of treatment plan with patient and/or surrogate as well as nursing, discussions with consultants, evaluation of patient's response to treatment, examination of patient, obtaining history from patient or surrogate, ordering and performing treatments and interventions, ordering and review of laboratory studies, ordering and review of radiographic studies, pulse oximetry and re-evaluation of patient's condition. Medications Ordered in ED Medications  morphine 2 MG/ML injection 2 mg (has no administration in time range)  aspirin chewable tablet 324 mg (has no administration in time range)  nitroGLYCERIN (NITROGLYN) 2 % ointment 1 inch (1 inch Topical Given 11/29/20 2118)  heparin bolus via infusion 2,000 Units (has no  administration in time range)  heparin ADULT infusion 100 units/mL (25000 units/227mL) (has no administration in time range)  morphine 2 MG/ML injection 2 mg (2 mg Intravenous Given 11/29/20 1812)  pantoprazole (PROTONIX) injection 40 mg (40 mg Intravenous Given 11/29/20 1813)    ED Course  I have reviewed the triage vital signs and the nursing notes.  Pertinent labs & imaging results that were available during my care of the patient were reviewed by me and considered in my medical decision making (see chart for details).  Clinical Course as of 11/29/20 2120  Tue Nov 29, 2020  2040 Patient updated on elevation in troponin.  Patient's daughter updated by phone call.  Patient reports she is pain-free.  Aspirin and heparin added to orders. Cardiology consulted [MP]  2102 Consult: Cardiology Dr. Clois Dupes.  Will evaluate the patient emergency department.  Requests medical admission. [MP]  2120 Consult: [facility] hospitalist Dr. Jonelle Sidle for admission. [MP]    Clinical Course User Index [MP] Charlesetta Shanks, MD   MDM Rules/Calculators/A&P                         Patient reports she had sudden onset of central chest pain about 2 hours prior to arrival.  Is been constant and very uncomfortable.  Both pressure and burning quality.  First EKG did not show acute ischemic changes.  Troponin has elevated.  Patient pain-free after 2 mg of morphine and Protonix 40 mg IV.  Patient has mild lipase elevation but abdomen is nontender.  CT does not show any acute inflammatory changes.  At this time with central chest pain onset prior to arrival and troponins elevating, will initiate heparin and aspirin.  Cardiology consulted and medical  team consulted for admission.  At this time patient is pain-free with stable vital signs.  Final Clinical Impression(s) / ED Diagnoses Final diagnoses:  NSTEMI (non-ST elevated myocardial infarction) [facility])    Rx / [place] Orders ED Discharge Orders    None       Charlesetta Shanks,  MD 11/29/20 2122

## 2020-11-29 NOTE — Consult Note (Addendum)
CARDIOLOGY CONSULT NOTE  Patient ID: Mariah Peterson MRN: [id number] DOB/AGE: [birth year] 74 y.o.  Admit date: 11/29/2020 Primary Physician Marrian Salvage, [place] Primary Cardiologist None Chief Complaint  Chest Pain Requesting  ED  HPI:  Mariah Peterson is a 75 year old Peterson with DMII (A1c 11.8%), HTN, and HLD who presents for evaluation of chest pain and was found to have an NSTEMI. Cardiology is now consulted for assistance.   Patient reports that she was in her USOH until around noon on the day of admission when she developed acute, severe 9/10, substernal chest pressure without radiation. She tried not to make much of it, but noticed that the symptoms were persistent which worried her and so she decided that it was best to come in for evaluation. She denies any other associated symptoms. She denied dyspnea/tachypnea, paroxysmal nocturnal dyspnea/orthopnea, irregular heart beat/palpitations, presyncope/syncope, lower extremity edema, claudication, or abdominal distention.   In the ED, her initial EKG did not show any dynamic ST-T changes. Her vital signs were stable. Initial troponin was 23 and uptrended to 188. She was fortunately chest pain free at the time of my interview.    Past Medical History:  Diagnosis Date  . Diabetes mellitus without complication ([place])   . Glaucoma   . History of chicken pox   . Hypertension     Past Surgical History:  Procedure Laterality Date  . BREAST BIOPSY Right 2017   benign  . CATARACT EXTRACTION, BILATERAL Bilateral   . CESAREAN SECTION    . COLONOSCOPY  1980's  . TUBAL LIGATION      No Known Allergies (Not in a hospital admission)  Family History  Problem Relation Age of Onset  . Hypertension Mother   . Diabetes Brother   . CAD Brother   . Hypertension Brother   . Diabetes Brother   . Hypertension Brother   . Colon cancer Neg Hx   . Esophageal cancer Neg Hx   . Pancreatic cancer Neg Hx   . Liver disease Neg Hx   . Stomach  cancer Neg Hx   . Rectal cancer Neg Hx   . Colon polyps Neg Hx     Social History   Socioeconomic History  . Marital status: Married    Spouse name: Not on file  . Number of children: 1  . Years of education: Not on file  . Highest education level: Not on file  Occupational History  . Occupation: Retired  Tobacco Use  . Smoking status: Never Smoker  . Smokeless tobacco: Never Used  Vaping Use  . Vaping Use: Never used  Substance and Sexual Activity  . Alcohol use: No  . Drug use: No  . Sexual activity: Yes  Other Topics Concern  . Not on file  Social History Narrative  . Not on file   Social Determinants of Health   Financial Resource Strain: Low Risk   . Difficulty of Paying Living Expenses: Not hard at all  Food Insecurity: No Food Insecurity  . Worried About Charity fundraiser in the Last Year: Never true  . Ran Out of Food in the Last Year: Never true  Transportation Needs: No Transportation Needs  . Lack of Transportation (Medical): No  . Lack of Transportation (Non-Medical): No  Physical Activity: Sufficiently Active  . Days of Exercise per Week: 5 days  . Minutes of Exercise per Session: 30 min  Stress: No Stress Concern Present  . Feeling of Stress : Not at  all  Social Connections: Socially Integrated  . Frequency of Communication with Friends and Family: More than three times a week  . Frequency of Social Gatherings with Friends and Family: More than three times a week  . Attends Religious Services: More than 4 times per year  . Active Member of Clubs or Organizations: Yes  . Attends Archivist Meetings: More than 4 times per year  . Marital Status: Married  Human resources officer Violence: Not At Risk  . Fear of Current or Ex-Partner: No  . Emotionally Abused: No  . Physically Abused: No  . Sexually Abused: No     Review of Systems: [y] = yes, [ ]  = no       General: Weight gain [] ; Weight loss [ ] ; Anorexia [ ] ; Fatigue [ ] ; Fever [ ] ;  Chills [ ] ; Weakness [ ]     Cardiac: As reported in HPI  Pulmonary: Cough [ ] ; Wheezing[ ] ; Hemoptysis[ ] ; Sputum [ ] ; Snoring [ ]     GI: Vomiting[ ] ; Dysphagia[ ] ; Melena[ ] ; Hematochezia [ ] ; Heartburn[ ] ; Abdominal pain [ ] ; Constipation [ ] ; Diarrhea [ ] ; BRBPR [ ]     GU: Hematuria[ ] ; Dysuria [ ] ; Nocturia[ ]   Vascular: Pain in legs with walking [ ] ; Pain in feet with lying flat [ ] ; Non-healing sores [ ] ; Stroke [ ] ; TIA [ ] ; Slurred speech [ ] ;    Neuro: Headaches[ ] ; Vertigo[ ] ; Seizures[ ] ; Paresthesias[ ] ;Blurred vision [ ] ; Diplopia [ ] ; Vision changes [ ]     Ortho/Skin: Arthritis [ ] ; Joint pain [ ] ; Muscle pain [ ] ; Joint swelling [ ] ; Back Pain [ ] ; Rash [ ]     Psych: Depression[ ] ; Anxiety[ ]     Heme: Bleeding problems [ ] ; Clotting disorders [ ] ; Anemia [ ]     Endocrine: Diabetes [ ] ; Thyroid dysfunction[ ]   Physical Exam: Blood pressure (!) 142/73, pulse 75, temperature 99 F (37.2 C), temperature source Oral, resp. rate 18, height 4\' 11"  (1.499 m), weight 39.9 kg, SpO2 99 %.   GENERAL: Patient is afebrile, Vital signs reviewed, Well appearing, Patient appears comfortable, Alert and lucid. EYES: Normal inspection. HEENT:  normocephalic, atraumatic  NECK:  supple , normal inspection. CARD: Tachy, regular rhythm, heart sounds normal. RESP:  no respiratory distress, breath sounds normal. ABD: soft, nontender, nondistended, normal bowel sounds MUSC:  normal ROM, non-tender , no pedal edema . SKIN: color normal, no rash, warm, dry . NEURO: awake & alert, lucid, no motor/sensory deficit.  PSYCH: mood/affect normal.  Labs: Lab Results  Component Value Date   BUN 39 (H) 11/29/2020   Lab Results  Component Value Date   CREATININE 1.85 (H) 11/29/2020   Lab Results  Component Value Date   NA 133 (L) 11/29/2020   K 4.2 11/29/2020   CL 100 11/29/2020   CO2 20 (L) 11/29/2020   No results found for: TROPONINI Lab Results  Component Value Date   WBC 6.0  11/29/2020   HGB 11.2 (L) 11/29/2020   HCT 32.4 (L) 11/29/2020   MCV 91.3 11/29/2020   PLT 237 11/29/2020   Lab Results  Component Value Date   CHOL 167 08/15/2020   HDL 58.90 08/15/2020   LDLCALC 90 08/15/2020   TRIG 91.0 08/15/2020   CHOLHDL 3 08/15/2020   Lab Results  Component Value Date   ALT 18 11/29/2020   AST 21 11/29/2020   ALKPHOS 44 11/29/2020   BILITOT 0.6 11/29/2020  Radiology:   CXR: No active disease            CT Chest w/o contrast: 1. No acute intrathoracic process to provide a cause for patient's symptoms. 2. Sequela of prior granulomatous disease with few scattered calcified granulomata in the lungs. 3. Three-vessel coronary artery atherosclerosis. 4. Aortic Atherosclerosis   EKG: Sinus tach, incomplete RBBB, no dynamic ST-T changes  ASSESSMENT AND PLAN:  Mariah Peterson with DMII (A1c 11.8%), HTN, and HLD who presents for evaluation of chest pain and was found to have an NSTEMI. Cardiology is now consulted for assistance.   # NSTEMI # DMII # HTN # HLD - Tentative plan for diagnostic LHC in AM  - Patient to be NPO at midnight except for meds - Enoxparin protocol for ACS - Symptom management with Nitro gtt - Trend trop and serial ECGs - TTE in AM - Aggressive risk factor modification with glycemic control and GDMT              - Lipid panel pending  - Start Metoprolol 12.5mg  BID  - Cont Rosuvastatin - Management of other chronic medical issues per primary admitting team   Signed: Clois Dupes 11/29/2020, 11:20 PM

## 2020-11-29 NOTE — Progress Notes (Signed)
Mariah Peterson is a 75 y.o. female with the following history as recorded in Epic[facility]:  Patient Active Problem List   Diagnosis Date Noted  . Glaucoma 03/31/2019  . Healthcare maintenance 12/03/2018  . Anemia 12/03/2018  . Hypertension 10/31/2018  . Hyperlipidemia 08/04/2013  . Diabetes mellitus ([place]) 08/04/2013    Current Outpatient Medications  Medication Sig Dispense Refill  . azithromycin (ZITHROMAX) 250 MG tablet 2 tabs po qd x 1 day; 1 tablet per day x 4 days; 6 tablet 0  . benzonatate (TESSALON) 100 MG capsule Take 1 capsule (100 mg total) by mouth 3 (three) times daily as needed. 20 capsule 0  . amLODipine (NORVASC) 10 MG tablet Take 1 tablet (10 mg total) by mouth daily. 30 tablet 3  . calcium carbonate (OSCAL) 1500 (600 Ca) MG TABS tablet Take 1 tablet by mouth 2 (two) times daily with a meal.    . Continuous Blood Gluc Sensor (FREESTYLE LIBRE 14 DAY SENSOR) MISC Inject 1 each into the skin every 14 (fourteen) days. 2 each 5  . glucose blood test strip Use as instructed to test blood sugar 3 times daily E11.65 270 each 1  . hydrochlorothiazide (MICROZIDE) 12.5 MG capsule Take 1 capsule (12.5 mg total) by mouth daily. 30 capsule 1  . insulin glargine (LANTUS SOLOSTAR) 100 UNIT/ML Solostar Pen INJECT 16 UNITS UNDER THE SKIN ONCE DAILY. (UPDATED DOSAGE) 15 mL 1  . NOVOLOG FLEXPEN 100 UNIT/ML FlexPen INJECT 8 UNITS UNDER THE SKIN BEFORE BREAKFAST AND 6 UNITS BEFORE DINNER. 15 mL 3  . pioglitazone (ACTOS) 30 MG tablet Take 1 tablet (30 mg total) by mouth daily. 90 tablet 0  . rosuvastatin (CRESTOR) 40 MG tablet TAKE 1 TABLET EVERY DAY (Patient not taking: Reported on 11/15/2020) 90 tablet 1  . Semaglutide,0.25 or 0.5MG /DOS, (OZEMPIC, 0.25 OR 0.5 MG/DOSE,) 2 MG/1.5ML SOPN Inject 0.5 mg into the skin once a week. (Patient not taking: Reported on 11/15/2020) 1.5 mL 1  . Travoprost, BAK Free, (TRAVATAN) 0.004 % SOLN ophthalmic solution Place 1 drop into both eyes at bedtime. 2.5 mL 1   No  current facility-administered medications for this visit.    Allergies: Patient has no known allergies.  Past Medical History:  Diagnosis Date  . Diabetes mellitus without complication ([place])   . Glaucoma   . History of chicken pox   . Hypertension     Past Surgical History:  Procedure Laterality Date  . BREAST BIOPSY Right 2017   benign  . CATARACT EXTRACTION, BILATERAL Bilateral   . CESAREAN SECTION    . COLONOSCOPY  1980's  . TUBAL LIGATION      Family History  Problem Relation Age of Onset  . Hypertension Mother   . Diabetes Brother   . CAD Brother   . Hypertension Brother   . Diabetes Brother   . Hypertension Brother   . Colon cancer Neg Hx   . Esophageal cancer Neg Hx   . Pancreatic cancer Neg Hx   . Liver disease Neg Hx   . Stomach cancer Neg Hx   . Rectal cancer Neg Hx   . Colon polyps Neg Hx     Social History   Tobacco Use  . Smoking status: Never Smoker  . Smokeless tobacco: Never Used  Substance Use Topics  . Alcohol use: No    Subjective:   I connected with Mariah Peterson on 11/29/20 at  8:40 AM EST by a telephone call and verified that I am speaking  with the correct person using two identifiers.   I discussed the limitations of evaluation and management by telemedicine and the availability of in person appointments. The patient expressed understanding and agreed to proceed. Provider in office/ patient is at home; provider and patient are only 2 people on telephone call.   Complaining of persisting cough/ congestion x 3-4 weeks; notes that "she feels drainage in the back of her throat." No fever, chest pain or shortness of breath; no concerns for COVID exposure;    Objective:  There were no vitals filed for this visit.  Lungs: Respirations unlabored;  Neurologic: Alert and oriented; speech intact;   Assessment:  1. Acute bronchitis, unspecified organism     Plan:  Rx for Z-pak #1 take as directed; Rx for Tessalon Perles tid prn; increase  fluids, rest and follow-up worse, no better; will need to consider CXR if symptoms persist.   Time spent 10 minutes  No follow-ups on file.  No orders of the defined types were placed in this encounter.   Requested Prescriptions   Signed Prescriptions Disp Refills  . azithromycin (ZITHROMAX) 250 MG tablet 6 tablet 0    Sig: 2 tabs po qd x 1 day; 1 tablet per day x 4 days;  . benzonatate (TESSALON) 100 MG capsule 20 capsule 0    Sig: Take 1 capsule (100 mg total) by mouth 3 (three) times daily as needed.

## 2020-11-29 NOTE — ED Notes (Signed)
Patient remains at CT

## 2020-11-29 NOTE — Progress Notes (Addendum)
ANTICOAGULATION CONSULT NOTE - Initial Consult  Pharmacy Consult for Heparin > Lovenox Indication: chest pain/ACS  No Known Allergies  Patient Measurements: Height: 4\' 11"  (149.9 cm) Weight: 39.9 kg (88 lb) IBW/kg (Calculated) : 43.2 Heparin Dosing Weight: 39.9 kg  Vital Signs: Temp: 99 F (37.2 C) (02/08 1635) Temp Source: Oral (02/08 1635) BP: 142/73 (02/08 2000) Pulse Rate: 75 (02/08 2000)  Labs: Recent Labs    11/29/20 1638 11/29/20 1923  HGB 11.2*  --   HCT 32.4*  --   PLT 237  --   CREATININE 1.85*  --   TROPONINIHS 23* 188*    Estimated Creatinine Clearance: 17.1 mL/min (A) (by C-G formula based on SCr of 1.85 mg/dL (H)).   Medical History: Past Medical History:  Diagnosis Date  . Diabetes mellitus without complication ([place])   . Glaucoma   . History of chicken pox   . Hypertension      Assessment: 75 yo F presents with chest pain. Pharmacy asked to start IV heparin. No AC noted PTA. Hgb 11.2 around baseline, pltc ok 237.   Goal of Therapy:  Heparin level 0.3-0.7 units/ml Monitor platelets by anticoagulation protocol: Yes   Plan:  Give 2000 unit IV heparin bolus Start heparin infusion at 500 units/hr Check 8-hr HL Monitor daily HL, CBC, s/sx bleeding  Richardine Service, PharmD, BCPS PGY2 Cardiology Pharmacy Resident Phone: [phone] 11/29/2020  8:43 PM  Please check [email or web address] for unit-specific pharmacy phone numbers. ======================================= Addendum: Pharmacy asked to switch IV heparin to enoxaparin. IV heparin has not been started yet. Scr 1.85 with CrCl 17.1 ml/min.  Plan: Start Enoxaparin 40 mg daily Check LMWH at steady state Monitor daily CBC, s/sx bleeding

## 2020-11-30 ENCOUNTER — Inpatient Hospital Stay (HOSPITAL_COMMUNITY)
Admission: EM | Disposition: A | Discharge status: Home / Self Care | Payer: Self-pay | Source: Home / Self Care | Attending: Thoracic Surgery (Cardiothoracic Vascular Surgery)

## 2020-11-30 ENCOUNTER — Encounter (HOSPITAL_COMMUNITY): Payer: Self-pay | Admitting: Internal Medicine

## 2020-11-30 ENCOUNTER — Observation Stay (HOSPITAL_BASED_OUTPATIENT_CLINIC_OR_DEPARTMENT_OTHER): Payer: Medicare Other

## 2020-11-30 DIAGNOSIS — I10 Essential (primary) hypertension: Secondary | ICD-10-CM

## 2020-11-30 DIAGNOSIS — I2582 Chronic total occlusion of coronary artery: Secondary | ICD-10-CM | POA: Diagnosis present

## 2020-11-30 DIAGNOSIS — Z79899 Other long term (current) drug therapy: Secondary | ICD-10-CM | POA: Diagnosis not present

## 2020-11-30 DIAGNOSIS — E785 Hyperlipidemia, unspecified: Secondary | ICD-10-CM | POA: Diagnosis present

## 2020-11-30 DIAGNOSIS — I959 Hypotension, unspecified: Secondary | ICD-10-CM | POA: Diagnosis not present

## 2020-11-30 DIAGNOSIS — I4510 Unspecified right bundle-branch block: Secondary | ICD-10-CM | POA: Diagnosis present

## 2020-11-30 DIAGNOSIS — Z681 Body mass index (BMI) 19 or less, adult: Secondary | ICD-10-CM | POA: Diagnosis not present

## 2020-11-30 DIAGNOSIS — Z20822 Contact with and (suspected) exposure to covid-19: Secondary | ICD-10-CM | POA: Diagnosis present

## 2020-11-30 DIAGNOSIS — H409 Unspecified glaucoma: Secondary | ICD-10-CM | POA: Diagnosis present

## 2020-11-30 DIAGNOSIS — I214 Non-ST elevation (NSTEMI) myocardial infarction: Secondary | ICD-10-CM | POA: Diagnosis present

## 2020-11-30 DIAGNOSIS — Z8249 Family history of ischemic heart disease and other diseases of the circulatory system: Secondary | ICD-10-CM | POA: Diagnosis not present

## 2020-11-30 DIAGNOSIS — N1832 Chronic kidney disease, stage 3b: Secondary | ICD-10-CM | POA: Diagnosis present

## 2020-11-30 DIAGNOSIS — K08109 Complete loss of teeth, unspecified cause, unspecified class: Secondary | ICD-10-CM | POA: Diagnosis present

## 2020-11-30 DIAGNOSIS — E1165 Type 2 diabetes mellitus with hyperglycemia: Secondary | ICD-10-CM | POA: Diagnosis present

## 2020-11-30 DIAGNOSIS — D62 Acute posthemorrhagic anemia: Secondary | ICD-10-CM | POA: Diagnosis not present

## 2020-11-30 DIAGNOSIS — Z794 Long term (current) use of insulin: Secondary | ICD-10-CM | POA: Diagnosis not present

## 2020-11-30 DIAGNOSIS — I129 Hypertensive chronic kidney disease with stage 1 through stage 4 chronic kidney disease, or unspecified chronic kidney disease: Secondary | ICD-10-CM | POA: Diagnosis present

## 2020-11-30 DIAGNOSIS — I25110 Atherosclerotic heart disease of native coronary artery with unstable angina pectoris: Secondary | ICD-10-CM | POA: Diagnosis not present

## 2020-11-30 DIAGNOSIS — Z833 Family history of diabetes mellitus: Secondary | ICD-10-CM | POA: Diagnosis not present

## 2020-11-30 DIAGNOSIS — E119 Type 2 diabetes mellitus without complications: Secondary | ICD-10-CM

## 2020-11-30 DIAGNOSIS — E1122 Type 2 diabetes mellitus with diabetic chronic kidney disease: Secondary | ICD-10-CM | POA: Diagnosis present

## 2020-11-30 DIAGNOSIS — R52 Pain, unspecified: Secondary | ICD-10-CM | POA: Diagnosis present

## 2020-11-30 DIAGNOSIS — E440 Moderate protein-calorie malnutrition: Secondary | ICD-10-CM | POA: Diagnosis present

## 2020-11-30 DIAGNOSIS — I2510 Atherosclerotic heart disease of native coronary artery without angina pectoris: Secondary | ICD-10-CM | POA: Diagnosis present

## 2020-11-30 DIAGNOSIS — E8770 Fluid overload, unspecified: Secondary | ICD-10-CM | POA: Diagnosis not present

## 2020-11-30 DIAGNOSIS — Z01810 Encounter for preprocedural cardiovascular examination: Secondary | ICD-10-CM | POA: Diagnosis not present

## 2020-11-30 DIAGNOSIS — E1169 Type 2 diabetes mellitus with other specified complication: Secondary | ICD-10-CM | POA: Diagnosis not present

## 2020-11-30 DIAGNOSIS — Z9851 Tubal ligation status: Secondary | ICD-10-CM | POA: Diagnosis not present

## 2020-11-30 DIAGNOSIS — R079 Chest pain, unspecified: Secondary | ICD-10-CM

## 2020-11-30 HISTORY — PX: LEFT HEART CATH AND CORONARY ANGIOGRAPHY: CATH118249

## 2020-11-30 HISTORY — DX: Chest pain, unspecified: R07.9

## 2020-11-30 LAB — LIPID PANEL
Cholesterol: 302 mg/dL — ABNORMAL HIGH (ref 0–200)
HDL: 54 mg/dL (ref >40)
LDL Cholesterol: 226 mg/dL — ABNORMAL HIGH (ref 0–99)
Total CHOL/HDL Ratio: 5.6 RATIO
Triglycerides: 108 mg/dL (ref <150)
VLDL: 22 mg/dL (ref 0–40)

## 2020-11-30 LAB — CBC
HCT: 31.8 % — ABNORMAL LOW (ref 36.0–46.0)
Hemoglobin: 9.9 g/dL — ABNORMAL LOW (ref 12.0–15.0)
MCH: 29.6 pg (ref 26.0–34.0)
MCHC: 31.1 g/dL (ref 30.0–36.0)
MCV: 95.2 fL (ref 80.0–100.0)
Platelets: 214 10*3/uL (ref 150–400)
RBC: 3.34 MIL/uL — ABNORMAL LOW (ref 3.87–5.11)
RDW: 13.2 % (ref 11.5–15.5)
WBC: 4.8 10*3/uL (ref 4.0–10.5)
nRBC: 0 % (ref 0.0–0.2)

## 2020-11-30 LAB — BASIC METABOLIC PANEL
Anion gap: 11 (ref 5–15)
BUN: 35 mg/dL — ABNORMAL HIGH (ref 8–23)
CO2: 22 mmol/L (ref 22–32)
Calcium: 9.6 mg/dL (ref 8.9–10.3)
Chloride: 104 mmol/L (ref 98–111)
Creatinine, Ser: 1.58 mg/dL — ABNORMAL HIGH (ref 0.44–1.00)
GFR, Estimated: 34 mL/min — ABNORMAL LOW (ref >60)
Glucose, Bld: 124 mg/dL — ABNORMAL HIGH (ref 70–99)
Potassium: 3.6 mmol/L (ref 3.5–5.1)
Sodium: 137 mmol/L (ref 135–145)

## 2020-11-30 LAB — ECHOCARDIOGRAM COMPLETE
Area-P 1/2: 2.2 cm2
Height: 59 in
S' Lateral: 2.8 cm
Weight: 1408 oz

## 2020-11-30 LAB — TROPONIN I (HIGH SENSITIVITY)
Troponin I (High Sensitivity): 3858 ng/L (ref <18)
Troponin I (High Sensitivity): 5267 ng/L (ref <18)

## 2020-11-30 LAB — CBG MONITORING, ED
Glucose-Capillary: 229 mg/dL — ABNORMAL HIGH (ref 70–99)
Glucose-Capillary: 70 mg/dL (ref 70–99)

## 2020-11-30 LAB — GLUCOSE, CAPILLARY
Glucose-Capillary: 100 mg/dL — ABNORMAL HIGH (ref 70–99)
Glucose-Capillary: 102 mg/dL — ABNORMAL HIGH (ref 70–99)

## 2020-11-30 SURGERY — LEFT HEART CATH AND CORONARY ANGIOGRAPHY
Anesthesia: LOCAL

## 2020-11-30 MED ORDER — AZITHROMYCIN 250 MG PO TABS: 500.0000 mg | ORAL_TABLET | Freq: Every day | ORAL | Status: AC

## 2020-11-30 MED ORDER — SODIUM CHLORIDE 0.9% FLUSH
3.0000 mL | Freq: Two times a day (BID) | INTRAVENOUS | Status: DC
  Administered 2020-12-04: 3 mL via INTRAVENOUS

## 2020-11-30 MED ORDER — SODIUM CHLORIDE 0.9% FLUSH: 3.0000 mL | INTRAVENOUS | Status: DC | PRN

## 2020-11-30 MED ORDER — SODIUM CHLORIDE 0.9 % IV SOLN: INTRAVENOUS | Status: DC

## 2020-11-30 MED ORDER — BENZONATATE 100 MG PO CAPS
100.0000 mg | ORAL_CAPSULE | Freq: Three times a day (TID) | ORAL | Status: DC | PRN
  Administered 2020-12-02 – 2020-12-03 (×2): 100 mg via ORAL
  Filled 2020-11-30 (×2): qty 1

## 2020-11-30 MED ORDER — HEPARIN (PORCINE) IN NACL 1000-0.9 UT/500ML-% IV SOLN
INTRAVENOUS | Status: AC
  Filled 2020-11-30: qty 1500

## 2020-11-30 MED ORDER — ASPIRIN 81 MG PO CHEW: 81.0000 mg | CHEWABLE_TABLET | ORAL | Status: DC

## 2020-11-30 MED ORDER — SODIUM CHLORIDE 0.9 % WEIGHT BASED INFUSION
1.0000 mL/kg/h | INTRAVENOUS | Status: AC
  Administered 2020-11-30: 1 mL/kg/h via INTRAVENOUS

## 2020-11-30 MED ORDER — FENTANYL CITRATE (PF) 100 MCG/2ML IJ SOLN
INTRAMUSCULAR | Status: DC | PRN
  Administered 2020-11-30: 25 ug via INTRAVENOUS

## 2020-11-30 MED ORDER — SODIUM CHLORIDE 0.9 % IV SOLN: 250.0000 mL | INTRAVENOUS | Status: DC | PRN

## 2020-11-30 MED ORDER — ACETAMINOPHEN 325 MG PO TABS: 650.0000 mg | ORAL_TABLET | ORAL | Status: DC | PRN

## 2020-11-30 MED ORDER — LABETALOL HCL 5 MG/ML IV SOLN: 10.0000 mg | INTRAVENOUS | Status: AC | PRN

## 2020-11-30 MED ORDER — INSULIN GLARGINE 100 UNIT/ML SOLOSTAR PEN: 16.0000 [IU] | PEN_INJECTOR | Freq: Two times a day (BID) | SUBCUTANEOUS | Status: DC

## 2020-11-30 MED ORDER — MIDAZOLAM HCL 2 MG/2ML IJ SOLN
INTRAMUSCULAR | Status: DC | PRN
  Administered 2020-11-30: 1 mg via INTRAVENOUS

## 2020-11-30 MED ORDER — ASPIRIN 81 MG PO CHEW
81.0000 mg | CHEWABLE_TABLET | ORAL | Status: AC
  Administered 2020-11-30: 81 mg via ORAL
  Filled 2020-11-30: qty 1

## 2020-11-30 MED ORDER — MIDAZOLAM HCL 2 MG/2ML IJ SOLN
INTRAMUSCULAR | Status: AC
  Filled 2020-11-30: qty 2

## 2020-11-30 MED ORDER — LIDOCAINE HCL (PF) 1 % IJ SOLN
INTRAMUSCULAR | Status: AC
  Filled 2020-11-30: qty 30

## 2020-11-30 MED ORDER — HEPARIN SODIUM (PORCINE) 1000 UNIT/ML IJ SOLN
INTRAMUSCULAR | Status: DC | PRN
  Administered 2020-11-30: 3000 [IU] via INTRAVENOUS

## 2020-11-30 MED ORDER — INSULIN GLARGINE 100 UNIT/ML ~~LOC~~ SOLN
16.0000 [IU] | Freq: Every day | SUBCUTANEOUS | Status: DC
  Administered 2020-12-01 – 2020-12-03 (×3): 16 [IU] via SUBCUTANEOUS
  Filled 2020-11-30 (×4): qty 0.16

## 2020-11-30 MED ORDER — ASPIRIN 81 MG PO CHEW: 81.0000 mg | CHEWABLE_TABLET | Freq: Every day | ORAL | Status: DC

## 2020-11-30 MED ORDER — AZITHROMYCIN 250 MG PO TABS: 250.0000 mg | ORAL_TABLET | Freq: Every day | ORAL | Status: DC

## 2020-11-30 MED ORDER — HEPARIN (PORCINE) IN NACL 1000-0.9 UT/500ML-% IV SOLN
INTRAVENOUS | Status: DC | PRN
  Administered 2020-11-30 (×3): 500 mL

## 2020-11-30 MED ORDER — IOHEXOL 350 MG/ML SOLN
INTRAVENOUS | Status: DC | PRN
  Administered 2020-11-30: 75 mL

## 2020-11-30 MED ORDER — CALCIUM CARBONATE 1250 (500 CA) MG PO TABS
1.0000 | ORAL_TABLET | Freq: Two times a day (BID) | ORAL | Status: DC
  Administered 2020-11-30 – 2020-12-04 (×9): 500 mg via ORAL
  Filled 2020-11-30 (×9): qty 1

## 2020-11-30 MED ORDER — ONDANSETRON HCL 4 MG/2ML IJ SOLN: 4.0000 mg | Freq: Four times a day (QID) | INTRAMUSCULAR | Status: DC | PRN

## 2020-11-30 MED ORDER — FENTANYL CITRATE (PF) 100 MCG/2ML IJ SOLN
INTRAMUSCULAR | Status: AC
  Filled 2020-11-30: qty 2

## 2020-11-30 MED ORDER — INSULIN ASPART 100 UNIT/ML ~~LOC~~ SOLN
0.0000 [IU] | Freq: Every day | SUBCUTANEOUS | Status: DC
  Administered 2020-12-01 – 2020-12-03 (×3): 3 [IU] via SUBCUTANEOUS

## 2020-11-30 MED ORDER — HYDRALAZINE HCL 20 MG/ML IJ SOLN: 10.0000 mg | INTRAMUSCULAR | Status: AC | PRN

## 2020-11-30 MED ORDER — AMLODIPINE BESYLATE 10 MG PO TABS
10.0000 mg | ORAL_TABLET | Freq: Every day | ORAL | Status: DC
  Administered 2020-12-01 – 2020-12-04 (×4): 10 mg via ORAL
  Filled 2020-11-30 (×4): qty 1

## 2020-11-30 MED ORDER — VERAPAMIL HCL 2.5 MG/ML IV SOLN
INTRAVENOUS | Status: AC
  Filled 2020-11-30: qty 2

## 2020-11-30 MED ORDER — HEPARIN SODIUM (PORCINE) 1000 UNIT/ML IJ SOLN
INTRAMUSCULAR | Status: AC
  Filled 2020-11-30: qty 1

## 2020-11-30 MED ORDER — INSULIN ASPART 100 UNIT/ML ~~LOC~~ SOLN
0.0000 [IU] | Freq: Three times a day (TID) | SUBCUTANEOUS | Status: DC
  Administered 2020-11-30: 5 [IU] via SUBCUTANEOUS
  Administered 2020-12-01 – 2020-12-02 (×3): 3 [IU] via SUBCUTANEOUS
  Administered 2020-12-02: 8 [IU] via SUBCUTANEOUS
  Administered 2020-12-02: 5 [IU] via SUBCUTANEOUS
  Administered 2020-12-03: 3 [IU] via SUBCUTANEOUS
  Administered 2020-12-03: 15 [IU] via SUBCUTANEOUS
  Administered 2020-12-04 (×2): 5 [IU] via SUBCUTANEOUS
  Administered 2020-12-04: 2 [IU] via SUBCUTANEOUS

## 2020-11-30 MED ORDER — OXYCODONE HCL 5 MG PO TABS: 5.0000 mg | ORAL_TABLET | ORAL | Status: DC | PRN

## 2020-11-30 MED ORDER — HEPARIN (PORCINE) 25000 UT/250ML-% IV SOLN
600.0000 [IU]/h | INTRAVENOUS | Status: DC
  Administered 2020-12-01: 500 [IU]/h via INTRAVENOUS
  Administered 2020-12-03: 550 [IU]/h via INTRAVENOUS
  Filled 2020-11-30 (×4): qty 250

## 2020-11-30 MED ORDER — ASPIRIN EC 81 MG PO TBEC
81.0000 mg | DELAYED_RELEASE_TABLET | Freq: Every day | ORAL | Status: DC
  Administered 2020-12-01 – 2020-12-04 (×4): 81 mg via ORAL
  Filled 2020-11-30 (×4): qty 1

## 2020-11-30 MED ORDER — SODIUM CHLORIDE 0.9% FLUSH
3.0000 mL | Freq: Two times a day (BID) | INTRAVENOUS | Status: DC
  Administered 2020-11-30: 3 mL via INTRAVENOUS

## 2020-11-30 MED ORDER — LIDOCAINE HCL (PF) 1 % IJ SOLN
INTRAMUSCULAR | Status: DC | PRN
  Administered 2020-11-30: 2 mL

## 2020-11-30 SURGICAL SUPPLY — 13 items
BAG SNAP BAND KOVER 36X36 (MISCELLANEOUS) ×1 IMPLANT
CATH INFINITI 5 FR JL3.5 (CATHETERS) ×1 IMPLANT
CATH INFINITI JR4 5F (CATHETERS) ×1 IMPLANT
COVER DOME SNAP 22 D (MISCELLANEOUS) ×1 IMPLANT
DEVICE RAD TR BAND REGULAR (VASCULAR PRODUCTS) ×1 IMPLANT
GLIDESHEATH SLEND A-KIT 6F 22G (SHEATH) ×1 IMPLANT
GUIDEWIRE INQWIRE 1.5J.035X260 (WIRE) IMPLANT
INQWIRE 1.5J .035X260CM (WIRE) ×2
KIT HEART LEFT (KITS) ×2 IMPLANT
PACK CARDIAC CATHETERIZATION (CUSTOM PROCEDURE TRAY) ×2 IMPLANT
SHEATH PROBE COVER 6X72 (BAG) ×1 IMPLANT
TRANSDUCER W/STOPCOCK (MISCELLANEOUS) ×2 IMPLANT
TUBING CIL FLEX 10 FLL-RA (TUBING) ×2 IMPLANT

## 2020-11-30 NOTE — Interval H&P Note (Signed)
Cath Lab Visit (complete for each Cath Lab visit)  Clinical Evaluation Leading to the Procedure:   ACS: Yes.    Non-ACS:    Anginal Classification: CCS Peterson  Anti-ischemic medical therapy: Minimal Therapy (1 class of medications)  Non-Invasive Test Results: No non-invasive testing performed  Prior CABG: No previous CABG      Cath Lab Visit (complete for each Cath Lab visit)  Clinical Evaluation Leading to the Procedure:   ACS: Yes.    Non-ACS:    Anginal Classification: CCS Peterson  Anti-ischemic medical therapy: Minimal Therapy (1 class of medications)  Non-Invasive Test Results: No non-invasive testing performed  Prior CABG: No previous CABG      History and Physical Interval Note:  11/30/2020 3:43 PM  Mariah Peterson  has presented today for surgery, with the diagnosis of nstemi.  The various methods of treatment have been discussed with the patient and family. After consideration of risks, benefits and other options for treatment, the patient has consented to  Procedure(s): LEFT HEART CATH AND CORONARY ANGIOGRAPHY (N/A) as a surgical intervention.  The patient's history has been reviewed, patient examined, no change in status, stable for surgery.  I have reviewed the patient's chart and labs.  Questions were answered to the patient's satisfaction.     Mariah Peterson

## 2020-11-30 NOTE — Progress Notes (Signed)
[place] for Lovenox >> heparin Indication: chest pain/ACS  No Known Allergies  Patient Measurements: Height: 4\' 11"  (149.9 cm) Weight: 39.9 kg (88 lb) IBW/kg (Calculated) : 43.2 Heparin Dosing Weight: 39.9 kg  Vital Signs: BP: 133/63 (02/09 0800) Pulse Rate: 64 (02/09 0800)  Labs: Recent Labs    11/29/20 1638 11/29/20 1923 11/30/20 0514  HGB 11.2*  --  9.9*  HCT 32.4*  --  31.8*  PLT 237  --  214  CREATININE 1.85*  --   --   TROPONINIHS 23* 188* 3,858*    Estimated Creatinine Clearance: 17.1 mL/min (A) (by C-G formula based on SCr of 1.85 mg/dL (H)).   Medical History: Past Medical History:  Diagnosis Date  . Diabetes mellitus without complication ([place])   . Glaucoma   . History of chicken pox   . Hypertension      Assessment: 75 yo F presents with chest pain, elevated troponin. Pharmacy asked to start Lovenox for NSTEMI. No AC noted PTA. Hg down to 9.9, plt wnl, d-dimer 1.48. SCr 1.85, CrCl<30. No active bleed issues reported.  Cardiology planning cath today. Pharmacy consulted to transition Lovenox to heparin. Patient last received Lovenox treatment dose (40mg  [place] q24h) 2/9 at 0118. Informed Cardiology heparin drip will not begin until next Lovenox dose would be due (2/10 at 0100), meaning drip will likely not be started with patient scheduled for cath today. Cardiology PA aware.  Goal of Therapy:  Heparin level 0.3-0.7 units/ml Monitor platelets by anticoagulation protocol: Yes   Plan:  No bolus with recent Lovenox. Start heparin infusion at 500 units/hr on 2/10 at 0100 (when next Lovenox dose would be due) Check 8-hr heparin level from start Monitor daily CBC, s/sx bleeding F/u Cardiology plans post-cath   Arturo Morton, PharmD, BCPS Please check AMION for all [place] contact numbers Clinical Pharmacist 11/30/2020 9:07 AM

## 2020-11-30 NOTE — Progress Notes (Signed)
Inpatient Diabetes Program Recommendations  AACE/ADA: New Consensus Statement on Inpatient Glycemic Control (2015)  Target Ranges:  Prepandial:   less than 140 mg/dL      Peak postprandial:   less than 180 mg/dL (1-2 hours)      Critically ill patients:  140 - 180 mg/dL   Lab Results  Component Value Date   GLUCAP 70 11/30/2020   HGBA1C 11.8 (H) 11/10/2020    Review of Glycemic Control  Diabetes history: DM 2 Outpatient Diabetes medications: Lantus 18 units, Novolog 12 units breakfast and lunch, Ozempic 0.5 mg QFriday, Actos Daily Current orders for Inpatient glycemic control:  Lantus 16 units Daily, Novolog 0-15 units tid + hs  A1c 11.8 on 1/20 Sees Dr. Dwyane Dee, Endocrinology last visit 1/25 - insulin increased, Ozempic and Actos added  Spoke with pt at bedside. Pt reports she does not remember what her glucose trends are and are not sure if they are better but knows they were 300 yesterday. Unsure if she is checking her glucose regularly. Discussed importance of glucose control.  Thanks, Tama Headings RN, MSN, BC-ADM Inpatient Diabetes Coordinator Team Pager [phone] (8a-5p)

## 2020-11-30 NOTE — ED Notes (Signed)
Pt's blood pressures have been soft; nitro patch has been removed and medication wiped off.

## 2020-11-30 NOTE — CV Procedure (Signed)
   Highly tortuous, heavily calcified left coronary system.  The left main is widely patent.  LAD is very tortuous, there is 60% stenosis proximal to an area of ectasia in the distal third of the vessel followed by a 50 to 70% stenosis after the large region of ectasia.  2 large diagonal branches arise distally.  The left coronary and circumflex form numerous arterial to left ventricular communications.  Large ramus intermedius with 90+ percent stenosis in an angulated segment followed by tortuous vessel.  90% first obtuse marginal within an angulated segment.  Small diameter 90% segmental stenosis in the right coronary.  Left to right collaterals are noted.  Normal left ventricular function and LVEDP.  Recommendation: Anatomy is not ideal for PCI.  Recommend surgical evaluation to treat the PDA, obtuse marginal, ramus intermedius, and possibly the distal LAD and or diagonals with grafting.

## 2020-11-30 NOTE — Progress Notes (Signed)
  Echocardiogram 2D Echocardiogram has been performed.  Fidel Levy 11/30/2020, 11:49 AM

## 2020-11-30 NOTE — Progress Notes (Addendum)
Progress Note  Patient Name: Mariah Peterson Date of Encounter: 11/30/2020  [facility] Cardiologist: New to Dr. Marlou Porch  Subjective   No recurrent chest pain.  Stable.  Denies shortness of breath  Inpatient Medications    Scheduled Meds: . amLODipine  10 mg Oral Daily  . [START ON 12/01/2020] azithromycin  250 mg Oral Daily  . azithromycin  500 mg Oral Daily  . calcium carbonate  1 tablet Oral BID WC  . enoxaparin (LOVENOX) injection  40 mg Subcutaneous Q24H  . insulin aspart  0-15 Units Subcutaneous TID WC  . insulin aspart  0-5 Units Subcutaneous QHS  . insulin glargine  16 Units Subcutaneous Daily  . metoprolol tartrate  12.5 mg Oral BID  . nitroGLYCERIN  1 inch Topical Q6H  . rosuvastatin  40 mg Oral Daily   Continuous Infusions:  PRN Meds: acetaminophen, benzonatate, morphine injection, ondansetron (ZOFRAN) IV   Vital Signs    Vitals:   11/30/20 0000 11/30/20 0245 11/30/20 0345 11/30/20 0500  BP: (!) 115/57 (!) 100/56 (!) 101/56 (!) 122/55  Pulse: 67 76 66 63  Resp: 18 (!) 23 20 17   Temp:      TempSrc:      SpO2: 100% 100% 99% 100%  Weight:      Height:       No intake or output data in the 24 hours ending 11/30/20 0829 Last 3 Weights 11/29/2020 11/15/2020 10/10/2020  Weight (lbs) 88 lb 87 lb 88 lb  Weight (kg) 39.917 kg 39.463 kg 39.917 kg      Telemetry    Sinus rhythm- Personally Reviewed  ECG    No new tracing this morning  Physical Exam   GEN: No acute distress.   Neck: No JVD Cardiac: RRR, no murmurs, rubs, or gallops.  Respiratory: Clear to auscultation bilaterally. GI: Soft, nontender, non-distended  MS: No edema; No deformity. Neuro:  Nonfocal  Psych: Normal affect   Labs    High Sensitivity Troponin:   Recent Labs  Lab 11/29/20 1638 11/29/20 1923 11/30/20 0514  TROPONINIHS 23* 188* 3,858*      Chemistry Recent Labs  Lab 11/29/20 1638 11/29/20 1711  NA 133*  --   K 4.2  --   CL 100  --   CO2 20*  --   GLUCOSE 377*   --   BUN 39*  --   CREATININE 1.85*  --   CALCIUM 9.6  --   PROT  --  6.6  ALBUMIN  --  3.5  AST  --  21  ALT  --  18  ALKPHOS  --  44  BILITOT  --  0.6  GFRNONAA 28*  --   ANIONGAP 13  --      Hematology Recent Labs  Lab 11/29/20 1638 11/30/20 0514  WBC 6.0 4.8  RBC 3.55* 3.34*  HGB 11.2* 9.9*  HCT 32.4* 31.8*  MCV 91.3 95.2  MCH 31.5 29.6  MCHC 34.6 31.1  RDW 13.2 13.2  PLT 237 214    BNPNo results for input(s): BNP, PROBNP in the last 168 hours.   DDimer  Recent Labs  Lab 11/29/20 1923  DDIMER 1.48*     Radiology    CT Chest Wo Contrast  Result Date: 11/29/2020 CLINICAL DATA:  Centralized chest pain and cough which began this morning EXAM: CT CHEST WITHOUT CONTRAST TECHNIQUE: Multidetector CT imaging of the chest was performed following the standard protocol without IV contrast. COMPARISON:  Radiograph 11/29/2020 FINDINGS:  Cardiovascular: Normal cardiac size. Trace pericardial effusion. Three-vessel coronary artery atherosclerosis. Atherosclerotic plaque within the normal caliber aorta. No hyperdense mural thickening or plaque displacement to suggest intramural hematoma. No periaortic stranding or hemorrhage. Central pulmonary arteries are normal caliber. Luminal evaluation precluded in the absence of contrast media. No major venous abnormalities are evident. Mediastinum/Nodes: No mediastinal fluid or gas. Normal thyroid gland and thoracic inlet. No acute abnormality of the trachea or esophagus. No worrisome mediastinal or axillary adenopathy. Hilar nodal evaluation is limited in the absence of intravenous contrast media. Lungs/Pleura: No consolidation, features of edema, pneumothorax, or effusion. No suspicious pulmonary nodules or masses. Few scattered tiny calcified granulomata. Upper Abdomen: No acute abnormalities present in the visualized portions of the upper abdomen. Musculoskeletal: Multilevel degenerative changes are present in the imaged portions of the spine.  No acute osseous abnormality or suspicious osseous lesion. No worrisome chest wall masses or lesions. IMPRESSION: 1. No acute intrathoracic process to provide a cause for patient's symptoms. 2. Sequela of prior granulomatous disease with few scattered calcified granulomata in the lungs. 3. Three-vessel coronary artery atherosclerosis. 4. Aortic Atherosclerosis (ICD10-I70.0). Electronically Signed   By: Lovena Le M.D.   On: 11/29/2020 19:22   DG Chest Port 1 View  Result Date: 11/29/2020 CLINICAL DATA:  Chest pain and shortness of breath EXAM: PORTABLE CHEST 1 VIEW COMPARISON:  None. FINDINGS: The heart size and mediastinal contours are within normal limits. Both lungs are clear. The visualized skeletal structures are unremarkable. IMPRESSION: No active disease. Electronically Signed   By: Prudencio Pair M.D.   On: 11/29/2020 18:59    Cardiac Studies   Pending echocardiogram and cath  Patient Profile     75 y.o. female with DMII (A1c 11.8%), HTN, and HLD who presents for evaluation of chest pain and was found to have an NSTEMI.  Assessment & Plan    1. NSTEMI -Classic anginal symptoms which resolved with sublingual nitroglycerie and morphine.  EKG with dynamic changes.  No repeat EKG this morning. Troponin trending up 23>>188>>3858.  - Start heparin gtt and ASA 81mg  qd (got Lovenox around 1 AM) - Continue metoprolol 12.5mg  BID and Crestor 40mg  qd - The patient understands that risks include but are not limited to stroke (1 in 1000), death (1 in 1000), kidney failure [usually temporary] (1 in 500), bleeding (1 in 200), allergic reaction [possibly serious] (1 in 200), and agrees to proceed.   2. HTN - BP stable on current medications  3. HLD - 11/30/2020: Cholesterol 302; HDL 54; LDL Cholesterol 226; Triglycerides 108; VLDL 22  - Continue Crestor 40mg  qd  4. Uncontrolled DM - HgbA1c was 11.8 on 11/10/20 - Per primary team   For questions or updates, please contact [place] Please  consult [email or web address] for contact info under     Signed, Leanor Kail, PA  11/30/2020, 8:29 AM    Personally seen and examined. Agree with above.   75 year old with non-ST elevation myocardial infarction hypertension hyperlipidemia with uncontrolled diabetes hemoglobin A1c 11.8.  Nitroglycerin paste in place.  Currently chest pain-free.  Troponin however did increase from 188 up to 3000.  EKG shows some dynamic ST segment depression.  On exam she is comfortable in bed, husband at bedside.  Felt as though her discomfort was indigestion originally yesterday.  She is alert and oriented x3.  No appreciable murmurs on exam.  Lungs are clear.  Creatinine yesterday afternoon was 1.85 from 1.55.  She was 1.6-1.8 back in December.  Assessment and plan:  Non-ST elevation myocardial infarction -Cardiac catheterization.  Risk and benefits of been explained including stroke heart attack death renal impairment bleeding.  She is willing to proceed. -IV heparin, aspirin/dual antiplatelet therapy when able. -Check echocardiogram -High intensity statin with goal LDL less than 70. -Improved diabetic control.  CKD stage IIIb -Monitor for signs of worsening renal function.  Hydrating prior to catheterization.  Diabetes with chronic kidney disease -Per primary team.  Consider SGLT2 inhibitor with underlying CAD.  Candee Furbish, MD

## 2020-11-30 NOTE — H&P (View-Only) (Signed)
Progress Note  Patient Name: Mariah Peterson Date of Encounter: 11/30/2020  [facility] Cardiologist: New to Dr. Marlou Porch  Subjective   No recurrent chest pain.  Stable.  Denies shortness of breath  Inpatient Medications    Scheduled Meds: . amLODipine  10 mg Oral Daily  . [START ON 12/01/2020] azithromycin  250 mg Oral Daily  . azithromycin  500 mg Oral Daily  . calcium carbonate  1 tablet Oral BID WC  . enoxaparin (LOVENOX) injection  40 mg Subcutaneous Q24H  . insulin aspart  0-15 Units Subcutaneous TID WC  . insulin aspart  0-5 Units Subcutaneous QHS  . insulin glargine  16 Units Subcutaneous Daily  . metoprolol tartrate  12.5 mg Oral BID  . nitroGLYCERIN  1 inch Topical Q6H  . rosuvastatin  40 mg Oral Daily   Continuous Infusions:  PRN Meds: acetaminophen, benzonatate, morphine injection, ondansetron (ZOFRAN) IV   Vital Signs    Vitals:   11/30/20 0000 11/30/20 0245 11/30/20 0345 11/30/20 0500  BP: (!) 115/57 (!) 100/56 (!) 101/56 (!) 122/55  Pulse: 67 76 66 63  Resp: 18 (!) 23 20 17   Temp:      TempSrc:      SpO2: 100% 100% 99% 100%  Weight:      Height:       No intake or output data in the 24 hours ending 11/30/20 0829 Last 3 Weights 11/29/2020 11/15/2020 10/10/2020  Weight (lbs) 88 lb 87 lb 88 lb  Weight (kg) 39.917 kg 39.463 kg 39.917 kg      Telemetry    Sinus rhythm- Personally Reviewed  ECG    No new tracing this morning  Physical Exam   GEN: No acute distress.   Neck: No JVD Cardiac: RRR, no murmurs, rubs, or gallops.  Respiratory: Clear to auscultation bilaterally. GI: Soft, nontender, non-distended  MS: No edema; No deformity. Neuro:  Nonfocal  Psych: Normal affect   Labs    High Sensitivity Troponin:   Recent Labs  Lab 11/29/20 1638 11/29/20 1923 11/30/20 0514  TROPONINIHS 23* 188* 3,858*      Chemistry Recent Labs  Lab 11/29/20 1638 11/29/20 1711  NA 133*  --   K 4.2  --   CL 100  --   CO2 20*  --   GLUCOSE 377*   --   BUN 39*  --   CREATININE 1.85*  --   CALCIUM 9.6  --   PROT  --  6.6  ALBUMIN  --  3.5  AST  --  21  ALT  --  18  ALKPHOS  --  44  BILITOT  --  0.6  GFRNONAA 28*  --   ANIONGAP 13  --      Hematology Recent Labs  Lab 11/29/20 1638 11/30/20 0514  WBC 6.0 4.8  RBC 3.55* 3.34*  HGB 11.2* 9.9*  HCT 32.4* 31.8*  MCV 91.3 95.2  MCH 31.5 29.6  MCHC 34.6 31.1  RDW 13.2 13.2  PLT 237 214    BNPNo results for input(s): BNP, PROBNP in the last 168 hours.   DDimer  Recent Labs  Lab 11/29/20 1923  DDIMER 1.48*     Radiology    CT Chest Wo Contrast  Result Date: 11/29/2020 CLINICAL DATA:  Centralized chest pain and cough which began this morning EXAM: CT CHEST WITHOUT CONTRAST TECHNIQUE: Multidetector CT imaging of the chest was performed following the standard protocol without IV contrast. COMPARISON:  Radiograph 11/29/2020 FINDINGS:  Cardiovascular: Normal cardiac size. Trace pericardial effusion. Three-vessel coronary artery atherosclerosis. Atherosclerotic plaque within the normal caliber aorta. No hyperdense mural thickening or plaque displacement to suggest intramural hematoma. No periaortic stranding or hemorrhage. Central pulmonary arteries are normal caliber. Luminal evaluation precluded in the absence of contrast media. No major venous abnormalities are evident. Mediastinum/Nodes: No mediastinal fluid or gas. Normal thyroid gland and thoracic inlet. No acute abnormality of the trachea or esophagus. No worrisome mediastinal or axillary adenopathy. Hilar nodal evaluation is limited in the absence of intravenous contrast media. Lungs/Pleura: No consolidation, features of edema, pneumothorax, or effusion. No suspicious pulmonary nodules or masses. Few scattered tiny calcified granulomata. Upper Abdomen: No acute abnormalities present in the visualized portions of the upper abdomen. Musculoskeletal: Multilevel degenerative changes are present in the imaged portions of the spine.  No acute osseous abnormality or suspicious osseous lesion. No worrisome chest wall masses or lesions. IMPRESSION: 1. No acute intrathoracic process to provide a cause for patient's symptoms. 2. Sequela of prior granulomatous disease with few scattered calcified granulomata in the lungs. 3. Three-vessel coronary artery atherosclerosis. 4. Aortic Atherosclerosis (ICD10-I70.0). Electronically Signed   By: Lovena Le M.D.   On: 11/29/2020 19:22   DG Chest Port 1 View  Result Date: 11/29/2020 CLINICAL DATA:  Chest pain and shortness of breath EXAM: PORTABLE CHEST 1 VIEW COMPARISON:  None. FINDINGS: The heart size and mediastinal contours are within normal limits. Both lungs are clear. The visualized skeletal structures are unremarkable. IMPRESSION: No active disease. Electronically Signed   By: Prudencio Pair M.D.   On: 11/29/2020 18:59    Cardiac Studies   Pending echocardiogram and cath  Patient Profile     75 y.o. female with DMII (A1c 11.8%), HTN, and HLD who presents for evaluation of chest pain and was found to have an NSTEMI.  Assessment & Plan    1. NSTEMI -Classic anginal symptoms which resolved with sublingual nitroglycerie and morphine.  EKG with dynamic changes.  No repeat EKG this morning. Troponin trending up 23>>188>>3858.  - Start heparin gtt and ASA 81mg  qd (got Lovenox around 1 AM) - Continue metoprolol 12.5mg  BID and Crestor 40mg  qd - The patient understands that risks include but are not limited to stroke (1 in 1000), death (1 in 1000), kidney failure [usually temporary] (1 in 500), bleeding (1 in 200), allergic reaction [possibly serious] (1 in 200), and agrees to proceed.   2. HTN - BP stable on current medications  3. HLD - 11/30/2020: Cholesterol 302; HDL 54; LDL Cholesterol 226; Triglycerides 108; VLDL 22  - Continue Crestor 40mg  qd  4. Uncontrolled DM - HgbA1c was 11.8 on 11/10/20 - Per primary team   For questions or updates, please contact [place] Please  consult [email or web address] for contact info under     Signed, Leanor Kail, PA  11/30/2020, 8:29 AM    Personally seen and examined. Agree with above.   75 year old with non-ST elevation myocardial infarction hypertension hyperlipidemia with uncontrolled diabetes hemoglobin A1c 11.8.  Nitroglycerin paste in place.  Currently chest pain-free.  Troponin however did increase from 188 up to 3000.  EKG shows some dynamic ST segment depression.  On exam she is comfortable in bed, husband at bedside.  Felt as though her discomfort was indigestion originally yesterday.  She is alert and oriented x3.  No appreciable murmurs on exam.  Lungs are clear.  Creatinine yesterday afternoon was 1.85 from 1.55.  She was 1.6-1.8 back in December.  Assessment and plan:  Non-ST elevation myocardial infarction -Cardiac catheterization.  Risk and benefits of been explained including stroke heart attack death renal impairment bleeding.  She is willing to proceed. -IV heparin, aspirin/dual antiplatelet therapy when able. -Check echocardiogram -High intensity statin with goal LDL less than 70. -Improved diabetic control.  CKD stage IIIb -Monitor for signs of worsening renal function.  Hydrating prior to catheterization.  Diabetes with chronic kidney disease -Per primary team.  Consider SGLT2 inhibitor with underlying CAD.  Candee Furbish, MD

## 2020-11-30 NOTE — Progress Notes (Signed)
PROGRESS NOTE  Mariah Peterson WIO:[id number] DOB: 1946/02/15   PCP: Marrian Salvage, FNP  Patient is from: Home  DOA: 11/29/2020 LOS: 0  Chief complaints: Chest pain  Brief Narrative / Interim history: 75 year old female with history of uncontrolled DM-2, HTN and HLD presenting with substernal chest pain and admitted for non-STEMI. Troponin 23>> 188> 3900. Cardiology consulted and planning LHC.   Subjective: Seen and examined earlier this morning. No major events overnight of this morning. No further chest pain since she had Nitropaste. Denies dyspnea, GI or UTI symptoms. Denies leg swelling or pain. Troponin continues to rise.  Objective: Vitals:   11/30/20 0245 11/30/20 0345 11/30/20 0500 11/30/20 0800  BP: (!) 100/56 (!) 101/56 (!) 122/55 133/63  Pulse: 76 66 63 64  Resp: (!) 23 20 17 14   Temp:      TempSrc:      SpO2: 100% 99% 100% 100%  Weight:      Height:       No intake or output data in the 24 hours ending 11/30/20 1145 Filed Weights   11/29/20 1646  Weight: 39.9 kg    Examination:  GENERAL: No apparent distress.  Nontoxic. HEENT: MMM.  Vision and hearing grossly intact.  NECK: Supple.  No apparent JVD.  RESP: On RA. No IWOB.  Fair aeration bilaterally. CVS:  RRR. Heart sounds normal.  ABD/GI/GU: BS+. Abd soft, NTND.  MSK/EXT:  Moves extremities. No apparent deformity. No edema. No calf tenderness. SKIN: no apparent skin lesion or wound NEURO: Awake, alert and oriented appropriately.  No apparent focal neuro deficit. PSYCH: Calm. Normal affect.  Procedures:  None  Microbiology summarized: COVID-19 PCR nonreactive.  Assessment & Plan: Non-STEMI: She has typical chest pain with dynamic EKG changes and rising troponin. Currently chest pain-free. -Plan for LHC today -Continue heparin drip, aspirin, metoprolol and Crestor -Continue nitro patch -Follow TTE  Uncontrolled IDDM-2 with hyperglycemia: On Lantus, Ozempic and Actos at home. May be  noncompliance. A1c 11.8 from 11.0 about three months prior. Recent Labs  Lab 11/29/20 1637 11/30/20 0751  GLUCAP 355* 229*  -Continue SSI while n.p.o. -meed to her DM well controlled -Hold home Crestor and Ozempic. -Continue statin as above.  Essential hypertension: Normotensive. -Continue home amlodipine -Continue metoprolol-started here.  AKI on CKD-3B/azotemia: Cr as below. Recent Labs    12/28/19 1516 03/29/20 1458 08/15/20 1034 09/23/20 0947 10/10/20 1144 11/10/20 0955 11/29/20 1638  BUN 31* 26* 25* 26* 38* 30* 39*  CREATININE 1.48* 1.28* 1.51* 1.62* 1.88* 1.55* 1.85*  -Hold HCTZ -Agree with IV fluid precath. -Avoid nephrotoxic meds -Recheck in the morning.  Hyperlipidemia: TC 302. HDL 54. LDL 226. -Continue high intensity statin  Elevated D-dimer: Nonspecific but low clinical suspicion for PE or DVT  Cough and congestion: Started on Z-Pak by PCP on the day of admission. She denies respiratory symptoms now. No respiratory distress. -Discontinue Z-Pak  Body mass index is 17.77 kg/m.         DVT prophylaxis:  On heparin drip.  Code Status: Full code Family Communication: Updated patient's husband at bedside. Level of care: Telemetry Cardiac Status is: Observation  The patient will require care spanning > 2 midnights and should be moved to inpatient because: Ongoing diagnostic testing needed not appropriate for outpatient work up, Unsafe d/c plan, IV treatments appropriate due to intensity of illness or inability to take PO and Inpatient level of care appropriate due to severity of illness  Dispo: The patient is from: Home  Anticipated d/c is to: Home              Anticipated d/c date is: 2 days              Patient currently is not medically stable to d/c.   Difficult to place patient No       Consultants:  Cardiology   Sch Meds:  Scheduled Meds: . amLODipine  10 mg Oral Daily  . aspirin  81 mg Oral Pre-Cath  . aspirin EC  81  mg Oral Daily  . [START ON 12/01/2020] azithromycin  250 mg Oral Daily  . azithromycin  500 mg Oral Daily  . calcium carbonate  1 tablet Oral BID WC  . insulin aspart  0-15 Units Subcutaneous TID WC  . insulin aspart  0-5 Units Subcutaneous QHS  . insulin glargine  16 Units Subcutaneous Daily  . metoprolol tartrate  12.5 mg Oral BID  . nitroGLYCERIN  1 inch Topical Q6H  . rosuvastatin  40 mg Oral Daily  . sodium chloride flush  3 mL Intravenous Q12H   Continuous Infusions: . sodium chloride 150 mL/hr at 11/30/20 1115  . [START ON 12/01/2020] heparin     PRN Meds:.acetaminophen, benzonatate, morphine injection, ondansetron (ZOFRAN) IV  Antimicrobials: Anti-infectives (From admission, onward)   Start     Dose/Rate Route Frequency Ordered Stop   12/01/20 1000  azithromycin (ZITHROMAX) tablet 250 mg        250 mg Oral Daily 11/30/20 0819 12/05/20 0959   11/30/20 1000  azithromycin (ZITHROMAX) tablet 500 mg       Note to Pharmacy: 2 tabs po qd x 1 day; 1 tablet per day x 4 days;     500 mg Oral Daily 11/30/20 0327 12/01/20 0959       I have personally reviewed the following labs and images: CBC: Recent Labs  Lab 11/29/20 1638 11/30/20 0514  WBC 6.0 4.8  HGB 11.2* 9.9*  HCT 32.4* 31.8*  MCV 91.3 95.2  PLT 237 214   BMP &GFR Recent Labs  Lab 11/29/20 1638  NA 133*  K 4.2  CL 100  CO2 20*  GLUCOSE 377*  BUN 39*  CREATININE 1.85*  CALCIUM 9.6   Estimated Creatinine Clearance: 17.1 mL/min (A) (by C-G formula based on SCr of 1.85 mg/dL (H)). Liver & Pancreas: Recent Labs  Lab 11/29/20 1711  AST 21  ALT 18  ALKPHOS 44  BILITOT 0.6  PROT 6.6  ALBUMIN 3.5   Recent Labs  Lab 11/29/20 1711  LIPASE 111*   No results for input(s): AMMONIA in the last 168 hours. Diabetic: No results for input(s): HGBA1C in the last 72 hours. Recent Labs  Lab 11/29/20 1637 11/30/20 0751  GLUCAP 355* 229*   Cardiac Enzymes: No results for input(s): CKTOTAL, CKMB, CKMBINDEX,  TROPONINI in the last 168 hours. No results for input(s): PROBNP in the last 8760 hours. Coagulation Profile: No results for input(s): INR, PROTIME in the last 168 hours. Thyroid Function Tests: No results for input(s): TSH, T4TOTAL, FREET4, T3FREE, THYROIDAB in the last 72 hours. Lipid Profile: Recent Labs    11/30/20 0514  CHOL 302*  HDL 54  LDLCALC 226*  TRIG 108  CHOLHDL 5.6   Anemia Panel: No results for input(s): VITAMINB12, FOLATE, FERRITIN, TIBC, IRON, RETICCTPCT in the last 72 hours. Urine analysis:    Component Value Date/Time   COLORURINE YELLOW 06/26/2018 1942   APPEARANCEUR HAZY (A) 06/26/2018 1942   LABSPEC 1.011 06/26/2018 1942  PHURINE 5.0 06/26/2018 1942   GLUCOSEU >=500 (A) 06/26/2018 1942   HGBUR SMALL (A) 06/26/2018 1942   BILIRUBINUR NEGATIVE 06/26/2018 1942   BILIRUBINUR neg 03/22/2014 1025   KETONESUR NEGATIVE 06/26/2018 1942   PROTEINUR NEGATIVE 06/26/2018 1942   UROBILINOGEN 0.2 03/22/2014 1025   UROBILINOGEN 0.2 02/13/2014 0241   NITRITE NEGATIVE 06/26/2018 1942   LEUKOCYTESUR NEGATIVE 06/26/2018 1942   Sepsis Labs: Invalid input(s): PROCALCITONIN, [place]  Microbiology: Recent Results (from the past 240 hour(s))  Resp Panel by RT-PCR (Flu A&B, Covid) Nasopharyngeal Swab     Status: None   Collection Time: 11/29/20  5:13 PM   Specimen: Nasopharyngeal Swab; Nasopharyngeal(NP) swabs in vial transport medium  Result Value Ref Range Status   SARS Coronavirus 2 by RT PCR NEGATIVE NEGATIVE Final    Comment: (NOTE) SARS-CoV-2 target nucleic acids are NOT DETECTED.  The SARS-CoV-2 RNA is generally detectable in upper respiratory specimens during the acute phase of infection. The lowest concentration of SARS-CoV-2 viral copies this assay can detect is 138 copies/mL. A negative result does not preclude SARS-Cov-2 infection and should not be used as the sole basis for treatment or other patient management decisions. A negative result may occur  with  improper specimen collection/handling, submission of specimen other than nasopharyngeal swab, presence of viral mutation(s) within the areas targeted by this assay, and inadequate number of viral copies(<138 copies/mL). A negative result must be combined with clinical observations, patient history, and epidemiological information. The expected result is Negative.  Fact Sheet for Patients:  EntrepreneurPulse.com.au  Fact Sheet for Healthcare Providers:  IncredibleEmployment.be  This test is no t yet approved or cleared by the [place] FDA and  has been authorized for detection and/or diagnosis of SARS-CoV-2 by FDA under an Emergency Use Authorization (EUA). This EUA will remain  in effect (meaning this test can be used) for the duration of the COVID-19 declaration under Section 564(b)(1) of the Act, 21 U.S.C.section 360bbb-3(b)(1), unless the authorization is terminated  or revoked sooner.       Influenza A by PCR NEGATIVE NEGATIVE Final   Influenza B by PCR NEGATIVE NEGATIVE Final    Comment: (NOTE) The Xpert Xpress SARS-CoV-2/FLU/RSV plus assay is intended as an aid in the diagnosis of influenza from Nasopharyngeal swab specimens and should not be used as a sole basis for treatment. Nasal washings and aspirates are unacceptable for Xpert Xpress SARS-CoV-2/FLU/RSV testing.  Fact Sheet for Patients: EntrepreneurPulse.com.au  Fact Sheet for Healthcare Providers: IncredibleEmployment.be  This test is not yet approved or cleared by the [place] FDA and has been authorized for detection and/or diagnosis of SARS-CoV-2 by FDA under an Emergency Use Authorization (EUA). This EUA will remain in effect (meaning this test can be used) for the duration of the COVID-19 declaration under Section 564(b)(1) of the Act, 21 U.S.C. section 360bbb-3(b)(1), unless the authorization is terminated  or revoked.  Performed at [facility], [place] [address], [place], [place] [address]     Radiology Studies: CT Chest Wo Contrast  Result Date: 11/29/2020 CLINICAL DATA:  Centralized chest pain and cough which began this morning EXAM: CT CHEST WITHOUT CONTRAST TECHNIQUE: Multidetector CT imaging of the chest was performed following the standard protocol without IV contrast. COMPARISON:  Radiograph 11/29/2020 FINDINGS: Cardiovascular: Normal cardiac size. Trace pericardial effusion. Three-vessel coronary artery atherosclerosis. Atherosclerotic plaque within the normal caliber aorta. No hyperdense mural thickening or plaque displacement to suggest intramural hematoma. No periaortic stranding or hemorrhage. Central pulmonary arteries are normal  caliber. Luminal evaluation precluded in the absence of contrast media. No major venous abnormalities are evident. Mediastinum/Nodes: No mediastinal fluid or gas. Normal thyroid gland and thoracic inlet. No acute abnormality of the trachea or esophagus. No worrisome mediastinal or axillary adenopathy. Hilar nodal evaluation is limited in the absence of intravenous contrast media. Lungs/Pleura: No consolidation, features of edema, pneumothorax, or effusion. No suspicious pulmonary nodules or masses. Few scattered tiny calcified granulomata. Upper Abdomen: No acute abnormalities present in the visualized portions of the upper abdomen. Musculoskeletal: Multilevel degenerative changes are present in the imaged portions of the spine. No acute osseous abnormality or suspicious osseous lesion. No worrisome chest wall masses or lesions. IMPRESSION: 1. No acute intrathoracic process to provide a cause for patient's symptoms. 2. Sequela of prior granulomatous disease with few scattered calcified granulomata in the lungs. 3. Three-vessel coronary artery atherosclerosis. 4. Aortic Atherosclerosis (ICD10-I70.0). Electronically Signed   By: Lovena Le M.D.   On: 11/29/2020  19:22   DG Chest Port 1 View  Result Date: 11/29/2020 CLINICAL DATA:  Chest pain and shortness of breath EXAM: PORTABLE CHEST 1 VIEW COMPARISON:  None. FINDINGS: The heart size and mediastinal contours are within normal limits. Both lungs are clear. The visualized skeletal structures are unremarkable. IMPRESSION: No active disease. Electronically Signed   By: Prudencio Pair M.D.   On: 11/29/2020 18:59      Taye T. Pinehill  If 7PM-7AM, please contact night-coverage [email or web address] 11/30/2020, 11:45 AM

## 2020-11-30 NOTE — ED Notes (Signed)
NEXT triage Breakfast order placed

## 2020-12-01 ENCOUNTER — Encounter (HOSPITAL_COMMUNITY): Payer: Self-pay | Admitting: Interventional Cardiology

## 2020-12-01 DIAGNOSIS — E119 Type 2 diabetes mellitus without complications: Secondary | ICD-10-CM | POA: Diagnosis not present

## 2020-12-01 DIAGNOSIS — I10 Essential (primary) hypertension: Secondary | ICD-10-CM | POA: Diagnosis not present

## 2020-12-01 DIAGNOSIS — I214 Non-ST elevation (NSTEMI) myocardial infarction: Secondary | ICD-10-CM

## 2020-12-01 DIAGNOSIS — I25110 Atherosclerotic heart disease of native coronary artery with unstable angina pectoris: Secondary | ICD-10-CM

## 2020-12-01 DIAGNOSIS — E1169 Type 2 diabetes mellitus with other specified complication: Secondary | ICD-10-CM | POA: Diagnosis not present

## 2020-12-01 DIAGNOSIS — E1122 Type 2 diabetes mellitus with diabetic chronic kidney disease: Secondary | ICD-10-CM | POA: Diagnosis not present

## 2020-12-01 LAB — GLUCOSE, CAPILLARY
Glucose-Capillary: 92 mg/dL (ref 70–99)
Glucose-Capillary: 167 mg/dL — ABNORMAL HIGH (ref 70–99)
Glucose-Capillary: 162 mg/dL — ABNORMAL HIGH (ref 70–99)
Glucose-Capillary: 278 mg/dL — ABNORMAL HIGH (ref 70–99)

## 2020-12-01 LAB — CBC
HCT: 29.3 % — ABNORMAL LOW (ref 36.0–46.0)
Hemoglobin: 9.4 g/dL — ABNORMAL LOW (ref 12.0–15.0)
MCH: 29.7 pg (ref 26.0–34.0)
MCHC: 32.1 g/dL (ref 30.0–36.0)
MCV: 92.4 fL (ref 80.0–100.0)
Platelets: 201 10*3/uL (ref 150–400)
RBC: 3.17 MIL/uL — ABNORMAL LOW (ref 3.87–5.11)
RDW: 13.4 % (ref 11.5–15.5)
WBC: 5.1 10*3/uL (ref 4.0–10.5)
nRBC: 0 % (ref 0.0–0.2)

## 2020-12-01 LAB — HEPARIN LEVEL (UNFRACTIONATED)
Heparin Unfractionated: 0.35 IU/mL (ref 0.30–0.70)
Heparin Unfractionated: 0.4 IU/mL (ref 0.30–0.70)

## 2020-12-01 LAB — RENAL FUNCTION PANEL
Albumin: 2.6 g/dL — ABNORMAL LOW (ref 3.5–5.0)
Anion gap: 6 (ref 5–15)
BUN: 30 mg/dL — ABNORMAL HIGH (ref 8–23)
CO2: 21 mmol/L — ABNORMAL LOW (ref 22–32)
Calcium: 9.5 mg/dL (ref 8.9–10.3)
Chloride: 110 mmol/L (ref 98–111)
Creatinine, Ser: 1.44 mg/dL — ABNORMAL HIGH (ref 0.44–1.00)
GFR, Estimated: 38 mL/min — ABNORMAL LOW
Glucose, Bld: 98 mg/dL (ref 70–99)
Phosphorus: 4.5 mg/dL (ref 2.5–4.6)
Potassium: 3.9 mmol/L (ref 3.5–5.1)
Sodium: 137 mmol/L (ref 135–145)

## 2020-12-01 LAB — MAGNESIUM: Magnesium: 2 mg/dL (ref 1.7–2.4)

## 2020-12-01 MED FILL — Verapamil HCl IV Soln 2.5 MG/ML: INTRAVENOUS | Qty: 2 | Status: AC

## 2020-12-01 NOTE — Consult Note (Signed)
Reason for Consult:3 vessel CAD s/p non-STEMI Referring Physician: Dr. Rexene Agent is an 75 y.o. female.  HPI: 75 yo woman presented with a cc/o CP  Mariah Peterson is a 77 woman with a past history of type II insulin dependent diabetes, hypertension, hyperlipidemia and glaucoma. She has no prior history of CAD but does have a family history.  She was in her usual state of health until 11/29/20. She developed sudden onset of severe substernal chest tightness with shortness of breath. No radiation. She thought it was indigestion initially but the symptoms persisted and she came to ED. She ruled in for MI with serial enzymes.   Has been pain free since admission.  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 EF was normal and no valvular pathology by echo.  Past Medical History:  Diagnosis Date   Chest pain 11/30/2020   Diabetes mellitus without complication (HCC)    Glaucoma    History of chicken pox    Hypertension     Past Surgical History:  Procedure Laterality Date   BREAST BIOPSY Right 2017   benign   CATARACT EXTRACTION, BILATERAL Bilateral    CESAREAN SECTION     COLONOSCOPY  1980's   LEFT HEART CATH AND CORONARY ANGIOGRAPHY N/A 11/30/2020   Procedure: LEFT HEART CATH AND CORONARY ANGIOGRAPHY;  Surgeon: Belva Crome, MD;  Location: [facility];  Service: Cardiovascular;  Laterality: N/A;   TUBAL LIGATION      Family History  Problem Relation Age of Onset   Hypertension Mother    Diabetes Brother    CAD Brother    Hypertension Brother    Diabetes Brother    Hypertension Brother    Colon cancer Neg Hx    Esophageal cancer Neg Hx    Pancreatic cancer Neg Hx    Liver disease Neg Hx    Stomach cancer Neg Hx    Rectal cancer Neg Hx    Colon polyps Neg Hx     Social History:  reports that she has never smoked. She has never used smokeless tobacco. She reports that she  does not drink alcohol and does not use drugs.  Allergies: No Known Allergies  Medications:  Scheduled:  amLODipine  10 mg Oral Daily   aspirin EC  81 mg Oral Daily   calcium carbonate  1 tablet Oral BID WC   feeding supplement (GLUCERNA SHAKE)  237 mL Oral TID BM   insulin aspart  0-15 Units Subcutaneous TID WC   insulin aspart  0-5 Units Subcutaneous QHS   insulin glargine  16 Units Subcutaneous Daily   loratadine  10 mg Oral Daily   metoprolol tartrate  12.5 mg Oral BID   multivitamin with minerals  1 tablet Oral Daily   nitroGLYCERIN  1 inch Topical Q6H   rosuvastatin  40 mg Oral Daily   sodium chloride flush  3 mL Intravenous Q12H   sodium chloride flush  3 mL Intravenous Q12H    Results for orders placed or performed during the hospital encounter of 11/29/20 (from the past 48 hour(s))  Lipase, blood     Status: Abnormal   Collection Time: 11/29/20  5:11 PM  Result Value Ref Range   Lipase 111 (H) 11 - 51 U/L    Comment: Performed at [facility], [place] [address], [place], [place] [address]  Hepatic function panel     Status: None  Collection Time: 11/29/20  5:11 PM  Result Value Ref Range   Total Protein 6.6 6.5 - 8.1 g/dL   Albumin 3.5 3.5 - 5.0 g/dL   AST 21 15 - 41 U/L   ALT 18 0 - 44 U/L   Alkaline Phosphatase 44 38 - 126 U/L   Total Bilirubin 0.6 0.3 - 1.2 mg/dL   Bilirubin, Direct <0.1 0.0 - 0.2 mg/dL   Indirect Bilirubin NOT CALCULATED 0.3 - 0.9 mg/dL    Comment: Performed at [facility] [address], [place],  [address]  Resp Panel by RT-PCR (Flu A&B, Covid) Nasopharyngeal Swab     Status: None   Collection Time: 11/29/20  5:13 PM   Specimen: Nasopharyngeal Swab; Nasopharyngeal(NP) swabs in vial transport medium  Result Value Ref Range   SARS Coronavirus 2 by RT PCR NEGATIVE NEGATIVE    Comment: (NOTE) SARS-CoV-2 target nucleic acids are NOT DETECTED.  The SARS-CoV-2 RNA is generally detectable in upper  respiratory specimens during the acute phase of infection. The lowest concentration of SARS-CoV-2 viral copies this assay can detect is 138 copies/mL. A negative result does not preclude SARS-Cov-2 infection and should not be used as the sole basis for treatment or other patient management decisions. A negative result may occur with  improper specimen collection/handling, submission of specimen other than nasopharyngeal swab, presence of viral mutation(s) within the areas targeted by this assay, and inadequate number of viral copies(<138 copies/mL). A negative result must be combined with clinical observations, patient history, and epidemiological information. The expected result is Negative.  Fact Sheet for Patients:  EntrepreneurPulse.com.au  Fact Sheet for Healthcare Providers:  IncredibleEmployment.be  This test is no t yet approved or cleared by the [place] FDA and  has been authorized for detection and/or diagnosis of SARS-CoV-2 by FDA under an Emergency Use Authorization (EUA). This EUA will remain  in effect (meaning this test can be used) for the duration of the COVID-19 declaration under Section 564(b)(1) of the Act, 21 U.S.C.section 360bbb-3(b)(1), unless the authorization is terminated  or revoked sooner.       Influenza A by PCR NEGATIVE NEGATIVE   Influenza B by PCR NEGATIVE NEGATIVE    Comment: (NOTE) The Xpert Xpress SARS-CoV-2/FLU/RSV plus assay is intended as an aid in the diagnosis of influenza from Nasopharyngeal swab specimens and should not be used as a sole basis for treatment. Nasal washings and aspirates are unacceptable for Xpert Xpress SARS-CoV-2/FLU/RSV testing.  Fact Sheet for Patients: EntrepreneurPulse.com.au  Fact Sheet for Healthcare Providers: IncredibleEmployment.be  This test is not yet approved or cleared by the [place] FDA and has been authorized for  detection and/or diagnosis of SARS-CoV-2 by FDA under an Emergency Use Authorization (EUA). This EUA will remain in effect (meaning this test can be used) for the duration of the COVID-19 declaration under Section 564(b)(1) of the Act, 21 U.S.C. section 360bbb-3(b)(1), unless the authorization is terminated or revoked.  Performed at [facility], [place] [address] [place], [place] [address]   Troponin I (High Sensitivity)     Status: Abnormal   Collection Time: 11/29/20  7:23 PM  Result Value Ref Range   Troponin I (High Sensitivity) 188 (HH) <18 ng/L    Comment: CRITICAL RESULT CALLED TO, READ BACK BY AND VERIFIED WITH:  C. HARRIS RN @2027  11/29/20 K. SANDERS (NOTE) Elevated high sensitivity troponin I (hsTnI) values and significant  changes across serial measurements may suggest ACS but many other  chronic  and acute conditions are known to elevate hsTnI results.  Refer to the Links section for chest pain algorithms and additional  guidance. Performed at [facility], [place] [address] [place], [place] [address]   D-dimer, quantitative     Status: Abnormal   Collection Time: 11/29/20  7:23 PM  Result Value Ref Range   D-Dimer, Quant 1.48 (H) 0.00 - 0.50 ug/mL-FEU    Comment: (NOTE) At the manufacturer cut-off value of 0.5 g/mL FEU, this assay has a negative predictive value of 95-100%.This assay is intended for use in conjunction with a clinical pretest probability (PTP) assessment model to exclude pulmonary embolism (PE) and deep venous thrombosis (DVT) in outpatients suspected of PE or DVT. Results should be correlated with clinical presentation. Performed at [facility], [place] [address], [place], [place] [address]   CBC     Status: Abnormal   Collection Time: 11/30/20  5:14 AM  Result Value Ref Range   WBC 4.8 4.0 - 10.5 K/uL   RBC 3.34 (L) 3.87 - 5.11 MIL/uL   Hemoglobin 9.9 (L) 12.0 - 15.0 g/dL   HCT 31.8 (L) 36.0 - 46.0 %   MCV 95.2 80.0 - 100.0 fL    MCH 29.6 26.0 - 34.0 pg   MCHC 31.1 30.0 - 36.0 g/dL   RDW 13.2 11.5 - 15.5 %   Platelets 214 150 - 400 K/uL   nRBC 0.0 0.0 - 0.2 %    Comment: Performed at [facility], [place] [address], [place], [place] [address]  Lipid panel     Status: Abnormal   Collection Time: 11/30/20  5:14 AM  Result Value Ref Range   Cholesterol 302 (H) 0 - 200 mg/dL   Triglycerides 108 <150 mg/dL   HDL 54 >40 mg/dL   Total CHOL/HDL Ratio 5.6 RATIO   VLDL 22 0 - 40 mg/dL   LDL Cholesterol 226 (H) 0 - 99 mg/dL    Comment:        Total Cholesterol/HDL:CHD Risk Coronary Heart Disease Risk Table                     Men   Women  1/2 Average Risk   3.4   3.3  Average Risk       5.0   4.4  2 X Average Risk   9.6   7.1  3 X Average Risk  23.4   11.0        Use the calculated Patient Ratio above and the CHD Risk Table to determine the patient's CHD Risk.        ATP III CLASSIFICATION (LDL):  <100     mg/dL   Optimal  100-129  mg/dL   Near or Above                    Optimal  130-159  mg/dL   Borderline  160-189  mg/dL   High  >190     mg/dL   Very High Performed at [facility] [address], [place], [place] [address]   Troponin I (High Sensitivity)     Status: Abnormal   Collection Time: 11/30/20  5:14 AM  Result Value Ref Range   Troponin I (High Sensitivity) 3,858 (HH) <18 ng/L    Comment: CRITICAL VALUE NOTED.  VALUE IS CONSISTENT WITH PREVIOUSLY REPORTED AND CALLED VALUE. (NOTE) Elevated high sensitivity troponin I (hsTnI) values and significant  changes across serial  measurements may suggest ACS but many other  chronic and acute conditions are known to elevate hsTnI results.  Refer to the Links section for chest pain algorithms and additional  guidance. Performed at [facility], [place] [address] [place], [place] [address]   CBG monitoring, ED     Status: Abnormal   Collection Time: 11/30/20  7:51 AM  Result Value Ref Range   Glucose-Capillary 229 (H) 70 - 99 mg/dL     Comment: Glucose reference range applies only to samples taken after fasting for at least 8 hours.  Troponin I (High Sensitivity)     Status: Abnormal   Collection Time: 11/30/20  9:10 AM  Result Value Ref Range   Troponin I (High Sensitivity) 5,267 (HH) <18 ng/L    Comment: CRITICAL VALUE NOTED.  VALUE IS CONSISTENT WITH PREVIOUSLY REPORTED AND CALLED VALUE. (NOTE) Elevated high sensitivity troponin I (hsTnI) values and significant  changes across serial measurements may suggest ACS but many other  chronic and acute conditions are known to elevate hsTnI results.  Refer to the Links section for chest pain algorithms and additional  guidance. Performed at [facility], [place] [address], [place], [place] [address]   Basic metabolic panel     Status: Abnormal   Collection Time: 11/30/20  9:10 AM  Result Value Ref Range   Sodium 137 135 - 145 mmol/L   Potassium 3.6 3.5 - 5.1 mmol/L   Chloride 104 98 - 111 mmol/L   CO2 22 22 - 32 mmol/L   Glucose, Bld 124 (H) 70 - 99 mg/dL    Comment: Glucose reference range applies only to samples taken after fasting for at least 8 hours.   BUN 35 (H) 8 - 23 mg/dL   Creatinine, Ser 1.58 (H) 0.44 - 1.00 mg/dL   Calcium 9.6 8.9 - 10.3 mg/dL   GFR, Estimated 34 (L) >60 mL/min    Comment: (NOTE) Calculated using the CKD-EPI Creatinine Equation (2021)    Anion gap 11 5 - 15    Comment: Performed at [facility] [address] [place], [place] [address]  CBG monitoring, ED     Status: None   Collection Time: 11/30/20 12:24 PM  Result Value Ref Range   Glucose-Capillary 70 70 - 99 mg/dL    Comment: Glucose reference range applies only to samples taken after fasting for at least 8 hours.  Glucose, capillary     Status: Abnormal   Collection Time: 11/30/20  4:35 PM  Result Value Ref Range   Glucose-Capillary 100 (H) 70 - 99 mg/dL    Comment: Glucose reference range applies only to samples taken after fasting for at least 8 hours.  Glucose,  capillary     Status: Abnormal   Collection Time: 11/30/20  8:59 PM  Result Value Ref Range   Glucose-Capillary 102 (H) 70 - 99 mg/dL    Comment: Glucose reference range applies only to samples taken after fasting for at least 8 hours.  CBC     Status: Abnormal   Collection Time: 12/01/20  1:24 AM  Result Value Ref Range   WBC 5.1 4.0 - 10.5 K/uL   RBC 3.17 (L) 3.87 - 5.11 MIL/uL   Hemoglobin 9.4 (L) 12.0 - 15.0 g/dL   HCT 29.3 (L) 36.0 - 46.0 %   MCV 92.4 80.0 - 100.0 fL   MCH 29.7 26.0 - 34.0 pg   MCHC 32.1 30.0 - 36.0 g/dL   RDW 13.4  11.5 - 15.5 %   Platelets 201 150 - 400 K/uL   nRBC 0.0 0.0 - 0.2 %    Comment: Performed at [facility], [place] [address], [place], [place] [address]  Renal function panel     Status: Abnormal   Collection Time: 12/01/20  1:24 AM  Result Value Ref Range   Sodium 137 135 - 145 mmol/L   Potassium 3.9 3.5 - 5.1 mmol/L   Chloride 110 98 - 111 mmol/L   CO2 21 (L) 22 - 32 mmol/L   Glucose, Bld 98 70 - 99 mg/dL    Comment: Glucose reference range applies only to samples taken after fasting for at least 8 hours.   BUN 30 (H) 8 - 23 mg/dL   Creatinine, Ser 1.44 (H) 0.44 - 1.00 mg/dL   Calcium 9.5 8.9 - 10.3 mg/dL   Phosphorus 4.5 2.5 - 4.6 mg/dL   Albumin 2.6 (L) 3.5 - 5.0 g/dL   GFR, Estimated 38 (L) >60 mL/min    Comment: (NOTE) Calculated using the CKD-EPI Creatinine Equation (2021)    Anion gap 6 5 - 15    Comment: Performed at [facility] [address], [place], [place] [address]  Magnesium     Status: None   Collection Time: 12/01/20  1:24 AM  Result Value Ref Range   Magnesium 2.0 1.7 - 2.4 mg/dL    Comment: Performed at [facility] [address], [place], [place] [address]  Glucose, capillary     Status: None   Collection Time: 12/01/20  7:44 AM  Result Value Ref Range   Glucose-Capillary 92 70 - 99 mg/dL    Comment: Glucose reference range applies only to samples taken after fasting for at least 8 hours.   Heparin level (unfractionated)     Status: None   Collection Time: 12/01/20  9:28 AM  Result Value Ref Range   Heparin Unfractionated 0.35 0.30 - 0.70 IU/mL    Comment: (NOTE) If heparin results are below expected values, and patient dosage has  been confirmed, suggest follow up testing of antithrombin III levels. Performed at [facility], [place] [address], [place], [place] [address]   Glucose, capillary     Status: Abnormal   Collection Time: 12/01/20 12:09 PM  Result Value Ref Range   Glucose-Capillary 167 (H) 70 - 99 mg/dL    Comment: Glucose reference range applies only to samples taken after fasting for at least 8 hours.    CT Chest Wo Contrast  Result Date: 11/29/2020 CLINICAL DATA:  Centralized chest pain and cough which began this morning EXAM: CT CHEST WITHOUT CONTRAST TECHNIQUE: Multidetector CT imaging of the chest was performed following the standard protocol without IV contrast. COMPARISON:  Radiograph 11/29/2020 FINDINGS: Cardiovascular: Normal cardiac size. Trace pericardial effusion. Three-vessel coronary artery atherosclerosis. Atherosclerotic plaque within the normal caliber aorta. No hyperdense mural thickening or plaque displacement to suggest intramural hematoma. No periaortic stranding or hemorrhage. Central pulmonary arteries are normal caliber. Luminal evaluation precluded in the absence of contrast media. No major venous abnormalities are evident. Mediastinum/Nodes: No mediastinal fluid or gas. Normal thyroid gland and thoracic inlet. No acute abnormality of the trachea or esophagus. No worrisome mediastinal or axillary adenopathy. Hilar nodal evaluation is limited in the absence of intravenous contrast media. Lungs/Pleura: No consolidation, features of edema, pneumothorax, or effusion. No suspicious pulmonary nodules or masses. Few scattered tiny calcified granulomata. Upper Abdomen: No acute abnormalities present in the visualized  portions of the upper abdomen.  Musculoskeletal: Multilevel degenerative changes are present in the imaged portions of the spine. No acute osseous abnormality or suspicious osseous lesion. No worrisome chest wall masses or lesions. IMPRESSION: 1. No acute intrathoracic process to provide a cause for patient's symptoms. 2. Sequela of prior granulomatous disease with few scattered calcified granulomata in the lungs. 3. Three-vessel coronary artery atherosclerosis. 4. Aortic Atherosclerosis (ICD10-I70.0). Electronically Signed   By: Lovena Le M.D.   On: 11/29/2020 19:22   CARDIAC CATHETERIZATION  Result Date: 11/30/2020  Widely patent left main  Heavily calcified left main, and LAD.  LAD contains significant tortuosity.  In the distal third there is 50% narrowing followed by an area of ectasia and then 70% narrowing near the apex.  Beyond this 3 moderate-sized diagonals are arise.  Circumflex gives origin to 3 obtuse marginals.  The first is moderate in size and contains a region of angulation in which there is a 90% stenosis.  A large ramus intermedius is tortuous and contains proximal 95% stenosis.  The stenosis is within an acute bend.  Right coronary is diffusely diseased proximally with 90% stenosis.  The vessel is small in diameter.  PDA is totally occluded and fills by right to right and left-to-right collaterals.  Normal LV function.  Numerous left ventricular AV fistula a cause left ventricular filling during left coronary injection. RECOMMENDATIONS:  Calcific and ectatic coronary disease with marked tortuosity making any PCI procedure very difficult.  The LAD does not contain significant proximal disease.  There is moderate distal disease.  Would recommend consideration of coronary bypass grafting for ramus, circumflex, PDA, and distal LAD.  If this is not possible, perhaps he ramus intermedius could be stented although will be difficult due to tortuosity and calcification.  DG Chest Port 1 View  Result Date:  11/29/2020 CLINICAL DATA:  Chest pain and shortness of breath EXAM: PORTABLE CHEST 1 VIEW COMPARISON:  None. FINDINGS: The heart size and mediastinal contours are within normal limits. Both lungs are clear. The visualized skeletal structures are unremarkable. IMPRESSION: No active disease. Electronically Signed   By: Prudencio Pair M.D.   On: 11/29/2020 18:59   ECHOCARDIOGRAM COMPLETE  Result Date: 11/30/2020    ECHOCARDIOGRAM REPORT   Patient Name:   NAKAYLA RORABAUGH Date of Exam: 11/30/2020 Medical Rec #:  [id number]     Height:       59.0 in Accession #:    [id number]    Weight:       88.0 lb Date of Birth:  12-15-1945     BSA:          1.301 m Patient Age:    20 years      BP:           133/63 mmHg Patient Gender: F             HR:           72 bpm. Exam Location:  Inpatient Procedure: 2D Echo, Color Doppler and Cardiac Doppler Indications:    NSTEMI I21.4  History:        Patient has no prior history of Echocardiogram examinations.                 Risk Factors:Hypertension and Diabetes.  Sonographer:    Bernadene Person RDCS Referring Phys: [id number] [place]  1. Left ventricular ejection fraction, by estimation, is 55 to 60%. The left ventricle has normal function. The left ventricle has no  regional wall motion abnormalities. Left ventricular diastolic parameters were normal.  2. Right ventricular systolic function is normal. The right ventricular size is normal.  3. The mitral valve is normal in structure. Trivial mitral valve regurgitation. No evidence of mitral stenosis.  4. The aortic valve is normal in structure. Aortic valve regurgitation is not visualized. No aortic stenosis is present.  5. The inferior vena cava is normal in size with greater than 50% respiratory variability, suggesting right atrial pressure of 3 mmHg. FINDINGS  Left Ventricle: Left ventricular ejection fraction, by estimation, is 55 to 60%. The left ventricle has normal function. The left ventricle has no regional wall  motion abnormalities. The left ventricular internal cavity size was normal in size. There is  no left ventricular hypertrophy. Left ventricular diastolic parameters were normal. Right Ventricle: The right ventricular size is normal. No increase in right ventricular wall thickness. Right ventricular systolic function is normal. Left Atrium: Left atrial size was normal in size. Right Atrium: Right atrial size was normal in size. Pericardium: There is no evidence of pericardial effusion. Mitral Valve: The mitral valve is normal in structure. There is mild thickening of the mitral valve leaflet(s). Mild mitral annular calcification. Trivial mitral valve regurgitation. No evidence of mitral valve stenosis. Tricuspid Valve: The tricuspid valve is normal in structure. Tricuspid valve regurgitation is trivial. No evidence of tricuspid stenosis. Aortic Valve: The aortic valve is normal in structure. Aortic valve regurgitation is not visualized. No aortic stenosis is present. Pulmonic Valve: The pulmonic valve was normal in structure. Pulmonic valve regurgitation is not visualized. No evidence of pulmonic stenosis. Aorta: The aortic root is normal in size and structure. Venous: The inferior vena cava is normal in size with greater than 50% respiratory variability, suggesting right atrial pressure of 3 mmHg. IAS/Shunts: No atrial level shunt detected by color flow Doppler.  LEFT VENTRICLE PLAX 2D LVIDd:         4.10 cm  Diastology LVIDs:         2.80 cm  LV e' medial:    6.09 cm/s LV PW:         0.80 cm  LV E/e' medial:  11.8 LV IVS:        0.70 cm  LV e' lateral:   7.09 cm/s LVOT diam:     1.90 cm  LV E/e' lateral: 10.1 LV SV:         60 LV SV Index:   46 LVOT Area:     2.84 cm  RIGHT VENTRICLE RV S prime:     16.20 cm/s TAPSE (M-mode): 1.8 cm LEFT ATRIUM             Index       RIGHT ATRIUM          Index LA diam:        3.20 cm 2.46 cm/m  RA Area:     8.44 cm LA Vol (A2C):   21.8 ml 16.75 ml/m RA Volume:   15.60 ml  11.99 ml/m LA Vol (A4C):   25.8 ml 19.83 ml/m LA Biplane Vol: 24.2 ml 18.60 ml/m  AORTIC VALVE LVOT Vmax:   122.00 cm/s LVOT Vmean:  86.000 cm/s LVOT VTI:    0.211 m  AORTA Ao Root diam: 2.80 cm Ao Asc diam:  3.10 cm MITRAL VALVE MV Area (PHT): 2.20 cm     SHUNTS MV Decel Time: 345 msec     Systemic VTI:  0.21 m MV E velocity: 71.70  cm/s   Systemic Diam: 1.90 cm MV A velocity: 101.00 cm/s MV E/A ratio:  0.71 Jenkins Rouge MD Electronically signed by Jenkins Rouge MD Signature Date/Time: 11/30/2020/11:56:03 AM    Final     Review of Systems  Constitutional: Negative for activity change, appetite change and unexpected weight change.  HENT: Negative for trouble swallowing and voice change.   Respiratory: Positive for shortness of breath. Negative for cough and wheezing.   Cardiovascular: Positive for chest pain. Negative for palpitations and leg swelling.  Gastrointestinal: Negative for abdominal pain and blood in stool.  Genitourinary: Negative for difficulty urinating and dysuria.  Musculoskeletal: Negative for arthralgias and myalgias.  Neurological: Negative for seizures, syncope and weakness.  All other systems reviewed and are negative.  Blood pressure (!) 127/57, pulse 69, temperature 98.8 F (37.1 C), temperature source Oral, resp. rate 16, height 4\' 11"  (1.499 m), weight 40.3 kg, SpO2 100 %. Physical Exam Vitals reviewed.  Constitutional:      General: She is not in acute distress. HENT:     Head: Normocephalic and atraumatic.  Eyes:     General: No scleral icterus.    Extraocular Movements: Extraocular movements intact.  Neck:     Vascular: No carotid bruit.  Cardiovascular:     Rate and Rhythm: Normal rate and regular rhythm.     Heart sounds: Normal heart sounds. No murmur heard. No friction rub. No gallop.   Pulmonary:     Effort: No respiratory distress.     Breath sounds: Normal breath sounds. No wheezing or rales.  Abdominal:     General: There is no distension.      Palpations: Abdomen is soft.  Musculoskeletal:        General: No swelling.     Cervical back: Neck supple.  Lymphadenopathy:     Cervical: No cervical adenopathy.  Skin:    General: Skin is warm and dry.  Neurological:     General: No focal deficit present.     Mental Status: She is alert and oriented to person, place, and time.     Cranial Nerves: No cranial nerve deficit.     Motor: No weakness.     Gait: Gait normal.   CARDIAC CATH  Widely patent left main  Heavily calcified left main, and LAD.  LAD contains significant tortuosity.  In the distal third there is 50% narrowing followed by an area of ectasia and then 70% narrowing near the apex.  Beyond this 3 moderate-sized diagonals are arise.  Circumflex gives origin to 3 obtuse marginals.  The first is moderate in size and contains a region of angulation in which there is a 90% stenosis.  A large ramus intermedius is tortuous and contains proximal 95% stenosis.  The stenosis is within an acute bend.  Right coronary is diffusely diseased proximally with 90% stenosis.  The vessel is small in diameter.  PDA is totally occluded and fills by right to right and left-to-right collaterals.  Normal LV function.  Numerous left ventricular AV fistula a cause left ventricular filling during left coronary injection.  RECOMMENDATIONS:   Calcific and ectatic coronary disease with marked tortuosity making any PCI procedure very difficult.  The LAD does not contain significant proximal disease.  There is moderate distal disease.  Would recommend consideration of coronary bypass grafting for ramus, circumflex, PDA, and distal LAD.  If this is not possible, perhaps he ramus intermedius could be stented although will be difficult due to tortuosity and calcification.  I reviewed the cath images with Dr. Marlou Porch. 3 vessel CAD, preserved LV function  Assessment/Plan: Mariah Peterson is a 75 yo woman with multiple CRF. No prior history of CAD.  Presented with new onset unstable CP. R/i for non-STEMI. Cath revealed 3 vessel CAD with preserved LV function. CABG indicated for survival benefit and relief of symptoms.  I discussed the general nature of the procedure, the need for general anesthesia, the incisions to be used, and the possible use of cardiopulmonary bypass to Mariah Peterson and her daughter(by phone). We discussed the expected hospital stay, overall recovery and short and long term outcomes. I informed them of the indications, risks, benefits and alternatives. She understands the risks include, but are not limited to death, stroke, MI, DVT/PE, bleeding, need for transfusion, infections, cardiac arrhythmias, as well other organ system dysfunction including respiratory, renal, or GI complications.   She is anemic and very small, almost certainly will require transfusions. Her creatinine is elevated at 1.59, higher risk of perioperative renal issues.  She accepts the risks and agree to proceed.  Mariah Peterson 12/01/2020, 4:46 PM

## 2020-12-01 NOTE — Plan of Care (Signed)
  Problem: Cardiovascular: Goal: Ability to achieve and maintain adequate cardiovascular perfusion will improve Outcome: Progressing Goal: Vascular access site(s) Level 0-1 will be maintained Outcome: Progressing   Problem: Education: Goal: Understanding of CV disease, CV risk reduction, and recovery process will improve Outcome: Progressing   Problem: Education: Goal: Knowledge of General Education information will improve Description: Including pain rating scale, medication(s)/side effects and non-pharmacologic comfort measures Outcome: Progressing

## 2020-12-01 NOTE — Progress Notes (Addendum)
Progress Note  Patient Name: Mariah Peterson Date of Encounter: 12/01/2020  [facility] Cardiologist: No primary care provider on file.   Subjective   Feeling better, no active chest pain, no significant shortness of breath  Inpatient Medications    Scheduled Meds: . amLODipine  10 mg Oral Daily  . aspirin EC  81 mg Oral Daily  . calcium carbonate  1 tablet Oral BID WC  . insulin aspart  0-15 Units Subcutaneous TID WC  . insulin aspart  0-5 Units Subcutaneous QHS  . insulin glargine  16 Units Subcutaneous Daily  . metoprolol tartrate  12.5 mg Oral BID  . nitroGLYCERIN  1 inch Topical Q6H  . rosuvastatin  40 mg Oral Daily  . sodium chloride flush  3 mL Intravenous Q12H  . sodium chloride flush  3 mL Intravenous Q12H   Continuous Infusions: . sodium chloride    . sodium chloride 1 mL/kg/hr (11/30/20 2045)  . heparin 500 Units/hr (12/01/20 0100)   PRN Meds: sodium chloride, acetaminophen, benzonatate, morphine injection, ondansetron (ZOFRAN) IV, oxyCODONE, sodium chloride flush   Vital Signs    Vitals:   11/30/20 2147 11/30/20 2324 12/01/20 0338 12/01/20 0930  BP: (!) 120/99 (!) 97/51 (!) 96/52 (!) 127/57  Pulse: 77 65 65 69  Resp: 13 14  16   Temp: 98 F (36.7 C) 98.7 F (37.1 C) 98.1 F (36.7 C) 98.8 F (37.1 C)  TempSrc: Oral Oral Oral Oral  SpO2: 99% 99% 100% 100%  Weight:   40.3 kg   Height:        Intake/Output Summary (Last 24 hours) at 12/01/2020 1009 Last data filed at 11/30/2020 2359 Gross per 24 hour  Intake 1008.35 ml  Output --  Net 1008.35 ml   Last 3 Weights 12/01/2020 11/30/2020 11/29/2020  Weight (lbs) 88 lb 12.8 oz 87 lb 11.2 oz 88 lb  Weight (kg) 40.279 kg 39.78 kg 39.917 kg      Telemetry    No adverse arrhythmias- Personally Reviewed  ECG    No new- Personally Reviewed  Physical Exam   GEN: No acute distress.   Neck: No JVD Cardiac: RRR, no murmurs, rubs, or gallops.  Respiratory: Clear to auscultation bilaterally. GI: Soft,  nontender, non-distended  MS: No edema; No deformity. Neuro:  Nonfocal  Psych: Normal affect   Cath site clean dry and intact  Labs    High Sensitivity Troponin:   Recent Labs  Lab 11/29/20 1638 11/29/20 1923 11/30/20 0514 11/30/20 0910  TROPONINIHS 23* 188* 3,858* 5,267*      Chemistry Recent Labs  Lab 11/29/20 1638 11/29/20 1711 11/30/20 0910 12/01/20 0124  NA 133*  --  137 137  K 4.2  --  3.6 3.9  CL 100  --  104 110  CO2 20*  --  22 21*  GLUCOSE 377*  --  124* 98  BUN 39*  --  35* 30*  CREATININE 1.85*  --  1.58* 1.44*  CALCIUM 9.6  --  9.6 9.5  PROT  --  6.6  --   --   ALBUMIN  --  3.5  --  2.6*  AST  --  21  --   --   ALT  --  18  --   --   ALKPHOS  --  44  --   --   BILITOT  --  0.6  --   --   GFRNONAA 28*  --  34* 38*  ANIONGAP 13  --  11 6     Hematology Recent Labs  Lab 11/29/20 1638 11/30/20 0514 12/01/20 0124  WBC 6.0 4.8 5.1  RBC 3.55* 3.34* 3.17*  HGB 11.2* 9.9* 9.4*  HCT 32.4* 31.8* 29.3*  MCV 91.3 95.2 92.4  MCH 31.5 29.6 29.7  MCHC 34.6 31.1 32.1  RDW 13.2 13.2 13.4  PLT 237 214 201    BNPNo results for input(s): BNP, PROBNP in the last 168 hours.   DDimer  Recent Labs  Lab 11/29/20 1923  DDIMER 1.48*     Radiology    CT Chest Wo Contrast  Result Date: 11/29/2020 CLINICAL DATA:  Centralized chest pain and cough which began this morning EXAM: CT CHEST WITHOUT CONTRAST TECHNIQUE: Multidetector CT imaging of the chest was performed following the standard protocol without IV contrast. COMPARISON:  Radiograph 11/29/2020 FINDINGS: Cardiovascular: Normal cardiac size. Trace pericardial effusion. Three-vessel coronary artery atherosclerosis. Atherosclerotic plaque within the normal caliber aorta. No hyperdense mural thickening or plaque displacement to suggest intramural hematoma. No periaortic stranding or hemorrhage. Central pulmonary arteries are normal caliber. Luminal evaluation precluded in the absence of contrast media. No major  venous abnormalities are evident. Mediastinum/Nodes: No mediastinal fluid or gas. Normal thyroid gland and thoracic inlet. No acute abnormality of the trachea or esophagus. No worrisome mediastinal or axillary adenopathy. Hilar nodal evaluation is limited in the absence of intravenous contrast media. Lungs/Pleura: No consolidation, features of edema, pneumothorax, or effusion. No suspicious pulmonary nodules or masses. Few scattered tiny calcified granulomata. Upper Abdomen: No acute abnormalities present in the visualized portions of the upper abdomen. Musculoskeletal: Multilevel degenerative changes are present in the imaged portions of the spine. No acute osseous abnormality or suspicious osseous lesion. No worrisome chest wall masses or lesions. IMPRESSION: 1. No acute intrathoracic process to provide a cause for patient's symptoms. 2. Sequela of prior granulomatous disease with few scattered calcified granulomata in the lungs. 3. Three-vessel coronary artery atherosclerosis. 4. Aortic Atherosclerosis (ICD10-I70.0). Electronically Signed   By: Lovena Le M.D.   On: 11/29/2020 19:22   CARDIAC CATHETERIZATION  Result Date: 11/30/2020  Widely patent left main  Heavily calcified left main, and LAD.  LAD contains significant tortuosity.  In the distal third there is 50% narrowing followed by an area of ectasia and then 70% narrowing near the apex.  Beyond this 3 moderate-sized diagonals are arise.  Circumflex gives origin to 3 obtuse marginals.  The first is moderate in size and contains a region of angulation in which there is a 90% stenosis.  A large ramus intermedius is tortuous and contains proximal 95% stenosis.  The stenosis is within an acute bend.  Right coronary is diffusely diseased proximally with 90% stenosis.  The vessel is small in diameter.  PDA is totally occluded and fills by right to right and left-to-right collaterals.  Normal LV function.  Numerous left ventricular AV fistula a cause  left ventricular filling during left coronary injection. RECOMMENDATIONS:  Calcific and ectatic coronary disease with marked tortuosity making any PCI procedure very difficult.  The LAD does not contain significant proximal disease.  There is moderate distal disease.  Would recommend consideration of coronary bypass grafting for ramus, circumflex, PDA, and distal LAD.  If this is not possible, perhaps he ramus intermedius could be stented although will be difficult due to tortuosity and calcification.  DG Chest Port 1 View  Result Date: 11/29/2020 CLINICAL DATA:  Chest pain and shortness of breath EXAM: PORTABLE CHEST 1 VIEW COMPARISON:  None. FINDINGS: The  heart size and mediastinal contours are within normal limits. Both lungs are clear. The visualized skeletal structures are unremarkable. IMPRESSION: No active disease. Electronically Signed   By: Prudencio Pair M.D.   On: 11/29/2020 18:59   ECHOCARDIOGRAM COMPLETE  Result Date: 11/30/2020    ECHOCARDIOGRAM REPORT   Patient Name:   MAHEEN CWIKLA Date of Exam: 11/30/2020 Medical Rec #:  [id number]     Height:       59.0 in Accession #:    [id number]    Weight:       88.0 lb Date of Birth:  11/08/1945     BSA:          1.301 m Patient Age:    75 years      BP:           133/63 mmHg Patient Gender: F             HR:           72 bpm. Exam Location:  Inpatient Procedure: 2D Echo, Color Doppler and Cardiac Doppler Indications:    NSTEMI I21.4  History:        Patient has no prior history of Echocardiogram examinations.                 Risk Factors:Hypertension and Diabetes.  Sonographer:    Bernadene Person RDCS Referring Phys: [id number] [place]  1. Left ventricular ejection fraction, by estimation, is 55 to 60%. The left ventricle has normal function. The left ventricle has no regional wall motion abnormalities. Left ventricular diastolic parameters were normal.  2. Right ventricular systolic function is normal. The right ventricular size is  normal.  3. The mitral valve is normal in structure. Trivial mitral valve regurgitation. No evidence of mitral stenosis.  4. The aortic valve is normal in structure. Aortic valve regurgitation is not visualized. No aortic stenosis is present.  5. The inferior vena cava is normal in size with greater than 50% respiratory variability, suggesting right atrial pressure of 3 mmHg. FINDINGS  Left Ventricle: Left ventricular ejection fraction, by estimation, is 55 to 60%. The left ventricle has normal function. The left ventricle has no regional wall motion abnormalities. The left ventricular internal cavity size was normal in size. There is  no left ventricular hypertrophy. Left ventricular diastolic parameters were normal. Right Ventricle: The right ventricular size is normal. No increase in right ventricular wall thickness. Right ventricular systolic function is normal. Left Atrium: Left atrial size was normal in size. Right Atrium: Right atrial size was normal in size. Pericardium: There is no evidence of pericardial effusion. Mitral Valve: The mitral valve is normal in structure. There is mild thickening of the mitral valve leaflet(s). Mild mitral annular calcification. Trivial mitral valve regurgitation. No evidence of mitral valve stenosis. Tricuspid Valve: The tricuspid valve is normal in structure. Tricuspid valve regurgitation is trivial. No evidence of tricuspid stenosis. Aortic Valve: The aortic valve is normal in structure. Aortic valve regurgitation is not visualized. No aortic stenosis is present. Pulmonic Valve: The pulmonic valve was normal in structure. Pulmonic valve regurgitation is not visualized. No evidence of pulmonic stenosis. Aorta: The aortic root is normal in size and structure. Venous: The inferior vena cava is normal in size with greater than 50% respiratory variability, suggesting right atrial pressure of 3 mmHg. IAS/Shunts: No atrial level shunt detected by color flow Doppler.  LEFT  VENTRICLE PLAX 2D LVIDd:         4.10 cm  Diastology LVIDs:         2.80 cm  LV e' medial:    6.09 cm/s LV PW:         0.80 cm  LV E/e' medial:  11.8 LV IVS:        0.70 cm  LV e' lateral:   7.09 cm/s LVOT diam:     1.90 cm  LV E/e' lateral: 10.1 LV SV:         60 LV SV Index:   46 LVOT Area:     2.84 cm  RIGHT VENTRICLE RV S prime:     16.20 cm/s TAPSE (M-mode): 1.8 cm LEFT ATRIUM             Index       RIGHT ATRIUM          Index LA diam:        3.20 cm 2.46 cm/m  RA Area:     8.44 cm LA Vol (A2C):   21.8 ml 16.75 ml/m RA Volume:   15.60 ml 11.99 ml/m LA Vol (A4C):   25.8 ml 19.83 ml/m LA Biplane Vol: 24.2 ml 18.60 ml/m  AORTIC VALVE LVOT Vmax:   122.00 cm/s LVOT Vmean:  86.000 cm/s LVOT VTI:    0.211 m  AORTA Ao Root diam: 2.80 cm Ao Asc diam:  3.10 cm MITRAL VALVE MV Area (PHT): 2.20 cm     SHUNTS MV Decel Time: 345 msec     Systemic VTI:  0.21 m MV E velocity: 71.70 cm/s   Systemic Diam: 1.90 cm MV A velocity: 101.00 cm/s MV E/A ratio:  0.71 Jenkins Rouge MD Electronically signed by Jenkins Rouge MD Signature Date/Time: 11/30/2020/11:56:03 AM    Final     Cardiac Studies   Cath 11/30/20:  Widely patent left main  Heavily calcified left main, and LAD.  LAD contains significant tortuosity.  In the distal third there is 50% narrowing followed by an area of ectasia and then 70% narrowing near the apex.  Beyond this 3 moderate-sized diagonals are arise.  Circumflex gives origin to 3 obtuse marginals.  The first is moderate in size and contains a region of angulation in which there is a 90% stenosis.  A large ramus intermedius is tortuous and contains proximal 95% stenosis.  The stenosis is within an acute bend.  Right coronary is diffusely diseased proximally with 90% stenosis.  The vessel is small in diameter.  PDA is totally occluded and fills by right to right and left-to-right collaterals.  Normal LV function.  Numerous left ventricular AV fistula a cause left ventricular filling during  left coronary injection.  RECOMMENDATIONS:   Calcific and ectatic coronary disease with marked tortuosity making any PCI procedure very difficult.  The LAD does not contain significant proximal disease.  There is moderate distal disease.  Would recommend consideration of coronary bypass grafting for ramus, circumflex, PDA, and distal LAD.  If this is not possible, perhaps he ramus intermedius could be stented although will be difficult due to tortuosity and calcification.  Recommendation: Anatomy is not ideal for PCI.  Recommend surgical evaluation to treat the PDA, obtuse marginal, ramus intermedius, and possibly the distal LAD and or diagonals with grafting.   Diagnostic Dominance: Right     ECHO 11/30/20:   1. Left ventricular ejection fraction, by estimation, is 55 to 60%. The  left ventricle has normal function. The left ventricle has no regional  wall motion abnormalities. Left ventricular diastolic parameters were  normal.  2. Right ventricular systolic function is normal. The right ventricular  size is normal.  3. The mitral valve is normal in structure. Trivial mitral valve  regurgitation. No evidence of mitral stenosis.  4. The aortic valve is normal in structure. Aortic valve regurgitation is  not visualized. No aortic stenosis is present.  5. The inferior vena cava is normal in size with greater than 50%  respiratory variability, suggesting right atrial pressure of 3 mmHg.   Patient Profile     75 y.o. female with severe multivessel coronary artery disease discovered on catheterization in the setting of non-STEMI with uncontrolled diabetes hemoglobin A1c 11.8, hypertension, hyperlipidemia  Assessment & Plan    Severe coronary artery disease in the setting of non-ST elevation myocardial infarction -Cardiac catheterization reviewed with Dr. Tamala Julian -We will consult cardiothoracic surgery for CABG evaluation -Continue with goal-directed medical therapy -Aspirin,  high intensity statin, low-dose metoprolol, IV heparin  Uncontrolled diabetes with hypertension and hyperlipidemia -High intensity Crestor 40 mg goal LDL less than 70.  Per primary team. -Consider SGLT2 inhibitor in the setting of underlying CAD  CKD stage IIIb -Baseline creatinine between 1.6 and 1.8.  Try to avoid NSAIDs.    Talked to [name] [phone], only daughter, Therapist, sports.   For questions or updates, please contact [place] Please consult [email or web address] for contact info under        Signed, Candee Furbish, MD  12/01/2020, 10:09 AM

## 2020-12-01 NOTE — Progress Notes (Signed)
[place] for Lovenox >> heparin Indication: chest pain/ACS  No Known Allergies  Patient Measurements: Height: 4\' 11"  (149.9 cm) Weight: 40.3 kg (88 lb 12.8 oz) IBW/kg (Calculated) : 43.2 Heparin Dosing Weight: 39.9 kg  Vital Signs: Temp: 98.8 F (37.1 C) (02/10 0930) Temp Source: Oral (02/10 0930) BP: 127/57 (02/10 0930) Pulse Rate: 69 (02/10 0930)  Labs: Recent Labs    11/29/20 1638 11/29/20 1923 11/30/20 0514 11/30/20 0910 12/01/20 0124 12/01/20 0928  HGB 11.2*  --  9.9*  --  9.4*  --   HCT 32.4*  --  31.8*  --  29.3*  --   PLT 237  --  214  --  201  --   HEPARINUNFRC  --   --   --   --   --  0.35  CREATININE 1.85*  --   --  1.58* 1.44*  --   TROPONINIHS 23* 188* 3,858* 5,267*  --   --     Estimated Creatinine Clearance: 22.1 mL/min (A) (by C-G formula based on SCr of 1.44 mg/dL (H)).   Medical History: Past Medical History:  Diagnosis Date  . Chest pain 11/30/2020  . Diabetes mellitus without complication ([employer])   . Glaucoma   . History of chicken pox   . Hypertension      Assessment: 75 yo F presents with chest pain, elevated troponin and transitioned from lovenox to heparin post cath. Plans for CABG evaluation -heparin level at goal  -hg= 9.4  Goal of Therapy:  Heparin level 0.3-0.7 units/ml Monitor platelets by anticoagulation protocol: Yes   Plan:  -Continue heparin infusion at 500 units/hr  -recheck heparin level in 8 hrs -Daily heparin level and CBC  Hildred Laser, PharmD Clinical Pharmacist **Pharmacist phone directory can now be found on [email or web address] ([email or web address]).  Listed under [place].

## 2020-12-01 NOTE — Progress Notes (Signed)
PROGRESS NOTE  CECILIE HEIDEL PPJ:[id number] DOB: 1946-05-15   PCP: Marrian Salvage, FNP  Patient is from: Home  DOA: 11/29/2020 LOS: 1  Chief complaints: Chest pain  Brief Narrative / Interim history: 75 year old female with history of uncontrolled DM-2, HTN and HLD presenting with substernal chest pain and admitted for non-STEMI. Troponin 23>> 188> 3900. Cardiology consulted.  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  CTS consulted for CABG evaluation.  TTE reassuring.  Subjective: Seen and examined earlier this morning.  No major events overnight or this morning.  No complaints.  She denies chest pain, dyspnea, GI or UTI symptoms.  Eager and anxious about the next step.  Objective: Vitals:   11/30/20 2147 11/30/20 2324 12/01/20 0338 12/01/20 0930  BP: (!) 120/99 (!) 97/51 (!) 96/52 (!) 127/57  Pulse: 77 65 65 69  Resp: 13 14  16   Temp: 98 F (36.7 C) 98.7 F (37.1 C) 98.1 F (36.7 C) 98.8 F (37.1 C)  TempSrc: Oral Oral Oral Oral  SpO2: 99% 99% 100% 100%  Weight:   40.3 kg   Height:        Intake/Output Summary (Last 24 hours) at 12/01/2020 1456 Last data filed at 11/30/2020 2359 Gross per 24 hour  Intake 1008.35 ml  Output --  Net 1008.35 ml   Filed Weights   11/29/20 1646 11/30/20 1340 12/01/20 0338  Weight: 39.9 kg 39.8 kg 40.3 kg    Examination:  GENERAL: No apparent distress.  Nontoxic. HEENT: MMM.  Vision and hearing grossly intact.  NECK: Supple.  No apparent JVD.  RESP: On RA.  No IWOB.  Fair aeration bilaterally. CVS:  RRR. Heart sounds normal.  ABD/GI/GU: BS+. Abd soft, NTND.  MSK/EXT:  Moves extremities.  Significant muscle mass and subcu fat loss. SKIN: no apparent skin lesion or wound NEURO: Awake, alert and oriented appropriately.  No apparent focal neuro deficit. PSYCH: Calm. Normal affect.   Procedures:  11/30/2020-LHC with calcific and ectatic coronary disease  with marked tortuosity making any PCI procedure very difficult.  Microbiology summarized: COVID-19 PCR nonreactive.  Assessment & Plan: Non-STEMI: She has typical chest pain with dynamic EKG changes and rising troponin.  Has been chest pain-free over 24 hours.  TTE reassuring.  LHC as above. -Cardiology consulting CTS for CABG evaluation. -Continue heparin drip, aspirin, metoprolol and Crestor -Continue nitro patch as needed for chest pain  Uncontrolled IDDM-2 with hyperglycemia: On Lantus, Ozempic and Actos at home. May be noncompliance. A1c 11.8 from 11.0 about three months prior. Recent Labs  Lab 11/30/20 1224 11/30/20 1635 11/30/20 2059 12/01/20 0744 12/01/20 1209  GLUCAP 70 100* 102* 92 167*  -Continue SSI  -Could benefit from SGLT2 inhibitors -Hold home Crestor and Ozempic. -Continue statin as above.  Essential hypertension: Normotensive. -Continue home amlodipine -Continue metoprolol-started here.  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 Recent Labs    12/28/19 1516 03/29/20 1458 08/15/20 1034 09/23/20 0947 10/10/20 1144 11/10/20 0955 11/29/20 1638 11/30/20 0910 12/01/20 0124  BUN 31* 26* 25* 26* 38* 30* 39* 35* 30*  CREATININE 1.48* 1.28* 1.51* 1.62* 1.88* 1.55* 1.85* 1.58* 1.44*  -Continue holding HCTZ -Avoid nephrotoxic meds -Monitor.  Hyperlipidemia: TC 302. HDL 54. LDL 226. -Continue high intensity statin  Elevated D-dimer: Nonspecific but low clinical suspicion for PE or DVT  Cough and congestion: Started on Z-Pak by PCP on the day of admission. She denies respiratory symptoms now. No respiratory distress. -Discontinue  Z-Pak  Underweight -Ozempic might contribute to weight loss. Body mass index is 17.94 kg/m.    Wt Readings from Last 10 Encounters:  12/01/20 40.3 kg  11/15/20 39.5 kg  10/10/20 39.9 kg  09/28/20 41.9 kg  08/18/20 43.7 kg  05/28/20 46.9 kg  05/18/20 46.3 kg  04/01/20 48.5 kg  12/31/19 46.2 kg  11/16/19 44.2 kg  -Consult  dietitian.       DVT prophylaxis:  SCD's Start: 11/30/20 1633On heparin drip.  Code Status: Full code Family Communication: Updated patient's husband at bedside. Level of care: Telemetry Cardiac Status is: Inpatient  Remains inpatient appropriate because:Ongoing diagnostic testing needed not appropriate for outpatient work up, IV treatments appropriate due to intensity of illness or inability to take PO and Inpatient level of care appropriate due to severity of illness   Dispo: The patient is from: Home              Anticipated d/c is to: Home              Anticipated d/c date is: 3 days              Patient currently is not medically stable to d/c.   Difficult to place patient No           Consultants:  Cardiology   Sch Meds:  Scheduled Meds: . amLODipine  10 mg Oral Daily  . aspirin EC  81 mg Oral Daily  . calcium carbonate  1 tablet Oral BID WC  . insulin aspart  0-15 Units Subcutaneous TID WC  . insulin aspart  0-5 Units Subcutaneous QHS  . insulin glargine  16 Units Subcutaneous Daily  . metoprolol tartrate  12.5 mg Oral BID  . nitroGLYCERIN  1 inch Topical Q6H  . rosuvastatin  40 mg Oral Daily  . sodium chloride flush  3 mL Intravenous Q12H  . sodium chloride flush  3 mL Intravenous Q12H   Continuous Infusions: . sodium chloride    . heparin 500 Units/hr (12/01/20 0100)   PRN Meds:.sodium chloride, acetaminophen, benzonatate, morphine injection, ondansetron (ZOFRAN) IV, oxyCODONE, sodium chloride flush  Antimicrobials: Anti-infectives (From admission, onward)   Start     Dose/Rate Route Frequency Ordered Stop   12/01/20 1000  azithromycin (ZITHROMAX) tablet 250 mg  Status:  Discontinued        250 mg Oral Daily 11/30/20 0819 11/30/20 1201   11/30/20 1000  azithromycin (ZITHROMAX) tablet 500 mg       Note to Pharmacy: 2 tabs po qd x 1 day; 1 tablet per day x 4 days;     500 mg Oral Daily 11/30/20 0327 12/01/20 0959       I have personally  reviewed the following labs and images: CBC: Recent Labs  Lab 11/29/20 1638 11/30/20 0514 12/01/20 0124  WBC 6.0 4.8 5.1  HGB 11.2* 9.9* 9.4*  HCT 32.4* 31.8* 29.3*  MCV 91.3 95.2 92.4  PLT 237 214 201   BMP &GFR Recent Labs  Lab 11/29/20 1638 11/30/20 0910 12/01/20 0124  NA 133* 137 137  K 4.2 3.6 3.9  CL 100 104 110  CO2 20* 22 21*  GLUCOSE 377* 124* 98  BUN 39* 35* 30*  CREATININE 1.85* 1.58* 1.44*  CALCIUM 9.6 9.6 9.5  MG  --   --  2.0  PHOS  --   --  4.5   Estimated Creatinine Clearance: 22.1 mL/min (A) (by C-G formula based on SCr of 1.44 mg/dL (H)). Liver &  Pancreas: Recent Labs  Lab 11/29/20 1711 12/01/20 0124  AST 21  --   ALT 18  --   ALKPHOS 44  --   BILITOT 0.6  --   PROT 6.6  --   ALBUMIN 3.5 2.6*   Recent Labs  Lab 11/29/20 1711  LIPASE 111*   No results for input(s): AMMONIA in the last 168 hours. Diabetic: No results for input(s): HGBA1C in the last 72 hours. Recent Labs  Lab 11/30/20 1224 11/30/20 1635 11/30/20 2059 12/01/20 0744 12/01/20 1209  GLUCAP 70 100* 102* 92 167*   Cardiac Enzymes: No results for input(s): CKTOTAL, CKMB, CKMBINDEX, TROPONINI in the last 168 hours. No results for input(s): PROBNP in the last 8760 hours. Coagulation Profile: No results for input(s): INR, PROTIME in the last 168 hours. Thyroid Function Tests: No results for input(s): TSH, T4TOTAL, FREET4, T3FREE, THYROIDAB in the last 72 hours. Lipid Profile: Recent Labs    11/30/20 0514  CHOL 302*  HDL 54  LDLCALC 226*  TRIG 108  CHOLHDL 5.6   Anemia Panel: No results for input(s): VITAMINB12, FOLATE, FERRITIN, TIBC, IRON, RETICCTPCT in the last 72 hours. Urine analysis:    Component Value Date/Time   COLORURINE YELLOW 06/26/2018 1942   APPEARANCEUR HAZY (A) 06/26/2018 1942   LABSPEC 1.011 06/26/2018 1942   PHURINE 5.0 06/26/2018 1942   GLUCOSEU >=500 (A) 06/26/2018 1942   HGBUR SMALL (A) 06/26/2018 1942   BILIRUBINUR NEGATIVE 06/26/2018  1942   BILIRUBINUR neg 03/22/2014 1025   [place] 06/26/2018 1942   PROTEINUR NEGATIVE 06/26/2018 1942   UROBILINOGEN 0.2 03/22/2014 1025   UROBILINOGEN 0.2 02/13/2014 0241   NITRITE NEGATIVE 06/26/2018 1942   LEUKOCYTESUR NEGATIVE 06/26/2018 1942   Sepsis Labs: Invalid input(s): PROCALCITONIN, [place]  Microbiology: Recent Results (from the past 240 hour(s))  Resp Panel by RT-PCR (Flu A&B, Covid) Nasopharyngeal Swab     Status: None   Collection Time: 11/29/20  5:13 PM   Specimen: Nasopharyngeal Swab; Nasopharyngeal(NP) swabs in vial transport medium  Result Value Ref Range Status   SARS Coronavirus 2 by RT PCR NEGATIVE NEGATIVE Final    Comment: (NOTE) SARS-CoV-2 target nucleic acids are NOT DETECTED.  The SARS-CoV-2 RNA is generally detectable in upper respiratory specimens during the acute phase of infection. The lowest concentration of SARS-CoV-2 viral copies this assay can detect is 138 copies/mL. A negative result does not preclude SARS-Cov-2 infection and should not be used as the sole basis for treatment or other patient management decisions. A negative result may occur with  improper specimen collection/handling, submission of specimen other than nasopharyngeal swab, presence of viral mutation(s) within the areas targeted by this assay, and inadequate number of viral copies(<138 copies/mL). A negative result must be combined with clinical observations, patient history, and epidemiological information. The expected result is Negative.  Fact Sheet for Patients:  EntrepreneurPulse.com.au  Fact Sheet for Healthcare Providers:  IncredibleEmployment.be  This test is no t yet approved or cleared by the [place] FDA and  has been authorized for detection and/or diagnosis of SARS-CoV-2 by FDA under an Emergency Use Authorization (EUA). This EUA will remain  in effect (meaning this test can be used) for the duration of  the COVID-19 declaration under Section 564(b)(1) of the Act, 21 U.S.C.section 360bbb-3(b)(1), unless the authorization is terminated  or revoked sooner.       Influenza A by PCR NEGATIVE NEGATIVE Final   Influenza B by PCR NEGATIVE NEGATIVE Final    Comment: (NOTE) The  Xpert Xpress SARS-CoV-2/FLU/RSV plus assay is intended as an aid in the diagnosis of influenza from Nasopharyngeal swab specimens and should not be used as a sole basis for treatment. Nasal washings and aspirates are unacceptable for Xpert Xpress SARS-CoV-2/FLU/RSV testing.  Fact Sheet for Patients: EntrepreneurPulse.com.au  Fact Sheet for Healthcare Providers: IncredibleEmployment.be  This test is not yet approved or cleared by the [place] FDA and has been authorized for detection and/or diagnosis of SARS-CoV-2 by FDA under an Emergency Use Authorization (EUA). This EUA will remain in effect (meaning this test can be used) for the duration of the COVID-19 declaration under Section 564(b)(1) of the Act, 21 U.S.C. section 360bbb-3(b)(1), unless the authorization is terminated or revoked.  Performed at [facility], [place] [address], [place], [place] [address]     Radiology Studies: CARDIAC CATHETERIZATION  Result Date: 11/30/2020  Widely patent left main  Heavily calcified left main, and LAD.  LAD contains significant tortuosity.  In the distal third there is 50% narrowing followed by an area of ectasia and then 70% narrowing near the apex.  Beyond this 3 moderate-sized diagonals are arise.  Circumflex gives origin to 3 obtuse marginals.  The first is moderate in size and contains a region of angulation in which there is a 90% stenosis.  A large ramus intermedius is tortuous and contains proximal 95% stenosis.  The stenosis is within an acute bend.  Right coronary is diffusely diseased proximally with 90% stenosis.  The vessel is small in diameter.  PDA is totally  occluded and fills by right to right and left-to-right collaterals.  Normal LV function.  Numerous left ventricular AV fistula a cause left ventricular filling during left coronary injection. RECOMMENDATIONS:  Calcific and ectatic coronary disease with marked tortuosity making any PCI procedure very difficult.  The LAD does not contain significant proximal disease.  There is moderate distal disease.  Would recommend consideration of coronary bypass grafting for ramus, circumflex, PDA, and distal LAD.  If this is not possible, perhaps he ramus intermedius could be stented although will be difficult due to tortuosity and calcification.     Braniyah Besse T. [place]  If 7PM-7AM, please contact night-coverage [email or web address] 12/01/2020, 2:56 PM

## 2020-12-01 NOTE — Progress Notes (Signed)
[place] for Lovenox >> IV Heparin Indication: chest pain/ACS  No Known Allergies  Patient Measurements: Height: 4\' 11"  (149.9 cm) Weight: 40.3 kg (88 lb 12.8 oz) IBW/kg (Calculated) : 43.2 Heparin Dosing Weight: 39.9 kg  Vital Signs: Temp: 98.8 F (37.1 C) (02/10 0930) Temp Source: Oral (02/10 0930) BP: 127/57 (02/10 0930) Pulse Rate: 69 (02/10 0930)  Labs: Recent Labs    11/29/20 1638 11/29/20 1923 11/30/20 0514 11/30/20 0910 12/01/20 0124 12/01/20 0928 12/01/20 1648  HGB 11.2*  --  9.9*  --  9.4*  --   --   HCT 32.4*  --  31.8*  --  29.3*  --   --   PLT 237  --  214  --  201  --   --   HEPARINUNFRC  --   --   --   --   --  0.35 0.40  CREATININE 1.85*  --   --  1.58* 1.44*  --   --   TROPONINIHS 23* 188* 3,858* 5,267*  --   --   --     Estimated Creatinine Clearance: 22.1 mL/min (A) (by C-G formula based on SCr of 1.44 mg/dL (H)).   Medical History: Past Medical History:  Diagnosis Date  . Chest pain 11/30/2020  . Diabetes mellitus without complication ([place])   . Glaucoma   . History of chicken pox   . Hypertension     Assessment: 75 yr old female presentws with chest pain, elevated troponins; was transitioned from Lovenox to heparin post cath. Plans for CABG evaluation.  Confirmatory heparin level ~7.5 hrs after initial therapeutic heparin level earlier today was 0.40 units/ml, and remains within the goal range for this pt. H/H 9.4/29.3, plt 201. Per RN, no issues with IV or bleeding observed.  Goal of Therapy:  Heparin level 0.3-0.7 units/ml Monitor platelets by anticoagulation protocol: Yes   Plan:  Continue heparin infusion at 500 units/hr  Monitor daily heparin level, CBC Monitor for bleeding  Gillermina Hu, PharmD, BCPS, [facility] Clinical Pharmacist

## 2020-12-02 ENCOUNTER — Inpatient Hospital Stay (HOSPITAL_COMMUNITY): Payer: Medicare Other

## 2020-12-02 DIAGNOSIS — E1169 Type 2 diabetes mellitus with other specified complication: Secondary | ICD-10-CM

## 2020-12-02 DIAGNOSIS — Z01810 Encounter for preprocedural cardiovascular examination: Secondary | ICD-10-CM | POA: Diagnosis not present

## 2020-12-02 DIAGNOSIS — R636 Underweight: Secondary | ICD-10-CM

## 2020-12-02 DIAGNOSIS — I214 Non-ST elevation (NSTEMI) myocardial infarction: Secondary | ICD-10-CM | POA: Diagnosis not present

## 2020-12-02 DIAGNOSIS — E1122 Type 2 diabetes mellitus with diabetic chronic kidney disease: Secondary | ICD-10-CM | POA: Diagnosis not present

## 2020-12-02 DIAGNOSIS — E1165 Type 2 diabetes mellitus with hyperglycemia: Secondary | ICD-10-CM

## 2020-12-02 DIAGNOSIS — I10 Essential (primary) hypertension: Secondary | ICD-10-CM | POA: Diagnosis not present

## 2020-12-02 LAB — CBC
HCT: 27.9 % — ABNORMAL LOW (ref 36.0–46.0)
Hemoglobin: 8.9 g/dL — ABNORMAL LOW (ref 12.0–15.0)
MCH: 29.6 pg (ref 26.0–34.0)
MCHC: 31.9 g/dL (ref 30.0–36.0)
MCV: 92.7 fL (ref 80.0–100.0)
Platelets: 197 10*3/uL (ref 150–400)
RBC: 3.01 MIL/uL — ABNORMAL LOW (ref 3.87–5.11)
RDW: 13.4 % (ref 11.5–15.5)
WBC: 4.8 10*3/uL (ref 4.0–10.5)
nRBC: 0 % (ref 0.0–0.2)

## 2020-12-02 LAB — RENAL FUNCTION PANEL
Albumin: 2.7 g/dL — ABNORMAL LOW (ref 3.5–5.0)
Anion gap: 6 (ref 5–15)
BUN: 36 mg/dL — ABNORMAL HIGH (ref 8–23)
CO2: 23 mmol/L (ref 22–32)
Calcium: 9.9 mg/dL (ref 8.9–10.3)
Chloride: 109 mmol/L (ref 98–111)
Creatinine, Ser: 1.59 mg/dL — ABNORMAL HIGH (ref 0.44–1.00)
GFR, Estimated: 34 mL/min — ABNORMAL LOW (ref >60)
Glucose, Bld: 102 mg/dL — ABNORMAL HIGH (ref 70–99)
Phosphorus: 4 mg/dL (ref 2.5–4.6)
Potassium: 4.3 mmol/L (ref 3.5–5.1)
Sodium: 138 mmol/L (ref 135–145)

## 2020-12-02 LAB — TYPE AND SCREEN
ABO/RH(D): B POS
Antibody Screen: NEGATIVE

## 2020-12-02 LAB — URINALYSIS, ROUTINE W REFLEX MICROSCOPIC
Bilirubin Urine: NEGATIVE
Glucose, UA: 50 mg/dL — AB
Hgb urine dipstick: NEGATIVE
Ketones, ur: NEGATIVE mg/dL
Leukocytes,Ua: NEGATIVE
Nitrite: NEGATIVE
Protein, ur: NEGATIVE mg/dL
Specific Gravity, Urine: 1.02 (ref 1.005–1.030)
pH: 5 (ref 5.0–8.0)

## 2020-12-02 LAB — ABO/RH: ABO/RH(D): B POS

## 2020-12-02 LAB — SURGICAL PCR SCREEN
MRSA, PCR: NEGATIVE
Staphylococcus aureus: NEGATIVE

## 2020-12-02 LAB — GLUCOSE, CAPILLARY
Glucose-Capillary: 99 mg/dL (ref 70–99)
Glucose-Capillary: 125 mg/dL — ABNORMAL HIGH (ref 70–99)
Glucose-Capillary: 197 mg/dL — ABNORMAL HIGH (ref 70–99)
Glucose-Capillary: 158 mg/dL — ABNORMAL HIGH (ref 70–99)
Glucose-Capillary: 209 mg/dL — ABNORMAL HIGH (ref 70–99)
Glucose-Capillary: 254 mg/dL — ABNORMAL HIGH (ref 70–99)

## 2020-12-02 LAB — MAGNESIUM: Magnesium: 1.9 mg/dL (ref 1.7–2.4)

## 2020-12-02 LAB — HEPARIN LEVEL (UNFRACTIONATED): Heparin Unfractionated: 0.3 IU/mL (ref 0.30–0.70)

## 2020-12-02 MED ORDER — TRANEXAMIC ACID (OHS) BOLUS VIA INFUSION
15.0000 mg/kg | INTRAVENOUS | Status: AC
  Administered 2020-12-05: 613.5 mg via INTRAVENOUS
  Filled 2020-12-02: qty 614

## 2020-12-02 MED ORDER — BISACODYL 5 MG PO TBEC
5.0000 mg | DELAYED_RELEASE_TABLET | Freq: Once | ORAL | Status: AC
  Administered 2020-12-04: 5 mg via ORAL
  Filled 2020-12-02: qty 1

## 2020-12-02 MED ORDER — INSULIN REGULAR(HUMAN) IN NACL 100-0.9 UT/100ML-% IV SOLN
INTRAVENOUS | Status: AC
  Administered 2020-12-05: .7 [IU]/h via INTRAVENOUS
  Filled 2020-12-02: qty 100

## 2020-12-02 MED ORDER — MILRINONE LACTATE IN DEXTROSE 20-5 MG/100ML-% IV SOLN
0.3000 ug/kg/min | INTRAVENOUS | Status: DC
  Filled 2020-12-02: qty 100

## 2020-12-02 MED ORDER — SODIUM CHLORIDE 0.9 % IV SOLN
INTRAVENOUS | Status: DC
  Filled 2020-12-02: qty 30

## 2020-12-02 MED ORDER — TEMAZEPAM 15 MG PO CAPS: 15.0000 mg | ORAL_CAPSULE | Freq: Once | ORAL | Status: DC | PRN

## 2020-12-02 MED ORDER — VANCOMYCIN HCL 1000 MG/200ML IV SOLN
1000.0000 mg | INTRAVENOUS | Status: AC
  Administered 2020-12-05: 1000 mg via INTRAVENOUS
  Filled 2020-12-02: qty 200

## 2020-12-02 MED ORDER — DEXMEDETOMIDINE HCL IN NACL 400 MCG/100ML IV SOLN
0.1000 ug/kg/h | INTRAVENOUS | Status: AC
  Administered 2020-12-05: .3 ug/kg/h via INTRAVENOUS
  Filled 2020-12-02: qty 100

## 2020-12-02 MED ORDER — ADULT MULTIVITAMIN W/MINERALS CH
1.0000 | ORAL_TABLET | Freq: Every day | ORAL | Status: DC
  Administered 2020-12-02 – 2020-12-04 (×3): 1 via ORAL
  Filled 2020-12-02 (×3): qty 1

## 2020-12-02 MED ORDER — CHLORHEXIDINE GLUCONATE 0.12 % MT SOLN: 15.0000 mL | Freq: Once | OROMUCOSAL | Status: DC

## 2020-12-02 MED ORDER — PLASMA-LYTE 148 IV SOLN
INTRAVENOUS | Status: DC
  Filled 2020-12-02: qty 2.5

## 2020-12-02 MED ORDER — NITROGLYCERIN IN D5W 200-5 MCG/ML-% IV SOLN
2.0000 ug/min | INTRAVENOUS | Status: DC
  Filled 2020-12-02: qty 250

## 2020-12-02 MED ORDER — MANNITOL 20 % IV SOLN
Freq: Once | INTRAVENOUS | Status: DC
  Filled 2020-12-02: qty 13

## 2020-12-02 MED ORDER — LORATADINE 10 MG PO TABS
10.0000 mg | ORAL_TABLET | Freq: Every day | ORAL | Status: DC
  Administered 2020-12-02 – 2020-12-04 (×3): 10 mg via ORAL
  Filled 2020-12-02 (×3): qty 1

## 2020-12-02 MED ORDER — EPINEPHRINE HCL 5 MG/250ML IV SOLN IN NS
0.0000 ug/min | INTRAVENOUS | Status: DC
  Filled 2020-12-02: qty 250

## 2020-12-02 MED ORDER — PHENYLEPHRINE HCL-NACL 20-0.9 MG/250ML-% IV SOLN
30.0000 ug/min | INTRAVENOUS | Status: AC
  Administered 2020-12-05: 30 ug/min via INTRAVENOUS
  Filled 2020-12-02: qty 250

## 2020-12-02 MED ORDER — TRANEXAMIC ACID (OHS) PUMP PRIME SOLUTION
2.0000 mg/kg | INTRAVENOUS | Status: DC
  Filled 2020-12-02: qty 0.82

## 2020-12-02 MED ORDER — SODIUM CHLORIDE 0.9 % IV SOLN
1.5000 g | INTRAVENOUS | Status: AC
  Administered 2020-12-05: 1.5 g via INTRAVENOUS
  Filled 2020-12-02: qty 1.5

## 2020-12-02 MED ORDER — DIAZEPAM 2 MG PO TABS
2.0000 mg | ORAL_TABLET | Freq: Once | ORAL | Status: AC
  Administered 2020-12-05: 2 mg via ORAL
  Filled 2020-12-02: qty 1

## 2020-12-02 MED ORDER — NOREPINEPHRINE 4 MG/250ML-% IV SOLN
0.0000 ug/min | INTRAVENOUS | Status: DC
  Filled 2020-12-02: qty 250

## 2020-12-02 MED ORDER — TRANEXAMIC ACID 1000 MG/10ML IV SOLN
1.5000 mg/kg/h | INTRAVENOUS | Status: AC
  Administered 2020-12-05: 1.5 mg/kg/h via INTRAVENOUS
  Filled 2020-12-02: qty 25

## 2020-12-02 MED ORDER — POTASSIUM CHLORIDE 2 MEQ/ML IV SOLN
80.0000 meq | INTRAVENOUS | Status: DC
  Filled 2020-12-02: qty 40

## 2020-12-02 MED ORDER — SODIUM CHLORIDE 0.9 % IV SOLN
750.0000 mg | INTRAVENOUS | Status: AC
  Administered 2020-12-05: 750 mg via INTRAVENOUS
  Filled 2020-12-02: qty 750

## 2020-12-02 MED ORDER — VANCOMYCIN HCL 1250 MG/250ML IV SOLN
1250.0000 mg | INTRAVENOUS | Status: DC
  Filled 2020-12-02: qty 250

## 2020-12-02 MED ORDER — GLUCERNA SHAKE PO LIQD
237.0000 mL | Freq: Three times a day (TID) | ORAL | Status: DC
  Administered 2020-12-02 – 2020-12-04 (×9): 237 mL via ORAL

## 2020-12-02 NOTE — Progress Notes (Signed)
[facility] for Lovenox >> IV Heparin Indication: chest pain/ACS  No Known Allergies  Patient Measurements: Height: 4\' 11"  (149.9 cm) Weight: 40.9 kg (90 lb 3.2 oz) IBW/kg (Calculated) : 43.2 Heparin Dosing Weight: 39.9 kg  Vital Signs: Temp: 99.1 F (37.3 C) (02/11 1130) Temp Source: Oral (02/11 1130) BP: 121/55 (02/11 1130) Pulse Rate: 76 (02/11 1130)  Labs: Recent Labs    11/29/20 1923 11/30/20 0514 11/30/20 0910 12/01/20 0124 12/01/20 0928 12/01/20 1648 12/02/20 0039  HGB  --  9.9*  --  9.4*  --   --  8.9*  HCT  --  31.8*  --  29.3*  --   --  27.9*  PLT  --  214  --  201  --   --  197  HEPARINUNFRC  --   --   --   --  0.35 0.40 0.30  CREATININE  --   --  1.58* 1.44*  --   --  1.59*  TROPONINIHS 188* 3,858* 5,267*  --   --   --   --     Estimated Creatinine Clearance: 20.3 mL/min (A) (by C-G formula based on SCr of 1.59 mg/dL (H)).   Medical History: Past Medical History:  Diagnosis Date  . Chest pain 11/30/2020  . Diabetes mellitus without complication ([place])   . Glaucoma   . History of chicken pox   . Hypertension     Assessment: 75 yr old female presentws with chest pain, elevated troponins; was transitioned from Lovenox to heparin post cath. Plans for CABG on 2/14. -heparin level= 0.3 -hg= 8.9   Goal of Therapy:  Heparin level 0.3-0.7 units/ml Monitor platelets by anticoagulation protocol: Yes   Plan:  -Increase heparin to 550 units/he to keep heparin level in range -Daily heparin level and CBC  Hildred Laser, PharmD Clinical Pharmacist **Pharmacist phone directory can now be found on [email or web address] ([email or web address]).  Listed under [place].

## 2020-12-02 NOTE — Progress Notes (Addendum)
Progress Note  Patient Name: Mariah Peterson Date of Encounter: 12/02/2020  [facility] Cardiologist: Candee Furbish, MD  Subjective   No acute overnight events. Patient doing well this morning. No chest pain or shortness of breath. Her only complaint this morning is that she continues to have a cough, mainly at night. She was recently prescribed Azithromycin by her PCP but this was stopped on admission. She is wondering if this should be restarted.  Inpatient Medications    Scheduled Meds: . amLODipine  10 mg Oral Daily  . aspirin EC  81 mg Oral Daily  . calcium carbonate  1 tablet Oral BID WC  . insulin aspart  0-15 Units Subcutaneous TID WC  . insulin aspart  0-5 Units Subcutaneous QHS  . insulin glargine  16 Units Subcutaneous Daily  . metoprolol tartrate  12.5 mg Oral BID  . nitroGLYCERIN  1 inch Topical Q6H  . rosuvastatin  40 mg Oral Daily  . sodium chloride flush  3 mL Intravenous Q12H  . sodium chloride flush  3 mL Intravenous Q12H   Continuous Infusions: . sodium chloride    . heparin 500 Units/hr (12/01/20 0100)   PRN Meds: sodium chloride, acetaminophen, benzonatate, morphine injection, ondansetron (ZOFRAN) IV, oxyCODONE, sodium chloride flush   Vital Signs    Vitals:   12/01/20 0930 12/01/20 2005 12/01/20 2300 12/02/20 0456  BP: (!) 127/57 (!) 146/76 92/63 (!) 105/59  Pulse: 69 91  74  Resp: 16   16  Temp: 98.8 F (37.1 C) 98.7 F (37.1 C) 98.9 F (37.2 C) 97.8 F (36.6 C)  TempSrc: Oral Oral Oral Oral  SpO2: 100%   100%  Weight:    40.9 kg  Height:        Intake/Output Summary (Last 24 hours) at 12/02/2020 0708 Last data filed at 12/01/2020 2130 Gross per 24 hour  Intake 240 ml  Output --  Net 240 ml   Last 3 Weights 12/02/2020 12/01/2020 11/30/2020  Weight (lbs) 90 lb 3.2 oz 88 lb 12.8 oz 87 lb 11.2 oz  Weight (kg) 40.914 kg 40.279 kg 39.78 kg      Telemetry    Normal sinus rhythm with rates in the 60's to 80's. - Personally Reviewed  ECG     No new ECG tracing today. - Personally Reviewed  Physical Exam   GEN: No acute distress.   Neck: No JVD. Cardiac: RRR. No murmurs, rubs, or gallops. Right radial cath site soft and stable. Respiratory: Clear to auscultation bilaterally. No wheezes, rhonchi, or rales. GI: Soft, non-distended, and non-tender.  MS: No lower extremity edema. No deformity. Skin: Warm and dry. Neuro:  No focal deficits. Psych: Normal affect. Responds appropriately.   Labs    High Sensitivity Troponin:   Recent Labs  Lab 11/29/20 1638 11/29/20 1923 11/30/20 0514 11/30/20 0910  TROPONINIHS 23* 188* 3,858* 5,267*      Chemistry Recent Labs  Lab 11/29/20 1711 11/30/20 0910 12/01/20 0124 12/02/20 0039  NA  --  137 137 138  K  --  3.6 3.9 4.3  CL  --  104 110 109  CO2  --  22 21* 23  GLUCOSE  --  124* 98 102*  BUN  --  35* 30* 36*  CREATININE  --  1.58* 1.44* 1.59*  CALCIUM  --  9.6 9.5 9.9  PROT 6.6  --   --   --   ALBUMIN 3.5  --  2.6* 2.7*  AST 21  --   --   --  ALT 18  --   --   --   ALKPHOS 44  --   --   --   BILITOT 0.6  --   --   --   GFRNONAA  --  34* 38* 34*  ANIONGAP  --  11 6 6      Hematology Recent Labs  Lab 11/30/20 0514 12/01/20 0124 12/02/20 0039  WBC 4.8 5.1 4.8  RBC 3.34* 3.17* 3.01*  HGB 9.9* 9.4* 8.9*  HCT 31.8* 29.3* 27.9*  MCV 95.2 92.4 92.7  MCH 29.6 29.7 29.6  MCHC 31.1 32.1 31.9  RDW 13.2 13.4 13.4  PLT 214 201 197    BNPNo results for input(s): BNP, PROBNP in the last 168 hours.   DDimer  Recent Labs  Lab 11/29/20 1923  DDIMER 1.48*     Radiology    CARDIAC CATHETERIZATION  Result Date: 11/30/2020  Widely patent left main  Heavily calcified left main, and LAD.  LAD contains significant tortuosity.  In the distal third there is 50% narrowing followed by an area of ectasia and then 70% narrowing near the apex.  Beyond this 3 moderate-sized diagonals are arise.  Circumflex gives origin to 3 obtuse marginals.  The first is moderate in  size and contains a region of angulation in which there is a 90% stenosis.  A large ramus intermedius is tortuous and contains proximal 95% stenosis.  The stenosis is within an acute bend.  Right coronary is diffusely diseased proximally with 90% stenosis.  The vessel is small in diameter.  PDA is totally occluded and fills by right to right and left-to-right collaterals.  Normal LV function.  Numerous left ventricular AV fistula a cause left ventricular filling during left coronary injection. RECOMMENDATIONS:  Calcific and ectatic coronary disease with marked tortuosity making any PCI procedure very difficult.  The LAD does not contain significant proximal disease.  There is moderate distal disease.  Would recommend consideration of coronary bypass grafting for ramus, circumflex, PDA, and distal LAD.  If this is not possible, perhaps he ramus intermedius could be stented although will be difficult due to tortuosity and calcification.  ECHOCARDIOGRAM COMPLETE  Result Date: 11/30/2020    ECHOCARDIOGRAM REPORT   Patient Name:   Mariah Peterson Date of Exam: 11/30/2020 Medical Rec #:  [id number]     Height:       59.0 in Accession #:    [id number]    Weight:       88.0 lb Date of Birth:  1946/08/27     BSA:          1.301 m Patient Age:    75 years      BP:           133/63 mmHg Patient Gender: F             HR:           72 bpm. Exam Location:  Inpatient Procedure: 2D Echo, Color Doppler and Cardiac Doppler Indications:    NSTEMI I21.4  History:        Patient has no prior history of Echocardiogram examinations.                 Risk Factors:Hypertension and Diabetes.  Sonographer:    Bernadene Person RDCS Referring Phys: [id number] [place]  1. Left ventricular ejection fraction, by estimation, is 55 to 60%. The left ventricle has normal function. The left ventricle has no regional wall motion abnormalities. Left ventricular diastolic  parameters were normal.  2. Right ventricular systolic function  is normal. The right ventricular size is normal.  3. The mitral valve is normal in structure. Trivial mitral valve regurgitation. No evidence of mitral stenosis.  4. The aortic valve is normal in structure. Aortic valve regurgitation is not visualized. No aortic stenosis is present.  5. The inferior vena cava is normal in size with greater than 50% respiratory variability, suggesting right atrial pressure of 3 mmHg. FINDINGS  Left Ventricle: Left ventricular ejection fraction, by estimation, is 55 to 60%. The left ventricle has normal function. The left ventricle has no regional wall motion abnormalities. The left ventricular internal cavity size was normal in size. There is  no left ventricular hypertrophy. Left ventricular diastolic parameters were normal. Right Ventricle: The right ventricular size is normal. No increase in right ventricular wall thickness. Right ventricular systolic function is normal. Left Atrium: Left atrial size was normal in size. Right Atrium: Right atrial size was normal in size. Pericardium: There is no evidence of pericardial effusion. Mitral Valve: The mitral valve is normal in structure. There is mild thickening of the mitral valve leaflet(s). Mild mitral annular calcification. Trivial mitral valve regurgitation. No evidence of mitral valve stenosis. Tricuspid Valve: The tricuspid valve is normal in structure. Tricuspid valve regurgitation is trivial. No evidence of tricuspid stenosis. Aortic Valve: The aortic valve is normal in structure. Aortic valve regurgitation is not visualized. No aortic stenosis is present. Pulmonic Valve: The pulmonic valve was normal in structure. Pulmonic valve regurgitation is not visualized. No evidence of pulmonic stenosis. Aorta: The aortic root is normal in size and structure. Venous: The inferior vena cava is normal in size with greater than 50% respiratory variability, suggesting right atrial pressure of 3 mmHg. IAS/Shunts: No atrial level shunt  detected by color flow Doppler.  LEFT VENTRICLE PLAX 2D LVIDd:         4.10 cm  Diastology LVIDs:         2.80 cm  LV e' medial:    6.09 cm/s LV PW:         0.80 cm  LV E/e' medial:  11.8 LV IVS:        0.70 cm  LV e' lateral:   7.09 cm/s LVOT diam:     1.90 cm  LV E/e' lateral: 10.1 LV SV:         60 LV SV Index:   46 LVOT Area:     2.84 cm  RIGHT VENTRICLE RV S prime:     16.20 cm/s TAPSE (M-mode): 1.8 cm LEFT ATRIUM             Index       RIGHT ATRIUM          Index LA diam:        3.20 cm 2.46 cm/m  RA Area:     8.44 cm LA Vol (A2C):   21.8 ml 16.75 ml/m RA Volume:   15.60 ml 11.99 ml/m LA Vol (A4C):   25.8 ml 19.83 ml/m LA Biplane Vol: 24.2 ml 18.60 ml/m  AORTIC VALVE LVOT Vmax:   122.00 cm/s LVOT Vmean:  86.000 cm/s LVOT VTI:    0.211 m  AORTA Ao Root diam: 2.80 cm Ao Asc diam:  3.10 cm MITRAL VALVE MV Area (PHT): 2.20 cm     SHUNTS MV Decel Time: 345 msec     Systemic VTI:  0.21 m MV E velocity: 71.70 cm/s   Systemic Diam: 1.90 cm  MV A velocity: 101.00 cm/s MV E/A ratio:  0.71 Jenkins Rouge MD Electronically signed by Jenkins Rouge MD Signature Date/Time: 11/30/2020/11:56:03 AM    Final     Cardiac Studies   Echocardiogram 11/30/2020: Impressions: 1. Left ventricular ejection fraction, by estimation, is 55 to 60%. The  left ventricle has normal function. The left ventricle has no regional  wall motion abnormalities. Left ventricular diastolic parameters were  normal.  2. Right ventricular systolic function is normal. The right ventricular  size is normal.  3. The mitral valve is normal in structure. Trivial mitral valve  regurgitation. No evidence of mitral stenosis.  4. The aortic valve is normal in structure. Aortic valve regurgitation is  not visualized. No aortic stenosis is present.  5. The inferior vena cava is normal in size with greater than 50%  respiratory variability, suggesting right atrial pressure of 3 mmHg. _______________  Cardiac Catheterization  11/30/2020:  Widely patent left main  Heavily calcified left main, and LAD.  LAD contains significant tortuosity.  In the distal third there is 50% narrowing followed by an area of ectasia and then 70% narrowing near the apex.  Beyond this 3 moderate-sized diagonals are arise.  Circumflex gives origin to 3 obtuse marginals.  The first is moderate in size and contains a region of angulation in which there is a 90% stenosis.  A large ramus intermedius is tortuous and contains proximal 95% stenosis.  The stenosis is within an acute bend.  Right coronary is diffusely diseased proximally with 90% stenosis.  The vessel is small in diameter.  PDA is totally occluded and fills by right to right and left-to-right collaterals.  Normal LV function.  Numerous left ventricular AV fistula a cause left ventricular filling during left coronary injection.  Recommendations:  Calcific and ectatic coronary disease with marked tortuosity making any PCI procedure very difficult.  The LAD does not contain significant proximal disease.  There is moderate distal disease.  Would recommend consideration of coronary bypass grafting for ramus, circumflex, PDA, and distal LAD.  If this is not possible, perhaps he ramus intermedius could be stented although will be difficult due to tortuosity and calcification.  Diagnostic Dominance: Right     Patient Profile     75 y.o. female with a history of hypertension, hyperlipidemia, and uncontrolled type 2 diabetes mellitus who presented with chest pain and was found to have a NSTEMI.  Assessment & Plan    NSTEMI - High-sensitivity troponin elevated at 23 >> 188 >> 3,858 >> 5,267.  - Echo showed LVEF of 55-60% with normal wall motion and diastolic function. No significant valvular disease.  - LHC showed severe multivessel CAD including heavily calcified left main and LAD. - CT surgery was consulted and plan is for CABG. Currently on the board for 12/05/2020. Continue IV  Heparin drip. Continue aspirin, beta-blocker, and high-intensity statin.  - Doing well today - no chest pain or shortness of breath.  Hypertension - BP mostly well controlled and even soft at times.  - Continue current medications: Amlodipine 10mg  daily and Lopressor 12.5mg  twice daily. - If BP gets too soft, would decrease/stop Amlodipine first.  Hyperlipidemia - Lipid panel this admission: Total Cholesterol 302, Triglycerides 108, HDL 54, LDL 226.  - LDL goal <70 given CAD. - Crestor 40mg  daily listed under PTA medications but patient reported she was not taking this. This has been restarted. - Will need repeat lipid panel/LFTs in 6-8 weeks.  Type 2 Diabetes Mellitus - Hemoglobin A1c  11.8 this admission. - Management per primary team.  CKD Stage III - Creatinine 1.85 on admission. Improved to 1.59 today. Baseline 1.2 to 1.6. - Continue to avoid nephrotoxic agents. - Continue to monitor.   Anemia - Hemoglobin 8.9 today. Down from 11.2 on admission. Baseline in the 10 range.  - Management per primary team.  Cough - Patient's main complaint this morning is persistent cough (mostly at night).he was recently prescribed Azithromycin by her PCP but this was stopped on admission. She is wondering if this should be restarted.  - Will add Claritin to see if that helps. - Will defer restarting antibiotics to primary team.   For questions or updates, please contact [place] Please consult [email or web address] for contact info under        Signed, Darreld Mclean, PA-C  12/02/2020, 7:08 AM    Personally seen and examined. Agree with above.  Non-ST elevation myocardial infarction troponin 5200 Severe coronary artery disease-normal EF Diabetes hypertension hyperlipidemia with chronic kidney disease stage IIIb-creatinine 1.59 Cough  -Appreciate cardiothoracic surgery consultation.  Cardiac catheterization personally reviewed with CT surgery team. -Had discussion yesterday with  daughter at length on phone.  Answered all questions.  She is an Therapist, sports. -Continue with goal-directed therapy. -Await notification on timing of potential CABG.  Obviously if it is later next week, I would not be opposed to discharge home prior to CABG.  If it is early next week, we will keep her here over the weekend.  Discussion with she and her husband.  Candee Furbish, MD

## 2020-12-02 NOTE — Progress Notes (Signed)
Progress Note  Patient Name: Mariah Peterson Date of Encounter: 12/03/2020  [facility] Cardiologist: Candee Furbish, MD   Subjective   Doing well. No chest pain. Asking to walk the hallways.  Inpatient Medications    Scheduled Meds: . amLODipine  10 mg Oral Daily  . aspirin EC  81 mg Oral Daily  . [START ON 12/04/2020] bisacodyl  5 mg Oral Once  . calcium carbonate  1 tablet Oral BID WC  . [START ON 12/05/2020] chlorhexidine  15 mL Mouth/Throat Once  . [START ON 12/05/2020] diazepam  2 mg Oral Once  . [START ON 12/05/2020] epinephrine  0-10 mcg/min Intravenous To OR  . feeding supplement (GLUCERNA SHAKE)  237 mL Oral TID BM  . [START ON 12/05/2020] heparin-papaverine-plasmalyte irrigation   Irrigation To OR  . insulin aspart  0-15 Units Subcutaneous TID WC  . insulin aspart  0-5 Units Subcutaneous QHS  . insulin glargine  16 Units Subcutaneous Daily  . [START ON 12/05/2020] insulin   Intravenous To OR  . [START ON 12/05/2020] [place] Blood Cardioplegia vial (lidocaine/magnesium/mannitol 0.26g-4g-6.4g)   Intracoronary Once  . loratadine  10 mg Oral Daily  . metoprolol tartrate  12.5 mg Oral BID  . multivitamin with minerals  1 tablet Oral Daily  . nitroGLYCERIN  1 inch Topical Q6H  . [START ON 12/05/2020] phenylephrine  30-200 mcg/min Intravenous To OR  . [START ON 12/05/2020] potassium chloride  80 mEq Other To OR  . rosuvastatin  40 mg Oral Daily  . sodium chloride flush  3 mL Intravenous Q12H  . sodium chloride flush  3 mL Intravenous Q12H  . [START ON 12/05/2020] tranexamic acid  15 mg/kg Intravenous To OR  . [START ON 12/05/2020] tranexamic acid  2 mg/kg Intracatheter To OR   Continuous Infusions: . sodium chloride    . [START ON 12/05/2020] cefUROXime (ZINACEF)  IV    . [START ON 12/05/2020] cefUROXime (ZINACEF)  IV    . [START ON 12/05/2020] dexmedetomidine    . [START ON 12/05/2020] heparin 30,000 units/NS 1000 mL solution for CELLSAVER    . heparin 550 Units/hr (12/03/20  0138)  . [START ON 12/05/2020] milrinone    . [START ON 12/05/2020] nitroGLYCERIN    . [START ON 12/05/2020] norepinephrine    . [START ON 12/05/2020] tranexamic acid (CYKLOKAPRON) infusion ([facility])    . [START ON 12/05/2020] vancomycin     PRN Meds: sodium chloride, acetaminophen, benzonatate, morphine injection, ondansetron (ZOFRAN) IV, oxyCODONE, sodium chloride flush, [START ON 12/04/2020] temazepam   Vital Signs    Vitals:   12/02/20 1130 12/02/20 1700 12/02/20 2046 12/03/20 0446  BP: (!) 121/55 (!) 136/59 (!) 122/58 (!) 107/53  Pulse: 76 86    Resp: 18 18    Temp: 99.1 F (37.3 C) 99.1 F (37.3 C) 98 F (36.7 C) 98.7 F (37.1 C)  TempSrc: Oral Oral Oral Oral  SpO2:  100%    Weight:    42.3 kg  Height:       No intake or output data in the 24 hours ending 12/03/20 0742 Last 3 Weights 12/03/2020 12/02/2020 12/01/2020  Weight (lbs) 93 lb 3.2 oz 90 lb 3.2 oz 88 lb 12.8 oz  Weight (kg) 42.275 kg 40.914 kg 40.279 kg      Telemetry    NSR - Personally Reviewed  ECG    No new tracing - Personally Reviewed  Physical Exam   GEN: No acute distress.   Neck: No JVD Cardiac: RRR,  no murmurs, rubs, or gallops.  Respiratory: Clear to auscultation bilaterally. GI: Soft, nontender, non-distended  MS: No edema; No deformity. Neuro:  Nonfocal  Psych: Normal affect   Labs    High Sensitivity Troponin:   Recent Labs  Lab 11/29/20 1638 11/29/20 1923 11/30/20 0514 11/30/20 0910  TROPONINIHS 23* 188* 3,858* 5,267*      Chemistry Recent Labs  Lab 11/29/20 1711 11/30/20 0910 12/01/20 0124 12/02/20 0039  NA  --  137 137 138  K  --  3.6 3.9 4.3  CL  --  104 110 109  CO2  --  22 21* 23  GLUCOSE  --  124* 98 102*  BUN  --  35* 30* 36*  CREATININE  --  1.58* 1.44* 1.59*  CALCIUM  --  9.6 9.5 9.9  PROT 6.6  --   --   --   ALBUMIN 3.5  --  2.6* 2.7*  AST 21  --   --   --   ALT 18  --   --   --   ALKPHOS 44  --   --   --   BILITOT 0.6  --   --   --   GFRNONAA  --  34* 38*  34*  ANIONGAP  --  11 6 6      Hematology Recent Labs  Lab 12/01/20 0124 12/02/20 0039 12/03/20 0338  WBC 5.1 4.8 4.1  RBC 3.17* 3.01* 2.82*  HGB 9.4* 8.9* 8.7*  HCT 29.3* 27.9* 25.6*  MCV 92.4 92.7 90.8  MCH 29.7 29.6 30.9  MCHC 32.1 31.9 34.0  RDW 13.4 13.4 13.4  PLT 201 197 180    BNPNo results for input(s): BNP, PROBNP in the last 168 hours.   DDimer  Recent Labs  Lab 11/29/20 1923  DDIMER 1.48*     Radiology    VAS US DOPPLER PRE CABG  Result Date: 12/02/2020 PREOPERATIVE VASCULAR EVALUATION  Indications:      Pre-CABG. Risk Factors:     Hypertension, hyperlipidemia, Diabetes, coronary artery                   disease. Comparison Study: No prior studies. Performing Technologist: Darlin Coco RDMS  Examination Guidelines: A complete evaluation includes B-mode imaging, spectral Doppler, color Doppler, and power Doppler as needed of all accessible portions of each vessel. Bilateral testing is considered an integral part of a complete examination. Limited examinations for reoccurring indications may be performed as noted.  Right Carotid Findings: +----------+--------+--------+--------+------------+------------------+           PSV cm/sEDV cm/sStenosisDescribe    Comments           +----------+--------+--------+--------+------------+------------------+ CCA Prox  107     16                          intimal thickening +----------+--------+--------+--------+------------+------------------+ CCA Distal93      19              heterogenous                   +----------+--------+--------+--------+------------+------------------+ ICA Prox  83      19      1-39%   heterogenous                   +----------+--------+--------+--------+------------+------------------+ ICA Distal70      18  tortuous           +----------+--------+--------+--------+------------+------------------+ ECA       102     12                                              +----------+--------+--------+--------+------------+------------------+ Portions of this table do not appear on this page. +----------+--------+-------+----------------+------------+           PSV cm/sEDV cmsDescribe        Arm Pressure +----------+--------+-------+----------------+------------+ Subclavian226            Multiphasic, WNL             +----------+--------+-------+----------------+------------+ +---------+--------+--+--------+--+---------+ VertebralPSV cm/s88EDV cm/s26Antegrade +---------+--------+--+--------+--+---------+ Left Carotid Findings: +----------+--------+--------+--------+------------+------------------+           PSV cm/sEDV cm/sStenosisDescribe    Comments           +----------+--------+--------+--------+------------+------------------+ CCA Prox  138     18                          intimal thickening +----------+--------+--------+--------+------------+------------------+ CCA Distal92      19              heterogenous                   +----------+--------+--------+--------+------------+------------------+ ICA Prox  86      18      1-39%   heterogenous                   +----------+--------+--------+--------+------------+------------------+ ICA Distal93      25                          tortuous           +----------+--------+--------+--------+------------+------------------+ ECA       168                                                    +----------+--------+--------+--------+------------+------------------+ +----------+--------+--------+----------------+------------+ SubclavianPSV cm/sEDV cm/sDescribe        Arm Pressure +----------+--------+--------+----------------+------------+           268             Multiphasic, WNL             +----------+--------+--------+----------------+------------+ +---------+--------+--+--------+--+---------+ VertebralPSV cm/s80EDV cm/s21Antegrade  +---------+--------+--+--------+--+---------+  ABI Findings: +---------+------------------+-----+---------+--------+ Right    Rt Pressure (mmHg)IndexWaveform Comment  +---------+------------------+-----+---------+--------+ Brachial 126                    triphasic         +---------+------------------+-----+---------+--------+ PTA      137               1.09 triphasic         +---------+------------------+-----+---------+--------+ DP       138               1.10 triphasic         +---------+------------------+-----+---------+--------+ Great Toe90                0.71 Normal            +---------+------------------+-----+---------+--------+ +---------+------------------+-----+---------+-------+ Left     Lt Pressure (mmHg)IndexWaveform Comment +---------+------------------+-----+---------+-------+  Brachial 123                    triphasic        +---------+------------------+-----+---------+-------+ PTA      132               1.05 triphasic        +---------+------------------+-----+---------+-------+ DP       125               0.99 triphasic        +---------+------------------+-----+---------+-------+ Great Toe70                0.56 Abnormal         +---------+------------------+-----+---------+-------+ +-------+---------------+----------------+ ABI/TBIToday's ABI/TBIPrevious ABI/TBI +-------+---------------+----------------+ Right  1.10/0.71                       +-------+---------------+----------------+ Left   1.05/0.56                       +-------+---------------+----------------+  Right Doppler Findings: +--------+--------+-----+---------+--------+ Site    PressureIndexDoppler  Comments +--------+--------+-----+---------+--------+ [id number]          triphasic         +--------+--------+-----+---------+--------+ Radial               triphasic         +--------+--------+-----+---------+--------+ Ulnar                 triphasic         +--------+--------+-----+---------+--------+  Left Doppler Findings: +--------+--------+-----+---------+--------+ Site    PressureIndexDoppler  Comments +--------+--------+-----+---------+--------+ [id number]          triphasic         +--------+--------+-----+---------+--------+ Radial               triphasic         +--------+--------+-----+---------+--------+ Ulnar                triphasic         +--------+--------+-----+---------+--------+  Summary: Right Carotid: Velocities in the right ICA are consistent with a 1-39% stenosis. Left Carotid: Velocities in the left ICA are consistent with a 1-39% stenosis. Vertebrals:  Bilateral vertebral arteries demonstrate antegrade flow. Subclavians: Normal flow hemodynamics were seen in bilateral subclavian              arteries. Right ABI: Resting right ankle-brachial index is within normal range. No evidence of significant right lower extremity arterial disease. The right toe-brachial index is normal. Left ABI: Resting left ankle-brachial index is within normal range. No evidence of significant left lower extremity arterial disease. The left toe-brachial index is abnormal. Right Upper Extremity: Doppler waveforms remain within normal limits with right radial compression. Doppler waveforms remain within normal limits with right ulnar compression. Left Upper Extremity: Doppler waveforms remain within normal limits with left radial compression. Doppler waveforms remain within normal limits with left ulnar compression.     Preliminary     Cardiac Studies   Echocardiogram 11/30/2020: Impressions: 1. Left ventricular ejection fraction, by estimation, is 55 to 60%. The  left ventricle has normal function. The left ventricle has no regional  wall motion abnormalities. Left ventricular diastolic parameters were  normal.  2. Right ventricular systolic function is normal. The right ventricular  size is normal.  3. The  mitral valve is normal in structure. Trivial mitral valve  regurgitation. No evidence of mitral stenosis.  4. The aortic valve is normal in structure. Aortic valve  regurgitation is  not visualized. No aortic stenosis is present.  5. The inferior vena cava is normal in size with greater than 50%  respiratory variability, suggesting right atrial pressure of 3 mmHg. _______________  Cardiac Catheterization 11/30/2020:  Widely patent left main  Heavily calcified left main, and LAD. LAD contains significant tortuosity. In the distal third there is 50% narrowing followed by an area of ectasia and then 70% narrowing near the apex. Beyond this 3 moderate-sized diagonals are arise.  Circumflex gives origin to 3 obtuse marginals. The first is moderate in size and contains a region of angulation in which there is a 90% stenosis.  A large ramus intermedius is tortuous and contains proximal 95% stenosis. The stenosis is within an acute bend.  Right coronary is diffusely diseased proximally with 90% stenosis. The vessel is small in diameter. PDA is totally occluded and fills by right to right and left-to-right collaterals.  Normal LV function.  Numerous left ventricular AV fistula a cause left ventricular filling during left coronary injection.  Recommendations:  Calcific and ectatic coronary disease with marked tortuosity making any PCI procedure very difficult. The LAD does not contain significant proximal disease. There is moderate distal disease. Would recommend consideration of coronary bypass grafting for ramus, circumflex, PDA, and distal LAD. If this is not possible, perhaps he ramus intermedius could be stented although will be difficult due to tortuosity and calcification.  Diagnostic Dominance: Right      Patient Profile     75 y.o. 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  Assessment & Plan    #NSTEMI: #Multivessel CAD Patient presented with chest pain found to have  troponin elevated at 23 >> 188 >> 3,858 >> 5,267. TTE showed LVEF of 55-60% with normal wall motion and diastolic function. No significant valvular disease.  LHC showed severe multivessel CAD including heavily calcified left main and LAD. Now awaiting CABG - CT surgery was consulted and plan is for CABG. Currently on the board for 12/05/2020.  -Continue IV Heparin drip -Continue aspirin 81mg  daily -No P2Y12 inhibitor due to plan for surgery -Continue metop 12.5mg  BID--change to long-acting prior to discharge -Continue crestor 40mg  daily -Plan for ACE/ARB prior to discharge pending renal function/BP -Plan for Cardiac rehab  #Hypertension  Controlled. - Continue Amlodipine 10mg  daily and Lopressor 12.5mg  twice daily  #Hyperlipidemia Lipid panel this admission: Total Cholesterol 302, Triglycerides 108, HDL 54, LDL 226.  LDL goal <70 given CAD. - Continue Crestor 40mg --patient was not taking prior to admission - Will need repeat lipid panel/LFTs in 6-8 weeks.  #Type 2 Diabetes Mellitus - Hemoglobin A1c 11.8 this admission. - Management per primary team.  #CKD Stage III Baseline 1.2 to 1.6. - Avoid nephrotoxic agents - Continue to monitor  #Anemia - Hemoglobin 8.7 today. Down from 11.2 on admission. Baseline in the 10 range.  - Management per primary team.  #Cough Was recently prescribed Azithromycin by her PCP but this was stopped on admission.  - On mucinex per primary team    For questions or updates, please contact [facility] Please consult [email or web address] for contact info under        Signed, Freada Bergeron, MD  12/03/2020, 7:42 AM

## 2020-12-02 NOTE — Progress Notes (Signed)
I notified Brantley, RT that patient needed PFT for surgery on Monday

## 2020-12-02 NOTE — Progress Notes (Signed)
IS and cardiac surgery book have been given to pt.  Idolina Primer, RN

## 2020-12-02 NOTE — Progress Notes (Signed)
Pre-CABG study completed.   Please see CV Proc for preliminary results.   Armarion Greek, RDMS  

## 2020-12-02 NOTE — Progress Notes (Signed)
2 Days Post-Op Procedure(s) (LRB): LEFT HEART CATH AND CORONARY ANGIOGRAPHY (N/A) Subjective: No CP or SOB, c/o cough, has Tessalon ordered  Objective: Vital signs in last 24 hours: Temp:  [97.8 F (36.6 C)-98.9 F (37.2 C)] 97.8 F (36.6 C) (02/11 0456) Pulse Rate:  [69-91] 75 (02/11 0817) Cardiac Rhythm: Normal sinus rhythm (02/11 0815) Resp:  [16] 16 (02/11 0456) BP: (92-146)/(57-76) 114/60 (02/11 0817) SpO2:  [100 %] 100 % (02/11 0456) Weight:  [40.9 kg] 40.9 kg (02/11 0456)  Hemodynamic parameters for last 24 hours:    Intake/Output from previous day: 02/10 0701 - 02/11 0700 In: 240 [P.O.:240] Out: -  Intake/Output this shift: No intake/output data recorded.  General appearance: alert, cooperative and no distress Neurologic: intact Heart: regular rate and rhythm Lungs: clear to auscultation bilaterally  Lab Results: Recent Labs    12/01/20 0124 12/02/20 0039  WBC 5.1 4.8  HGB 9.4* 8.9*  HCT 29.3* 27.9*  PLT 201 197   BMET:  Recent Labs    12/01/20 0124 12/02/20 0039  NA 137 138  K 3.9 4.3  CL 110 109  CO2 21* 23  GLUCOSE 98 102*  BUN 30* 36*  CREATININE 1.44* 1.59*  CALCIUM 9.5 9.9    PT/INR: No results for input(s): LABPROT, INR in the last 72 hours. ABG No results found for: PHART, HCO3, TCO2, ACIDBASEDEF, O2SAT CBG (last 3)  Recent Labs    12/01/20 1657 12/01/20 2208 12/02/20 0814  GLUCAP 162* 278* 99    Assessment/Plan: S/P Procedure(s) (LRB): LEFT HEART CATH AND CORONARY ANGIOGRAPHY (N/A) -After discussion with her family she has decided to proceed with CABG by DrKipp Brood on Monday. Orders written   LOS: 2 days    Melrose Nakayama 12/02/2020

## 2020-12-02 NOTE — Progress Notes (Addendum)
Initial Nutrition Assessment  DOCUMENTATION CODES:   Underweight  INTERVENTION:    Glucerna Shake po TID, each supplement provides 220 kcal and 10 grams of protein  MVI with minerals daily  NUTRITION DIAGNOSIS:   Increased nutrient needs related to chronic illness as evidenced by estimated needs.  GOAL:   Patient will meet greater than or equal to 90% of their needs  MONITOR:   PO intake,Supplement acceptance,Labs  REASON FOR ASSESSMENT:   Consult Assessment of nutrition requirement/status  ASSESSMENT:   75 yo female admitted with non-STEMI. PMH includes DM-2, HTN, HLD, glaucoma.   LHC revealed severe multivessel CAD. Plans for CABG next week.    Currently on a heart healthy diet.  Meal intakes not recorded.  Weight history reviewed. 13% weight loss within the past 6 months is severe. Underweight with BMI=18.2 Suspect patient is malnourished; unable to obtain enough information at this time for identification of malnutrition.   Labs reviewed.  CBG: 162-278  Medications reviewed and include Os-Cal, Novolog, Lantus, Crestor.   NUTRITION - FOCUSED PHYSICAL EXAM:  unable to complete  Diet Order:   Diet Order            Diet Heart Room service appropriate? Yes; Fluid consistency: Thin  Diet effective now                 EDUCATION NEEDS:   Not appropriate for education at this time  Skin:  Skin Assessment: Reviewed RN Assessment  Last BM:  2/7  Height:   Ht Readings from Last 1 Encounters:  11/30/20 4\' 11"  (1.499 m)    Weight:   Wt Readings from Last 1 Encounters:  12/02/20 40.9 kg    Ideal Body Weight:  44.7 kg  BMI:  Body mass index is 18.22 kg/m.  Estimated Nutritional Needs:   Kcal:  1250-1450  Protein:  60-70 gm  Fluid:  1.5 L    Lucas Mallow, RD, LDN, CNSC Please refer to Amion for contact information.

## 2020-12-02 NOTE — Progress Notes (Signed)
PROGRESS NOTE  Mariah Peterson QBH:[id number] DOB: 03-12-1946   PCP: Marrian Salvage, FNP  Patient is from: Home  DOA: 11/29/2020 LOS: 2  Chief complaints: Chest pain  Brief Narrative / Interim history: 75 year old female with history of uncontrolled DM-2, HTN and HLD presenting with substernal chest pain and admitted for non-STEMI. Troponin 23>> 188> 3900. Cardiology consulted.  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  TTE reassuring.  Plan for CABG on 2/14.  Subjective: Seen and examined earlier this morning.  No major events overnight of this morning.  No complaints.  She denies chest pain, dyspnea, GI or UTI symptoms.  Anxious about upcoming CABG. Objective: Vitals:   12/02/20 0456 12/02/20 0817 12/02/20 0854 12/02/20 1130  BP: (!) 105/59 114/60 (!) 137/58 (!) 121/55  Pulse: 74 75 73 76  Resp: 16  20 18   Temp: 97.8 F (36.6 C)  99.8 F (37.7 C) 99.1 F (37.3 C)  TempSrc: Oral  Oral Oral  SpO2: 100%     Weight: 40.9 kg     Height:        Intake/Output Summary (Last 24 hours) at 12/02/2020 1433 Last data filed at 12/01/2020 2130 Gross per 24 hour  Intake 240 ml  Output -  Net 240 ml   Filed Weights   11/30/20 1340 12/01/20 0338 12/02/20 0456  Weight: 39.8 kg 40.3 kg 40.9 kg    Examination:  GENERAL: No apparent distress.  Nontoxic. HEENT: MMM.  Vision and hearing grossly intact.  NECK: Supple.  No apparent JVD.  RESP: On RA.  No IWOB.  Fair aeration bilaterally. CVS:  RRR. Heart sounds normal.  ABD/GI/GU: BS+. Abd soft, NTND.  MSK/EXT:  Moves extremities. No edema.  Significant muscle mass and subcu fat loss. SKIN: no apparent skin lesion or wound NEURO: Awake, alert and oriented appropriately.  No apparent focal neuro deficit. PSYCH: Calm. Normal affect.  Procedures:  11/30/2020-LHC with calcific and ectatic coronary disease with marked tortuosity making any PCI procedure very  difficult.  Microbiology summarized: COVID-19 PCR nonreactive.  Assessment & Plan: Non-STEMI: She has typical chest pain with dynamic EKG changes and rising troponin.  Has been chest pain-free over 24 hours.  TTE reassuring.  LHC as above. -Plan for CABG surgery on 2/14. -Continue heparin drip, aspirin, metoprolol and Crestor -Continue nitro patch as needed for chest pain  Uncontrolled IDDM-2 with hyperglycemia: On Lantus, Ozempic and Actos at home. May be noncompliance. A1c 11.8 from 11.0 about three months prior. Recent Labs  Lab 12/01/20 2208 12/02/20 0814 12/02/20 0854 12/02/20 0943 12/02/20 1128  GLUCAP 278* 99 125* 197* 158*  -Continue SSI  -Could benefit from SGLT2 inhibitors -Hold home Actos and Ozempic. -Continue statin as above.  Essential hypertension: Normotensive. -Continue home amlodipine -Continue metoprolol-started here.  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 Recent Labs    12/28/19 1516 03/29/20 1458 08/15/20 1034 09/23/20 0947 10/10/20 1144 11/10/20 0955 11/29/20 1638 11/30/20 0910 12/01/20 0124 12/02/20 0039  BUN 31* 26* 25* 26* 38* 30* 39* 35* 30* 36*  CREATININE 1.48* 1.28* 1.51* 1.62* 1.88* 1.55* 1.85* 1.58* 1.44* 1.59*  -Continue holding HCTZ -Avoid nephrotoxic meds -Monitor.  Hyperlipidemia: TC 302. HDL 54. LDL 226. -Continue high intensity statin  Elevated D-dimer: Nonspecific but low clinical suspicion for PE or DVT  Cough and congestion: Started on Z-Pak by PCP on the day of admission. No respiratory distress. -Discontinued Z-Pak -Mucinex  Underweight -Ozempic might contribute to  weight loss. Body mass index is 18.22 kg/m. Nutrition Problem: Increased nutrient needs Etiology: chronic illness  Wt Readings from Last 10 Encounters:  12/02/20 40.9 kg  11/15/20 39.5 kg  10/10/20 39.9 kg  09/28/20 41.9 kg  08/18/20 43.7 kg  05/28/20 46.9 kg  05/18/20 46.3 kg  04/01/20 48.5 kg  12/31/19 46.2 kg  11/16/19 44.2 kg  -Consult  dietitian. Signs/Symptoms: estimated needs Interventions: MVI,Glucerna shake   DVT prophylaxis:  SCD's Start: 11/30/20 1633On heparin drip.  Code Status: Full code Family Communication: Updated patient's husband at bedside on 2/10. Level of care: Telemetry Cardiac Status is: Inpatient  Remains inpatient appropriate because:Ongoing diagnostic testing needed not appropriate for outpatient work up, IV treatments appropriate due to intensity of illness or inability to take PO and Inpatient level of care appropriate due to severity of illness   Dispo: The patient is from: Home              Anticipated d/c is to: Home              Anticipated d/c date is: > 3 days              Patient currently is not medically stable to d/c.   Difficult to place patient No           Consultants:  Cardiology Cardiothoracic surgery   Sch Meds:  Scheduled Meds: . amLODipine  10 mg Oral Daily  . aspirin EC  81 mg Oral Daily  . [START ON 12/04/2020] bisacodyl  5 mg Oral Once  . calcium carbonate  1 tablet Oral BID WC  . [START ON 12/05/2020] chlorhexidine  15 mL Mouth/Throat Once  . [START ON 12/05/2020] diazepam  2 mg Oral Once  . [START ON 12/05/2020] epinephrine  0-10 mcg/min Intravenous To OR  . feeding supplement (GLUCERNA SHAKE)  237 mL Oral TID BM  . [START ON 12/05/2020] heparin-papaverine-plasmalyte irrigation   Irrigation To OR  . insulin aspart  0-15 Units Subcutaneous TID WC  . insulin aspart  0-5 Units Subcutaneous QHS  . insulin glargine  16 Units Subcutaneous Daily  . [START ON 12/05/2020] insulin   Intravenous To OR  . [START ON 12/05/2020] [place] Blood Cardioplegia vial (lidocaine/magnesium/mannitol 0.26g-4g-6.4g)   Intracoronary Once  . loratadine  10 mg Oral Daily  . metoprolol tartrate  12.5 mg Oral BID  . multivitamin with minerals  1 tablet Oral Daily  . nitroGLYCERIN  1 inch Topical Q6H  . [START ON 12/05/2020] phenylephrine  30-200 mcg/min Intravenous To OR  . [START  ON 12/05/2020] potassium chloride  80 mEq Other To OR  . rosuvastatin  40 mg Oral Daily  . sodium chloride flush  3 mL Intravenous Q12H  . sodium chloride flush  3 mL Intravenous Q12H  . [START ON 12/05/2020] tranexamic acid  15 mg/kg Intravenous To OR  . [START ON 12/05/2020] tranexamic acid  2 mg/kg Intracatheter To OR   Continuous Infusions: . sodium chloride    . [START ON 12/05/2020] cefUROXime (ZINACEF)  IV    . [START ON 12/05/2020] cefUROXime (ZINACEF)  IV    . [START ON 12/05/2020] dexmedetomidine    . [START ON 12/05/2020] heparin 30,000 units/NS 1000 mL solution for CELLSAVER    . heparin 550 Units/hr (12/02/20 1213)  . [START ON 12/05/2020] milrinone    . [START ON 12/05/2020] nitroGLYCERIN    . [START ON 12/05/2020] norepinephrine    . [START ON 12/05/2020] tranexamic acid (CYKLOKAPRON) infusion ([facility])    . [  START ON 12/05/2020] vancomycin     PRN Meds:.sodium chloride, acetaminophen, benzonatate, morphine injection, ondansetron (ZOFRAN) IV, oxyCODONE, sodium chloride flush, [START ON 12/04/2020] temazepam  Antimicrobials: Anti-infectives (From admission, onward)   Start     Dose/Rate Route Frequency Ordered Stop   12/05/20 0000  vancomycin (VANCOREADY) IVPB 1250 mg/250 mL  Status:  Discontinued        1,250 mg 166.7 mL/hr over 90 Minutes Intravenous To Surgery 12/02/20 1044 12/02/20 1056   12/05/20 0000  cefUROXime (ZINACEF) 1.5 g in sodium chloride 0.9 % 100 mL IVPB        1.5 g 200 mL/hr over 30 Minutes Intravenous To Surgery 12/02/20 1044 12/06/20 0000   12/05/20 0000  cefUROXime (ZINACEF) 750 mg in sodium chloride 0.9 % 100 mL IVPB        750 mg 200 mL/hr over 30 Minutes Intravenous To Surgery 12/02/20 1044 12/06/20 0000   12/05/20 0000  vancomycin (VANCOREADY) IVPB 1000 mg/200 mL        1,000 mg 200 mL/hr over 60 Minutes Intravenous To Surgery 12/02/20 1056 12/06/20 0000   12/01/20 1000  azithromycin (ZITHROMAX) tablet 250 mg  Status:  Discontinued        250 mg Oral Daily  11/30/20 0819 11/30/20 1201   11/30/20 1000  azithromycin (ZITHROMAX) tablet 500 mg       Note to Pharmacy: 2 tabs po qd x 1 day; 1 tablet per day x 4 days;     500 mg Oral Daily 11/30/20 0327 12/01/20 0959       I have personally reviewed the following labs and images: CBC: Recent Labs  Lab 11/29/20 1638 11/30/20 0514 12/01/20 0124 12/02/20 0039  WBC 6.0 4.8 5.1 4.8  HGB 11.2* 9.9* 9.4* 8.9*  HCT 32.4* 31.8* 29.3* 27.9*  MCV 91.3 95.2 92.4 92.7  PLT 237 214 201 197   BMP &GFR Recent Labs  Lab 11/29/20 1638 11/30/20 0910 12/01/20 0124 12/02/20 0039  NA 133* 137 137 138  K 4.2 3.6 3.9 4.3  CL 100 104 110 109  CO2 20* 22 21* 23  GLUCOSE 377* 124* 98 102*  BUN 39* 35* 30* 36*  CREATININE 1.85* 1.58* 1.44* 1.59*  CALCIUM 9.6 9.6 9.5 9.9  MG  --   --  2.0 1.9  PHOS  --   --  4.5 4.0   Estimated Creatinine Clearance: 20.3 mL/min (A) (by C-G formula based on SCr of 1.59 mg/dL (H)). Liver & Pancreas: Recent Labs  Lab 11/29/20 1711 12/01/20 0124 12/02/20 0039  AST 21  --   --   ALT 18  --   --   ALKPHOS 44  --   --   BILITOT 0.6  --   --   PROT 6.6  --   --   ALBUMIN 3.5 2.6* 2.7*   Recent Labs  Lab 11/29/20 1711  LIPASE 111*   No results for input(s): AMMONIA in the last 168 hours. Diabetic: No results for input(s): HGBA1C in the last 72 hours. Recent Labs  Lab 12/01/20 2208 12/02/20 0814 12/02/20 0854 12/02/20 0943 12/02/20 1128  GLUCAP 278* 99 125* 197* 158*   Cardiac Enzymes: No results for input(s): CKTOTAL, CKMB, CKMBINDEX, TROPONINI in the last 168 hours. No results for input(s): PROBNP in the last 8760 hours. Coagulation Profile: No results for input(s): INR, PROTIME in the last 168 hours. Thyroid Function Tests: No results for input(s): TSH, T4TOTAL, FREET4, T3FREE, THYROIDAB in the last 72 hours. Lipid Profile:  Recent Labs    11/30/20 0514  CHOL 302*  HDL 54  LDLCALC 226*  TRIG 108  CHOLHDL 5.6   Anemia Panel: No results for  input(s): VITAMINB12, FOLATE, FERRITIN, TIBC, IRON, RETICCTPCT in the last 72 hours. Urine analysis:    Component Value Date/Time   COLORURINE YELLOW 12/02/2020 [place] 12/02/2020 1308   LABSPEC 1.020 12/02/2020 1308   PHURINE 5.0 12/02/2020 1308   GLUCOSEU 50 (A) 12/02/2020 1308   HGBUR NEGATIVE 12/02/2020 [place] 12/02/2020 1308   BILIRUBINUR neg 03/22/2014 1025   [place] 12/02/2020 1308   PROTEINUR NEGATIVE 12/02/2020 1308   UROBILINOGEN 0.2 03/22/2014 1025   UROBILINOGEN 0.2 02/13/2014 0241   NITRITE NEGATIVE 12/02/2020 1308   LEUKOCYTESUR NEGATIVE 12/02/2020 1308   Sepsis Labs: Invalid input(s): PROCALCITONIN, [place]  Microbiology: Recent Results (from the past 240 hour(s))  Resp Panel by RT-PCR (Flu A&B, Covid) Nasopharyngeal Swab     Status: None   Collection Time: 11/29/20  5:13 PM   Specimen: Nasopharyngeal Swab; Nasopharyngeal(NP) swabs in vial transport medium  Result Value Ref Range Status   SARS Coronavirus 2 by RT PCR NEGATIVE NEGATIVE Final    Comment: (NOTE) SARS-CoV-2 target nucleic acids are NOT DETECTED.  The SARS-CoV-2 RNA is generally detectable in upper respiratory specimens during the acute phase of infection. The lowest concentration of SARS-CoV-2 viral copies this assay can detect is 138 copies/mL. A negative result does not preclude SARS-Cov-2 infection and should not be used as the sole basis for treatment or other patient management decisions. A negative result may occur with  improper specimen collection/handling, submission of specimen other than nasopharyngeal swab, presence of viral mutation(s) within the areas targeted by this assay, and inadequate number of viral copies(<138 copies/mL). A negative result must be combined with clinical observations, patient history, and epidemiological information. The expected result is Negative.  Fact Sheet for Patients:   EntrepreneurPulse.com.au  Fact Sheet for Healthcare Providers:  IncredibleEmployment.be  This test is no t yet approved or cleared by the [place] FDA and  has been authorized for detection and/or diagnosis of SARS-CoV-2 by FDA under an Emergency Use Authorization (EUA). This EUA will remain  in effect (meaning this test can be used) for the duration of the COVID-19 declaration under Section 564(b)(1) of the Act, 21 U.S.C.section 360bbb-3(b)(1), unless the authorization is terminated  or revoked sooner.       Influenza A by PCR NEGATIVE NEGATIVE Final   Influenza B by PCR NEGATIVE NEGATIVE Final    Comment: (NOTE) The Xpert Xpress SARS-CoV-2/FLU/RSV plus assay is intended as an aid in the diagnosis of influenza from Nasopharyngeal swab specimens and should not be used as a sole basis for treatment. Nasal washings and aspirates are unacceptable for Xpert Xpress SARS-CoV-2/FLU/RSV testing.  Fact Sheet for Patients: EntrepreneurPulse.com.au  Fact Sheet for Healthcare Providers: IncredibleEmployment.be  This test is not yet approved or cleared by the [place] FDA and has been authorized for detection and/or diagnosis of SARS-CoV-2 by FDA under an Emergency Use Authorization (EUA). This EUA will remain in effect (meaning this test can be used) for the duration of the COVID-19 declaration under Section 564(b)(1) of the Act, 21 U.S.C. section 360bbb-3(b)(1), unless the authorization is terminated or revoked.  Performed at [facility], [place] [address], [place], [place] [address]     Radiology Studies: No results found.    Mariah Peterson  If 7PM-7AM, please contact night-coverage  [email or web address] 12/02/2020, 2:33 PM

## 2020-12-03 ENCOUNTER — Inpatient Hospital Stay (HOSPITAL_COMMUNITY): Payer: Medicare Other

## 2020-12-03 DIAGNOSIS — I214 Non-ST elevation (NSTEMI) myocardial infarction: Secondary | ICD-10-CM | POA: Diagnosis not present

## 2020-12-03 LAB — CBC
HCT: 25.6 % — ABNORMAL LOW (ref 36.0–46.0)
Hemoglobin: 8.7 g/dL — ABNORMAL LOW (ref 12.0–15.0)
MCH: 30.9 pg (ref 26.0–34.0)
MCHC: 34 g/dL (ref 30.0–36.0)
MCV: 90.8 fL (ref 80.0–100.0)
Platelets: 180 10*3/uL (ref 150–400)
RBC: 2.82 MIL/uL — ABNORMAL LOW (ref 3.87–5.11)
RDW: 13.4 % (ref 11.5–15.5)
WBC: 4.1 10*3/uL (ref 4.0–10.5)
nRBC: 0 % (ref 0.0–0.2)

## 2020-12-03 LAB — GLUCOSE, CAPILLARY
Glucose-Capillary: 174 mg/dL — ABNORMAL HIGH (ref 70–99)
Glucose-Capillary: 351 mg/dL — ABNORMAL HIGH (ref 70–99)
Glucose-Capillary: 92 mg/dL (ref 70–99)
Glucose-Capillary: 282 mg/dL — ABNORMAL HIGH (ref 70–99)

## 2020-12-03 LAB — BASIC METABOLIC PANEL
Anion gap: 10 (ref 5–15)
BUN: 39 mg/dL — ABNORMAL HIGH (ref 8–23)
CO2: 21 mmol/L — ABNORMAL LOW (ref 22–32)
Calcium: 10 mg/dL (ref 8.9–10.3)
Chloride: 107 mmol/L (ref 98–111)
Creatinine, Ser: 1.74 mg/dL — ABNORMAL HIGH (ref 0.44–1.00)
GFR, Estimated: 31 mL/min — ABNORMAL LOW (ref >60)
Glucose, Bld: 226 mg/dL — ABNORMAL HIGH (ref 70–99)
Potassium: 4.5 mmol/L (ref 3.5–5.1)
Sodium: 138 mmol/L (ref 135–145)

## 2020-12-03 LAB — BLOOD GAS, ARTERIAL
Acid-base deficit: 3.9 mmol/L — ABNORMAL HIGH (ref 0.0–2.0)
Bicarbonate: 20.3 mmol/L (ref 20.0–28.0)
Drawn by: 358491
FIO2: 21
O2 Saturation: 96.7 %
Patient temperature: 37
pCO2 arterial: 35.3 mmHg (ref 32.0–48.0)
pH, Arterial: 7.379 (ref 7.350–7.450)
pO2, Arterial: 87.1 mmHg (ref 83.0–108.0)

## 2020-12-03 LAB — MAGNESIUM: Magnesium: 1.9 mg/dL (ref 1.7–2.4)

## 2020-12-03 LAB — APTT: aPTT: 84 seconds — ABNORMAL HIGH (ref 24–36)

## 2020-12-03 LAB — HEMOGLOBIN A1C
Hgb A1c MFr Bld: 11.8 % — ABNORMAL HIGH (ref 4.8–5.6)
Mean Plasma Glucose: 291.96 mg/dL

## 2020-12-03 LAB — PROTIME-INR
INR: 1.1 (ref 0.8–1.2)
Prothrombin Time: 13.8 seconds (ref 11.4–15.2)

## 2020-12-03 LAB — HEPARIN LEVEL (UNFRACTIONATED): Heparin Unfractionated: 0.3 IU/mL (ref 0.30–0.70)

## 2020-12-03 MED ORDER — NITROGLYCERIN 2 % TD OINT
1.0000 [in_us] | TOPICAL_OINTMENT | Freq: Four times a day (QID) | TRANSDERMAL | Status: DC | PRN
  Administered 2020-12-03: 1 [in_us] via TOPICAL
  Filled 2020-12-03: qty 30

## 2020-12-03 MED ORDER — INSULIN GLARGINE 100 UNIT/ML ~~LOC~~ SOLN
18.0000 [IU] | Freq: Every day | SUBCUTANEOUS | Status: DC
  Administered 2020-12-04: 18 [IU] via SUBCUTANEOUS
  Filled 2020-12-03 (×2): qty 0.18

## 2020-12-03 NOTE — Progress Notes (Signed)
CARDIAC REHAB PHASE I   PRE:  Rate/Rhythm: 79 SR  BP:  Supine:   Sitting: 145/64  Standing:    SaO2: 99% RA  MODE:  Ambulation: 220 ft   POST:  Rate/Rhythm: 90 SR  BP:  Supine:   Sitting: 155/71  Standing:    SaO2: 98% RA  [id number] Patient very pleasant and eager to walk. Patient tolerated ambulation well, gait slow, steady, denies CP, denies SOB, no symptoms. Blood pressure mildly elevated before and after ambulation.  Patient has IS in room and demonstrated proper use. Discussed sternal precautions, mobility, and post-op care. Patient has [facility] book in room, Safe Movement handout and care guide given. Patient verbalizes understanding and appreciative of instructions given.   Sol Passer, MS, ACSM CEP

## 2020-12-03 NOTE — Progress Notes (Signed)
[place] for Heparin Indication: chest pain/ACS  No Known Allergies  Patient Measurements: Height: 4\' 11"  (149.9 cm) Weight: 42.3 kg (93 lb 3.2 oz) IBW/kg (Calculated) : 43.2 Heparin Dosing Weight: 39.9 kg  Vital Signs: Temp: 98.7 F (37.1 C) (02/12 0446) Temp Source: Oral (02/12 0446) BP: 107/53 (02/12 0446)  Labs: Recent Labs     0000 11/30/20 0910 12/01/20 0124 12/01/20 0928 12/01/20 1648 12/02/20 0039 12/03/20 0338  HGB   < >  --  9.4*  --   --  8.9* 8.7*  HCT  --   --  29.3*  --   --  27.9* 25.6*  PLT  --   --  201  --   --  197 180  APTT  --   --   --   --   --   --  84*  LABPROT  --   --   --   --   --   --  13.8  INR  --   --   --   --   --   --  1.1  HEPARINUNFRC  --   --   --    < > 0.40 0.30 0.30  CREATININE  --  1.58* 1.44*  --   --  1.59*  --   TROPONINIHS  --  5,267*  --   --   --   --   --    < > = values in this interval not displayed.    Estimated Creatinine Clearance: 21 mL/min (A) (by C-G formula based on SCr of 1.59 mg/dL (H)).   Medical History: Past Medical History:  Diagnosis Date  . Chest pain 11/30/2020  . Diabetes mellitus without complication ([place])   . Glaucoma   . History of chicken pox   . Hypertension     Assessment: 75 yr old female presents with chest pain and elevated troponin. The pt was was transitioned from enoxaparin to heparin post cath and is now planned for CABG on 2/14. PTA the patient is not on anticoagulation. Pharmacy is consulted to dose heparin.   Heparin level this morning is therapeutic at 0.30. Hgb 8.7, platelets 180. Per the RN, there have been no issues with the infusion and the patient is without signs or symptoms of bleeding.   Goal of Therapy:  Heparin level 0.3-0.7 units/ml Monitor platelets by anticoagulation protocol: Yes   Plan:  -Increase heparin IV to 600 units/hr to keep heparin level in range -Monitor a daily heparin level and CBC -Monitor for signs and  symptoms of bleeding  Shauna Hugh, PharmD, [place]  PGY-1 Pharmacy Resident 12/03/2020 7:50 AM  Please check [email or web address] for unit-specific pharmacy phone numbers.

## 2020-12-03 NOTE — Progress Notes (Signed)
PROGRESS NOTE    Mariah Peterson  QQP:[id number]  DOB: 01-26-1946  DOA: 11/29/2020 PCP: Marrian Salvage, FNP Outpatient Specialists:   Hospital course:  75 year old female with history of uncontrolled DM-2, HTN and HLD presenting with substernal chest pain and admitted for non-STEMI. Troponin 23>> 188> 3900. Cardiology consulted.  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  TTE reassuring.  Plan for CABG on 2/14.  Subjective:  Patient is in excellent spirits reading her Bible.  She has no acute concerns.  No chest pain, thinks the nitroglycerin has been very helpful.  No shortness of breath.   Objective: Vitals:   12/02/20 1700 12/02/20 2046 12/03/20 0446 12/03/20 0831  BP: (!) 136/59 (!) 122/58 (!) 107/53 134/60  Pulse: 86   80  Resp: 18     Temp: 99.1 F (37.3 C) 98 F (36.7 C) 98.7 F (37.1 C)   TempSrc: Oral Oral Oral   SpO2: 100%     Weight:   42.3 kg   Height:        Intake/Output Summary (Last 24 hours) at 12/03/2020 1556 Last data filed at 12/03/2020 1200 Gross per 24 hour  Intake 460 ml  Output --  Net 460 ml   Filed Weights   12/01/20 0338 12/02/20 0456 12/03/20 0446  Weight: 40.3 kg 40.9 kg 42.3 kg     Exam:  General: Well-appearing female sitting up in bed reading her Bible in no acute distress.  In good spirits Eyes: sclera anicteric, conjuctiva mild injection bilaterally CVS: S1-S2, regular  Respiratory:  decreased air entry bilaterally secondary to decreased inspiratory effort, rales at bases  GI: NABS, soft, NT  LE: No edema.  Neuro: A/O x 3, Moving all extremities equally with normal strength, CN 3-12 intact, grossly nonfocal.  Psych: patient is logical and coherent, judgement and insight appear normal, mood and affect appropriate to situation.   Assessment & Plan:   Acute coronary syndrome Troponin peaked at 3900. Underwent left heart cath which showed  calcified and tortuous vessels making PCI difficult. Plan is for CABG on Monday, 12/05/2020 Echocardiogram Continue heparin drip, aspirin, metoprolol and rosuvastatin  DM2 Patient with poorly controlled diabetes at home with hemoglobin A1c 11.8 Blood sugars remain elevated here Will increase glargine to 18 units and continue SSI Consult to diabetes coordinator placed  HTN Continue metoprolol and amlodipine  CKD 3B Stable    DVT prophylaxis: On IV heparin Code Status: Full Family Communication: None today Disposition Plan:   Patient is from: Home  Anticipated Discharge Location: Home  Barriers to Discharge: Awaiting CABG tomorrow  Is patient medically stable for Discharge: No   Consultants:  Cardiology  Procedures: 11/30/2020-LHC with calcific and ectatic coronary disease with marked tortuosity making any PCI procedure very difficult.  Antimicrobials:  None   Data Reviewed:  Basic Metabolic Panel: Recent Labs  Lab 11/29/20 1638 11/30/20 0910 12/01/20 0124 12/02/20 0039 12/03/20 0816  NA 133* 137 137 138 138  K 4.2 3.6 3.9 4.3 4.5  CL 100 104 110 109 107  CO2 20* 22 21* 23 21*  GLUCOSE 377* 124* 98 102* 226*  BUN 39* 35* 30* 36* 39*  CREATININE 1.85* 1.58* 1.44* 1.59* 1.74*  CALCIUM 9.6 9.6 9.5 9.9 10.0  MG  --   --  2.0 1.9 1.9  PHOS  --   --  4.5 4.0  --    Liver Function Tests: Recent Labs  Lab 11/29/20 1711 12/01/20 0124 12/02/20 0039  AST 21  --   --   ALT 18  --   --   ALKPHOS 44  --   --   BILITOT 0.6  --   --   PROT 6.6  --   --   ALBUMIN 3.5 2.6* 2.7*   Recent Labs  Lab 11/29/20 1711  LIPASE 111*   No results for input(s): AMMONIA in the last 168 hours. CBC: Recent Labs  Lab 11/29/20 1638 11/30/20 0514 12/01/20 0124 12/02/20 0039 12/03/20 0338  WBC 6.0 4.8 5.1 4.8 4.1  HGB 11.2* 9.9* 9.4* 8.9* 8.7*  HCT 32.4* 31.8* 29.3* 27.9* 25.6*  MCV 91.3 95.2 92.4 92.7 90.8  PLT 237 214 201 197 180   Cardiac Enzymes: No results  for input(s): CKTOTAL, CKMB, CKMBINDEX, TROPONINI in the last 168 hours. BNP (last 3 results) No results for input(s): PROBNP in the last 8760 hours. CBG: Recent Labs  Lab 12/02/20 1128 12/02/20 1655 12/02/20 2046 12/03/20 0746 12/03/20 1216  GLUCAP 158* 209* 254* 174* 351*    Recent Results (from the past 240 hour(s))  Resp Panel by RT-PCR (Flu A&B, Covid) Nasopharyngeal Swab     Status: None   Collection Time: 11/29/20  5:13 PM   Specimen: Nasopharyngeal Swab; Nasopharyngeal(NP) swabs in vial transport medium  Result Value Ref Range Status   SARS Coronavirus 2 by RT PCR NEGATIVE NEGATIVE Final    Comment: (NOTE) SARS-CoV-2 target nucleic acids are NOT DETECTED.  The SARS-CoV-2 RNA is generally detectable in upper respiratory specimens during the acute phase of infection. The lowest concentration of SARS-CoV-2 viral copies this assay can detect is 138 copies/mL. A negative result does not preclude SARS-Cov-2 infection and should not be used as the sole basis for treatment or other patient management decisions. A negative result may occur with  improper specimen collection/handling, submission of specimen other than nasopharyngeal swab, presence of viral mutation(s) within the areas targeted by this assay, and inadequate number of viral copies(<138 copies/mL). A negative result must be combined with clinical observations, patient history, and epidemiological information. The expected result is Negative.  Fact Sheet for Patients:  EntrepreneurPulse.com.au  Fact Sheet for Healthcare Providers:  IncredibleEmployment.be  This test is no t yet approved or cleared by the [place] FDA and  has been authorized for detection and/or diagnosis of SARS-CoV-2 by FDA under an Emergency Use Authorization (EUA). This EUA will remain  in effect (meaning this test can be used) for the duration of the COVID-19 declaration under Section 564(b)(1) of  the Act, 21 U.S.C.section 360bbb-3(b)(1), unless the authorization is terminated  or revoked sooner.       Influenza A by PCR NEGATIVE NEGATIVE Final   Influenza B by PCR NEGATIVE NEGATIVE Final    Comment: (NOTE) The Xpert Xpress SARS-CoV-2/FLU/RSV plus assay is intended as an aid in the diagnosis of influenza from Nasopharyngeal swab specimens and should not be used as a sole basis for treatment. Nasal washings and aspirates are unacceptable for Xpert Xpress SARS-CoV-2/FLU/RSV testing.  Fact Sheet for Patients: EntrepreneurPulse.com.au  Fact Sheet for Healthcare Providers: IncredibleEmployment.be  This test is not yet approved or cleared by the [place] FDA and has been authorized for detection and/or diagnosis of SARS-CoV-2 by FDA under an Emergency Use Authorization (EUA). This EUA will remain in effect (meaning this test can be used) for the duration of the COVID-19 declaration under Section 564(b)(1) of the Act, 21 U.S.C. section 360bbb-3(b)(1), unless the  authorization is terminated or revoked.  Performed at [facility], [place] [address], [place], [place] [address]   Surgical pcr screen     Status: None   Collection Time: 12/02/20  1:20 PM   Specimen: Nasal Mucosa; Nasal Swab  Result Value Ref Range Status   MRSA, PCR NEGATIVE NEGATIVE Final   Staphylococcus aureus NEGATIVE NEGATIVE Final    Comment: (NOTE) The Xpert SA Assay (FDA approved for NASAL specimens in patients 1 years of age and older), is one component of a comprehensive surveillance program. It is not intended to diagnose infection nor to guide or monitor treatment. Performed at [facility], [place] [address], [place], [place] [address]       Studies: DG Chest 2 View  Result Date: 12/03/2020 CLINICAL DATA:  75 year old female preoperative chest. EXAM: CHEST - 2 VIEW COMPARISON:  Chest CT without contrast 11/29/2020 and earlier. FINDINGS: Lung  volumes and mediastinal contours remain normal. Visualized tracheal air column is within normal limits. Both lungs appear clear. No pneumothorax or pleural effusion. Nonobstructed visible bowel gas pattern with somewhat abundant retained stool. Abdomen Calcified aortic atherosclerosis. No acute osseous abnormality identified. IMPRESSION: No acute cardiopulmonary abnormality. Aortic atherosclerosis. Electronically Signed   By: Genevie Ann M.D.   On: 12/03/2020 07:55   VAS US DOPPLER PRE CABG  Result Date: 12/02/2020 PREOPERATIVE VASCULAR EVALUATION  Indications:      Pre-CABG. Risk Factors:     Hypertension, hyperlipidemia, Diabetes, coronary artery                   disease. Comparison Study: No prior studies. Performing Technologist: Darlin Coco RDMS  Examination Guidelines: A complete evaluation includes B-mode imaging, spectral Doppler, color Doppler, and power Doppler as needed of all accessible portions of each vessel. Bilateral testing is considered an integral part of a complete examination. Limited examinations for reoccurring indications may be performed as noted.  Right Carotid Findings: +----------+--------+--------+--------+------------+------------------+           PSV cm/sEDV cm/sStenosisDescribe    Comments           +----------+--------+--------+--------+------------+------------------+ CCA Prox  107     16                          intimal thickening +----------+--------+--------+--------+------------+------------------+ CCA Distal93      19              heterogenous                   +----------+--------+--------+--------+------------+------------------+ ICA Prox  83      19      1-39%   heterogenous                   +----------+--------+--------+--------+------------+------------------+ ICA Distal70      18                          tortuous           +----------+--------+--------+--------+------------+------------------+ ECA       102     12                                              +----------+--------+--------+--------+------------+------------------+ Portions of this table do not appear on this page. +----------+--------+-------+----------------+------------+  PSV cm/sEDV cmsDescribe        Arm Pressure +----------+--------+-------+----------------+------------+ Subclavian226            Multiphasic, WNL             +----------+--------+-------+----------------+------------+ +---------+--------+--+--------+--+---------+ VertebralPSV cm/s88EDV cm/s26Antegrade +---------+--------+--+--------+--+---------+ Left Carotid Findings: +----------+--------+--------+--------+------------+------------------+           PSV cm/sEDV cm/sStenosisDescribe    Comments           +----------+--------+--------+--------+------------+------------------+ CCA Prox  138     18                          intimal thickening +----------+--------+--------+--------+------------+------------------+ CCA Distal92      19              heterogenous                   +----------+--------+--------+--------+------------+------------------+ ICA Prox  86      18      1-39%   heterogenous                   +----------+--------+--------+--------+------------+------------------+ ICA Distal93      25                          tortuous           +----------+--------+--------+--------+------------+------------------+ ECA       168                                                    +----------+--------+--------+--------+------------+------------------+ +----------+--------+--------+----------------+------------+ SubclavianPSV cm/sEDV cm/sDescribe        Arm Pressure +----------+--------+--------+----------------+------------+           268             Multiphasic, WNL             +----------+--------+--------+----------------+------------+ +---------+--------+--+--------+--+---------+ VertebralPSV cm/s80EDV cm/s21Antegrade  +---------+--------+--+--------+--+---------+  ABI Findings: +---------+------------------+-----+---------+--------+ Right    Rt Pressure (mmHg)IndexWaveform Comment  +---------+------------------+-----+---------+--------+ Brachial 126                    triphasic         +---------+------------------+-----+---------+--------+ PTA      137               1.09 triphasic         +---------+------------------+-----+---------+--------+ DP       138               1.10 triphasic         +---------+------------------+-----+---------+--------+ Great Toe90                0.71 Normal            +---------+------------------+-----+---------+--------+ +---------+------------------+-----+---------+-------+ Left     Lt Pressure (mmHg)IndexWaveform Comment +---------+------------------+-----+---------+-------+ Brachial 123                    triphasic        +---------+------------------+-----+---------+-------+ PTA      132               1.05 triphasic        +---------+------------------+-----+---------+-------+ DP       125               0.99 triphasic        +---------+------------------+-----+---------+-------+  Great Toe70                0.56 Abnormal         +---------+------------------+-----+---------+-------+ +-------+---------------+----------------+ ABI/TBIToday's ABI/TBIPrevious ABI/TBI +-------+---------------+----------------+ Right  1.10/0.71                       +-------+---------------+----------------+ Left   1.05/0.56                       +-------+---------------+----------------+  Right Doppler Findings: +--------+--------+-----+---------+--------+ Site    PressureIndexDoppler  Comments +--------+--------+-----+---------+--------+ [id number]          triphasic         +--------+--------+-----+---------+--------+ Radial               triphasic         +--------+--------+-----+---------+--------+ Ulnar                 triphasic         +--------+--------+-----+---------+--------+  Left Doppler Findings: +--------+--------+-----+---------+--------+ Site    PressureIndexDoppler  Comments +--------+--------+-----+---------+--------+ [id number]          triphasic         +--------+--------+-----+---------+--------+ Radial               triphasic         +--------+--------+-----+---------+--------+ Ulnar                triphasic         +--------+--------+-----+---------+--------+  Summary: Right Carotid: Velocities in the right ICA are consistent with a 1-39% stenosis. Left Carotid: Velocities in the left ICA are consistent with a 1-39% stenosis. Vertebrals:  Bilateral vertebral arteries demonstrate antegrade flow. Subclavians: Normal flow hemodynamics were seen in bilateral subclavian              arteries. Right ABI: Resting right ankle-brachial index is within normal range. No evidence of significant right lower extremity arterial disease. The right toe-brachial index is normal. Left ABI: Resting left ankle-brachial index is within normal range. No evidence of significant left lower extremity arterial disease. The left toe-brachial index is abnormal. Right Upper Extremity: Doppler waveforms remain within normal limits with right radial compression. Doppler waveforms remain within normal limits with right ulnar compression. Left Upper Extremity: Doppler waveforms remain within normal limits with left radial compression. Doppler waveforms remain within normal limits with left ulnar compression.     Preliminary      Scheduled Meds: . amLODipine  10 mg Oral Daily  . aspirin EC  81 mg Oral Daily  . [START ON 12/04/2020] bisacodyl  5 mg Oral Once  . calcium carbonate  1 tablet Oral BID WC  . [START ON 12/05/2020] chlorhexidine  15 mL Mouth/Throat Once  . [START ON 12/05/2020] diazepam  2 mg Oral Once  . [START ON 12/05/2020] epinephrine  0-10 mcg/min Intravenous To OR  . feeding supplement (GLUCERNA  SHAKE)  237 mL Oral TID BM  . [START ON 12/05/2020] heparin-papaverine-plasmalyte irrigation   Irrigation To OR  . insulin aspart  0-15 Units Subcutaneous TID WC  . insulin aspart  0-5 Units Subcutaneous QHS  . insulin glargine  16 Units Subcutaneous Daily  . [START ON 12/05/2020] insulin   Intravenous To OR  . [START ON 12/05/2020] [place] Blood Cardioplegia vial (lidocaine/magnesium/mannitol 0.26g-4g-6.4g)   Intracoronary Once  . loratadine  10 mg Oral Daily  . metoprolol tartrate  12.5 mg Oral BID  . multivitamin with minerals  1 tablet Oral Daily  . [  START ON 12/05/2020] phenylephrine  30-200 mcg/min Intravenous To OR  . [START ON 12/05/2020] potassium chloride  80 mEq Other To OR  . rosuvastatin  40 mg Oral Daily  . sodium chloride flush  3 mL Intravenous Q12H  . sodium chloride flush  3 mL Intravenous Q12H  . [START ON 12/05/2020] tranexamic acid  15 mg/kg Intravenous To OR  . [START ON 12/05/2020] tranexamic acid  2 mg/kg Intracatheter To OR   Continuous Infusions: . sodium chloride    . [START ON 12/05/2020] cefUROXime (ZINACEF)  IV    . [START ON 12/05/2020] cefUROXime (ZINACEF)  IV    . [START ON 12/05/2020] dexmedetomidine    . [START ON 12/05/2020] heparin 30,000 units/NS 1000 mL solution for CELLSAVER    . heparin 600 Units/hr (12/03/20 0816)  . [START ON 12/05/2020] milrinone    . [START ON 12/05/2020] nitroGLYCERIN    . [START ON 12/05/2020] norepinephrine    . [START ON 12/05/2020] tranexamic acid (CYKLOKAPRON) infusion ([facility])    . [START ON 12/05/2020] vancomycin      Principal Problem:   NSTEMI (non-ST elevated myocardial infarction) ([place]) Active Problems:   Hyperlipidemia   Diabetes mellitus ([place])   Hypertension   Chest pain     Anari Evitt Derek Jack, [facility]  If 7PM-7AM, please contact night-coverage [email or web address] Password [facility] 12/03/2020, 3:56 PM    LOS: 3 days

## 2020-12-04 DIAGNOSIS — I214 Non-ST elevation (NSTEMI) myocardial infarction: Secondary | ICD-10-CM | POA: Diagnosis not present

## 2020-12-04 LAB — GLUCOSE, CAPILLARY
Glucose-Capillary: 136 mg/dL — ABNORMAL HIGH (ref 70–99)
Glucose-Capillary: 216 mg/dL — ABNORMAL HIGH (ref 70–99)
Glucose-Capillary: 203 mg/dL — ABNORMAL HIGH (ref 70–99)
Glucose-Capillary: 111 mg/dL — ABNORMAL HIGH (ref 70–99)

## 2020-12-04 LAB — CBC
HCT: 26.4 % — ABNORMAL LOW (ref 36.0–46.0)
Hemoglobin: 8.3 g/dL — ABNORMAL LOW (ref 12.0–15.0)
MCH: 29.6 pg (ref 26.0–34.0)
MCHC: 31.4 g/dL (ref 30.0–36.0)
MCV: 94.3 fL (ref 80.0–100.0)
Platelets: 176 10*3/uL (ref 150–400)
RBC: 2.8 MIL/uL — ABNORMAL LOW (ref 3.87–5.11)
RDW: 13.2 % (ref 11.5–15.5)
WBC: 4.6 10*3/uL (ref 4.0–10.5)
nRBC: 0 % (ref 0.0–0.2)

## 2020-12-04 LAB — BASIC METABOLIC PANEL
Anion gap: 8 (ref 5–15)
BUN: 41 mg/dL — ABNORMAL HIGH (ref 8–23)
CO2: 22 mmol/L (ref 22–32)
Calcium: 9.4 mg/dL (ref 8.9–10.3)
Chloride: 106 mmol/L (ref 98–111)
Creatinine, Ser: 1.77 mg/dL — ABNORMAL HIGH (ref 0.44–1.00)
GFR, Estimated: 30 mL/min — ABNORMAL LOW (ref >60)
Glucose, Bld: 207 mg/dL — ABNORMAL HIGH (ref 70–99)
Potassium: 4.9 mmol/L (ref 3.5–5.1)
Sodium: 136 mmol/L (ref 135–145)

## 2020-12-04 LAB — MAGNESIUM: Magnesium: 1.9 mg/dL (ref 1.7–2.4)

## 2020-12-04 LAB — HEPARIN LEVEL (UNFRACTIONATED): Heparin Unfractionated: 0.43 IU/mL (ref 0.30–0.70)

## 2020-12-04 MED ORDER — CHLORHEXIDINE GLUCONATE CLOTH 2 % EX PADS: 6.0000 | MEDICATED_PAD | Freq: Once | CUTANEOUS | Status: DC

## 2020-12-04 MED ORDER — TEMAZEPAM 15 MG PO CAPS: 15.0000 mg | ORAL_CAPSULE | Freq: Once | ORAL | Status: DC | PRN

## 2020-12-04 MED ORDER — BISACODYL 5 MG PO TBEC
5.0000 mg | DELAYED_RELEASE_TABLET | Freq: Once | ORAL | Status: DC
  Filled 2020-12-04: qty 1

## 2020-12-04 MED ORDER — CHLORHEXIDINE GLUCONATE 0.12 % MT SOLN
15.0000 mL | Freq: Once | OROMUCOSAL | Status: AC
  Administered 2020-12-05: 15 mL via OROMUCOSAL
  Filled 2020-12-04: qty 15

## 2020-12-04 MED ORDER — METOPROLOL TARTRATE 12.5 MG HALF TABLET
12.5000 mg | ORAL_TABLET | Freq: Once | ORAL | Status: AC
  Administered 2020-12-05: 12.5 mg via ORAL
  Filled 2020-12-04: qty 1

## 2020-12-04 NOTE — Progress Notes (Signed)
   12/04/20 1026  Clinical Encounter Type  Visited With Patient;Health care provider  Visit Type Initial;Spiritual support;Pre-op  Referral From Patient  Stress Factors  Patient Stress Factors Health changes   Chaplain responded to a request from the patient to visit and offer prayer before her surgery tomorrow. Patient is feeling a bit anxious about what's ahead of her with surgery, recovery, etc. Chaplain offered prayer and support. Chaplain introduced spiritual care services. Spiritual care services available as needed.   Jeri Lager, Chaplain

## 2020-12-04 NOTE — Progress Notes (Signed)
[place] for Heparin Indication: chest pain/ACS  No Known Allergies  Patient Measurements: Height: 4\' 11"  (149.9 cm) Weight: 42.6 kg (94 lb) IBW/kg (Calculated) : 43.2 Heparin Dosing Weight: 39.9 kg  Vital Signs: Temp: 99 F (37.2 C) (02/13 0637) Temp Source: Oral (02/13 2130) BP: 115/70 (02/13 8657)  Labs: Recent Labs    12/02/20 0039 12/03/20 0338 12/03/20 0816 12/04/20 0241  HGB 8.9* 8.7*  --  8.3*  HCT 27.9* 25.6*  --  26.4*  PLT 197 180  --  176  APTT  --  84*  --   --   LABPROT  --  13.8  --   --   INR  --  1.1  --   --   HEPARINUNFRC 0.30 0.30  --  0.43  CREATININE 1.59*  --  1.74* 1.77*    Estimated Creatinine Clearance: 19 mL/min (A) (by C-G formula based on SCr of 1.77 mg/dL (H)).   Medical History: Past Medical History:  Diagnosis Date  . Chest pain 11/30/2020  . Diabetes mellitus without complication ([place])   . Glaucoma   . History of chicken pox   . Hypertension     Assessment: 75 yr old female presents with chest pain and elevated troponin. The pt was was transitioned from enoxaparin to heparin post cath and is now planned for CABG on 2/14. PTA the patient is not on anticoagulation. Pharmacy is consulted to dose heparin.   Heparin level this morning is therapeutic at 0.43 while running at 600 units/hr. CBC is stable, Hgb 8.3, platelets 176. Per the RN, there have been no issues with the infusion and the patient is without signs or symptoms of bleeding.   Goal of Therapy:  Heparin level 0.3-0.7 units/ml Monitor platelets by anticoagulation protocol: Yes   Plan:  -Continue heparin IV at 600 units/hr -Monitor daily heparin level and CBC -Monitor for signs and symptoms of bleeding  Shauna Hugh, PharmD, [place]  PGY-1 Pharmacy Resident 12/04/2020 8:05 AM  Please check [email or web address] for unit-specific pharmacy phone numbers.

## 2020-12-04 NOTE — Progress Notes (Signed)
PROGRESS NOTE    Mariah Peterson  QPR:[id number]  DOB: 21-Jul-1946  DOA: 11/29/2020 PCP: Marrian Salvage, FNP Outpatient Specialists:   Hospital course:  75 year old female with history of uncontrolled DM-2, HTN and HLD presenting with substernal chest pain and admitted for non-STEMI. Troponin 23>> 188> 3900. Cardiology consulted.  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  TTE reassuring.  Plan for CABG on 2/14.  Subjective:  Patient states she is a little anxious today, is worried about her surgery tomorrow.  Would like to speak with the chaplain if possible.  Notes she has been praying.  Denies any physical discomfort.  No chest pain or shortness of breath.  States she is trying to keep her spirits up.  Objective: Vitals:   12/03/20 1841 12/03/20 2142 12/04/20 0637 12/04/20 0816  BP: (!) 120/58 132/74 115/70 (!) 142/60  Pulse: 93   87  Resp:    18  Temp: 98.1 F (36.7 C) 99.1 F (37.3 C) 99 F (37.2 C)   TempSrc: Oral Oral Oral   SpO2:    100%  Weight:   42.6 kg   Height:        Intake/Output Summary (Last 24 hours) at 12/04/2020 1619 Last data filed at 12/04/2020 9935 Gross per 24 hour  Intake 861.99 ml  Output -  Net 861.99 ml   Filed Weights   12/02/20 0456 12/03/20 0446 12/04/20 0637  Weight: 40.9 kg 42.3 kg 42.6 kg     Exam:  General: Well-appearing female sitting up in chair in no acute distress.  Seems a little anxious. Eyes: sclera anicteric, conjuctiva mild injection bilaterally CVS: S1-S2, regular  Respiratory:  decreased air entry bilaterally secondary to decreased inspiratory effort, rales at bases  GI: NABS, soft, NT  LE: No edema.  Neuro: A/O x 3, Moving all extremities equally with normal strength, CN 3-12 intact, grossly nonfocal.  Psych: patient is logical and coherent, judgement and insight appear normal, mood and affect appropriate to  situation.   Assessment & Plan:   Acute coronary syndrome Troponin peaked at 3900. Underwent left heart cath which showed calcified and tortuous vessels making PCI difficult. Plan is for CABG on Monday, 12/05/2020 Echocardiogram on 11/30/2020 revealed normal LVEF 60% with no SWMA Continue heparin drip, aspirin, metoprolol and rosuvastatin  DM2 Patient with poorly controlled diabetes at home with hemoglobin A1c 11.8 Blood sugars remain somewhat improved with increased dose of glargine to 18 units  Diabetes coordinator consultation pending. Will not change management right now prior to CABG tomorrow when she will have decreased p.o. intake for couple days.  HTN Continue metoprolol and amlodipine  CKD 3B Stable at 1.7    DVT prophylaxis: On IV heparin Code Status: Full Family Communication: None today Disposition Plan:   Patient is from: Home  Anticipated Discharge Location: Home  Barriers to Discharge: Awaiting CABG tomorrow  Is patient medically stable for Discharge: No   Consultants:  Cardiology  Procedures: 11/30/2020-LHC with calcific and ectatic coronary disease with marked tortuosity making any PCI procedure very difficult.  Antimicrobials:  None   Data Reviewed:  Basic Metabolic Panel: Recent Labs  Lab 11/30/20 0910 12/01/20 0124 12/02/20 0039 12/03/20 0816 12/04/20 0241  NA 137 137 138 138 136  K 3.6 3.9 4.3 4.5 4.9  CL 104 110 109 107 106  CO2 22 21* 23 21* 22  GLUCOSE 124* 98 102* 226* 207*  BUN 35* 30*  36* 39* 41*  CREATININE 1.58* 1.44* 1.59* 1.74* 1.77*  CALCIUM 9.6 9.5 9.9 10.0 9.4  MG  --  2.0 1.9 1.9 1.9  PHOS  --  4.5 4.0  --   --    Liver Function Tests: Recent Labs  Lab 11/29/20 1711 12/01/20 0124 12/02/20 0039  AST 21  --   --   ALT 18  --   --   ALKPHOS 44  --   --   BILITOT 0.6  --   --   PROT 6.6  --   --   ALBUMIN 3.5 2.6* 2.7*   Recent Labs  Lab 11/29/20 1711  LIPASE 111*   No results for input(s): AMMONIA in the  last 168 hours. CBC: Recent Labs  Lab 11/30/20 0514 12/01/20 0124 12/02/20 0039 12/03/20 0338 12/04/20 0241  WBC 4.8 5.1 4.8 4.1 4.6  HGB 9.9* 9.4* 8.9* 8.7* 8.3*  HCT 31.8* 29.3* 27.9* 25.6* 26.4*  MCV 95.2 92.4 92.7 90.8 94.3  PLT 214 201 197 180 176   Cardiac Enzymes: No results for input(s): CKTOTAL, CKMB, CKMBINDEX, TROPONINI in the last 168 hours. BNP (last 3 results) No results for input(s): PROBNP in the last 8760 hours. CBG: Recent Labs  Lab 12/03/20 1216 12/03/20 1745 12/03/20 2122 12/04/20 0725 12/04/20 1155  GLUCAP 351* 92 282* 136* 216*    Recent Results (from the past 240 hour(s))  Resp Panel by RT-PCR (Flu A&B, Covid) Nasopharyngeal Swab     Status: None   Collection Time: 11/29/20  5:13 PM   Specimen: Nasopharyngeal Swab; Nasopharyngeal(NP) swabs in vial transport medium  Result Value Ref Range Status   SARS Coronavirus 2 by RT PCR NEGATIVE NEGATIVE Final    Comment: (NOTE) SARS-CoV-2 target nucleic acids are NOT DETECTED.  The SARS-CoV-2 RNA is generally detectable in upper respiratory specimens during the acute phase of infection. The lowest concentration of SARS-CoV-2 viral copies this assay can detect is 138 copies/mL. A negative result does not preclude SARS-Cov-2 infection and should not be used as the sole basis for treatment or other patient management decisions. A negative result may occur with  improper specimen collection/handling, submission of specimen other than nasopharyngeal swab, presence of viral mutation(s) within the areas targeted by this assay, and inadequate number of viral copies(<138 copies/mL). A negative result must be combined with clinical observations, patient history, and epidemiological information. The expected result is Negative.  Fact Sheet for Patients:  EntrepreneurPulse.com.au  Fact Sheet for Healthcare Providers:  IncredibleEmployment.be  This test is no t yet approved  or cleared by the [place] FDA and  has been authorized for detection and/or diagnosis of SARS-CoV-2 by FDA under an Emergency Use Authorization (EUA). This EUA will remain  in effect (meaning this test can be used) for the duration of the COVID-19 declaration under Section 564(b)(1) of the Act, 21 U.S.C.section 360bbb-3(b)(1), unless the authorization is terminated  or revoked sooner.       Influenza A by PCR NEGATIVE NEGATIVE Final   Influenza B by PCR NEGATIVE NEGATIVE Final    Comment: (NOTE) The Xpert Xpress SARS-CoV-2/FLU/RSV plus assay is intended as an aid in the diagnosis of influenza from Nasopharyngeal swab specimens and should not be used as a sole basis for treatment. Nasal washings and aspirates are unacceptable for Xpert Xpress SARS-CoV-2/FLU/RSV testing.  Fact Sheet for Patients: EntrepreneurPulse.com.au  Fact Sheet for Healthcare Providers: IncredibleEmployment.be  This test is not yet approved or cleared by the [place] FDA and has  been authorized for detection and/or diagnosis of SARS-CoV-2 by FDA under an Emergency Use Authorization (EUA). This EUA will remain in effect (meaning this test can be used) for the duration of the COVID-19 declaration under Section 564(b)(1) of the Act, 21 U.S.C. section 360bbb-3(b)(1), unless the authorization is terminated or revoked.  Performed at [facility], [place] [address], [place], [place] [address]   Surgical pcr screen     Status: None   Collection Time: 12/02/20  1:20 PM   Specimen: Nasal Mucosa; Nasal Swab  Result Value Ref Range Status   MRSA, PCR NEGATIVE NEGATIVE Final   Staphylococcus aureus NEGATIVE NEGATIVE Final    Comment: (NOTE) The Xpert SA Assay (FDA approved for NASAL specimens in patients 50 years of age and older), is one component of a comprehensive surveillance program. It is not intended to diagnose infection nor to guide or monitor  treatment. Performed at [facility], [place] [address], [place], [place] [address]       Studies: DG Chest 2 View  Result Date: 12/03/2020 CLINICAL DATA:  75 year old female preoperative chest. EXAM: CHEST - 2 VIEW COMPARISON:  Chest CT without contrast 11/29/2020 and earlier. FINDINGS: Lung volumes and mediastinal contours remain normal. Visualized tracheal air column is within normal limits. Both lungs appear clear. No pneumothorax or pleural effusion. Nonobstructed visible bowel gas pattern with somewhat abundant retained stool. Abdomen Calcified aortic atherosclerosis. No acute osseous abnormality identified. IMPRESSION: No acute cardiopulmonary abnormality. Aortic atherosclerosis. Electronically Signed   By: Genevie Ann M.D.   On: 12/03/2020 07:55     Scheduled Meds: . amLODipine  10 mg Oral Daily  . aspirin EC  81 mg Oral Daily  . calcium carbonate  1 tablet Oral BID WC  . [START ON 12/05/2020] chlorhexidine  15 mL Mouth/Throat Once  . [START ON 12/05/2020] diazepam  2 mg Oral Once  . [START ON 12/05/2020] epinephrine  0-10 mcg/min Intravenous To OR  . feeding supplement (GLUCERNA SHAKE)  237 mL Oral TID BM  . [START ON 12/05/2020] heparin-papaverine-plasmalyte irrigation   Irrigation To OR  . insulin aspart  0-15 Units Subcutaneous TID WC  . insulin aspart  0-5 Units Subcutaneous QHS  . insulin glargine  18 Units Subcutaneous Daily  . [START ON 12/05/2020] insulin   Intravenous To OR  . [START ON 12/05/2020] [place] Blood Cardioplegia vial (lidocaine/magnesium/mannitol 0.26g-4g-6.4g)   Intracoronary Once  . loratadine  10 mg Oral Daily  . metoprolol tartrate  12.5 mg Oral BID  . multivitamin with minerals  1 tablet Oral Daily  . [START ON 12/05/2020] phenylephrine  30-200 mcg/min Intravenous To OR  . [START ON 12/05/2020] potassium chloride  80 mEq Other To OR  . rosuvastatin  40 mg Oral Daily  . sodium chloride flush  3 mL Intravenous Q12H  . sodium chloride flush  3 mL  Intravenous Q12H  . [START ON 12/05/2020] tranexamic acid  15 mg/kg Intravenous To OR  . [START ON 12/05/2020] tranexamic acid  2 mg/kg Intracatheter To OR   Continuous Infusions: . sodium chloride    . [START ON 12/05/2020] cefUROXime (ZINACEF)  IV    . [START ON 12/05/2020] cefUROXime (ZINACEF)  IV    . [START ON 12/05/2020] dexmedetomidine    . [START ON 12/05/2020] heparin 30,000 units/NS 1000 mL solution for CELLSAVER    . heparin 600 Units/hr (12/04/20 0826)  . [START ON 12/05/2020] milrinone    . [START ON 12/05/2020] nitroGLYCERIN    . [  START ON 12/05/2020] norepinephrine    . [START ON 12/05/2020] tranexamic acid (CYKLOKAPRON) infusion ([facility])    . [START ON 12/05/2020] vancomycin      Principal Problem:   NSTEMI (non-ST elevated myocardial infarction) ([place]) Active Problems:   Hyperlipidemia   Diabetes mellitus ([place])   Hypertension   Chest pain     Jaimen Melone Derek Jack, [facility]  If 7PM-7AM, please contact night-coverage [email or web address] Password [email or web address] 12/04/2020, 4:19 PM    LOS: 4 days

## 2020-12-04 NOTE — Progress Notes (Signed)
Progress Note  Patient Name: Mariah Peterson Date of Encounter: 12/04/2020  [facility] Cardiologist: Candee Furbish, MD   Subjective   No events overnight. She feels well today. Ambulated the halls yesterday without chest pain.  Inpatient Medications    Scheduled Meds: . amLODipine  10 mg Oral Daily  . aspirin EC  81 mg Oral Daily  . bisacodyl  5 mg Oral Once  . calcium carbonate  1 tablet Oral BID WC  . [START ON 12/05/2020] chlorhexidine  15 mL Mouth/Throat Once  . [START ON 12/05/2020] diazepam  2 mg Oral Once  . [START ON 12/05/2020] epinephrine  0-10 mcg/min Intravenous To OR  . feeding supplement (GLUCERNA SHAKE)  237 mL Oral TID BM  . [START ON 12/05/2020] heparin-papaverine-plasmalyte irrigation   Irrigation To OR  . insulin aspart  0-15 Units Subcutaneous TID WC  . insulin aspart  0-5 Units Subcutaneous QHS  . insulin glargine  18 Units Subcutaneous Daily  . [START ON 12/05/2020] insulin   Intravenous To OR  . [START ON 12/05/2020] [place] Blood Cardioplegia vial (lidocaine/magnesium/mannitol 0.26g-4g-6.4g)   Intracoronary Once  . loratadine  10 mg Oral Daily  . metoprolol tartrate  12.5 mg Oral BID  . multivitamin with minerals  1 tablet Oral Daily  . [START ON 12/05/2020] phenylephrine  30-200 mcg/min Intravenous To OR  . [START ON 12/05/2020] potassium chloride  80 mEq Other To OR  . rosuvastatin  40 mg Oral Daily  . sodium chloride flush  3 mL Intravenous Q12H  . sodium chloride flush  3 mL Intravenous Q12H  . [START ON 12/05/2020] tranexamic acid  15 mg/kg Intravenous To OR  . [START ON 12/05/2020] tranexamic acid  2 mg/kg Intracatheter To OR   Continuous Infusions: . sodium chloride    . [START ON 12/05/2020] cefUROXime (ZINACEF)  IV    . [START ON 12/05/2020] cefUROXime (ZINACEF)  IV    . [START ON 12/05/2020] dexmedetomidine    . [START ON 12/05/2020] heparin 30,000 units/NS 1000 mL solution for CELLSAVER    . heparin 600 Units/hr (12/03/20 0816)  . [START ON  12/05/2020] milrinone    . [START ON 12/05/2020] nitroGLYCERIN    . [START ON 12/05/2020] norepinephrine    . [START ON 12/05/2020] tranexamic acid (CYKLOKAPRON) infusion ([facility])    . [START ON 12/05/2020] vancomycin     PRN Meds: sodium chloride, acetaminophen, benzonatate, morphine injection, nitroGLYCERIN, ondansetron (ZOFRAN) IV, oxyCODONE, sodium chloride flush, temazepam   Vital Signs    Vitals:   12/03/20 0831 12/03/20 1841 12/03/20 2142 12/04/20 0637  BP: 134/60 (!) 120/58 132/74 115/70  Pulse: 80 93    Resp:      Temp:  98.1 F (36.7 C) 99.1 F (37.3 C) 99 F (37.2 C)  TempSrc:  Oral Oral Oral  SpO2:      Weight:    42.6 kg  Height:        Intake/Output Summary (Last 24 hours) at 12/04/2020 0801 Last data filed at 12/03/2020 2145 Gross per 24 hour  Intake 700 ml  Output --  Net 700 ml   Last 3 Weights 12/04/2020 12/03/2020 12/02/2020  Weight (lbs) 94 lb 93 lb 3.2 oz 90 lb 3.2 oz  Weight (kg) 42.638 kg 42.275 kg 40.914 kg      Telemetry    NSR - Personally Reviewed  ECG    No new tracing - Personally Reviewed  Physical Exam   GEN: No acute distress.   Neck: No  JVD Cardiac: RRR, no murmurs, rubs, or gallops.  Respiratory: Clear to auscultation bilaterally. GI: Soft, nontender, non-distended  MS: No edema; No deformity. Neuro:  Nonfocal  Psych: Normal affect   Labs    High Sensitivity Troponin:   Recent Labs  Lab 11/29/20 1638 11/29/20 1923 11/30/20 0514 11/30/20 0910  TROPONINIHS 23* 188* 3,858* 5,267*      Chemistry Recent Labs  Lab 11/29/20 1711 11/30/20 0910 12/01/20 0124 12/02/20 0039 12/03/20 0816 12/04/20 0241  NA  --    < > 137 138 138 136  K  --    < > 3.9 4.3 4.5 4.9  CL  --    < > 110 109 107 106  CO2  --    < > 21* 23 21* 22  GLUCOSE  --    < > 98 102* 226* 207*  BUN  --    < > 30* 36* 39* 41*  CREATININE  --    < > 1.44* 1.59* 1.74* 1.77*  CALCIUM  --    < > 9.5 9.9 10.0 9.4  PROT 6.6  --   --   --   --   --   ALBUMIN 3.5   --  2.6* 2.7*  --   --   AST 21  --   --   --   --   --   ALT 18  --   --   --   --   --   ALKPHOS 44  --   --   --   --   --   BILITOT 0.6  --   --   --   --   --   GFRNONAA  --    < > 38* 34* 31* 30*  ANIONGAP  --    < > 6 6 10 8    < > = values in this interval not displayed.     Hematology Recent Labs  Lab 12/02/20 0039 12/03/20 0338 12/04/20 0241  WBC 4.8 4.1 4.6  RBC 3.01* 2.82* 2.80*  HGB 8.9* 8.7* 8.3*  HCT 27.9* 25.6* 26.4*  MCV 92.7 90.8 94.3  MCH 29.6 30.9 29.6  MCHC 31.9 34.0 31.4  RDW 13.4 13.4 13.2  PLT 197 180 176    BNPNo results for input(s): BNP, PROBNP in the last 168 hours.   DDimer  Recent Labs  Lab 11/29/20 1923  DDIMER 1.48*     Radiology    DG Chest 2 View  Result Date: 12/03/2020 CLINICAL DATA:  74 year old female preoperative chest. EXAM: CHEST - 2 VIEW COMPARISON:  Chest CT without contrast 11/29/2020 and earlier. FINDINGS: Lung volumes and mediastinal contours remain normal. Visualized tracheal air column is within normal limits. Both lungs appear clear. No pneumothorax or pleural effusion. Nonobstructed visible bowel gas pattern with somewhat abundant retained stool. Abdomen Calcified aortic atherosclerosis. No acute osseous abnormality identified. IMPRESSION: No acute cardiopulmonary abnormality. Aortic atherosclerosis. Electronically Signed   By: Genevie Ann M.D.   On: 12/03/2020 07:55   VAS US DOPPLER PRE CABG  Result Date: 12/03/2020 PREOPERATIVE VASCULAR EVALUATION  Indications:      Pre-CABG. Risk Factors:     Hypertension, hyperlipidemia, Diabetes, coronary artery                   disease. Comparison Study: No prior studies. Performing Technologist: Darlin Coco RDMS  Examination Guidelines: A complete evaluation includes B-mode imaging, spectral Doppler, color Doppler, and power Doppler as needed of all accessible  portions of each vessel. Bilateral testing is considered an integral part of a complete examination. Limited examinations for  reoccurring indications may be performed as noted.  Right Carotid Findings: +----------+--------+--------+--------+------------+------------------+           PSV cm/sEDV cm/sStenosisDescribe    Comments           +----------+--------+--------+--------+------------+------------------+ CCA Prox  107     16                          intimal thickening +----------+--------+--------+--------+------------+------------------+ CCA Distal93      19              heterogenous                   +----------+--------+--------+--------+------------+------------------+ ICA Prox  83      19      1-39%   heterogenous                   +----------+--------+--------+--------+------------+------------------+ ICA Distal70      18                          tortuous           +----------+--------+--------+--------+------------+------------------+ ECA       102     12                                             +----------+--------+--------+--------+------------+------------------+ Portions of this table do not appear on this page. +----------+--------+-------+----------------+------------+           PSV cm/sEDV cmsDescribe        Arm Pressure +----------+--------+-------+----------------+------------+ Subclavian226            Multiphasic, WNL             +----------+--------+-------+----------------+------------+ +---------+--------+--+--------+--+---------+ VertebralPSV cm/s88EDV cm/s26Antegrade +---------+--------+--+--------+--+---------+ Left Carotid Findings: +----------+--------+--------+--------+------------+------------------+           PSV cm/sEDV cm/sStenosisDescribe    Comments           +----------+--------+--------+--------+------------+------------------+ CCA Prox  138     18                          intimal thickening +----------+--------+--------+--------+------------+------------------+ CCA Distal92      19              heterogenous                    +----------+--------+--------+--------+------------+------------------+ ICA Prox  86      18      1-39%   heterogenous                   +----------+--------+--------+--------+------------+------------------+ ICA Distal93      25                          tortuous           +----------+--------+--------+--------+------------+------------------+ ECA       168                                                    +----------+--------+--------+--------+------------+------------------+ +----------+--------+--------+----------------+------------+ SubclavianPSV  cm/sEDV cm/sDescribe        Arm Pressure +----------+--------+--------+----------------+------------+           268             Multiphasic, WNL             +----------+--------+--------+----------------+------------+ +---------+--------+--+--------+--+---------+ VertebralPSV cm/s80EDV cm/s21Antegrade +---------+--------+--+--------+--+---------+  ABI Findings: +---------+------------------+-----+---------+--------+ Right    Rt Pressure (mmHg)IndexWaveform Comment  +---------+------------------+-----+---------+--------+ Brachial 126                    triphasic         +---------+------------------+-----+---------+--------+ PTA      137               1.09 triphasic         +---------+------------------+-----+---------+--------+ DP       138               1.10 triphasic         +---------+------------------+-----+---------+--------+ Great Toe90                0.71 Normal            +---------+------------------+-----+---------+--------+ +---------+------------------+-----+---------+-------+ Left     Lt Pressure (mmHg)IndexWaveform Comment +---------+------------------+-----+---------+-------+ Brachial 123                    triphasic        +---------+------------------+-----+---------+-------+ PTA      132               1.05 triphasic         +---------+------------------+-----+---------+-------+ DP       125               0.99 triphasic        +---------+------------------+-----+---------+-------+ Great Toe70                0.56 Abnormal         +---------+------------------+-----+---------+-------+ +-------+---------------+----------------+ ABI/TBIToday's ABI/TBIPrevious ABI/TBI +-------+---------------+----------------+ Right  1.10/0.71                       +-------+---------------+----------------+ Left   1.05/0.56                       +-------+---------------+----------------+  Right Doppler Findings: +--------+--------+-----+---------+--------+ Site    PressureIndexDoppler  Comments +--------+--------+-----+---------+--------+ [id number]          triphasic         +--------+--------+-----+---------+--------+ Radial               triphasic         +--------+--------+-----+---------+--------+ Ulnar                triphasic         +--------+--------+-----+---------+--------+  Left Doppler Findings: +--------+--------+-----+---------+--------+ Site    PressureIndexDoppler  Comments +--------+--------+-----+---------+--------+ [id number]          triphasic         +--------+--------+-----+---------+--------+ Radial               triphasic         +--------+--------+-----+---------+--------+ Ulnar                triphasic         +--------+--------+-----+---------+--------+  Summary: Right Carotid: Velocities in the right ICA are consistent with a 1-39% stenosis. Left Carotid: Velocities in the left ICA are consistent with a 1-39% stenosis. Vertebrals:  Bilateral vertebral arteries demonstrate antegrade flow. Subclavians: Normal flow hemodynamics were seen in bilateral subclavian  arteries. Right ABI: Resting right ankle-brachial index is within normal range. No evidence of significant right lower extremity arterial disease. The right toe-brachial index is normal.  Left ABI: Resting left ankle-brachial index is within normal range. No evidence of significant left lower extremity arterial disease. The left toe-brachial index is abnormal. Right Upper Extremity: Doppler waveforms remain within normal limits with right radial compression. Doppler waveforms remain within normal limits with right ulnar compression. Left Upper Extremity: Doppler waveforms remain within normal limits with left radial compression. Doppler waveforms remain within normal limits with left ulnar compression.  Electronically signed by Harold Barban MD on 12/03/2020 at 8:00:06 PM.    Final     Cardiac Studies   Echocardiogram 11/30/2020: Impressions: 1. Left ventricular ejection fraction, by estimation, is 55 to 60%. The  left ventricle has normal function. The left ventricle has no regional  wall motion abnormalities. Left ventricular diastolic parameters were  normal.  2. Right ventricular systolic function is normal. The right ventricular  size is normal.  3. The mitral valve is normal in structure. Trivial mitral valve  regurgitation. No evidence of mitral stenosis.  4. The aortic valve is normal in structure. Aortic valve regurgitation is  not visualized. No aortic stenosis is present.  5. The inferior vena cava is normal in size with greater than 50%  respiratory variability, suggesting right atrial pressure of 3 mmHg. _______________  Cardiac Catheterization 11/30/2020:  Widely patent left main  Heavily calcified left main, and LAD. LAD contains significant tortuosity. In the distal third there is 50% narrowing followed by an area of ectasia and then 70% narrowing near the apex. Beyond this 3 moderate-sized diagonals are arise.  Circumflex gives origin to 3 obtuse marginals. The first is moderate in size and contains a region of angulation in which there is a 90% stenosis.  A large ramus intermedius is tortuous and contains proximal 95% stenosis. The stenosis is  within an acute bend.  Right coronary is diffusely diseased proximally with 90% stenosis. The vessel is small in diameter. PDA is totally occluded and fills by right to right and left-to-right collaterals.  Normal LV function.  Numerous left ventricular AV fistula a cause left ventricular filling during left coronary injection.  Recommendations:  Calcific and ectatic coronary disease with marked tortuosity making any PCI procedure very difficult. The LAD does not contain significant proximal disease. There is moderate distal disease. Would recommend consideration of coronary bypass grafting for ramus, circumflex, PDA, and distal LAD. If this is not possible, perhaps he ramus intermedius could be stented although will be difficult due to tortuosity and calcification.  Diagnostic Dominance: Right      Patient Profile     75 y.o. 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  Assessment & Plan    #NSTEMI: #Multivessel CAD Patient presented with chest pain found to have  troponin elevated at 23 >>188 >>3,858 >>5,267. TTE showed LVEF of 55-60% with normal wall motion and diastolic function. No significant valvular disease.  LHC showed severe multivessel CAD including heavily calcified left main and LAD. Now awaiting CABG - CT surgery was consulted and plan is for CABG. Currently on the board for 12/05/2020.  -Continue IV Heparin drip -Continue aspirin 81mg  daily -No P2Y12 inhibitor due to plan for surgery -Continue metop 12.5mg  BID--change to long-acting prior to discharge -Continue crestor 40mg   daily -Nitro prn -No ACE/ARB due to renal function -Plan for Cardiac rehab  #Hypertension  Controlled. - Continue Amlodipine 10mg  daily and Lopressor 12.5mg  twice daily  #Hyperlipidemia Lipid panel this  admission: Total Cholesterol 302, Triglycerides 108, HDL 54, LDL 226.  LDL goal <70 given CAD. - Continue Crestor 40mg --patient was not taking prior to admission - Will need repeat lipid panel/LFTs in 6-8 weeks.  #Type 2 Diabetes Mellitus - Hemoglobin A1c 11.8 this admission. - Management per primary team.  #CKD Stage III Baseline 1.2 to 1.6. - Avoid nephrotoxic agents - Continue to monitor  #Anemia - Hemoglobin 8.3 today. Down from 11.2 on admission. Baseline in the 10 range.  - No signs of active bleeding - Management per primary team.  #Cough Was recently prescribed Azithromycin by her PCP but this was stopped on admission.  - On mucinex per primary team     For questions or updates, please contact [facility] Please consult [email or web address] for contact info under        Signed, Freada Bergeron, MD  12/04/2020, 8:01 AM

## 2020-12-05 ENCOUNTER — Inpatient Hospital Stay (HOSPITAL_COMMUNITY)
Admission: EM | Disposition: A | Discharge status: Home / Self Care | Payer: Self-pay | Source: Home / Self Care | Attending: Thoracic Surgery (Cardiothoracic Vascular Surgery)

## 2020-12-05 ENCOUNTER — Inpatient Hospital Stay (HOSPITAL_COMMUNITY): Payer: Medicare Other

## 2020-12-05 ENCOUNTER — Encounter (HOSPITAL_COMMUNITY): Payer: Self-pay | Admitting: Student

## 2020-12-05 DIAGNOSIS — I25110 Atherosclerotic heart disease of native coronary artery with unstable angina pectoris: Secondary | ICD-10-CM

## 2020-12-05 DIAGNOSIS — Z951 Presence of aortocoronary bypass graft: Secondary | ICD-10-CM

## 2020-12-05 HISTORY — PX: TEE WITHOUT CARDIOVERSION: SHX5443

## 2020-12-05 HISTORY — PX: CORONARY ARTERY BYPASS GRAFT: SHX141

## 2020-12-05 LAB — CBC
HCT: 28.3 % — ABNORMAL LOW (ref 36.0–46.0)
HCT: 36.4 % (ref 36.0–46.0)
HCT: 33.5 % — ABNORMAL LOW (ref 36.0–46.0)
Hemoglobin: 9.5 g/dL — ABNORMAL LOW (ref 12.0–15.0)
Hemoglobin: 12.6 g/dL (ref 12.0–15.0)
Hemoglobin: 11.7 g/dL — ABNORMAL LOW (ref 12.0–15.0)
MCH: 30.9 pg (ref 26.0–34.0)
MCH: 29.7 pg (ref 26.0–34.0)
MCH: 29.5 pg (ref 26.0–34.0)
MCHC: 33.6 g/dL (ref 30.0–36.0)
MCHC: 34.6 g/dL (ref 30.0–36.0)
MCHC: 34.9 g/dL (ref 30.0–36.0)
MCV: 92.2 fL (ref 80.0–100.0)
MCV: 85.8 fL (ref 80.0–100.0)
MCV: 84.4 fL (ref 80.0–100.0)
Platelets: 210 10*3/uL (ref 150–400)
Platelets: 176 10*3/uL (ref 150–400)
Platelets: 174 10*3/uL (ref 150–400)
RBC: 3.07 MIL/uL — ABNORMAL LOW (ref 3.87–5.11)
RBC: 4.24 MIL/uL (ref 3.87–5.11)
RBC: 3.97 MIL/uL (ref 3.87–5.11)
RDW: 13.6 % (ref 11.5–15.5)
RDW: 14.6 % (ref 11.5–15.5)
RDW: 15.4 % (ref 11.5–15.5)
WBC: 5.5 10*3/uL (ref 4.0–10.5)
WBC: 3.3 10*3/uL — ABNORMAL LOW (ref 4.0–10.5)
WBC: 5.7 10*3/uL (ref 4.0–10.5)
nRBC: 0 % (ref 0.0–0.2)
nRBC: 0 % (ref 0.0–0.2)
nRBC: 0 % (ref 0.0–0.2)

## 2020-12-05 LAB — GLUCOSE, CAPILLARY
Glucose-Capillary: 99 mg/dL (ref 70–99)
Glucose-Capillary: 127 mg/dL — ABNORMAL HIGH (ref 70–99)
Glucose-Capillary: 93 mg/dL (ref 70–99)
Glucose-Capillary: 141 mg/dL — ABNORMAL HIGH (ref 70–99)
Glucose-Capillary: 130 mg/dL — ABNORMAL HIGH (ref 70–99)
Glucose-Capillary: 139 mg/dL — ABNORMAL HIGH (ref 70–99)
Glucose-Capillary: 151 mg/dL — ABNORMAL HIGH (ref 70–99)
Glucose-Capillary: 172 mg/dL — ABNORMAL HIGH (ref 70–99)
Glucose-Capillary: 209 mg/dL — ABNORMAL HIGH (ref 70–99)
Glucose-Capillary: 178 mg/dL — ABNORMAL HIGH (ref 70–99)
Glucose-Capillary: 166 mg/dL — ABNORMAL HIGH (ref 70–99)

## 2020-12-05 LAB — POCT I-STAT, CHEM 8
BUN: 27 mg/dL — ABNORMAL HIGH (ref 8–23)
BUN: 36 mg/dL — ABNORMAL HIGH (ref 8–23)
BUN: 34 mg/dL — ABNORMAL HIGH (ref 8–23)
BUN: 28 mg/dL — ABNORMAL HIGH (ref 8–23)
BUN: 33 mg/dL — ABNORMAL HIGH (ref 8–23)
BUN: 34 mg/dL — ABNORMAL HIGH (ref 8–23)
Calcium, Ion: 1.13 mmol/L — ABNORMAL LOW (ref 1.15–1.40)
Calcium, Ion: 1.32 mmol/L (ref 1.15–1.40)
Calcium, Ion: 1.33 mmol/L (ref 1.15–1.40)
Calcium, Ion: 0.89 mmol/L — CL (ref 1.15–1.40)
Calcium, Ion: 0.85 mmol/L — CL (ref 1.15–1.40)
Calcium, Ion: 1.13 mmol/L — ABNORMAL LOW (ref 1.15–1.40)
Chloride: 106 mmol/L (ref 98–111)
Chloride: 114 mmol/L — ABNORMAL HIGH (ref 98–111)
Chloride: 107 mmol/L (ref 98–111)
Chloride: 101 mmol/L (ref 98–111)
Chloride: 102 mmol/L (ref 98–111)
Chloride: 106 mmol/L (ref 98–111)
Creatinine, Ser: 1 mg/dL (ref 0.44–1.00)
Creatinine, Ser: 1.4 mg/dL — ABNORMAL HIGH (ref 0.44–1.00)
Creatinine, Ser: 1.3 mg/dL — ABNORMAL HIGH (ref 0.44–1.00)
Creatinine, Ser: 1 mg/dL (ref 0.44–1.00)
Creatinine, Ser: 0.9 mg/dL (ref 0.44–1.00)
Creatinine, Ser: 1 mg/dL (ref 0.44–1.00)
Glucose, Bld: 113 mg/dL — ABNORMAL HIGH (ref 70–99)
Glucose, Bld: 86 mg/dL (ref 70–99)
Glucose, Bld: 114 mg/dL — ABNORMAL HIGH (ref 70–99)
Glucose, Bld: 135 mg/dL — ABNORMAL HIGH (ref 70–99)
Glucose, Bld: 142 mg/dL — ABNORMAL HIGH (ref 70–99)
Glucose, Bld: 151 mg/dL — ABNORMAL HIGH (ref 70–99)
HCT: 18 % — ABNORMAL LOW (ref 36.0–46.0)
HCT: 23 % — ABNORMAL LOW (ref 36.0–46.0)
HCT: 24 % — ABNORMAL LOW (ref 36.0–46.0)
HCT: 24 % — ABNORMAL LOW (ref 36.0–46.0)
HCT: 20 % — ABNORMAL LOW (ref 36.0–46.0)
HCT: 24 % — ABNORMAL LOW (ref 36.0–46.0)
Hemoglobin: 6.1 g/dL — CL (ref 12.0–15.0)
Hemoglobin: 7.8 g/dL — ABNORMAL LOW (ref 12.0–15.0)
Hemoglobin: 8.2 g/dL — ABNORMAL LOW (ref 12.0–15.0)
Hemoglobin: 8.2 g/dL — ABNORMAL LOW (ref 12.0–15.0)
Hemoglobin: 6.8 g/dL — CL (ref 12.0–15.0)
Hemoglobin: 8.2 g/dL — ABNORMAL LOW (ref 12.0–15.0)
Potassium: 3.1 mmol/L — ABNORMAL LOW (ref 3.5–5.1)
Potassium: 4.1 mmol/L (ref 3.5–5.1)
Potassium: 4.4 mmol/L (ref 3.5–5.1)
Potassium: 4.9 mmol/L (ref 3.5–5.1)
Potassium: 5 mmol/L (ref 3.5–5.1)
Potassium: 5.5 mmol/L — ABNORMAL HIGH (ref 3.5–5.1)
Sodium: 138 mmol/L (ref 135–145)
Sodium: 144 mmol/L (ref 135–145)
Sodium: 138 mmol/L (ref 135–145)
Sodium: 137 mmol/L (ref 135–145)
Sodium: 138 mmol/L (ref 135–145)
Sodium: 139 mmol/L (ref 135–145)
TCO2: 15 mmol/L — ABNORMAL LOW (ref 22–32)
TCO2: 22 mmol/L (ref 22–32)
TCO2: 21 mmol/L — ABNORMAL LOW (ref 22–32)
TCO2: 23 mmol/L (ref 22–32)
TCO2: 22 mmol/L (ref 22–32)
TCO2: 24 mmol/L (ref 22–32)

## 2020-12-05 LAB — POCT I-STAT EG7
Acid-base deficit: 5 mmol/L — ABNORMAL HIGH (ref 0.0–2.0)
Bicarbonate: 19.2 mmol/L — ABNORMAL LOW (ref 20.0–28.0)
Calcium, Ion: 0.84 mmol/L — CL (ref 1.15–1.40)
HCT: 24 % — ABNORMAL LOW (ref 36.0–46.0)
Hemoglobin: 8.2 g/dL — ABNORMAL LOW (ref 12.0–15.0)
O2 Saturation: 80 %
Potassium: 4.8 mmol/L (ref 3.5–5.1)
Sodium: 137 mmol/L (ref 135–145)
TCO2: 20 mmol/L — ABNORMAL LOW (ref 22–32)
pCO2, Ven: 32.2 mmHg — ABNORMAL LOW (ref 44.0–60.0)
pH, Ven: 7.384 (ref 7.250–7.430)
pO2, Ven: 45 mmHg (ref 32.0–45.0)

## 2020-12-05 LAB — POCT I-STAT 7, (LYTES, BLD GAS, ICA,H+H)
Acid-Base Excess: 0 mmol/L (ref 0.0–2.0)
Acid-base deficit: 3 mmol/L — ABNORMAL HIGH (ref 0.0–2.0)
Acid-base deficit: 1 mmol/L (ref 0.0–2.0)
Acid-base deficit: 4 mmol/L — ABNORMAL HIGH (ref 0.0–2.0)
Bicarbonate: 21.2 mmol/L (ref 20.0–28.0)
Bicarbonate: 23 mmol/L (ref 20.0–28.0)
Bicarbonate: 23 mmol/L (ref 20.0–28.0)
Bicarbonate: 21.6 mmol/L (ref 20.0–28.0)
Calcium, Ion: 0.81 mmol/L — CL (ref 1.15–1.40)
Calcium, Ion: 0.81 mmol/L — CL (ref 1.15–1.40)
Calcium, Ion: 0.99 mmol/L — ABNORMAL LOW (ref 1.15–1.40)
Calcium, Ion: 1.02 mmol/L — ABNORMAL LOW (ref 1.15–1.40)
HCT: 22 % — ABNORMAL LOW (ref 36.0–46.0)
HCT: 21 % — ABNORMAL LOW (ref 36.0–46.0)
HCT: 33 % — ABNORMAL LOW (ref 36.0–46.0)
HCT: 34 % — ABNORMAL LOW (ref 36.0–46.0)
Hemoglobin: 7.5 g/dL — ABNORMAL LOW (ref 12.0–15.0)
Hemoglobin: 7.1 g/dL — ABNORMAL LOW (ref 12.0–15.0)
Hemoglobin: 11.2 g/dL — ABNORMAL LOW (ref 12.0–15.0)
Hemoglobin: 11.6 g/dL — ABNORMAL LOW (ref 12.0–15.0)
O2 Saturation: 100 %
O2 Saturation: 100 %
O2 Saturation: 100 %
O2 Saturation: 93 %
Patient temperature: 36.5
Patient temperature: 36.4
Potassium: 4.2 mmol/L (ref 3.5–5.1)
Potassium: 5.5 mmol/L — ABNORMAL HIGH (ref 3.5–5.1)
Potassium: 5.1 mmol/L (ref 3.5–5.1)
Potassium: 4.7 mmol/L (ref 3.5–5.1)
Sodium: 140 mmol/L (ref 135–145)
Sodium: 138 mmol/L (ref 135–145)
Sodium: 141 mmol/L (ref 135–145)
Sodium: 143 mmol/L (ref 135–145)
TCO2: 22 mmol/L (ref 22–32)
TCO2: 24 mmol/L (ref 22–32)
TCO2: 24 mmol/L (ref 22–32)
TCO2: 23 mmol/L (ref 22–32)
pCO2 arterial: 31.9 mmHg — ABNORMAL LOW (ref 32.0–48.0)
pCO2 arterial: 30.6 mmHg — ABNORMAL LOW (ref 32.0–48.0)
pCO2 arterial: 33.7 mmHg (ref 32.0–48.0)
pCO2 arterial: 38.1 mmHg (ref 32.0–48.0)
pH, Arterial: 7.43 (ref 7.350–7.450)
pH, Arterial: 7.484 — ABNORMAL HIGH (ref 7.350–7.450)
pH, Arterial: 7.441 (ref 7.350–7.450)
pH, Arterial: 7.358 (ref 7.350–7.450)
pO2, Arterial: 377 mmHg — ABNORMAL HIGH (ref 83.0–108.0)
pO2, Arterial: 324 mmHg — ABNORMAL HIGH (ref 83.0–108.0)
pO2, Arterial: 274 mmHg — ABNORMAL HIGH (ref 83.0–108.0)
pO2, Arterial: 67 mmHg — ABNORMAL LOW (ref 83.0–108.0)

## 2020-12-05 LAB — BASIC METABOLIC PANEL
Anion gap: 9 (ref 5–15)
Anion gap: 11 (ref 5–15)
BUN: 44 mg/dL — ABNORMAL HIGH (ref 8–23)
BUN: 25 mg/dL — ABNORMAL HIGH (ref 8–23)
CO2: 23 mmol/L (ref 22–32)
CO2: 19 mmol/L — ABNORMAL LOW (ref 22–32)
Calcium: 9.7 mg/dL (ref 8.9–10.3)
Calcium: 7.6 mg/dL — ABNORMAL LOW (ref 8.9–10.3)
Chloride: 104 mmol/L (ref 98–111)
Chloride: 107 mmol/L (ref 98–111)
Creatinine, Ser: 1.65 mg/dL — ABNORMAL HIGH (ref 0.44–1.00)
Creatinine, Ser: 1.18 mg/dL — ABNORMAL HIGH (ref 0.44–1.00)
GFR, Estimated: 33 mL/min — ABNORMAL LOW (ref >60)
GFR, Estimated: 49 mL/min — ABNORMAL LOW (ref >60)
Glucose, Bld: 149 mg/dL — ABNORMAL HIGH (ref 70–99)
Glucose, Bld: 185 mg/dL — ABNORMAL HIGH (ref 70–99)
Potassium: 4.5 mmol/L (ref 3.5–5.1)
Potassium: 4.4 mmol/L (ref 3.5–5.1)
Sodium: 136 mmol/L (ref 135–145)
Sodium: 137 mmol/L (ref 135–145)

## 2020-12-05 LAB — HEMOGLOBIN AND HEMATOCRIT, BLOOD
HCT: 23.3 % — ABNORMAL LOW (ref 36.0–46.0)
Hemoglobin: 7.8 g/dL — ABNORMAL LOW (ref 12.0–15.0)

## 2020-12-05 LAB — PREPARE RBC (CROSSMATCH)

## 2020-12-05 LAB — PLATELET COUNT: Platelets: 70 10*3/uL — ABNORMAL LOW (ref 150–400)

## 2020-12-05 LAB — APTT: aPTT: 44 seconds — ABNORMAL HIGH (ref 24–36)

## 2020-12-05 LAB — MAGNESIUM
Magnesium: 2.1 mg/dL (ref 1.7–2.4)
Magnesium: 3.8 mg/dL — ABNORMAL HIGH (ref 1.7–2.4)

## 2020-12-05 LAB — PROTIME-INR
INR: 1.5 — ABNORMAL HIGH (ref 0.8–1.2)
Prothrombin Time: 17.5 seconds — ABNORMAL HIGH (ref 11.4–15.2)

## 2020-12-05 LAB — ECHO INTRAOPERATIVE TEE
Height: 59 in
S' Lateral: 1.6 cm
Weight: 1502.4 oz

## 2020-12-05 LAB — HEPARIN LEVEL (UNFRACTIONATED): Heparin Unfractionated: 0.61 IU/mL (ref 0.30–0.70)

## 2020-12-05 SURGERY — CORONARY ARTERY BYPASS GRAFTING (CABG)
Anesthesia: General | Site: Chest

## 2020-12-05 MED ORDER — PROTAMINE SULFATE 10 MG/ML IV SOLN
INTRAVENOUS | Status: AC
  Filled 2020-12-05: qty 25

## 2020-12-05 MED ORDER — EPHEDRINE SULFATE-NACL 50-0.9 MG/10ML-% IV SOSY
PREFILLED_SYRINGE | INTRAVENOUS | Status: DC | PRN
  Administered 2020-12-05: 5 mg via INTRAVENOUS

## 2020-12-05 MED ORDER — MORPHINE SULFATE (PF) 2 MG/ML IV SOLN
1.0000 mg | INTRAVENOUS | Status: DC | PRN
  Administered 2020-12-05: 4 mg via INTRAVENOUS
  Administered 2020-12-06 (×2): 2 mg via INTRAVENOUS
  Filled 2020-12-05 (×2): qty 2

## 2020-12-05 MED ORDER — PHENYLEPHRINE 40 MCG/ML (10ML) SYRINGE FOR IV PUSH (FOR BLOOD PRESSURE SUPPORT)
PREFILLED_SYRINGE | INTRAVENOUS | Status: AC
  Filled 2020-12-05: qty 10

## 2020-12-05 MED ORDER — CHLORHEXIDINE GLUCONATE CLOTH 2 % EX PADS
6.0000 | MEDICATED_PAD | Freq: Every day | CUTANEOUS | Status: DC
  Administered 2020-12-05 – 2020-12-07 (×3): 6 via TOPICAL

## 2020-12-05 MED ORDER — ACETAMINOPHEN 160 MG/5ML PO SOLN
1000.0000 mg | Freq: Four times a day (QID) | ORAL | Status: DC
  Administered 2020-12-06 (×3): 1000 mg
  Filled 2020-12-05 (×3): qty 40.6

## 2020-12-05 MED ORDER — SODIUM CHLORIDE 0.45 % IV SOLN: INTRAVENOUS | Status: DC | PRN

## 2020-12-05 MED ORDER — POTASSIUM CHLORIDE 10 MEQ/50ML IV SOLN: 10.0000 meq | INTRAVENOUS | Status: AC

## 2020-12-05 MED ORDER — SODIUM CHLORIDE 0.9 % IV SOLN: INTRAVENOUS | Status: DC

## 2020-12-05 MED ORDER — SODIUM CHLORIDE (PF) 0.9 % IJ SOLN
INTRAMUSCULAR | Status: AC
  Filled 2020-12-05: qty 10

## 2020-12-05 MED ORDER — ROSUVASTATIN CALCIUM 20 MG PO TABS
40.0000 mg | ORAL_TABLET | Freq: Every day | ORAL | Status: DC
  Administered 2020-12-06: 40 mg via ORAL
  Filled 2020-12-05: qty 2

## 2020-12-05 MED ORDER — PLASMA-LYTE 148 IV SOLN
INTRAVENOUS | Status: DC | PRN
  Administered 2020-12-05: 500 mL via INTRAVASCULAR

## 2020-12-05 MED ORDER — ONDANSETRON HCL 4 MG/2ML IJ SOLN: 4.0000 mg | Freq: Four times a day (QID) | INTRAMUSCULAR | Status: DC | PRN

## 2020-12-05 MED ORDER — ROCURONIUM BROMIDE 10 MG/ML (PF) SYRINGE
PREFILLED_SYRINGE | INTRAVENOUS | Status: AC
  Filled 2020-12-05: qty 10

## 2020-12-05 MED ORDER — CHLORHEXIDINE GLUCONATE 0.12% ORAL RINSE (MEDLINE KIT)
15.0000 mL | Freq: Two times a day (BID) | OROMUCOSAL | Status: DC
  Administered 2020-12-05 – 2020-12-08 (×4): 15 mL via OROMUCOSAL

## 2020-12-05 MED ORDER — LACTATED RINGERS IV SOLN: INTRAVENOUS | Status: DC | PRN

## 2020-12-05 MED ORDER — NICARDIPINE HCL IN NACL 20-0.86 MG/200ML-% IV SOLN
3.0000 mg/h | INTRAVENOUS | Status: DC
  Filled 2020-12-05: qty 200

## 2020-12-05 MED ORDER — TRAMADOL HCL 50 MG PO TABS: 50.0000 mg | ORAL_TABLET | ORAL | Status: DC | PRN

## 2020-12-05 MED ORDER — VANCOMYCIN HCL IN DEXTROSE 1-5 GM/200ML-% IV SOLN
1000.0000 mg | Freq: Once | INTRAVENOUS | Status: AC
  Administered 2020-12-05: 1000 mg via INTRAVENOUS
  Filled 2020-12-05: qty 200

## 2020-12-05 MED ORDER — SODIUM CHLORIDE 0.9% IV SOLUTION: Freq: Once | INTRAVENOUS | Status: DC

## 2020-12-05 MED ORDER — BISACODYL 5 MG PO TBEC
10.0000 mg | DELAYED_RELEASE_TABLET | Freq: Every day | ORAL | Status: DC
  Administered 2020-12-07 – 2020-12-09 (×2): 10 mg via ORAL
  Filled 2020-12-05 (×3): qty 2

## 2020-12-05 MED ORDER — ROCURONIUM BROMIDE 10 MG/ML (PF) SYRINGE
PREFILLED_SYRINGE | INTRAVENOUS | Status: DC | PRN
  Administered 2020-12-05: 50 mg via INTRAVENOUS
  Administered 2020-12-05: 20 mg via INTRAVENOUS
  Administered 2020-12-05: 25 mg via INTRAVENOUS
  Administered 2020-12-05: 75 mg via INTRAVENOUS
  Administered 2020-12-05: 30 mg via INTRAVENOUS

## 2020-12-05 MED ORDER — ORAL CARE MOUTH RINSE
15.0000 mL | OROMUCOSAL | Status: DC
  Administered 2020-12-05 – 2020-12-06 (×12): 15 mL via OROMUCOSAL

## 2020-12-05 MED ORDER — OXYCODONE HCL 5 MG PO TABS: 5.0000 mg | ORAL_TABLET | ORAL | Status: DC | PRN

## 2020-12-05 MED ORDER — SODIUM CHLORIDE 0.9 % IV SOLN
1.5000 g | Freq: Two times a day (BID) | INTRAVENOUS | Status: AC
  Administered 2020-12-05 – 2020-12-07 (×4): 1.5 g via INTRAVENOUS
  Filled 2020-12-05 (×4): qty 1.5

## 2020-12-05 MED ORDER — CHLORHEXIDINE GLUCONATE 0.12 % MT SOLN
15.0000 mL | OROMUCOSAL | Status: AC
  Administered 2020-12-05: 15 mL via OROMUCOSAL

## 2020-12-05 MED ORDER — FENTANYL CITRATE (PF) 250 MCG/5ML IJ SOLN
INTRAMUSCULAR | Status: AC
  Filled 2020-12-05: qty 25

## 2020-12-05 MED ORDER — LACTATED RINGERS IV SOLN: INTRAVENOUS | Status: DC

## 2020-12-05 MED ORDER — ALBUMIN HUMAN 5 % IV SOLN
250.0000 mL | INTRAVENOUS | Status: AC | PRN
  Administered 2020-12-05 (×2): 12.5 g via INTRAVENOUS
  Filled 2020-12-05: qty 250

## 2020-12-05 MED ORDER — MIDAZOLAM HCL 5 MG/5ML IJ SOLN
INTRAMUSCULAR | Status: DC | PRN
  Administered 2020-12-05: 1 mg via INTRAVENOUS
  Administered 2020-12-05: 3 mg via INTRAVENOUS

## 2020-12-05 MED ORDER — METOPROLOL TARTRATE 25 MG/10 ML ORAL SUSPENSION
12.5000 mg | Freq: Two times a day (BID) | ORAL | Status: DC
  Filled 2020-12-05 (×4): qty 5

## 2020-12-05 MED ORDER — SODIUM CHLORIDE 0.9% FLUSH: 3.0000 mL | INTRAVENOUS | Status: DC | PRN

## 2020-12-05 MED ORDER — 0.9 % SODIUM CHLORIDE (POUR BTL) OPTIME
TOPICAL | Status: DC | PRN
  Administered 2020-12-05: 5000 mL

## 2020-12-05 MED ORDER — ALBUMIN HUMAN 5 % IV SOLN: INTRAVENOUS | Status: DC | PRN

## 2020-12-05 MED ORDER — FAMOTIDINE IN NACL 20-0.9 MG/50ML-% IV SOLN
20.0000 mg | Freq: Two times a day (BID) | INTRAVENOUS | Status: AC
  Administered 2020-12-05 – 2020-12-06 (×2): 20 mg via INTRAVENOUS
  Filled 2020-12-05 (×2): qty 50

## 2020-12-05 MED ORDER — SODIUM CHLORIDE 0.9 % IV SOLN: INTRAVENOUS | Status: DC | PRN

## 2020-12-05 MED ORDER — LACTATED RINGERS IV SOLN: 500.0000 mL | Freq: Once | INTRAVENOUS | Status: DC | PRN

## 2020-12-05 MED ORDER — VASOPRESSIN 20 UNIT/ML IV SOLN
INTRAVENOUS | Status: AC
  Filled 2020-12-05: qty 1

## 2020-12-05 MED ORDER — DEXTROSE 50 % IV SOLN
0.0000 mL | INTRAVENOUS | Status: DC | PRN
  Administered 2020-12-08: 25 mL via INTRAVENOUS
  Filled 2020-12-05 (×2): qty 50

## 2020-12-05 MED ORDER — ASPIRIN 81 MG PO CHEW
324.0000 mg | CHEWABLE_TABLET | Freq: Every day | ORAL | Status: DC
  Administered 2020-12-06: 324 mg
  Filled 2020-12-05: qty 4

## 2020-12-05 MED ORDER — EPHEDRINE 5 MG/ML INJ
INTRAVENOUS | Status: AC
  Filled 2020-12-05: qty 10

## 2020-12-05 MED ORDER — PHENYLEPHRINE 40 MCG/ML (10ML) SYRINGE FOR IV PUSH (FOR BLOOD PRESSURE SUPPORT)
PREFILLED_SYRINGE | INTRAVENOUS | Status: DC | PRN
  Administered 2020-12-05: 80 ug via INTRAVENOUS

## 2020-12-05 MED ORDER — CHLORHEXIDINE GLUCONATE CLOTH 2 % EX PADS: 6.0000 | MEDICATED_PAD | Freq: Every day | CUTANEOUS | Status: DC

## 2020-12-05 MED ORDER — METOPROLOL TARTRATE 12.5 MG HALF TABLET
12.5000 mg | ORAL_TABLET | Freq: Two times a day (BID) | ORAL | Status: DC
  Administered 2020-12-06 – 2020-12-09 (×6): 12.5 mg via ORAL
  Filled 2020-12-05 (×6): qty 1

## 2020-12-05 MED ORDER — PHENYLEPHRINE HCL-NACL 10-0.9 MG/250ML-% IV SOLN
INTRAVENOUS | Status: AC
  Filled 2020-12-05: qty 250

## 2020-12-05 MED ORDER — HEPARIN SODIUM (PORCINE) 1000 UNIT/ML IJ SOLN
INTRAMUSCULAR | Status: AC
  Filled 2020-12-05: qty 1

## 2020-12-05 MED ORDER — MAGNESIUM SULFATE 4 GM/100ML IV SOLN
4.0000 g | Freq: Once | INTRAVENOUS | Status: AC
  Administered 2020-12-05: 4 g via INTRAVENOUS
  Filled 2020-12-05: qty 100

## 2020-12-05 MED ORDER — DEXMEDETOMIDINE HCL IN NACL 400 MCG/100ML IV SOLN
0.4000 ug/kg/h | INTRAVENOUS | Status: DC
  Filled 2020-12-05: qty 100

## 2020-12-05 MED ORDER — BISACODYL 10 MG RE SUPP
10.0000 mg | Freq: Every day | RECTAL | Status: DC
  Administered 2020-12-06: 10 mg via RECTAL
  Filled 2020-12-05: qty 1

## 2020-12-05 MED ORDER — MIDAZOLAM HCL (PF) 10 MG/2ML IJ SOLN
INTRAMUSCULAR | Status: AC
  Filled 2020-12-05: qty 2

## 2020-12-05 MED ORDER — SODIUM CHLORIDE 0.9 % IV SOLN
250.0000 mL | INTRAVENOUS | Status: DC
  Administered 2020-12-06: 250 mL via INTRAVENOUS

## 2020-12-05 MED ORDER — INSULIN REGULAR(HUMAN) IN NACL 100-0.9 UT/100ML-% IV SOLN: INTRAVENOUS | Status: DC

## 2020-12-05 MED ORDER — FENTANYL CITRATE (PF) 250 MCG/5ML IJ SOLN
INTRAMUSCULAR | Status: DC | PRN
  Administered 2020-12-05 (×2): 100 ug via INTRAVENOUS
  Administered 2020-12-05: 50 ug via INTRAVENOUS
  Administered 2020-12-05 (×4): 100 ug via INTRAVENOUS
  Administered 2020-12-05 (×4): 50 ug via INTRAVENOUS

## 2020-12-05 MED ORDER — DOBUTAMINE IN D5W 4-5 MG/ML-% IV SOLN: 2.5000 ug/kg/min | INTRAVENOUS | Status: DC

## 2020-12-05 MED ORDER — SODIUM CHLORIDE 0.9% FLUSH
3.0000 mL | Freq: Two times a day (BID) | INTRAVENOUS | Status: DC
  Administered 2020-12-06 – 2020-12-07 (×5): 3 mL via INTRAVENOUS

## 2020-12-05 MED ORDER — ACETAMINOPHEN 160 MG/5ML PO SOLN
650.0000 mg | Freq: Once | ORAL | Status: AC
  Administered 2020-12-05: 650 mg

## 2020-12-05 MED ORDER — MIDAZOLAM HCL 2 MG/2ML IJ SOLN: 2.0000 mg | INTRAMUSCULAR | Status: DC | PRN

## 2020-12-05 MED ORDER — PROPOFOL 10 MG/ML IV BOLUS
INTRAVENOUS | Status: DC | PRN
Start: 2020-12-05 — End: 2020-12-05
  Administered 2020-12-05: 70 mg via INTRAVENOUS

## 2020-12-05 MED ORDER — DOCUSATE SODIUM 100 MG PO CAPS: 200.0000 mg | ORAL_CAPSULE | Freq: Every day | ORAL | Status: DC

## 2020-12-05 MED ORDER — HEPARIN SODIUM (PORCINE) 1000 UNIT/ML IJ SOLN
INTRAMUSCULAR | Status: DC | PRN
  Administered 2020-12-05: 13000 [IU] via INTRAVENOUS

## 2020-12-05 MED ORDER — ASPIRIN EC 325 MG PO TBEC
325.0000 mg | DELAYED_RELEASE_TABLET | Freq: Every day | ORAL | Status: DC
  Administered 2020-12-07 – 2020-12-09 (×3): 325 mg via ORAL
  Filled 2020-12-05 (×3): qty 1

## 2020-12-05 MED ORDER — ACETAMINOPHEN 500 MG PO TABS
1000.0000 mg | ORAL_TABLET | Freq: Four times a day (QID) | ORAL | Status: DC
  Administered 2020-12-06 – 2020-12-09 (×8): 1000 mg via ORAL
  Filled 2020-12-05 (×8): qty 2

## 2020-12-05 MED ORDER — METOPROLOL TARTRATE 5 MG/5ML IV SOLN
2.5000 mg | INTRAVENOUS | Status: DC | PRN
  Administered 2020-12-05: 5 mg via INTRAVENOUS
  Filled 2020-12-05: qty 5

## 2020-12-05 MED ORDER — NOREPINEPHRINE 4 MG/250ML-% IV SOLN
0.0000 ug/min | INTRAVENOUS | Status: DC
  Administered 2020-12-05: 2 ug/min via INTRAVENOUS
  Filled 2020-12-05: qty 250

## 2020-12-05 MED ORDER — PROTAMINE SULFATE 10 MG/ML IV SOLN
INTRAVENOUS | Status: DC | PRN
  Administered 2020-12-05: 3 mg via INTRAVENOUS
  Administered 2020-12-05: 8 mg via INTRAVENOUS

## 2020-12-05 MED ORDER — PANTOPRAZOLE SODIUM 40 MG PO TBEC: 40.0000 mg | DELAYED_RELEASE_TABLET | Freq: Every day | ORAL | Status: DC

## 2020-12-05 MED ORDER — ACETAMINOPHEN 650 MG RE SUPP: 650.0000 mg | Freq: Once | RECTAL | Status: AC

## 2020-12-05 MED ORDER — PROPOFOL 10 MG/ML IV BOLUS
INTRAVENOUS | Status: AC
  Filled 2020-12-05: qty 40

## 2020-12-05 MED ORDER — SODIUM CHLORIDE 0.9% FLUSH
10.0000 mL | Freq: Two times a day (BID) | INTRAVENOUS | Status: DC
  Administered 2020-12-05 – 2020-12-06 (×3): 10 mL
  Administered 2020-12-06: 20 mL
  Administered 2020-12-07: 10 mL

## 2020-12-05 MED ORDER — SODIUM CHLORIDE 0.9% FLUSH
10.0000 mL | INTRAVENOUS | Status: DC | PRN
Start: 2020-12-05 — End: 2020-12-09

## 2020-12-05 SURGICAL SUPPLY — 95 items
ADH SKN CLS APL DERMABOND .7 (GAUZE/BANDAGES/DRESSINGS) ×2
BAG DECANTER FOR FLEXI CONT (MISCELLANEOUS) ×3 IMPLANT
BLADE CLIPPER SURG (BLADE) ×3 IMPLANT
BLADE STERNUM SYSTEM 6 (BLADE) ×3 IMPLANT
BLADE SURG 11 STRL SS (BLADE) ×1 IMPLANT
BNDG CMPR MED 15X6 ELC VLCR LF (GAUZE/BANDAGES/DRESSINGS) ×4
BNDG ELASTIC 4X5.8 VLCR STR LF (GAUZE/BANDAGES/DRESSINGS) ×5 IMPLANT
BNDG ELASTIC 6X15 VLCR STRL LF (GAUZE/BANDAGES/DRESSINGS) ×2 IMPLANT
BNDG ELASTIC 6X5.8 VLCR STR LF (GAUZE/BANDAGES/DRESSINGS) ×4 IMPLANT
BNDG GAUZE ELAST 4 BULKY (GAUZE/BANDAGES/DRESSINGS) ×4 IMPLANT
CABLE SURGICAL S-101-97-12 (CABLE) ×3 IMPLANT
CANISTER SUCT 3000ML PPV (MISCELLANEOUS) ×3 IMPLANT
CANNULA MC2 2 STG 29/37 NON-V (CANNULA) ×2 IMPLANT
CANNULA MC2 TWO STAGE (CANNULA) ×3
CANNULA NON VENT 20FR 12 (CANNULA) ×3 IMPLANT
CATH ROBINSON RED A/P 18FR (CATHETERS) ×7 IMPLANT
CLIP RETRACTION 3.0MM CORONARY (MISCELLANEOUS) ×3 IMPLANT
CLIP VESOCCLUDE MED 24/CT (CLIP) IMPLANT
CLIP VESOCCLUDE SM WIDE 24/CT (CLIP) ×3 IMPLANT
CONN ST 1/2X1/2  BEN (MISCELLANEOUS) ×3
CONN ST 1/2X1/2 BEN (MISCELLANEOUS) ×2 IMPLANT
CONNECTOR BLAKE 2:1 CARIO BLK (MISCELLANEOUS) ×3 IMPLANT
COVER PROBE W GEL 5X96 (DRAPES) ×1 IMPLANT
DERMABOND ADVANCED (GAUZE/BANDAGES/DRESSINGS) ×1
DERMABOND ADVANCED .7 DNX12 (GAUZE/BANDAGES/DRESSINGS) IMPLANT
DRAIN CHANNEL 19F RND (DRAIN) ×9 IMPLANT
DRAIN CONNECTOR BLAKE 1:1 (MISCELLANEOUS) ×3 IMPLANT
DRAPE CARDIOVASCULAR INCISE (DRAPES) ×3
DRAPE INCISE IOBAN 66X45 STRL (DRAPES) IMPLANT
DRAPE SLUSH/WARMER DISC (DRAPES) ×3 IMPLANT
DRAPE SRG 135X102X78XABS (DRAPES) ×2 IMPLANT
DRSG AQUACEL AG ADV 3.5X10 (GAUZE/BANDAGES/DRESSINGS) ×3 IMPLANT
DRSG AQUACEL AG ADV 3.5X14 (GAUZE/BANDAGES/DRESSINGS) ×1 IMPLANT
DRSG COVADERM 4X14 (GAUZE/BANDAGES/DRESSINGS) ×3 IMPLANT
ELECT BLADE 4.0 EZ CLEAN MEGAD (MISCELLANEOUS) ×3
ELECT REM PT RETURN 9FT ADLT (ELECTROSURGICAL) ×6
ELECTRODE BLDE 4.0 EZ CLN MEGD (MISCELLANEOUS) ×2 IMPLANT
ELECTRODE REM PT RTRN 9FT ADLT (ELECTROSURGICAL) ×4 IMPLANT
FELT TEFLON 1X6 (MISCELLANEOUS) ×6 IMPLANT
FIBERTAPE STERNAL CLSR 2 36IN (SUTURE) ×1 IMPLANT
FIBERTAPE STERNAL CLSR 2X36 (SUTURE) ×2 IMPLANT
GAUZE SPONGE 4X4 12PLY STRL (GAUZE/BANDAGES/DRESSINGS) ×7 IMPLANT
GLOVE BIO SURGEON STRL SZ7 (GLOVE) ×6 IMPLANT
GLOVE BIOGEL M STRL SZ7.5 (GLOVE) ×7 IMPLANT
GLOVE SURG SS PI 7.5 STRL IVOR (GLOVE) ×1 IMPLANT
GOWN STRL REUS W/ TWL LRG LVL3 (GOWN DISPOSABLE) ×8 IMPLANT
GOWN STRL REUS W/ TWL XL LVL3 (GOWN DISPOSABLE) ×4 IMPLANT
GOWN STRL REUS W/TWL LRG LVL3 (GOWN DISPOSABLE) ×12
GOWN STRL REUS W/TWL XL LVL3 (GOWN DISPOSABLE) ×6
HEMOSTAT POWDER SURGIFOAM 1G (HEMOSTASIS) ×9 IMPLANT
INSERT SUTURE HOLDER (MISCELLANEOUS) ×3 IMPLANT
KIT BASIN OR (CUSTOM PROCEDURE TRAY) ×3 IMPLANT
KIT DRAINAGE VACCUM ASSIST (KITS) ×1 IMPLANT
KIT SUCTION CATH 14FR (SUCTIONS) ×3 IMPLANT
KIT TURNOVER KIT B (KITS) ×3 IMPLANT
KIT VASOVIEW HEMOPRO 2 VH 4000 (KITS) ×3 IMPLANT
LEAD PACING MYOCARDI (MISCELLANEOUS) ×4 IMPLANT
MARKER GRAFT CORONARY BYPASS (MISCELLANEOUS) ×9 IMPLANT
NS IRRIG 1000ML POUR BTL (IV SOLUTION) ×15 IMPLANT
PACK ACCESSORY CANNULA KIT (KITS) ×3 IMPLANT
PACK E OPEN HEART (SUTURE) ×3 IMPLANT
PACK OPEN HEART (CUSTOM PROCEDURE TRAY) ×3 IMPLANT
PAD ARMBOARD 7.5X6 YLW CONV (MISCELLANEOUS) ×6 IMPLANT
PAD ELECT DEFIB RADIOL ZOLL (MISCELLANEOUS) ×3 IMPLANT
PENCIL BUTTON HOLSTER BLD 10FT (ELECTRODE) ×3 IMPLANT
POSITIONER HEAD DONUT 9IN (MISCELLANEOUS) ×3 IMPLANT
PUNCH AORTIC ROTATE 4.0MM (MISCELLANEOUS) ×3 IMPLANT
SET CARDIOPLEGIA MPS 5001102 (MISCELLANEOUS) ×1 IMPLANT
SOL ANTI FOG 6CC (MISCELLANEOUS) IMPLANT
SOLUTION ANTI FOG 6CC (MISCELLANEOUS) ×1
SPONGE LAP 18X18 X RAY DECT (DISPOSABLE) ×1 IMPLANT
SUPPORT HEART JANKE-BARRON (MISCELLANEOUS) ×3 IMPLANT
SUT BONE WAX W31G (SUTURE) ×3 IMPLANT
SUT ETHIBOND X763 2 0 SH 1 (SUTURE) ×6 IMPLANT
SUT ETHILON 3 0 FSL (SUTURE) ×5 IMPLANT
SUT ETHILON 4 0 PS 2 18 (SUTURE) ×5 IMPLANT
SUT MNCRL AB 3-0 PS2 18 (SUTURE) ×6 IMPLANT
SUT MNCRL AB 4-0 PS2 18 (SUTURE) ×3 IMPLANT
SUT PDS AB 1 CTX 36 (SUTURE) ×6 IMPLANT
SUT PROLENE 4 0 SH DA (SUTURE) ×5 IMPLANT
SUT PROLENE 5 0 C 1 36 (SUTURE) ×9 IMPLANT
SUT PROLENE 7 0 BV 1 (SUTURE) ×2 IMPLANT
SUT PROLENE 7 0 BV1 MDA (SUTURE) ×3 IMPLANT
SUT STEEL 6MS V (SUTURE) ×5 IMPLANT
SUT VIC AB 2-0 CT1 27 (SUTURE) ×15
SUT VIC AB 2-0 CT1 TAPERPNT 27 (SUTURE) IMPLANT
SYSTEM SAHARA CHEST DRAIN ATS (WOUND CARE) ×3 IMPLANT
TAPE CLOTH SURG 4X10 WHT LF (GAUZE/BANDAGES/DRESSINGS) ×1 IMPLANT
TAPE PAPER 2X10 WHT MICROPORE (GAUZE/BANDAGES/DRESSINGS) ×1 IMPLANT
TOWEL GREEN STERILE (TOWEL DISPOSABLE) ×3 IMPLANT
TOWEL GREEN STERILE FF (TOWEL DISPOSABLE) ×3 IMPLANT
TRAY FOLEY SLVR 16FR TEMP STAT (SET/KITS/TRAYS/PACK) ×3 IMPLANT
TUBING LAP HI FLOW INSUFFLATIO (TUBING) ×3 IMPLANT
UNDERPAD 30X36 HEAVY ABSORB (UNDERPADS AND DIAPERS) ×3 IMPLANT
WATER STERILE IRR 1000ML POUR (IV SOLUTION) ×6 IMPLANT

## 2020-12-05 NOTE — Brief Op Note (Signed)
11/29/2020 - 12/05/2020  9:11 AM  PATIENT:  Mariah Peterson  75 y.o. female  PRE-OPERATIVE DIAGNOSIS:  CAD  POST-OPERATIVE DIAGNOSIS:  CAD  PROCEDURE:  Procedure(s): CORONARY ARTERY BYPASS GRAFTING (CABG), ON PUMP, TIMES FOUR, USING LEFT INTERNAL MAMMARY ARTERY AND BILATERAL ENDOSCOPICALLY HARVESTED GREATER SAPHENOUS VEINS (N/A) TRANSESOPHAGEAL ECHOCARDIOGRAM (TEE) (N/A)  SV time: right 30 minutes open (with skip lesions), prep 10 minutes     Left 20 minutes open (with skip lesions), prep 10 minutes LIMA to LAD SVG to OM1 SVG to Ramus SVG to PDA  SURGEON:  Surgeon(s) and Role:    * Lightfoot, Lucile Crater, MD - Primary  PHYSICIAN ASSISTANT:  Nicholes Rough, PA-C Jadene Pierini, PA-C   ANESTHESIA:   general  EBL:  665 mL   BLOOD ADMINISTERED:none  DRAINS:  ROUTINE    LOCAL MEDICATIONS USED:  NONE  SPECIMEN:  No Specimen  DISPOSITION OF SPECIMEN:  NA  COUNTS:  YES  DICTATION: .Dragon Dictation  PLAN OF CARE: Admit to inpatient   PATIENT DISPOSITION:  ICU - intubated and hemodynamically stable.   Delay start of Pharmacological VTE agent (>24hrs) due to surgical blood loss or risk of bleeding: yes

## 2020-12-05 NOTE — Anesthesia Procedure Notes (Signed)
Central Venous Catheter Insertion Performed by: Effie Berkshire, MD, anesthesiologist Start/End2/14/2022 7:00 AM, 12/05/2020 7:10 AM Patient location: Pre-op. Preanesthetic checklist: patient identified, IV checked, site marked, risks and benefits discussed, surgical consent, monitors and equipment checked, pre-op evaluation, timeout performed and anesthesia consent Position: Trendelenburg Lidocaine 1% used for infiltration and patient sedated Hand hygiene performed , maximum sterile barriers used  and Seldinger technique used Catheter size: 8.5 Fr Total catheter length 10. Central line was placed.Sheath introducer Swan type:thermodilution PA Cath depth:50 Procedure performed using ultrasound guided technique. Ultrasound Notes:anatomy identified, needle tip was noted to be adjacent to the nerve/plexus identified, no ultrasound evidence of intravascular and/or intraneural injection and image(s) printed for medical record Attempts: 1 Following insertion, line sutured and dressing applied. Post procedure assessment: blood return through all ports, free fluid flow and no air  Patient tolerated the procedure well with no immediate complications.

## 2020-12-05 NOTE — Transfer of Care (Signed)
Immediate Anesthesia Transfer of Care Note  Patient: Mariah Peterson  Procedure(s) Performed: CORONARY ARTERY BYPASS GRAFTING (CABG), ON PUMP, TIMES FOUR, USING LEFT INTERNAL MAMMARY ARTERY AND BILATERAL ENDOSCOPICALLY HARVESTED GREATER SAPHENOUS VEINS (N/A Chest) TRANSESOPHAGEAL ECHOCARDIOGRAM (TEE) (N/A )  Patient Location: ICU  Anesthesia Type:General  Level of Consciousness: sedated and Patient remains intubated per anesthesia plan  Airway & Oxygen Therapy: Patient remains intubated per anesthesia plan and Patient placed on Ventilator (see vital sign flow sheet for setting)  Post-op Assessment: Report given to RN and Post -op Vital signs reviewed and stable  Post vital signs: Reviewed and stable  Last Vitals:  Vitals Value Taken Time  BP 138/74   Temp    Pulse 89   Resp 14 (vent)   SpO2 Poor pleth, RN team aware and collecting post-tranport blood gas.     Last Pain:  Vitals:   12/05/20 0532  TempSrc: Oral  PainSc:       Patients Stated Pain Goal: 0 (70/92/95 7473)  Complications: No complications documented.

## 2020-12-05 NOTE — Anesthesia Preprocedure Evaluation (Addendum)
Anesthesia Evaluation  Patient identified by MRN, date of birth, ID band Patient awake    Reviewed: Allergy & Precautions, NPO status , Patient's Chart, lab work & pertinent test results  Airway Mallampati: I  TM Distance: <3 FB Neck ROM: Full    Dental  (+) Edentulous Upper, Edentulous Lower   Pulmonary neg pulmonary ROS,    Pulmonary exam normal        Cardiovascular hypertension, Pt. on medications + Past MI   Rhythm:Regular Rate:Normal     Neuro/Psych negative neurological ROS  negative psych ROS   GI/Hepatic negative GI ROS, Neg liver ROS,   Endo/Other  diabetes, Type 1, Insulin Dependent  Renal/GU negative Renal ROS     Musculoskeletal negative musculoskeletal ROS (+)   Abdominal Normal abdominal exam  (+)   Peds  Hematology negative hematology ROS (+)   Anesthesia Other Findings   Reproductive/Obstetrics                            Anesthesia Physical Anesthesia Plan  ASA: IV  Anesthesia Plan: General   Post-op Pain Management:    Induction: Intravenous  PONV Risk Score and Plan: 3 and Ondansetron, Dexamethasone and Midazolam  Airway Management Planned: Oral ETT  Additional Equipment: Arterial line, CVP, TEE and Ultrasound Guidance Line Placement  Intra-op Plan:   Post-operative Plan: Extubation in OR  Informed Consent: I have reviewed the patients History and Physical, chart, labs and discussed the procedure including the risks, benefits and alternatives for the proposed anesthesia with the patient or authorized representative who has indicated his/her understanding and acceptance.     Dental advisory given  Plan Discussed with: CRNA  Anesthesia Plan Comments: (Echo:  1. Left ventricular ejection fraction, by estimation, is 55 to 60%. The  left ventricle has normal function. The left ventricle has no regional  wall motion abnormalities. Left ventricular  diastolic parameters were  normal.  2. Right ventricular systolic function is normal. The right ventricular  size is normal.  3. The mitral valve is normal in structure. Trivial mitral valve  regurgitation. No evidence of mitral stenosis.  4. The aortic valve is normal in structure. Aortic valve regurgitation is  not visualized. No aortic stenosis is present.  5. The inferior vena cava is normal in size with greater than 50%  respiratory variability, suggesting right atrial pressure of 3 mmHg. )       Anesthesia Quick Evaluation

## 2020-12-05 NOTE — Care Plan (Signed)
Patient taken to OR for CABG this morning, will transition to CT surgery service for post-op care per routine.    The hospitalist service will sign off.  Please call back if questions.

## 2020-12-05 NOTE — Anesthesia Postprocedure Evaluation (Signed)
Anesthesia Post Note  Patient: Mariah Peterson  Procedure(s) Performed: CORONARY ARTERY BYPASS GRAFTING (CABG), ON PUMP, TIMES FOUR, USING LEFT INTERNAL MAMMARY ARTERY AND BILATERAL ENDOSCOPICALLY HARVESTED GREATER SAPHENOUS VEINS (N/A Chest) TRANSESOPHAGEAL ECHOCARDIOGRAM (TEE) (N/A )     Patient location during evaluation: SICU Anesthesia Type: General Level of consciousness: sedated Pain management: pain level controlled Vital Signs Assessment: post-procedure vital signs reviewed and stable Respiratory status: patient remains intubated per anesthesia plan Cardiovascular status: stable Postop Assessment: no apparent nausea or vomiting Anesthetic complications: no   No complications documented.  Last Vitals:  Vitals:   12/05/20 1700 12/05/20 1745  BP: 114/69   Pulse:  82  Resp: 12   Temp: (!) 35.7 C   SpO2:      Last Pain:  Vitals:   12/05/20 1600  TempSrc: Bladder  PainSc:                  Effie Berkshire

## 2020-12-05 NOTE — Plan of Care (Signed)
  Problem: Cardiovascular: Goal: Ability to achieve and maintain adequate cardiovascular perfusion will improve Outcome: Progressing Goal: Vascular access site(s) Level 0-1 will be maintained Outcome: Progressing   Problem: Education: Goal: Knowledge of General Education information will improve Description: Including pain rating scale, medication(s)/side effects and non-pharmacologic comfort measures Outcome: Not Progressing   Problem: Health Behavior/Discharge Planning: Goal: Ability to manage health-related needs will improve Outcome: Not Progressing   Problem: Education: Goal: Understanding of CV disease, CV risk reduction, and recovery process will improve Outcome: Not Progressing Goal: Individualized Educational Video(s) Outcome: Not Progressing   Problem: Activity: Goal: Ability to return to baseline activity level will improve Outcome: Not Progressing   Problem: Health Behavior/Discharge Planning: Goal: Ability to safely manage health-related needs after discharge will improve Outcome: Not Progressing

## 2020-12-05 NOTE — Anesthesia Procedure Notes (Signed)
Procedure Name: Intubation Date/Time: 12/05/2020 7:43 AM Performed by: Lowella Dell, CRNA Pre-anesthesia Checklist: Patient identified, Emergency Drugs available, Suction available and Patient being monitored Patient Re-evaluated:Patient Re-evaluated prior to induction Oxygen Delivery Method: Circle System Utilized Preoxygenation: Pre-oxygenation with 100% oxygen Induction Type: IV induction Ventilation: Mask ventilation without difficulty Laryngoscope Size: Mac and 3 Grade View: Grade II Tube type: Oral Tube size: 7.0 mm Number of attempts: 1 Airway Equipment and Method: Stylet and Oral airway Placement Confirmation: ETT inserted through vocal cords under direct vision,  positive ETCO2 and breath sounds checked- equal and bilateral Secured at: 22 cm Tube secured with: Tape Dental Injury: Teeth and Oropharynx as per pre-operative assessment  Comments: Performed by Sueanne Margarita, SRNA

## 2020-12-05 NOTE — Progress Notes (Signed)
  Echocardiogram Echocardiogram Transesophageal has been performed.  Mariah Peterson 12/05/2020, 8:14 AM

## 2020-12-05 NOTE — Op Note (Signed)
[address]       [place] [address]             [phone]                                          12/05/2020 Patient:  [name] Pre-Op Dx: Coronary artery disease   Diabetes mellitus Post-op Dx: Same Procedure: CABG X 4, LIMA to LAD, reverse saphenous vein graft to PDA, OM1, and ramus intermedius  greater saphenous vein harvest on the right and left thigh   Surgeon and Role:      * Evynn Boutelle, Lucile Crater, MD - Primary    Evonnie Pat, PA-C- assisting Assistant: T. Harriet Pho, PA-C  Anesthesia  general EBL: 500 ml Blood Administration: 4 units of packed red blood cells, 1 unit of platelets Xclamp Time: 58 min Pump Time: 114 min  Drains: 19 F blake drain: L, mediastinal  Wires: Ventricular Counts: correct    Indications: Mrs. Heiny is a 38 woman with a past history of type II insulin dependent diabetes, hypertension, hyperlipidemia and glaucoma. She has no prior history of CAD but does have a family history.  She was in her usual state of health until 11/29/20. She developed sudden onset of severe substernal chest tightness with shortness of breath. No radiation. She thought it was indigestion initially but the symptoms persisted and she came to ED. She ruled in for MI with serial enzymes.   Has been pain free since admission.  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 EF was normal and no valvular pathology by echo.  Findings: Heavily calcified LAD.  Anastomosis was placed in the distal LAD.  Intramyocardial ramus intermedius.  Calcified obtuse marginal.  Small PDA.  Good sized LIMA.  Good sized vein grafts.  Operative Technique: All invasive lines were placed in pre-op holding.  After the risks, benefits and alternatives were thoroughly discussed, the patient was brought to the operative theatre.  Anesthesia was induced, and the patient was prepped and draped in normal sterile  fashion.  An appropriate surgical pause was performed, and pre-operative antibiotics were dosed accordingly.  We began with simultaneous incisions along the left leg for harvesting of the greater saphenous vein and the chest for the sternotomy.  In regards to the sternotomy, this was carried down with bovie cautery, and the sternum was divided with a reciprocating saw.  Meticulous hemostasis was obtained.  The left internal thoracic artery was exposed and harvested in in pedicled fashion.  The patient was systemically heparinized, and the artery was divided distally, and placed in a papaverine sponge.    The sternal elevator was removed, and a retractor was placed.  The pericardium was divided in the midline and fashioned into a cradle with pericardial stitches.   After we confirmed an appropriate ACT, the ascending aorta was cannulated in standard fashion.  The right atrial appendage was used for venous cannulation site.  Cardiopulmonary bypass was initiated, and the heart retractor was placed. The cross clamp was applied, and a dose of anterograde cardioplegia was given with good arrest of the heart.  We moved to the posterior wall of the heart, and found a good target on the PDA.  An arteriotomy was made, and the vein graft was anastomosed to it in an end to side  fashion.  Next we exposed the lateral wall, and found a good target on the OM1.  An end to side anastomosis with the vein graft was then created.  Next, we exposed the anterior wall of the heart and identified a good target on ramus intermedius.   An arteriotomy was created.  The vein was anastomosed in an end to side fashion.  Finally, we exposed a good target on the LAD, and fashioned an end to side anastomosis between it and the LITA.  We began to re-warm, and a re-animation dose of cardioplegia was given.  The heart was de-aired, and the cross clamp was removed.  Meticulous hemostasis was obtained.    A partial occludding clamp was then placed  on the ascending aorta, and we created an end to side anastomosis between it and the proximal vein grafts.  The proximal sites were marked with rings.  Hemostasis was obtained, and we separated from cardiopulmonary bypass without event.the heparin was reversed with protamine.  Chest tubes and wires were placed, and the sternum was re-approximated with with sternal wires.  The soft tissue and skin were re-approximated wth absorbable suture.    The patient tolerated the procedure without any immediate complications, and was transferred to the ICU in guarded condition.  Tianah Lonardo Bary Leriche

## 2020-12-05 NOTE — Anesthesia Procedure Notes (Signed)
Arterial Line Insertion Start/End2/14/2022 6:45 AM, 12/05/2020 6:50 AM Performed by: Lowella Dell, CRNA, CRNA  Preanesthetic checklist: patient identified, IV checked, site marked, risks and benefits discussed, surgical consent, monitors and equipment checked, pre-op evaluation, timeout performed and anesthesia consent Lidocaine 1% used for infiltration and patient sedated Left, radial was placed Catheter size: 20 G Hand hygiene performed  and maximum sterile barriers used   Attempts: 1 Procedure performed without using ultrasound guided technique. Following insertion, Biopatch. Post procedure assessment: normal  Patient tolerated the procedure well with no immediate complications. Additional procedure comments: Placed by SRNA.

## 2020-12-05 NOTE — Progress Notes (Signed)
     [address]       [place] [address]             [phone]       No events Vitals:   12/04/20 2022 12/05/20 0532  BP: (!) 122/59 121/66  Pulse: 87   Resp: 16 16  Temp: 99.2 F (37.3 C) 98.2 F (36.8 C)  SpO2:  97%   Sinus EWOB  75 yo female with hx of DM, and CAD OR today for CABG 4.

## 2020-12-06 ENCOUNTER — Encounter (HOSPITAL_COMMUNITY): Payer: Self-pay | Admitting: Thoracic Surgery (Cardiothoracic Vascular Surgery)

## 2020-12-06 ENCOUNTER — Ambulatory Visit: Payer: Self-pay | Admitting: *Deleted

## 2020-12-06 ENCOUNTER — Inpatient Hospital Stay (HOSPITAL_COMMUNITY): Payer: Medicare Other

## 2020-12-06 DIAGNOSIS — Z951 Presence of aortocoronary bypass graft: Secondary | ICD-10-CM

## 2020-12-06 LAB — BASIC METABOLIC PANEL
Anion gap: 8 (ref 5–15)
Anion gap: 8 (ref 5–15)
BUN: 25 mg/dL — ABNORMAL HIGH (ref 8–23)
BUN: 26 mg/dL — ABNORMAL HIGH (ref 8–23)
CO2: 19 mmol/L — ABNORMAL LOW (ref 22–32)
CO2: 19 mmol/L — ABNORMAL LOW (ref 22–32)
Calcium: 7.8 mg/dL — ABNORMAL LOW (ref 8.9–10.3)
Calcium: 8.2 mg/dL — ABNORMAL LOW (ref 8.9–10.3)
Chloride: 109 mmol/L (ref 98–111)
Chloride: 109 mmol/L (ref 98–111)
Creatinine, Ser: 1.31 mg/dL — ABNORMAL HIGH (ref 0.44–1.00)
Creatinine, Ser: 1.45 mg/dL — ABNORMAL HIGH (ref 0.44–1.00)
GFR, Estimated: 43 mL/min — ABNORMAL LOW (ref >60)
GFR, Estimated: 38 mL/min — ABNORMAL LOW (ref >60)
Glucose, Bld: 195 mg/dL — ABNORMAL HIGH (ref 70–99)
Glucose, Bld: 87 mg/dL (ref 70–99)
Potassium: 4.8 mmol/L (ref 3.5–5.1)
Potassium: 4.1 mmol/L (ref 3.5–5.1)
Sodium: 136 mmol/L (ref 135–145)
Sodium: 136 mmol/L (ref 135–145)

## 2020-12-06 LAB — BPAM PLATELET PHERESIS
Blood Product Expiration Date: 202202162359
ISSUE DATE / TIME: 202202141240
Unit Type and Rh: 5100

## 2020-12-06 LAB — CBC
HCT: 32.6 % — ABNORMAL LOW (ref 36.0–46.0)
HCT: 31.3 % — ABNORMAL LOW (ref 36.0–46.0)
HCT: 30.6 % — ABNORMAL LOW (ref 36.0–46.0)
Hemoglobin: 11.5 g/dL — ABNORMAL LOW (ref 12.0–15.0)
Hemoglobin: 10.7 g/dL — ABNORMAL LOW (ref 12.0–15.0)
Hemoglobin: 11.1 g/dL — ABNORMAL LOW (ref 12.0–15.0)
MCH: 29.6 pg (ref 26.0–34.0)
MCH: 28.8 pg (ref 26.0–34.0)
MCH: 30.5 pg (ref 26.0–34.0)
MCHC: 35.3 g/dL (ref 30.0–36.0)
MCHC: 34.2 g/dL (ref 30.0–36.0)
MCHC: 36.3 g/dL — ABNORMAL HIGH (ref 30.0–36.0)
MCV: 83.8 fL (ref 80.0–100.0)
MCV: 84.4 fL (ref 80.0–100.0)
MCV: 84.1 fL (ref 80.0–100.0)
Platelets: 177 10*3/uL (ref 150–400)
Platelets: 170 10*3/uL (ref 150–400)
Platelets: 166 10*3/uL (ref 150–400)
RBC: 3.89 MIL/uL (ref 3.87–5.11)
RBC: 3.71 MIL/uL — ABNORMAL LOW (ref 3.87–5.11)
RBC: 3.64 MIL/uL — ABNORMAL LOW (ref 3.87–5.11)
RDW: 15.9 % — ABNORMAL HIGH (ref 11.5–15.5)
RDW: 16.5 % — ABNORMAL HIGH (ref 11.5–15.5)
RDW: 16.9 % — ABNORMAL HIGH (ref 11.5–15.5)
WBC: 7.3 10*3/uL (ref 4.0–10.5)
WBC: 6.3 10*3/uL (ref 4.0–10.5)
WBC: 7.5 10*3/uL (ref 4.0–10.5)
nRBC: 0 % (ref 0.0–0.2)
nRBC: 0 % (ref 0.0–0.2)
nRBC: 0 % (ref 0.0–0.2)

## 2020-12-06 LAB — POCT I-STAT 7, (LYTES, BLD GAS, ICA,H+H)
Acid-base deficit: 6 mmol/L — ABNORMAL HIGH (ref 0.0–2.0)
Bicarbonate: 18.1 mmol/L — ABNORMAL LOW (ref 20.0–28.0)
Calcium, Ion: 1.07 mmol/L — ABNORMAL LOW (ref 1.15–1.40)
HCT: 27 % — ABNORMAL LOW (ref 36.0–46.0)
Hemoglobin: 9.2 g/dL — ABNORMAL LOW (ref 12.0–15.0)
O2 Saturation: 98 %
Potassium: 4.4 mmol/L (ref 3.5–5.1)
Sodium: 141 mmol/L (ref 135–145)
TCO2: 19 mmol/L — ABNORMAL LOW (ref 22–32)
pCO2 arterial: 28.9 mmHg — ABNORMAL LOW (ref 32.0–48.0)
pH, Arterial: 7.404 (ref 7.350–7.450)
pO2, Arterial: 95 mmHg (ref 83.0–108.0)

## 2020-12-06 LAB — GLUCOSE, CAPILLARY
Glucose-Capillary: 118 mg/dL — ABNORMAL HIGH (ref 70–99)
Glucose-Capillary: 124 mg/dL — ABNORMAL HIGH (ref 70–99)
Glucose-Capillary: 132 mg/dL — ABNORMAL HIGH (ref 70–99)
Glucose-Capillary: 140 mg/dL — ABNORMAL HIGH (ref 70–99)
Glucose-Capillary: 142 mg/dL — ABNORMAL HIGH (ref 70–99)
Glucose-Capillary: 148 mg/dL — ABNORMAL HIGH (ref 70–99)
Glucose-Capillary: 154 mg/dL — ABNORMAL HIGH (ref 70–99)
Glucose-Capillary: 178 mg/dL — ABNORMAL HIGH (ref 70–99)
Glucose-Capillary: 179 mg/dL — ABNORMAL HIGH (ref 70–99)
Glucose-Capillary: 180 mg/dL — ABNORMAL HIGH (ref 70–99)
Glucose-Capillary: 81 mg/dL (ref 70–99)
Glucose-Capillary: 84 mg/dL (ref 70–99)

## 2020-12-06 LAB — MAGNESIUM
Magnesium: 3.3 mg/dL — ABNORMAL HIGH (ref 1.7–2.4)
Magnesium: 3.1 mg/dL — ABNORMAL HIGH (ref 1.7–2.4)

## 2020-12-06 LAB — PREPARE PLATELET PHERESIS: Unit division: 0

## 2020-12-06 LAB — CREATININE, SERUM
Creatinine, Ser: 1.32 mg/dL — ABNORMAL HIGH (ref 0.44–1.00)
GFR, Estimated: 43 mL/min — ABNORMAL LOW (ref >60)

## 2020-12-06 MED ORDER — DOCUSATE SODIUM 50 MG/5ML PO LIQD: 200.0000 mg | Freq: Every day | ORAL | Status: DC

## 2020-12-06 MED ORDER — ROSUVASTATIN CALCIUM 20 MG PO TABS: 40.0000 mg | ORAL_TABLET | Freq: Every day | ORAL | Status: DC

## 2020-12-06 MED ORDER — DOBUTAMINE IN D5W 4-5 MG/ML-% IV SOLN: 2.5000 ug/kg/min | INTRAVENOUS | Status: DC

## 2020-12-06 MED ORDER — INSULIN DETEMIR 100 UNIT/ML ~~LOC~~ SOLN
10.0000 [IU] | Freq: Every day | SUBCUTANEOUS | Status: DC
  Administered 2020-12-07: 10 [IU] via SUBCUTANEOUS
  Filled 2020-12-06 (×2): qty 0.1

## 2020-12-06 MED ORDER — INSULIN ASPART 100 UNIT/ML ~~LOC~~ SOLN
0.0000 [IU] | SUBCUTANEOUS | Status: DC
  Administered 2020-12-06 – 2020-12-08 (×5): 2 [IU] via SUBCUTANEOUS

## 2020-12-06 MED ORDER — PANTOPRAZOLE SODIUM 40 MG PO PACK: 40.0000 mg | PACK | Freq: Every day | ORAL | Status: DC

## 2020-12-06 MED ORDER — TRAMADOL HCL 50 MG PO TABS
50.0000 mg | ORAL_TABLET | ORAL | Status: DC | PRN
  Administered 2020-12-06: 50 mg
  Filled 2020-12-06: qty 1

## 2020-12-06 MED ORDER — DEXMEDETOMIDINE HCL IN NACL 400 MCG/100ML IV SOLN: 0.4000 ug/kg/h | INTRAVENOUS | Status: DC

## 2020-12-06 MED ORDER — INSULIN DETEMIR 100 UNIT/ML ~~LOC~~ SOLN
10.0000 [IU] | Freq: Once | SUBCUTANEOUS | Status: AC
  Administered 2020-12-06: 10 [IU] via SUBCUTANEOUS
  Filled 2020-12-06 (×2): qty 0.1

## 2020-12-06 MED ORDER — OXYCODONE HCL 5 MG PO TABS
5.0000 mg | ORAL_TABLET | ORAL | Status: DC | PRN
  Administered 2020-12-07: 10 mg
  Filled 2020-12-06: qty 2

## 2020-12-06 MED ORDER — ENOXAPARIN SODIUM 30 MG/0.3ML ~~LOC~~ SOLN
30.0000 mg | Freq: Every day | SUBCUTANEOUS | Status: DC
  Administered 2020-12-06 – 2020-12-08 (×3): 30 mg via SUBCUTANEOUS
  Filled 2020-12-06 (×3): qty 0.3

## 2020-12-06 MED FILL — Heparin Sodium (Porcine) Inj 1000 Unit/ML: INTRAMUSCULAR | Qty: 30 | Status: AC

## 2020-12-06 MED FILL — Potassium Chloride Inj 2 mEq/ML: INTRAVENOUS | Qty: 40 | Status: AC

## 2020-12-06 MED FILL — Lidocaine HCl Local Preservative Free (PF) Inj 2%: INTRAMUSCULAR | Qty: 15 | Status: AC

## 2020-12-06 NOTE — Procedures (Signed)
Extubation Procedure Note  Patient Details:   Name: Mariah Peterson DOB: 1946/05/06 MRN: [id number]   Airway Documentation:    Vent end date: 12/06/20 Vent end time: 1152   Evaluation  O2 sats: stable throughout Complications: No apparent complications Patient did tolerate procedure well. Bilateral Breath Sounds: Clear   Yes   Patient was able to perform a VC of 450 ml's and a NIF of -22 prior to extubation. Cuff leak was heard. No stridor noted. RN at the bedside with RT during extubation. Patient placed on a 4L [place].  Renato Gails [facility] 12/06/2020, 11:55 AM

## 2020-12-06 NOTE — Progress Notes (Signed)
Dr. Kipp Brood at bedside during extubation.  Chest tubes downsized by Dr. Kipp Brood to bulb suction and Epicardial wire d/c'd also.  Will monitor for any changes.

## 2020-12-06 NOTE — Progress Notes (Signed)
[address]       [place] [address]             [phone]                 1 Day Post-Op Procedure(s) (LRB): CORONARY ARTERY BYPASS GRAFTING (CABG), ON PUMP, TIMES FOUR, USING LEFT INTERNAL MAMMARY ARTERY AND BILATERAL ENDOSCOPICALLY HARVESTED GREATER SAPHENOUS VEINS (N/A) TRANSESOPHAGEAL ECHOCARDIOGRAM (TEE) (N/A)   Events: No events Wakes up and moves all extremities, and then goes back to sleep _______________________________________________________________ Vitals: BP (!) 98/59   Pulse 85   Temp (!) 96.44 F (35.8 C)   Resp (!) 21   Ht 4\' 11"  (1.499 m)   Wt 49.1 kg   SpO2 99%   BMI 21.86 kg/m   - Neuro: arousable.  Follows commands, non-focal  - Cardiovascular: sinus.  Drips: none.   CVP:  [4 mmHg-18 mmHg] 8 mmHg  - Pulm: EWOB, SS CT output Vent Mode: SIMV;PRVC FiO2 (%):  [50 %] 50 % Set Rate:  [12 bmp] 12 bmp Vt Set:  [350 mL] 350 mL PEEP:  [5 cmH20] 5 cmH20 Pressure Support:  [10 cmH20] 10 cmH20 Plateau Pressure:  [18 cmH20-29 cmH20] 20 cmH20  ABG    Component Value Date/Time   PHART 7.358 12/05/2020 1435   PCO2ART 38.1 12/05/2020 1435   PO2ART 67 (L) 12/05/2020 1435   HCO3 21.6 12/05/2020 1435   TCO2 23 12/05/2020 1435   ACIDBASEDEF 4.0 (H) 12/05/2020 1435   O2SAT 93.0 12/05/2020 1435    - Abd: soft - Extremity: warm  .Intake/Output      02/14 0701 02/15 0700 02/15 0701 02/16 0700   P.O.     I.V. (mL/kg) 2676 (54.5)    Blood 919    NG/GT 100    IV Piggyback 1132.2    Total Intake(mL/kg) 4827.2 (98.3)    Urine (mL/kg/hr) 1915 (1.6)    Other 4575    Blood 665    Chest Tube 455    Total Output 7610    Net -2782.8            _______________________________________________________________ Labs: CBC Latest Ref Rng & Units 12/06/2020 12/05/2020 12/05/2020  WBC 4.0 - 10.5 K/uL 7.3 5.7 3.3(L)  Hemoglobin 12.0 - 15.0 g/dL 11.5(L) 11.7(L) 12.6  Hematocrit 36.0 - 46.0 % 32.6(L) 33.5(L) 36.4  Platelets 150 - 400 K/uL  177 174 176   CMP Latest Ref Rng & Units 12/06/2020 12/05/2020 12/05/2020  Glucose 70 - 99 mg/dL 195(H) 185(H) -  BUN 8 - 23 mg/dL 25(H) 25(H) -  Creatinine 0.44 - 1.00 mg/dL 1.31(H) 1.18(H) -  Sodium 135 - 145 mmol/L 136 137 143  Potassium 3.5 - 5.1 mmol/L 4.8 4.4 4.7  Chloride 98 - 111 mmol/L 109 107 -  CO2 22 - 32 mmol/L 19(L) 19(L) -  Calcium 8.9 - 10.3 mg/dL 7.8(L) 7.6(L) -  Total Protein 6.5 - 8.1 g/dL - - -  Total Bilirubin 0.3 - 1.2 mg/dL - - -  Alkaline Phos 38 - 126 U/L - - -  AST 15 - 41 U/L - - -  ALT 0 - 44 U/L - - -    CXR: clear  _______________________________________________________________  Assessment and Plan: POD 1 s/p CABG 4  Neuro: off sedation.  Follows commands, lethargic CV: off all gtts.  Hypotensive, but good hemodynamics.  Will start midodrine today.  On asp/statin.  Will keep a-line Pulm: wean to extubate Renal: good uop.  Creat improved. GI: npo for now.  Will start clears once extubated Heme: stable ID: afebrile Endo: SSI Dispo: continue ICU care   [name] 12/06/2020 8:17 AM

## 2020-12-06 NOTE — Hospital Course (Signed)
HPI:   Mrs. Mariah Peterson is a 56 woman with a past history of type II insulin dependent diabetes, hypertension, hyperlipidemia and glaucoma. She has no prior history of CAD but does have a family history.   She was in her usual state of health until 11/29/20. She developed sudden onset of severe substernal chest tightness with shortness of breath. No radiation. She thought it was indigestion initially but the symptoms persisted and she came to ED. She ruled in for MI with serial enzymes.    Has been pain free since admission.   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 EF was normal and no valvular pathology by echo.  Hospital Course:   On 12/05/2020 Ms. Mariah Peterson underwent a coronary artery bypass grafting x 4 with Dr. Kipp Brood. She tolerated the procedure well and was transferred to the surgical ICU for continued care. She was extubated the following morning around noon. Her chest tubes were downsized to bulb suction and her EPW were discontinued. We started midodrine since she was a little hypotensive but her hemodynamics were good. She continued to progress.   02/17: Patient remained in sinus rhythm. Her creatinine increased from 1.59 to 1.78. Of note, her creatinine upon admission was 1.55. Lasix was held. PA/LAT CXR was done 02/18 and showed ***.

## 2020-12-06 NOTE — Progress Notes (Signed)
Patient ID: Mariah Peterson, female   DOB: June 23, 1946, 76 y.o.   MRN: [id number] T[facility] Evening Rounds:  Hemodynamically stable in sinus rhythm. CI 2.9  Extubated today.  Urine output ok  CT output low.

## 2020-12-07 ENCOUNTER — Inpatient Hospital Stay (HOSPITAL_COMMUNITY): Payer: Medicare Other

## 2020-12-07 LAB — BASIC METABOLIC PANEL
Anion gap: 8 (ref 5–15)
BUN: 26 mg/dL — ABNORMAL HIGH (ref 8–23)
CO2: 20 mmol/L — ABNORMAL LOW (ref 22–32)
Calcium: 8.3 mg/dL — ABNORMAL LOW (ref 8.9–10.3)
Chloride: 108 mmol/L (ref 98–111)
Creatinine, Ser: 1.59 mg/dL — ABNORMAL HIGH (ref 0.44–1.00)
GFR, Estimated: 34 mL/min — ABNORMAL LOW (ref >60)
Glucose, Bld: 101 mg/dL — ABNORMAL HIGH (ref 70–99)
Potassium: 5.2 mmol/L — ABNORMAL HIGH (ref 3.5–5.1)
Sodium: 136 mmol/L (ref 135–145)

## 2020-12-07 LAB — CBC
HCT: 30.9 % — ABNORMAL LOW (ref 36.0–46.0)
Hemoglobin: 10.9 g/dL — ABNORMAL LOW (ref 12.0–15.0)
MCH: 30.3 pg (ref 26.0–34.0)
MCHC: 35.3 g/dL (ref 30.0–36.0)
MCV: 85.8 fL (ref 80.0–100.0)
Platelets: 161 10*3/uL (ref 150–400)
RBC: 3.6 MIL/uL — ABNORMAL LOW (ref 3.87–5.11)
RDW: 17.2 % — ABNORMAL HIGH (ref 11.5–15.5)
WBC: 8.1 10*3/uL (ref 4.0–10.5)
nRBC: 0 % (ref 0.0–0.2)

## 2020-12-07 LAB — GLUCOSE, CAPILLARY
Glucose-Capillary: 112 mg/dL — ABNORMAL HIGH (ref 70–99)
Glucose-Capillary: 142 mg/dL — ABNORMAL HIGH (ref 70–99)
Glucose-Capillary: 150 mg/dL — ABNORMAL HIGH (ref 70–99)
Glucose-Capillary: 151 mg/dL — ABNORMAL HIGH (ref 70–99)
Glucose-Capillary: 154 mg/dL — ABNORMAL HIGH (ref 70–99)
Glucose-Capillary: 69 mg/dL — ABNORMAL LOW (ref 70–99)
Glucose-Capillary: 87 mg/dL (ref 70–99)
Glucose-Capillary: 89 mg/dL (ref 70–99)
Glucose-Capillary: 95 mg/dL (ref 70–99)

## 2020-12-07 MED ORDER — SODIUM CHLORIDE 0.9% FLUSH
3.0000 mL | Freq: Two times a day (BID) | INTRAVENOUS | Status: DC
  Administered 2020-12-07 – 2020-12-08 (×3): 3 mL via INTRAVENOUS

## 2020-12-07 MED ORDER — FUROSEMIDE 40 MG PO TABS: 40.0000 mg | ORAL_TABLET | Freq: Every day | ORAL | Status: DC

## 2020-12-07 MED ORDER — ~~LOC~~ CARDIAC SURGERY, PATIENT & FAMILY EDUCATION: Freq: Once | Status: DC

## 2020-12-07 MED ORDER — PANTOPRAZOLE SODIUM 40 MG PO TBEC
40.0000 mg | DELAYED_RELEASE_TABLET | Freq: Every day | ORAL | Status: DC
  Administered 2020-12-07 – 2020-12-09 (×3): 40 mg via ORAL
  Filled 2020-12-07 (×3): qty 1

## 2020-12-07 MED ORDER — DOCUSATE SODIUM 100 MG PO CAPS
200.0000 mg | ORAL_CAPSULE | Freq: Every day | ORAL | Status: DC
  Administered 2020-12-07 – 2020-12-09 (×2): 200 mg via ORAL
  Filled 2020-12-07 (×3): qty 2

## 2020-12-07 MED ORDER — ROSUVASTATIN CALCIUM 20 MG PO TABS
40.0000 mg | ORAL_TABLET | Freq: Every day | ORAL | Status: DC
  Administered 2020-12-07 – 2020-12-09 (×3): 40 mg via ORAL
  Filled 2020-12-07 (×3): qty 2

## 2020-12-07 MED ORDER — OXYCODONE HCL 5 MG PO TABS
5.0000 mg | ORAL_TABLET | ORAL | Status: DC | PRN
  Administered 2020-12-07 – 2020-12-08 (×2): 5 mg via ORAL
  Filled 2020-12-07 (×2): qty 1

## 2020-12-07 MED ORDER — FUROSEMIDE 10 MG/ML IJ SOLN
20.0000 mg | Freq: Once | INTRAMUSCULAR | Status: AC
  Administered 2020-12-07: 20 mg via INTRAVENOUS
  Filled 2020-12-07: qty 2

## 2020-12-07 MED ORDER — DIPHENHYDRAMINE HCL 25 MG PO CAPS
25.0000 mg | ORAL_CAPSULE | Freq: Four times a day (QID) | ORAL | Status: DC | PRN
  Administered 2020-12-07 – 2020-12-08 (×2): 25 mg via ORAL
  Filled 2020-12-07 (×2): qty 1

## 2020-12-07 MED ORDER — ORAL CARE MOUTH RINSE
15.0000 mL | Freq: Four times a day (QID) | OROMUCOSAL | Status: DC
  Administered 2020-12-08: 15 mL via OROMUCOSAL

## 2020-12-07 MED ORDER — SODIUM CHLORIDE 0.9% FLUSH: 3.0000 mL | INTRAVENOUS | Status: DC | PRN

## 2020-12-07 MED ORDER — TRAMADOL HCL 50 MG PO TABS: 50.0000 mg | ORAL_TABLET | ORAL | Status: DC | PRN

## 2020-12-07 MED ORDER — SODIUM CHLORIDE 0.9 % IV SOLN: 250.0000 mL | INTRAVENOUS | Status: DC | PRN

## 2020-12-07 NOTE — Progress Notes (Signed)
      [address]       [place] [address]             [phone]                 2 Days Post-Op Procedure(s) (LRB): CORONARY ARTERY BYPASS GRAFTING (CABG), ON PUMP, TIMES FOUR, USING LEFT INTERNAL MAMMARY ARTERY AND BILATERAL ENDOSCOPICALLY HARVESTED GREATER SAPHENOUS VEINS (N/A) TRANSESOPHAGEAL ECHOCARDIOGRAM (TEE) (N/A)   Events: No events _______________________________________________________________ Vitals: BP (!) 115/56   Pulse 73   Temp 98.5 F (36.9 C) (Oral)   Resp (!) 26   Ht 4\' 11"  (1.499 m)   Wt 49.4 kg   SpO2 96%   BMI 22.00 kg/m   - Neuro: alert NAD  - Cardiovascular: sinus.  Drips: none.   CVP:  [9 mmHg-11 mmHg] 11 mmHg  - Pulm: EWOB, SS CT output Vent Mode: PSV;CPAP FiO2 (%):  [40 %] 40 % Set Rate:  [4 bmp] 4 bmp PEEP:  [5 cmH20] 5 cmH20 Pressure Support:  [10 cmH20] 10 cmH20  ABG    Component Value Date/Time   PHART 7.404 12/06/2020 1002   PCO2ART 28.9 (L) 12/06/2020 1002   PO2ART 95 12/06/2020 1002   HCO3 18.1 (L) 12/06/2020 1002   TCO2 19 (L) 12/06/2020 1002   ACIDBASEDEF 6.0 (H) 12/06/2020 1002   O2SAT 98.0 12/06/2020 1002    - Abd: soft - Extremity: warm  .Intake/Output      02/15 0701 02/16 0700 02/16 0701 02/17 0700   P.O. 650    I.V. (mL/kg) 410.3 (8.3)    Blood     NG/GT 60    IV Piggyback     Total Intake(mL/kg) 1120.3 (22.7)    Urine (mL/kg/hr) 620 (0.5)    Emesis/NG output 60    Other 190    Blood     Chest Tube 10    Total Output 880    Net +240.3            _______________________________________________________________ Labs: CBC Latest Ref Rng & Units 12/07/2020 12/06/2020 12/06/2020  WBC 4.0 - 10.5 K/uL 8.1 7.5 -  Hemoglobin 12.0 - 15.0 g/dL 10.9(L) 11.1(L) 9.2(L)  Hematocrit 36.0 - 46.0 % 30.9(L) 30.6(L) 27.0(L)  Platelets 150 - 400 K/uL 161 166 -   CMP Latest Ref Rng & Units 12/07/2020 12/06/2020 12/06/2020  Glucose 70 - 99 mg/dL 101(H) 87 -  BUN 8 - 23 mg/dL 26(H) 26(H) -   Creatinine 0.44 - 1.00 mg/dL 1.59(H) 1.45(H) -  Sodium 135 - 145 mmol/L 136 136 141  Potassium 3.5 - 5.1 mmol/L 5.2(H) 4.1 4.4  Chloride 98 - 111 mmol/L 108 109 -  CO2 22 - 32 mmol/L 20(L) 19(L) -  Calcium 8.9 - 10.3 mg/dL 8.3(L) 8.2(L) -  Total Protein 6.5 - 8.1 g/dL - - -  Total Bilirubin 0.3 - 1.2 mg/dL - - -  Alkaline Phos 38 - 126 U/L - - -  AST 15 - 41 U/L - - -  ALT 0 - 44 U/L - - -    CXR: Clear, PV congestion  _______________________________________________________________  Assessment and Plan: POD 2 s/p CABG 4  Neuro: pain controlled CV: off all gtts.  On asp/statin/BB.  Will remove CVL Pulm: pulm toilet.  Will remove CTs Renal: will diurese today GI: on diet Heme: stable ID: afebrile Endo: SSI Dispo: floor today   Seaborn Nakama O Raffaella [place] 12/07/2020 9:06 AM

## 2020-12-07 NOTE — Progress Notes (Signed)
CARDIAC REHAB PHASE I   PRE:  Rate/Rhythm: 79 SR  BP:  Supine: 110/69     SaO2: 94 RA  MODE:  Ambulation: 190 ft   POST:  Rate/Rhythm: 83 SR  BP:  Supine: 159/61     SaO2: 94 RA  Pt did well with moderate assistance getting into and out of bed. Pt ambulated with gait belt, EVA Reinaldo Helt and standby assistance. Pt tolerated exercise well. Pt needed to use the [facility] upon return, then returned to bed with call bell in reach. Family in the room.  [phone] Kirby Funk ACSM-EP 12/07/2020 1:40 PM

## 2020-12-07 NOTE — Progress Notes (Signed)
Inpatient Diabetes Program Recommendations  AACE/ADA: New Consensus Statement on Inpatient Glycemic Control (2015)  Target Ranges:  Prepandial:   less than 140 mg/dL      Peak postprandial:   less than 180 mg/dL (1-2 hours)      Critically ill patients:  140 - 180 mg/dL   Lab Results  Component Value Date   GLUCAP 89 12/07/2020   HGBA1C 11.8 (H) 12/03/2020    Review of Glycemic Control Results for Mariah Peterson, Mariah Peterson (MRN [id number]) as of 12/07/2020 10:03  Ref. Range 12/07/2020 00:25 12/07/2020 01:08 12/07/2020 04:07 12/07/2020 06:57  Glucose-Capillary Latest Ref Range: 70 - 99 mg/dL 69 (L) 95 87 89   Diabetes history: DM 2 Outpatient Diabetes medications: Lantus 18 units, Novolog 12 units breakfast and lunch, Ozempic 0.5 mg QFriday, Actos Daily Current orders for Inpatient glycemic control:  Lantus 10 units Daily, Novolog 0-24 units Q4H  Inpatient Diabetes Program Recommendations:    Noted mild hypoglycemia of 69 mg/dL. May want to slightly decrease Lantus to 8 units QD.   Thanks, Bronson Curb, MSN, RNC-OB Diabetes Coordinator [phone] (8a-5p)

## 2020-12-08 DIAGNOSIS — E440 Moderate protein-calorie malnutrition: Secondary | ICD-10-CM | POA: Insufficient documentation

## 2020-12-08 LAB — GLUCOSE, CAPILLARY
Glucose-Capillary: 55 mg/dL — ABNORMAL LOW (ref 70–99)
Glucose-Capillary: 62 mg/dL — ABNORMAL LOW (ref 70–99)
Glucose-Capillary: 136 mg/dL — ABNORMAL HIGH (ref 70–99)
Glucose-Capillary: 134 mg/dL — ABNORMAL HIGH (ref 70–99)
Glucose-Capillary: 141 mg/dL — ABNORMAL HIGH (ref 70–99)
Glucose-Capillary: 94 mg/dL (ref 70–99)
Glucose-Capillary: 129 mg/dL — ABNORMAL HIGH (ref 70–99)

## 2020-12-08 LAB — TYPE AND SCREEN
ABO/RH(D): B POS
Antibody Screen: NEGATIVE
Unit division: 0
Unit division: 0
Unit division: 0
Unit division: 0
Unit division: 0
Unit division: 0

## 2020-12-08 LAB — CBC
HCT: 32.2 % — ABNORMAL LOW (ref 36.0–46.0)
Hemoglobin: 11.1 g/dL — ABNORMAL LOW (ref 12.0–15.0)
MCH: 29.8 pg (ref 26.0–34.0)
MCHC: 34.5 g/dL (ref 30.0–36.0)
MCV: 86.6 fL (ref 80.0–100.0)
Platelets: 170 10*3/uL (ref 150–400)
RBC: 3.72 MIL/uL — ABNORMAL LOW (ref 3.87–5.11)
RDW: 17 % — ABNORMAL HIGH (ref 11.5–15.5)
WBC: 9 10*3/uL (ref 4.0–10.5)
nRBC: 0 % (ref 0.0–0.2)

## 2020-12-08 LAB — BPAM RBC
Blood Product Expiration Date: 202202182359
Blood Product Expiration Date: 202202212359
Blood Product Expiration Date: 202202212359
Blood Product Expiration Date: 202202212359
Blood Product Expiration Date: 202202252359
Blood Product Expiration Date: 202203012359
ISSUE DATE / TIME: 202202140629
ISSUE DATE / TIME: 202202140629
ISSUE DATE / TIME: 202202141022
ISSUE DATE / TIME: 202202141022
ISSUE DATE / TIME: 202202141222
ISSUE DATE / TIME: 202202141222
Unit Type and Rh: 7300
Unit Type and Rh: 7300
Unit Type and Rh: 7300
Unit Type and Rh: 7300
Unit Type and Rh: 7300
Unit Type and Rh: 7300

## 2020-12-08 LAB — BASIC METABOLIC PANEL
Anion gap: 8 (ref 5–15)
BUN: 30 mg/dL — ABNORMAL HIGH (ref 8–23)
CO2: 21 mmol/L — ABNORMAL LOW (ref 22–32)
Calcium: 8.3 mg/dL — ABNORMAL LOW (ref 8.9–10.3)
Chloride: 105 mmol/L (ref 98–111)
Creatinine, Ser: 1.78 mg/dL — ABNORMAL HIGH (ref 0.44–1.00)
GFR, Estimated: 30 mL/min — ABNORMAL LOW (ref >60)
Glucose, Bld: 78 mg/dL (ref 70–99)
Potassium: 4.2 mmol/L (ref 3.5–5.1)
Sodium: 134 mmol/L — ABNORMAL LOW (ref 135–145)

## 2020-12-08 MED ORDER — FUROSEMIDE 40 MG PO TABS
40.0000 mg | ORAL_TABLET | Freq: Every day | ORAL | Status: DC
  Administered 2020-12-09: 40 mg via ORAL
  Filled 2020-12-08: qty 1

## 2020-12-08 MED ORDER — GLUCERNA SHAKE PO LIQD
237.0000 mL | Freq: Three times a day (TID) | ORAL | Status: DC
  Administered 2020-12-08 – 2020-12-09 (×2): 237 mL via ORAL

## 2020-12-08 MED ORDER — INSULIN DETEMIR 100 UNIT/ML ~~LOC~~ SOLN
6.0000 [IU] | Freq: Every day | SUBCUTANEOUS | Status: DC
  Administered 2020-12-08 – 2020-12-09 (×2): 6 [IU] via SUBCUTANEOUS
  Filled 2020-12-08 (×2): qty 0.06

## 2020-12-08 MED FILL — Heparin Sodium (Porcine) Inj 1000 Unit/ML: INTRAMUSCULAR | Qty: 10 | Status: AC

## 2020-12-08 MED FILL — Sodium Chloride IV Soln 0.9%: INTRAVENOUS | Qty: 2000 | Status: AC

## 2020-12-08 MED FILL — Electrolyte-R (PH 7.4) Solution: INTRAVENOUS | Qty: 6000 | Status: AC

## 2020-12-08 MED FILL — Albumin, Human Inj 5%: INTRAVENOUS | Qty: 250 | Status: AC

## 2020-12-08 MED FILL — Calcium Chloride Inj 10%: INTRAVENOUS | Qty: 10 | Status: AC

## 2020-12-08 MED FILL — Sodium Bicarbonate IV Soln 8.4%: INTRAVENOUS | Qty: 100 | Status: AC

## 2020-12-08 MED FILL — Mannitol IV Soln 20%: INTRAVENOUS | Qty: 500 | Status: AC

## 2020-12-08 NOTE — Care Management Important Message (Signed)
Important Message  Patient Details  Name: Mariah Peterson MRN: [id number] Date of Birth: [birth year]   Medicare Important Message Given:  Yes     Shelda Altes 12/08/2020, 10:49 AM

## 2020-12-08 NOTE — Progress Notes (Signed)
Mobility Specialist: Progress Note   12/08/20 1245  Mobility  Activity Ambulated in hall  Level of Assistance Modified independent, requires aide device or extra time  Assistive Device Front wheel walker  Distance Ambulated (ft) 384 ft  Mobility Response Tolerated well  Mobility performed by Mobility specialist  Bed Position Semi-fowlers  $Mobility charge 1 Mobility   Pre-Mobility: 88 HR During Mobility: 90 HR Post-Mobility: 86 HR, 150/70 BP  Pt asx during ambulation. Pt required verbal cues for spatial awareness with RW during walk. Pt back to bed per request.   Harrell Gave Doren Kaspar Mobility Specialist Mobility Specialist Phone: [phone]

## 2020-12-08 NOTE — Progress Notes (Signed)
Nutrition Follow Up  DOCUMENTATION CODES:   Underweight,Non-severe (moderate) malnutrition in context of chronic illness  INTERVENTION:    Glucerna Shake po TID, each supplement provides 220 kcal and 10 grams of protein  MVI with minerals daily  NUTRITION DIAGNOSIS:   Moderate Malnutrition related to chronic illness (uncontrolled DM) as evidenced by mild fat depletion,moderate muscle depletion.  Ongoing  GOAL:   Patient will meet greater than or equal to 90% of their needs   Progressing   MONITOR:   PO intake,Labs,Weight trends,Supplement acceptance,I & O's  REASON FOR ASSESSMENT:   Consult Assessment of nutrition requirement/status  ASSESSMENT:   75 yo female admitted with non-STEMI. PMH includes DM-2, HTN, HLD, glaucoma.   2/14- CABG x4 2/15- extubated   Patient reports great appetite post-op. Last four meal completions charted as 75-100%. Taking at least two Glucernas daily. PTA patient denies decreased appetite. Reports a UBW of 115 lb and denies weight loss.   Admission weight: 87.7 kg  Current weight: 49.4 kg   Medications: dulcolax, colace, 40 mg lasix daily, SS novolog, levemir Labs: Na 134 (L) CBG 55-150  NUTRITION - FOCUSED PHYSICAL EXAM:  Flowsheet Row Most Recent Value  Orbital Region Mild depletion  Upper Arm Region Mild depletion  Thoracic and Lumbar Region Unable to assess  Buccal Region Mild depletion  Temple Region Mild depletion  Clavicle Bone Region Moderate depletion  Clavicle and Acromion Bone Region Moderate depletion  Scapular Bone Region Unable to assess  Dorsal Hand Mild depletion  Patellar Region Unable to assess  Anterior Thigh Region Unable to assess  Posterior Calf Region Unable to assess  Edema (RD Assessment) Unable to assess  Hair Reviewed  Eyes Reviewed  Mouth Reviewed  Skin Reviewed  Nails Reviewed     Diet Order:   Diet Order            Diet heart healthy/carb modified Room service appropriate? Yes with  Assist; Fluid consistency: Thin  Diet effective now                 EDUCATION NEEDS:   Not appropriate for education at this time  Skin:  Skin Assessment: Skin Integrity Issues: Skin Integrity Issues:: Incisions Incisions: back, leg  Last BM:  2/16  Height:   Ht Readings from Last 1 Encounters:  12/05/20 4\' 11"  (1.499 m)    Weight:   Wt Readings from Last 1 Encounters:  12/08/20 49.4 kg    Ideal Body Weight:  44.7 kg  BMI:  Body mass index is 22 kg/m.  Estimated Nutritional Needs:   Kcal:  1250-1450  Protein:  60-70 gm  Fluid:  1.5 L  Mariana Single RD, LDN Clinical Nutrition Pager listed in [place]

## 2020-12-08 NOTE — Progress Notes (Signed)
Patient ambulated from bed to restroom & back to bed with just standby assist from RN

## 2020-12-08 NOTE — Progress Notes (Addendum)
      [address]       [place] [address]             [phone]        3 Days Post-Op Procedure(s) (LRB): CORONARY ARTERY BYPASS GRAFTING (CABG), ON PUMP, TIMES FOUR, USING LEFT INTERNAL MAMMARY ARTERY AND BILATERAL ENDOSCOPICALLY HARVESTED GREATER SAPHENOUS VEINS (N/A) TRANSESOPHAGEAL ECHOCARDIOGRAM (TEE) (N/A)  Subjective: Patient has some incisional pain.  Objective: Vital signs in last 24 hours: Temp:  [98.2 F (36.8 C)-98.8 F (37.1 C)] 98.7 F (37.1 C) (02/17 0343) Pulse Rate:  [73-82] 74 (02/17 0343) Cardiac Rhythm: Normal sinus rhythm (02/16 1959) Resp:  [20-37] 20 (02/17 0343) BP: (113-146)/(55-71) 114/55 (02/17 0343) SpO2:  [95 %-99 %] 97 % (02/17 0343) Weight:  [49.4 kg] 49.4 kg (02/17 0343)  Pre op weight 42.6 kg Current Weight  12/08/20 49.4 kg      Intake/Output from previous day: 02/16 0701 - 02/17 0700 In: -  Out: 8 [Urine:351]   Physical Exam:  Cardiovascular: RRR Pulmonary: Diminished bibasilar breath sounds. Abdomen: Soft, non tender, bowel sounds present. Extremities: Trace bilateral lower extremity edema. Wounds: Aquacel removed and wound is clean and dry.  No erythema or signs of infection. RLE wound is clean and dry  Lab Results: CBC: Recent Labs    12/07/20 0516 12/08/20 0101  WBC 8.1 9.0  HGB 10.9* 11.1*  HCT 30.9* 32.2*  PLT 161 170   BMET:  Recent Labs    12/07/20 0516 12/08/20 0101  NA 136 134*  K 5.2* 4.2  CL 108 105  CO2 20* 21*  GLUCOSE 101* 78  BUN 26* 30*  CREATININE 1.59* 1.78*  CALCIUM 8.3* 8.3*    PT/INR:  Lab Results  Component Value Date   INR 1.5 (H) 12/05/2020   INR 1.1 12/03/2020   ABG:  INR: Will add last result for INR, ABG once components are confirmed Will add last 4 CBG results once components are confirmed  Assessment/Plan:  1. CV - SR with HR in the 80's. On Lopressor 12.5 mg bid 2.  Pulmonary - On room air. Check PA/LAT CXR in am. Encourage incentive  spirometer 3. Volume Overload - On Lasix 40 mg daily. Will hold this am as elevated creatinine and discuss with Dr. Kipp Brood. 4.  Expected post op acute blood loss anemia - H and H this am stable at 11.1 and 32.2 5. Creatinine increased from 1..59 to 1.78. Creatinine upon admission was 1.55.  Chronic Kidney Disease   Stage I     GFR >90  Stage II    GFR 60-89  Stage IIIA GFR 45-59  Stage IIIB GFR 30-44  Stage IV   GFR 15-29  Stage V    GFR  <15  Lab Results  Component Value Date   CREATININE 1.78 (H) 12/08/2020   Estimated Creatinine Clearance: 19.2 mL/min (A) (by C-G formula based on SCr of 1.78 mg/dL (H)).  6. DM-CBGs 55/136/134. On Insulin. Will decrease to avoid further hypoglycemia. Pre op HGA1C 11.8. She will need very close medical follow up after discharge 7. Discharge 1-2 days  Sharalyn Ink [facility] 12/08/2020,7:08 AM   Overall doing well. Slight bump in her creatinine.  Lasix being held We will plan for discharge tomorrow if creatinine stable.  Abbygayle Helfand Bary Leriche

## 2020-12-08 NOTE — Progress Notes (Signed)
CARDIAC REHAB PHASE I   PRE:  Rate/Rhythm: 89 SR  BP:  Sitting: 138/68      SaO2: 97 RA  MODE:  Ambulation: 230 ft   POST:  Rate/Rhythm: 95 SR  BP:  Sitting: 147/66    SaO2: 96 RA   Pt ambulated 264ft in hallway standby assist with front wheel walker. Pt denies pain, SOB, or dizziness. Pt returned to recliner. Encouraged continued IS use and ambulation. Pt requesting walker and 3-in-1 for home use, CM and RN made aware. Will continue to follow.  [id number] Rufina Falco, RN BSN 12/08/2020 10:05 AM

## 2020-12-08 NOTE — Progress Notes (Signed)
Hypoglycemic Event  CBG: 62 at 0348  Treatment: 4 oz juice/soda  Symptoms: None  Follow-up CBG: Time: 0417 CBG Result: 55  Possible Reasons for Event: Unknown  Patient given 25 ml of Dextrose 50%  CBG recheck at 0445 is 136  RN will continue monitor patient  Lennox Laity  RN

## 2020-12-09 ENCOUNTER — Ambulatory Visit: Payer: Medicare Other | Admitting: Endocrinology

## 2020-12-09 ENCOUNTER — Inpatient Hospital Stay (HOSPITAL_COMMUNITY): Payer: Medicare Other

## 2020-12-09 ENCOUNTER — Other Ambulatory Visit: Payer: Self-pay | Admitting: Physician Assistant

## 2020-12-09 LAB — BASIC METABOLIC PANEL
Anion gap: 6 (ref 5–15)
BUN: 32 mg/dL — ABNORMAL HIGH (ref 8–23)
CO2: 22 mmol/L (ref 22–32)
Calcium: 8.2 mg/dL — ABNORMAL LOW (ref 8.9–10.3)
Chloride: 108 mmol/L (ref 98–111)
Creatinine, Ser: 1.71 mg/dL — ABNORMAL HIGH (ref 0.44–1.00)
GFR, Estimated: 31 mL/min — ABNORMAL LOW (ref >60)
Glucose, Bld: 113 mg/dL — ABNORMAL HIGH (ref 70–99)
Potassium: 4.7 mmol/L (ref 3.5–5.1)
Sodium: 136 mmol/L (ref 135–145)

## 2020-12-09 LAB — GLUCOSE, CAPILLARY
Glucose-Capillary: 74 mg/dL (ref 70–99)
Glucose-Capillary: 105 mg/dL — ABNORMAL HIGH (ref 70–99)

## 2020-12-09 MED ORDER — METOPROLOL TARTRATE 25 MG PO TABS: 12.5000 mg | ORAL_TABLET | Freq: Two times a day (BID) | ORAL | 3 refills | Status: DC

## 2020-12-09 MED ORDER — ASPIRIN 325 MG PO TBEC: 325.0000 mg | DELAYED_RELEASE_TABLET | Freq: Every day | ORAL | 0 refills | Status: DC

## 2020-12-09 MED ORDER — OXYCODONE HCL 5 MG PO TABS: 5.0000 mg | ORAL_TABLET | ORAL | 0 refills | Status: DC | PRN

## 2020-12-09 MED FILL — oxyCODONE HCL 5 MG TABS: 5 | 5 days supply | Qty: 30 | Fill #0

## 2020-12-09 MED FILL — METOPROLOL TARTRATE 25 MG T: 25 | 30 days supply | Qty: 30 | Fill #0

## 2020-12-09 NOTE — Discharge Summary (Signed)
[address]       [place] [address]             [phone]    Physician Discharge Summary  Patient ID: Mariah Peterson MRN: [id number] DOB/AGE: 74-Mar-1947 74 y.o.  Admit date: 11/29/2020 Discharge date: 12/09/2020  Admission Diagnoses:  Patient Active Problem List   Diagnosis Date Noted  . Malnutrition of moderate degree 12/08/2020  . Chest pain 11/29/2020  . NSTEMI (non-ST elevated myocardial infarction) ([place])   . Glaucoma 03/31/2019  . Healthcare maintenance 12/03/2018  . Anemia 12/03/2018  . Hypertension 10/31/2018  . Hyperlipidemia 08/04/2013  . Diabetes mellitus ([place]) 08/04/2013   Discharge Diagnoses:  Patient Active Problem List   Diagnosis Date Noted  . Malnutrition of moderate degree 12/08/2020  . 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 Chest pain 11/29/2020  . NSTEMI (non-ST elevated myocardial infarction) ([place])   . Glaucoma 03/31/2019  . Healthcare maintenance 12/03/2018  . Anemia 12/03/2018  . Hypertension 10/31/2018  . Hyperlipidemia 08/04/2013  . Diabetes mellitus ([place]) 08/04/2013   Discharged Condition: good  HPI:   Mariah Peterson is a 75 woman with a past history of type II insulin dependent diabetes, hypertension, hyperlipidemia and glaucoma. She has no prior history of CAD but does have a family history.   She was in her usual state of health until 11/29/20. She developed sudden onset of severe substernal chest tightness with shortness of breath. No radiation. She thought it was indigestion initially but the symptoms persisted and she came to ED. She ruled in for MI with serial enzymes.    Has been pain free since admission.   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 EF was normal and no valvular pathology by echo.  Hospital Course:   On 12/05/2020 Mariah Peterson underwent a coronary artery bypass grafting x 4 with Dr. Kipp Brood. She tolerated the procedure well and was  transferred to the surgical ICU for continued care. She was extubated the following morning around noon. Her chest tubes were downsized to bulb suction and her EPW were discontinued. We started midodrine since she was a little hypotensive but her hemodynamics were good. She continued to progress.  Her chest tubes were removed on 12/07/2020.  She was medically stable and transferred to the progressive care unit as well. Patient remained in sinus rhythm. Her creatinine increased from 1.59 to 1.78. Of note, her creatinine upon admission was 1.55. Lasix was held. PA/LAT CXR was done 02/18 and showed mild bilateral atelectasis/effusions.  Repeat creatinine level remained stable at 1.71.  Her surgical incisions are healing without evidence of infection.  She is ambulating without significant difficulty.  She is felt medically stable for discharge home today.  Significant Diagnostic Studies: angiography:    Widely patent left main  Heavily calcified left main, and LAD.  LAD contains significant tortuosity.  In the distal third there is 50% narrowing followed by an area of ectasia and then 70% narrowing near the apex.  Beyond this 3 moderate-sized diagonals are arise.  Circumflex gives origin to 3 obtuse marginals.  The first is moderate in size and contains a region of angulation in which there is a 90% stenosis.  A large ramus intermedius is tortuous and contains proximal 95% stenosis.  The stenosis is within an acute bend.  Right coronary is diffusely diseased proximally with 90% stenosis.  The vessel is small in diameter.  PDA is  totally occluded and fills by right to right and left-to-right collaterals.  Normal LV function.  Numerous left ventricular AV fistula a cause left ventricular filling during left coronary injection.  Treatments: surgery:   Procedure: CABG X 4, LIMA to LAD, reverse saphenous vein graft to PDA, OM1, and ramus intermedius  greater saphenous vein harvest on the right and left  thigh  Discharge Exam: Blood pressure (!) 146/71, pulse (!) 101, temperature 99.8 F (37.7 C), temperature source Oral, resp. rate 18, height 4\' 11"  (1.499 m), weight 47.1 kg, SpO2 98 %.  General appearance: alert, cooperative and no distress Heart: regular rate and rhythm Lungs: clear to auscultation bilaterally Abdomen: soft, non-tender; bowel sounds normal; no masses,  no organomegaly Extremities: edema trace Wound: clean and dry, sutures in place on RLE clean and dry  Discharge Medications:  The patient has been discharged on:   1.Beta Blocker:  Yes [ X  ]                              No   [   ]                              If No, reason:  2.Ace Inhibitor/ARB: Yes [   ]                                     No  [  X  ]                                     If No, reason: CKD 3.Statin:   Yes [ x  ]                  No  [   ]                  If No, reason:  4.Ecasa:  Yes  [ x  ]                  No   [   ]                  If No, reason:   Allergies as of 12/09/2020   No Known Allergies     Medication List    STOP taking these medications   amLODipine 10 MG tablet Commonly known as: NORVASC   azithromycin 250 MG tablet Commonly known as: ZITHROMAX   hydrochlorothiazide 12.5 MG capsule Commonly known as: MICROZIDE   pioglitazone 30 MG tablet Commonly known as: Actos     TAKE these medications   aspirin 325 MG EC tablet Take 1 tablet (325 mg total) by mouth daily.   benzonatate 100 MG capsule Commonly known as: TESSALON Take 1 capsule (100 mg total) by mouth 3 (three) times daily as needed. What changed: reasons to take this   calcium carbonate 1500 (600 Ca) MG Tabs tablet Commonly known as: OSCAL Take 1 tablet by mouth 2 (two) times daily with a meal.   FreeStyle Libre 14 Day Sensor Misc Inject 1 each into the skin every 14 (fourteen) days.   glucose blood test strip Use as instructed to test blood sugar 3 times daily  E11.65   Lantus SoloStar 100  UNIT/ML Solostar Pen Generic drug: insulin glargine INJECT 16 UNITS UNDER THE SKIN ONCE DAILY. (UPDATED DOSAGE) What changed:   how much to take  how to take this  when to take this  additional instructions   metoprolol tartrate 25 MG tablet Commonly known as: LOPRESSOR Take 0.5 tablets (12.5 mg total) by mouth 2 (two) times daily.   NovoLOG FlexPen 100 UNIT/ML FlexPen Generic drug: insulin aspart INJECT 8 UNITS UNDER THE SKIN BEFORE BREAKFAST AND 6 UNITS BEFORE DINNER. What changed: See the new instructions.   oxyCODONE 5 MG immediate release tablet Commonly known as: Oxy IR/ROXICODONE Take 1 tablet (5 mg total) by mouth every 4 (four) hours as needed for severe pain.   Ozempic (0.25 or 0.5 MG/DOSE) 2 MG/1.5ML Sopn Generic drug: Semaglutide(0.25 or 0.5MG /DOS) Inject 0.5 mg into the skin once a week.   rosuvastatin 40 MG tablet Commonly known as: CRESTOR TAKE 1 TABLET EVERY DAY   Travoprost (BAK Free) 0.004 % Soln ophthalmic solution Commonly known as: TRAVATAN Place 1 drop into both eyes at bedtime.            Durable Medical Equipment  (From admission, onward)         Start     Ordered   12/08/20 1308  For home use only DME Walker rolling  Once       Question Answer Comment  Walker: With 5 Inch Wheels   Patient needs a walker to treat with the following condition Weakness      12/08/20 1307   12/08/20 1307  For home use only DME 3 n 1  Once        12/08/20 1307          Follow-up Information    [name], MD. Go on 12/16/2020.   Specialty: Cardiothoracic Surgery Why: Appointment time is at 10:45 am Contact information: [address] [place] [address] [phone]        Deberah Pelton, NP. Go on 12/23/2020.   Specialty: Cardiology Why: Appointment time is at 3:15 pm Contact information: [address] [place] [address] [phone]               Signed: Ellwood Handler, PA-C 12/09/2020,  8:15 AM

## 2020-12-09 NOTE — Progress Notes (Signed)
D/c instructions given to patient and husband. Wound care and medications reviewed. All questions answered. IV removed, clean and intact. Husband to escort pt home with 3n1. Walker to be delivered to house.   Clyde Canterbury, RN

## 2020-12-09 NOTE — Progress Notes (Signed)
CARDIAC REHAB PHASE I   D/c education completed with pt and family. Pt educated on importance of site care and monitoring incisions daily. Encouraged continued IS use, walks, and sternal precautions. Pt given in-the-tube sheet along with heart healthy and diabetic diets. Reviewed restrictions and exercise guidelines. 3-in-1 at bedside, walker to be delivered to the house. Pt denies further questions or concerns. Will refer to CRP II [place].   [id number] Rufina Falco, RN BSN 12/09/2020 10:36 AM

## 2020-12-09 NOTE — Discharge Instructions (Signed)
[facility] office [phone]   Coronary Artery Bypass Grafting, Care After This sheet gives you information about how to care for yourself after your procedure. Your doctor may also give you more specific instructions. If you have problems or questions, call your doctor. What can I expect after the procedure? After the procedure, it is common to:  Feel sick to your stomach (nauseous).  Not want to eat as much as normal (lack of appetite).  Have trouble pooping (constipation).  Have weakness and tiredness (fatigue).  Feel sad (depressed) or grouchy (irritable).  Have pain or discomfort around the cuts from surgery (incisions). Follow these instructions at home: Medicines  Take over-the-counter and prescription medicines only as told by your doctor. Do not stop taking medicines or start any new medicines unless your doctor says it is okay.  If you were prescribed an antibiotic medicine, take it as told by your doctor. Do not stop taking the antibiotic even if you start to feel better. Incision care  Follow instructions from your doctor about how to take care of your cuts from surgery. Make sure you: ? Wash your hands with soap and water before and after you change your bandage (dressing). If you cannot use soap and water, use hand sanitizer. ? Change your bandage as told by your doctor. ? Leave stitches (sutures), skin glue, or skin tape (adhesive) strips in place. They may need to stay in place for 2 weeks or longer. If tape strips get loose and curl up, you may trim the loose edges. Do not remove tape strips completely unless your doctor says it is okay.  Make sure the surgery cuts are clean, dry, and protected.  Check your cut areas every day for signs of infection. Check for: ? More redness, swelling, or pain. ? More fluid or blood. ? Warmth. ? Pus or a bad smell.  If cuts were made in your legs: ? Avoid crossing your legs. ? Avoid sitting for long periods of time. Change  positions every 30 minutes. ? Raise (elevate) your legs when you are sitting.   Bathing  You may shower, clean icnisions gently with soap and water and pat dry gently. Pat the surgery cuts dry. Do not rub the cuts to dry.  Ask your doctor when you can shower. Eating and drinking  Eat foods that are high in fiber, such as beans, nuts, whole grains, and raw fruits and vegetables. Any meats you eat should be lean cut. Avoid canned, processed, and fried foods. This can help prevent trouble pooping. This is also a part of a heart-healthy diet.  Drink enough fluid to keep your pee (urine) pale yellow.  Do not drink alcohol until you are fully recovered. Ask your doctor when it is safe to drink alcohol.   Activity  Rest and limit your activity as told by your doctor. You may be told to: ? Stop any activity right away if you have chest pain, shortness of breath, irregular heartbeats, or dizziness. Get help right away if you have any of these symptoms. ? Move around often for short periods or take short walks as told by your doctor. Slowly increase your activities. ? Avoid lifting, pushing, or pulling anything that is heavier than 10 lb (4.5 kg) for at least 6 weeks or as told by your doctor.  Do physical therapy or a cardiac rehab (cardiac rehabilitation) program as told by your doctor. ? Physical therapy involves doing exercises to maintain movement and build strength and endurance. ?  A cardiac rehab program includes:  Exercise training.  Education.  Counseling.  Do not drive until your doctor says it is okay.  Ask your doctor when you can go back to work.  Ask your doctor when you can be sexually active. General instructions  Do not drive or use heavy machinery while taking prescription pain medicine.  Do not use any products that contain nicotine or tobacco. These include cigarettes, e-cigarettes, and chewing tobacco. If you need help quitting, ask your doctor.  Take 2-3 deep  breaths every few hours during the day while you get better. This helps expand your lungs and prevent problems.  If you were given a device called an incentive spirometer, use it several times a day to practice deep breathing. Support your chest with a pillow or your arms when you take deep breaths or cough.  Wear compression stockings as told by your doctor.  Weigh yourself every day. This helps to see if your body is holding (retaining) fluid that may make your heart and lungs work harder.  Keep all follow-up visits as told by your doctor. This is important. Contact a doctor if:  You have more redness, swelling, or pain around any cut.  You have more fluid or blood coming from any cut.  Any cut feels warm to the touch.  You have pus or a bad smell coming from any cut.  You have a fever.  You have swelling in your ankles or legs.  You have pain in your legs.  You gain 2 lb (0.9 kg) or more a day.  You feel sick to your stomach or you throw up (vomit).  You have watery poop (diarrhea). Get help right away if:  You have chest pain that goes to your jaw or arms.  You are short of breath.  You have a fast or irregular heartbeat.  You notice a "clicking" in your breastbone (sternum) when you move.  You have any signs of a stroke. "BE FAST" is an easy way to remember the main warning signs: ? B - Balance. Signs are dizziness, sudden trouble walking, or loss of balance. ? E - Eyes. Signs are trouble seeing or a change in how you see. ? F - Face. Signs are sudden weakness or loss of feeling of the face, or the face or eyelid drooping on one side. ? A - Arms. Signs are weakness or loss of feeling in an arm. This happens suddenly and usually on one side of the body. ? S - Speech. Signs are sudden trouble speaking, slurred speech, or trouble understanding what people say. ? T - Time. Time to call emergency services. Write down what time symptoms started.  You have other signs of  a stroke, such as: ? A sudden, very bad headache with no known cause. ? Feeling sick to your stomach. ? Throwing up. ? Jerky movements you cannot control (seizure). These symptoms may be an emergency. Do not wait to see if the symptoms will go away. Get medical help right away. Call your local emergency services (911 in the [place] Do not drive yourself to the hospital. Summary  After the procedure, it is common to have pain or discomfort in the cuts from surgery (incisions).  Do not take baths, swim, or use a hot tub until your doctor says it is okay.  Slowly increase your activities. You may need physical therapy or cardiac rehab.  Weigh yourself every day. This helps to see if your body is  holding fluid. This information is not intended to replace advice given to you by your health care provider. Make sure you discuss any questions you have with your health care provider. Document Revised: 06/17/2018 Document Reviewed: 06/17/2018 Elsevier Patient Education  2021 [employer].

## 2020-12-09 NOTE — Progress Notes (Signed)
      [address]       [place] [address]             [phone]      4 Days Post-Op Procedure(s) (LRB): CORONARY ARTERY BYPASS GRAFTING (CABG), ON PUMP, TIMES FOUR, USING LEFT INTERNAL MAMMARY ARTERY AND BILATERAL ENDOSCOPICALLY HARVESTED GREATER SAPHENOUS VEINS (N/A) TRANSESOPHAGEAL ECHOCARDIOGRAM (TEE) (N/A)   Subjective:  Patient hoping to go home.  She hasn't been sleeping well here.  Asks if she can take OTC medication  + ambulation  Objective: Vital signs in last 24 hours: Temp:  [98.8 F (37.1 C)-99.8 F (37.7 C)] 99.8 F (37.7 C) (02/18 0749) Pulse Rate:  [85-101] 101 (02/18 0749) Cardiac Rhythm: Normal sinus rhythm;Bundle branch block (02/17 2003) Resp:  [18-20] 18 (02/18 0749) BP: (121-146)/(53-83) 146/71 (02/18 0749) SpO2:  [92 %-98 %] 98 % (02/18 0749) Weight:  [47.1 kg] 47.1 kg (02/18 0301)  General appearance: alert, cooperative and no distress Heart: regular rate and rhythm Lungs: clear to auscultation bilaterally Abdomen: soft, non-tender; bowel sounds normal; no masses,  no organomegaly Extremities: edema trace Wound: clean and dry, sutures in place on RLE clean and dry  Lab Results: Recent Labs    12/07/20 0516 12/08/20 0101  WBC 8.1 9.0  HGB 10.9* 11.1*  HCT 30.9* 32.2*  PLT 161 170   BMET:  Recent Labs    12/08/20 0101 12/09/20 0046  NA 134* 136  K 4.2 4.7  CL 105 108  CO2 21* 22  GLUCOSE 78 113*  BUN 30* 32*  CREATININE 1.78* 1.71*  CALCIUM 8.3* 8.2*    PT/INR: No results for input(s): LABPROT, INR in the last 72 hours. ABG    Component Value Date/Time   PHART 7.404 12/06/2020 1002   HCO3 18.1 (L) 12/06/2020 1002   TCO2 19 (L) 12/06/2020 1002   ACIDBASEDEF 6.0 (H) 12/06/2020 1002   O2SAT 98.0 12/06/2020 1002   CBG (last 3)  Recent Labs    12/08/20 2011 12/09/20 0403 12/09/20 0745  GLUCAP 129* 74 105*    Assessment/Plan: S/P Procedure(s) (LRB): CORONARY ARTERY BYPASS GRAFTING (CABG), ON PUMP,  TIMES FOUR, USING LEFT INTERNAL MAMMARY ARTERY AND BILATERAL ENDOSCOPICALLY HARVESTED GREATER SAPHENOUS VEINS (N/A) TRANSESOPHAGEAL ECHOCARDIOGRAM (TEE) (N/A)  1. CV- NSR, SBP 140s- continue Lopressor, will hold off on antihypertensive with elevated creatinine, can start in future once creatinine improves if needed 2. Pulm- no acute issues, off oxygen continue IS 3. Renal-CKD, creatinine stable to minimally improved at 1.71, K is at 4.7, weight is minimally elevated, but could be third spacing as patient is small, will speak with Dr. Kipp Brood about stopping Lasix 4. DM- uncontrolled pre operatively, needs closely follow up with PCP for A1c of 11.8 5. Dispo- patient stable, creatinine stable,  will d/c home today if okay with Dr. Kipp Brood,    LOS: 9 days    [place] Handler, PA-C 12/09/2020

## 2020-12-14 ENCOUNTER — Telehealth (HOSPITAL_COMMUNITY): Payer: Self-pay

## 2020-12-14 NOTE — Telephone Encounter (Signed)
Pt insurance is active and benefits verified through Medicare a/b Co-pay 0, DED $233/$233 met, out of pocket 0/0 met, co-insurance 20%. no pre-authorization required. Passport, 12/14/2020_0 :03pm, REF# [id number]  2ndary insurance is active and benefits verified through [place]. Co-pay 0, DED 0/0 met, out of pocket 0/0 met, co-insurance 0%. No pre-authorization required. Passport, 12/14/2020_1 :25pm, REF# [phone]  Will contact patient to see if she is interested in the Cardiac Rehab Program. If interested, patient will need to complete follow up appt. Once completed, patient will be contacted for scheduling upon review by the RN Navigator.

## 2020-12-16 ENCOUNTER — Encounter: Payer: Self-pay | Admitting: Thoracic Surgery (Cardiothoracic Vascular Surgery)

## 2020-12-16 ENCOUNTER — Other Ambulatory Visit: Payer: Self-pay

## 2020-12-16 ENCOUNTER — Ambulatory Visit (INDEPENDENT_AMBULATORY_CARE_PROVIDER_SITE_OTHER): Payer: Self-pay | Admitting: Thoracic Surgery (Cardiothoracic Vascular Surgery)

## 2020-12-16 VITALS — BP 150/70 | HR 86 | Resp 20 | Ht 59.0 in | Wt 97.0 lb

## 2020-12-16 DIAGNOSIS — Z951 Presence of aortocoronary bypass graft: Secondary | ICD-10-CM

## 2020-12-16 MED ORDER — FUROSEMIDE 40 MG PO TABS: 40.0000 mg | ORAL_TABLET | Freq: Every day | ORAL | 0 refills | Status: DC

## 2020-12-16 NOTE — Progress Notes (Signed)
      [address]       [place] [address]             [phone]        Mariah Peterson Medical Record #[id number] Date of Birth: 26-Nov-1945  Referring: Jerline Pain, MD Primary Care: Marrian Salvage, FNP Primary Cardiologist:Mark Marlou Porch, MD  Reason for visit:   follow-up  History of Present Illness:     Ms. Mariah Peterson comes in for her 1 week appointment.  Overall she is doing well.  She has no complaints today.  She has noticed some swelling in her feet.  She is using her pain medication but denies much incisional pain.  Physical Exam: BP (!) 150/70   Pulse 86   Resp 20   Ht 4\' 11"  (1.499 m)   Wt 97 lb (44 kg)   SpO2 96% Comment: RA  BMI 19.59 kg/m   Alert NAD Incision clean.  Sternum stable Abdomen soft, ND Trace peripheral edema       Assessment / Plan:   75 year old female status post CABG.  Currently doing well. Given her prescription for 2-week course of Lasix.  She was instructed to eat a banana every day to replace her potassium.  I will see her back in 1 month with a chest x-ray.   Mariah Peterson 12/16/2020 11:51 AM

## 2020-12-22 NOTE — Progress Notes (Signed)
Cardiology Clinic Note   Patient Name: Mariah Peterson Date of Encounter: 12/23/2020  Primary Care Provider:  Isaac Bliss, Rayford Halsted, MD Primary Cardiologist:  Candee Furbish, MD  Patient Profile    Mariah Peterson 75 year old female presents the clinic today status post CABG x4 on 12/05/2020.  Past Medical History    Past Medical History:  Diagnosis Date  . Chest pain 11/30/2020  . Diabetes mellitus without complication ([place])   . Glaucoma   . History of chicken pox   . Hypertension    Past Surgical History:  Procedure Laterality Date  . BREAST BIOPSY Right 2017   benign  . CATARACT EXTRACTION, BILATERAL Bilateral   . CESAREAN SECTION    . COLONOSCOPY  1980's  . CORONARY ARTERY BYPASS GRAFT N/A 12/05/2020   Procedure: CORONARY ARTERY BYPASS GRAFTING (CABG), ON PUMP, TIMES FOUR, USING LEFT INTERNAL MAMMARY ARTERY AND BILATERAL ENDOSCOPICALLY HARVESTED GREATER SAPHENOUS VEINS;  Surgeon: [name], MD;  Location: [facility];  Service: Open Heart Surgery;  Laterality: N/A;  . LEFT HEART CATH AND CORONARY ANGIOGRAPHY N/A 11/30/2020   Procedure: LEFT HEART CATH AND CORONARY ANGIOGRAPHY;  Surgeon: Belva Crome, MD;  Location: [facility];  Service: Cardiovascular;  Laterality: N/A;  . TEE WITHOUT CARDIOVERSION N/A 12/05/2020   Procedure: TRANSESOPHAGEAL ECHOCARDIOGRAM (TEE);  Surgeon: [name], MD;  Location: [facility];  Service: Open Heart Surgery;  Laterality: N/A;  . TUBAL LIGATION      Allergies  No Known Allergies  History of Present Illness    [name] has a PMH of type 2 diabetes, hypertension, hyperlipidemia, and glaucoma.  She had no prior history of coronary artery disease but does have a family history.  She developed sudden onset severe substernal chest tightness with shortness of breath on 11/29/2020.  She denied radiation and felt her symptoms were related to indigestion.  She presented to the emergency department and was ruled in for MI with  serial high-sensitivity troponins.  She reported she had been chest pain-free since admission.  She underwent cardiac catheterization 11/30/2020 which showed severe three-vessel coronary artery disease.  EF was normal and no valvular abnormalities were noted via echocardiogram.  She presented for CABG 12/05/2020 and received bypass x4 by Dr. Kipp Brood.  She was started on midodrine due to hypotension.  She remained in normal sinus rhythm.  Her creatinine increased from 1.59-1.78.  On admission her creatinine was 1.55 and furosemide was held.  Her creatinine remained stable around 1.71.  Her incisions continue to heal well and she progressed with her ambulation without significant difficulty.  She was discharged in stable condition on 12/09/2020.  She presents the clinic today for follow-up evaluation states she feels well.  She has been slowly progressing her physical activity and is walking 5-6 times per day for 5 minutes at a time.  She notes that she has not been sleeping very well at night.  She does report that she has been napping some during the day.  I will have her continue to progress her physical activity as tolerated well maintain sternal precautions.  We will give her the salty 6 diet sheet, the sleep hygiene information, and have her follow-up in 3 months.  Today she denies chest pain, shortness of breath, lower extremity edema, fatigue, palpitations, melena, hematuria, hemoptysis, diaphoresis, weakness, presyncope, syncope, orthopnea, and PND.   Home Medications    Prior to Admission medications   Medication Sig Start Date End Date Taking? Authorizing  Provider  aspirin EC 325 MG EC tablet Take 1 tablet (325 mg total) by mouth daily. 12/09/20   Barrett, Erin R, PA-C  benzonatate (TESSALON) 100 MG capsule Take 1 capsule (100 mg total) by mouth 3 (three) times daily as needed. Patient taking differently: Take 100 mg by mouth 3 (three) times daily as needed for cough. 11/28/20   Marrian Salvage, FNP  calcium carbonate (OSCAL) 1500 (600 Ca) MG TABS tablet Take 1 tablet by mouth 2 (two) times daily with a meal.    [provider]  Continuous Blood Gluc Sensor (FREESTYLE LIBRE 14 DAY SENSOR) MISC Inject 1 each into the skin every 14 (fourteen) days. 03/20/19   Elayne Snare, MD  furosemide (LASIX) 40 MG tablet Take 1 tablet (40 mg total) by mouth daily. 12/16/20   [name], MD  glucose blood test strip Use as instructed to test blood sugar 3 times daily E11.65 11/24/18   Elayne Snare, MD  insulin glargine (LANTUS SOLOSTAR) 100 UNIT/ML Solostar Pen INJECT 16 UNITS UNDER THE SKIN ONCE DAILY. (UPDATED DOSAGE) Patient taking differently: Inject 16-18 Units into the skin 2 (two) times daily. INJECT 16 UNITS UNDER THE SKIN IN THE [place], EVENING & TAKE 18 UNITS AT LUNCH 09/22/20   Elayne Snare, MD  metoprolol tartrate (LOPRESSOR) 25 MG tablet Take 0.5 tablets (12.5 mg total) by mouth 2 (two) times daily. 12/09/20   Barrett, Erin R, PA-C  NOVOLOG FLEXPEN 100 UNIT/ML FlexPen INJECT 8 UNITS UNDER THE SKIN BEFORE BREAKFAST AND 6 UNITS BEFORE DINNER. Patient taking differently: Inject 6-8 Units into the skin 3 (three) times daily with meals. Inject 8 units in the morning, 6 units at lunch and 8 units at bedtime 10/13/20   Elayne Snare, MD  oxyCODONE (OXY IR/ROXICODONE) 5 MG immediate release tablet Take 1 tablet (5 mg total) by mouth every 4 (four) hours as needed for severe pain. 12/09/20   Barrett, Erin R, PA-C  rosuvastatin (CRESTOR) 40 MG tablet TAKE 1 TABLET EVERY DAY 09/22/20   Elayne Snare, MD  Semaglutide,0.25 or 0.5MG /DOS, (OZEMPIC, 0.25 OR 0.5 MG/DOSE,) 2 MG/1.5ML SOPN Inject 0.5 mg into the skin once a week. 08/18/20   Elayne Snare, MD  Travoprost, BAK Free, (TRAVATAN) 0.004 % SOLN ophthalmic solution Place 1 drop into both eyes at bedtime. 11/24/18   Elayne Snare, MD    Family History    Family History  Problem Relation Age of Onset  . Hypertension Mother   . Diabetes  Brother   . CAD Brother   . Hypertension Brother   . Diabetes Brother   . Hypertension Brother   . Colon cancer Neg Hx   . Esophageal cancer Neg Hx   . Pancreatic cancer Neg Hx   . Liver disease Neg Hx   . Stomach cancer Neg Hx   . Rectal cancer Neg Hx   . Colon polyps Neg Hx    She indicated that her mother is alive. She indicated that her father is deceased. She indicated that her sister is alive. She indicated that both of her brothers are alive. She indicated that her maternal grandmother is deceased. She indicated that her maternal grandfather is deceased. She indicated that her paternal grandmother is deceased. She indicated that her paternal grandfather is deceased. She indicated that her daughter is alive. She indicated that the status of her neg hx is unknown.  Social History    Social History   Socioeconomic History  . Marital status: Married  Spouse name: Not on file  . Number of children: 1  . Years of education: Not on file  . Highest education level: Not on file  Occupational History  . Occupation: Retired  Tobacco Use  . Smoking status: Never Smoker  . Smokeless tobacco: Never Used  Vaping Use  . Vaping Use: Never used  Substance and Sexual Activity  . Alcohol use: No  . Drug use: No  . Sexual activity: Yes  Other Topics Concern  . Not on file  Social History Narrative  . Not on file   Social Determinants of Health   Financial Resource Strain: Low Risk   . Difficulty of Paying Living Expenses: Not hard at all  Food Insecurity: No Food Insecurity  . Worried About Charity fundraiser in the Last Year: Never true  . Ran Out of Food in the Last Year: Never true  Transportation Needs: No Transportation Needs  . Lack of Transportation (Medical): No  . Lack of Transportation (Non-Medical): No  Physical Activity: Sufficiently Active  . Days of Exercise per Week: 5 days  . Minutes of Exercise per Session: 30 min  Stress: No Stress Concern Present  .  Feeling of Stress : Not at all  Social Connections: Socially Integrated  . Frequency of Communication with Friends and Family: More than three times a week  . Frequency of Social Gatherings with Friends and Family: More than three times a week  . Attends Religious Services: More than 4 times per year  . Active Member of Clubs or Organizations: Yes  . Attends Archivist Meetings: More than 4 times per year  . Marital Status: Married  Human resources officer Violence: Not At Risk  . Fear of Current or Ex-Partner: No  . Emotionally Abused: No  . Physically Abused: No  . Sexually Abused: No     Review of Systems    General:  No chills, fever, night sweats or weight changes.  Cardiovascular:  No chest pain, dyspnea on exertion, edema, orthopnea, palpitations, paroxysmal nocturnal dyspnea. Dermatological: No rash, lesions/masses Respiratory: No cough, dyspnea Urologic: No hematuria, dysuria Abdominal:   No nausea, vomiting, diarrhea, bright red blood per rectum, melena, or hematemesis Neurologic:  No visual changes, wkns, changes in mental status. All other systems reviewed and are otherwise negative except as noted above.  Physical Exam    VS:  BP 130/68   Pulse 82   Ht 4\' 11"  (1.499 m)   Wt 94 lb 9.6 oz (42.9 kg)   SpO2 96%   BMI 19.11 kg/m  , BMI Body mass index is 19.11 kg/m. GEN: Well nourished, well developed, in no acute distress. HEENT: normal. Neck: Supple, no JVD, carotid bruits, or masses. Cardiac: RRR, no murmurs, rubs, or gallops. No clubbing, cyanosis, edema.  Radials/DP/PT 2+ and equal bilaterally.  Respiratory:  Respirations regular and unlabored, clear to auscultation bilaterally. GI: Soft, nontender, nondistended, BS + x 4. MS: no deformity or atrophy. Skin: warm and dry, no rash. Neuro:  Strength and sensation are intact. Psych: Normal affect.  Accessory Clinical Findings    Recent Labs: 10/10/2020: TSH 2.08 11/29/2020: ALT 18 12/06/2020: Magnesium  3.1 12/08/2020: Hemoglobin 11.1; Platelets 170 12/09/2020: BUN 32; Creatinine, Ser 1.71; Potassium 4.7; Sodium 136   Recent Lipid Panel    Component Value Date/Time   CHOL 302 (H) 11/30/2020 0514   TRIG 108 11/30/2020 0514   HDL 54 11/30/2020 0514   CHOLHDL 5.6 11/30/2020 0514   VLDL 22 11/30/2020 0514  [place] 226 (H) 11/30/2020 0514    ECG personally reviewed by me today-normal sinus rhythm right bundle branch block anterior infarct undetermined age 76 bpm- No acute changes  Echocardiogram 11/30/2020 IMPRESSIONS    1. Left ventricular ejection fraction, by estimation, is 55 to 60%. The  left ventricle has normal function. The left ventricle has no regional  wall motion abnormalities. Left ventricular diastolic parameters were  normal.  2. Right ventricular systolic function is normal. The right ventricular  size is normal.  3. The mitral valve is normal in structure. Trivial mitral valve  regurgitation. No evidence of mitral stenosis.  4. The aortic valve is normal in structure. Aortic valve regurgitation is  not visualized. No aortic stenosis is present.  5. The inferior vena cava is normal in size with greater than 50%  respiratory variability, suggesting right atrial pressure of 3 mmHg.   Cardiac catheterization 11/30/2020   Widely patent left main  Heavily calcified left main, and LAD.  LAD contains significant tortuosity.  In the distal third there is 50% narrowing followed by an area of ectasia and then 70% narrowing near the apex.  Beyond this 3 moderate-sized diagonals are arise.  Circumflex gives origin to 3 obtuse marginals.  The first is moderate in size and contains a region of angulation in which there is a 90% stenosis.  A large ramus intermedius is tortuous and contains proximal 95% stenosis.  The stenosis is within an acute bend.  Right coronary is diffusely diseased proximally with 90% stenosis.  The vessel is small in diameter.  PDA is totally occluded  and fills by right to right and left-to-right collaterals.  Normal LV function.  Numerous left ventricular AV fistula a cause left ventricular filling during left coronary injection.  RECOMMENDATIONS:   Calcific and ectatic coronary disease with marked tortuosity making any PCI procedure very difficult.  The LAD does not contain significant proximal disease.  There is moderate distal disease.  Would recommend consideration of coronary bypass grafting for ramus, circumflex, PDA, and distal LAD.  If this is not possible, perhaps he ramus intermedius could be stented although will be difficult due to tortuosity and calcification.  Diagnostic Dominance: Right    Intervention    Assessment & Plan   1.  NSTEMI/coronary artery disease-no chest pain today.  Incisions healing well.  Chest tube sites clean dry intact no drainage.  Underwent cardiac catheterization 11/30/2020 which showed severe multivessel coronary artery disease.  She underwent CABG x4 on 12/05/2020.  She maintained sinus rhythm and progressed well.  She was discharged on 12/09/2020. Continue aspirin, furosemide, metoprolol, rosuvastatin Heart healthy low-sodium diet-salty 6 given Increase physical activity as tolerated  Hyperlipidemia-11/30/2020: Cholesterol 302; HDL 54; LDL Cholesterol 226; Triglycerides 108; VLDL 22 Continue rosuvastatin Heart healthy low-sodium high-fiber diet Increase physical activity as tolerated  Essential hypertension-BP today 130/68.  Well-controlled at home. Continue metoprolol, furosemide Heart healthy low-sodium diet-salty 6 given Increase physical activity as tolerated  Diabetes mellitus-glucose 113 on 12/09/2020 Continue insulin, semaglutide Heart healthy low-sodium carb modified diet Increase physical activity as tolerated Follows with PCP  Disposition: Follow-up with Dr. Marlou Porch in 3 months.  Jossie Ng. Arella Blinder NP-C    12/23/2020, 10:44 AM [address] Office [phone] Fax [phone]  Notice: This dictation was prepared with Dragon dictation along with smaller phrase technology. Any transcriptional errors that result from this process are unintentional and may not be corrected upon review.  I spent 15 minutes  examining this patient, reviewing medications, and using patient centered shared decision making involving her cardiac care.  Prior to her visit I spent greater than 20 minutes reviewing her past medical history,  medications, and prior cardiac tests.

## 2020-12-23 ENCOUNTER — Other Ambulatory Visit: Payer: Self-pay

## 2020-12-23 ENCOUNTER — Encounter: Payer: Self-pay | Admitting: General Practice

## 2020-12-23 ENCOUNTER — Ambulatory Visit (INDEPENDENT_AMBULATORY_CARE_PROVIDER_SITE_OTHER): Payer: Medicare Other | Admitting: General Practice

## 2020-12-23 VITALS — BP 130/68 | HR 82 | Ht 59.0 in | Wt 94.6 lb

## 2020-12-23 DIAGNOSIS — E782 Mixed hyperlipidemia: Secondary | ICD-10-CM

## 2020-12-23 DIAGNOSIS — I10 Essential (primary) hypertension: Secondary | ICD-10-CM | POA: Diagnosis not present

## 2020-12-23 DIAGNOSIS — E0800 Diabetes mellitus due to underlying condition with hyperosmolarity without nonketotic hyperglycemic-hyperosmolar coma (NKHHC): Secondary | ICD-10-CM

## 2020-12-23 DIAGNOSIS — Z951 Presence of aortocoronary bypass graft: Secondary | ICD-10-CM

## 2020-12-23 DIAGNOSIS — I214 Non-ST elevation (NSTEMI) myocardial infarction: Secondary | ICD-10-CM | POA: Diagnosis not present

## 2020-12-23 NOTE — Patient Instructions (Addendum)
Medication Instructions:  The current medical regimen is effective;  continue present plan and medications as directed. Please refer to the Current Medication list given to you today.  *If you need a refill on your cardiac medications before your next appointment, please call your pharmacy*  Lab Work:   Testing/Procedures:  NONE    NONE  Special Instructions PLEASE READ AND FOLLOW SLEEP TIPS-ATTACHED  PLEASE READ AND FOLLOW SALTY 6-ATTACHED-1,800mg  daily  PLEASE INCREASE PHYSICAL ACTIVITY AS TOLERATED  Follow-Up: Your next appointment:  3 month(s) In Person with You may see Candee Furbish, MD IF UNAVAILABLE Coletta Memos, FNP-C OR Kathyrn Drown, NP         At [facility], you and your health needs are our priority.  As part of our continuing mission to provide you with exceptional heart care, we have created designated Provider Care Teams.  These Care Teams include your primary Cardiologist (physician) and Advanced Practice Providers (APPs -  Physician Assistants and Nurse Practitioners) who all work together to provide you with the care you need, when you need it.  We recommend signing up for the patient portal called "MyChart".  Sign up information is provided on this After Visit Summary.  MyChart is used to connect with patients for Virtual Visits (Telemedicine).  Patients are able to view lab/test results, encounter notes, upcoming appointments, etc.  Non-urgent messages can be sent to your provider as well.   To learn more about what you can do with MyChart, go to [email or web address].              6 SALTY THINGS TO AVOID     1,800MG  DAILY    Quality Sleep Information, Adult Quality sleep is important for your mental and physical health. It also improves your quality of life. Quality sleep means you:  Are asleep for most of the time you are in bed.  Fall asleep within 30 minutes.  Wake up no more than once a night.  Are awake for no longer than 20 minutes if you do wake  up during the night. Most adults need 7-8 hours of quality sleep each night. How can poor sleep affect me? If you do not get enough quality sleep, you may have:  Mood swings.  Daytime sleepiness.  Confusion.  Decreased reaction time.  Sleep disorders, such as insomnia and sleep apnea.  Difficulty with: ? Solving problems. ? Coping with stress. ? Paying attention. These issues may affect your performance and productivity at work, school, and at home. Lack of sleep may also put you at higher risk for accidents, suicide, and risky behaviors. If you do not get quality sleep you may also be at higher risk for several health problems, including:  Infections.  Type 2 diabetes.  Heart disease.  High blood pressure.  Obesity.  Worsening of long-term conditions, like arthritis, kidney disease, depression, Parkinson's disease, and epilepsy. What actions can I take to get more quality sleep?  Stick to a sleep schedule. Go to sleep and wake up at about the same time each day. Do not try to sleep less on weekdays and make up for lost sleep on weekends. This does not work.  Try to get about 30 minutes of exercise on most days. Do not exercise 2-3 hours before going to bed.  Limit naps during the day to 30 minutes or less.  Do not use any products that contain nicotine or tobacco, such as cigarettes or e-cigarettes. If you need help quitting, ask your health care  provider.  Do not drink caffeinated beverages for at least 8 hours before going to bed. Coffee, tea, and some sodas contain caffeine.  Do not drink alcohol close to bedtime.  Do not eat large meals close to bedtime.  Do not take naps in the late afternoon.  Try to get at least 30 minutes of sunlight every day. Morning sunlight is best.  Make time to relax before bed. Reading, listening to music, or taking a hot bath promotes quality sleep.  Make your bedroom a place that promotes quality sleep. Keep your bedroom dark,  quiet, and at a comfortable room temperature. Make sure your bed is comfortable. Take out sleep distractions like TV, a computer, smartphone, and bright lights.  If you are lying awake in bed for longer than 20 minutes, get up and do a relaxing activity until you feel sleepy.  Work with your health care provider to treat medical conditions that may affect sleeping, such as: ? Nasal obstruction. ? Snoring. ? Sleep apnea and other sleep disorders.  Talk to your health care provider if you think any of your prescription medicines may cause you to have difficulty falling or staying asleep.  If you have sleep problems, talk with a sleep consultant. If you think you have a sleep disorder, talk with your health care provider about getting evaluated by a specialist.       Where to find more information  [place] website: https://sleepfoundation.org  National Heart, Lung, and [place] ([place]): http://www.saunders.info/.pdf  Centers for Disease Control and Prevention (CDC): LearningDermatology.pl Contact a health care provider if you:  Have trouble getting to sleep or staying asleep.  Often wake up very early in the morning and cannot get back to sleep.  Have daytime sleepiness.  Have daytime sleep attacks of suddenly falling asleep and sudden muscle weakness (narcolepsy).  Have a tingling sensation in your legs with a strong urge to move your legs (restless legs syndrome).  Stop breathing briefly during sleep (sleep apnea).  Think you have a sleep disorder or are taking a medicine that is affecting your quality of sleep. Summary  Most adults need 7-8 hours of quality sleep each night.  Getting enough quality sleep is an important part of health and well-being.  Make your bedroom a place that promotes quality sleep and avoid things that may cause you to have poor sleep, such as alcohol, caffeine, smoking, and large  meals.  Talk to your health care provider if you have trouble falling asleep or staying asleep.

## 2021-01-12 ENCOUNTER — Other Ambulatory Visit: Payer: Self-pay | Admitting: Thoracic Surgery (Cardiothoracic Vascular Surgery)

## 2021-01-12 DIAGNOSIS — Z951 Presence of aortocoronary bypass graft: Secondary | ICD-10-CM

## 2021-01-13 ENCOUNTER — Ambulatory Visit (INDEPENDENT_AMBULATORY_CARE_PROVIDER_SITE_OTHER): Payer: Self-pay | Admitting: Thoracic Surgery (Cardiothoracic Vascular Surgery)

## 2021-01-13 ENCOUNTER — Ambulatory Visit
Admission: RE | Admit: 2021-01-13 | Discharge: 2021-01-13 | Disposition: A | Discharge status: Home / Self Care | Payer: Medicare Other | Source: Ambulatory Visit | Attending: Thoracic Surgery (Cardiothoracic Vascular Surgery) | Admitting: Thoracic Surgery (Cardiothoracic Vascular Surgery)

## 2021-01-13 ENCOUNTER — Encounter: Payer: Self-pay | Admitting: Thoracic Surgery (Cardiothoracic Vascular Surgery)

## 2021-01-13 ENCOUNTER — Other Ambulatory Visit: Payer: Self-pay | Admitting: *Deleted

## 2021-01-13 ENCOUNTER — Other Ambulatory Visit: Payer: Self-pay

## 2021-01-13 VITALS — BP 150/79 | HR 67 | Temp 97.7°F | Resp 20 | Ht 59.0 in | Wt 92.0 lb

## 2021-01-13 DIAGNOSIS — Z951 Presence of aortocoronary bypass graft: Secondary | ICD-10-CM

## 2021-01-13 NOTE — Progress Notes (Signed)
Order for referral to cardiac rehab placed per Dr. Kipp Brood.

## 2021-01-13 NOTE — Progress Notes (Signed)
      [address]       [place], [address]             [phone]        Azlee N Gras [place] Medical Record #[id number] Date of Birth: April 20, 1946  Referring: Jerline Pain, MD Primary Care: Isaac Bliss, Rayford Halsted, MD Primary Cardiologist:Mark Marlou Porch, MD  Reason for visit:   follow-up  History of Present Illness:     Mariah Peterson presents for her 1 month follow-up appointment.  She has been doing well.  She has no complaints today.  Physical Exam: BP (!) 150/79   Pulse 67   Temp 97.7 F (36.5 C) (Skin)   Resp 20   Ht 4\' 11"  (1.499 m)   Wt 92 lb (41.7 kg)   SpO2 98% Comment: RA  BMI 18.58 kg/m   Alert NAD Incision clean.  Sternum stable Abdomen soft, ND No peripheral edema   Diagnostic Studies & Laboratory data: CXR: Small left effusion.     Assessment / Plan:   75 year old female status post CABG.  She continues to do well. Cleared for cardiac rehab. We will need to adjust blood pressure medication given hypertension today. She is no longer on Lasix.  Her swelling is much improved. We will follow up as needed.   Mariah Peterson 01/13/2021 12:08 PM

## 2021-01-17 ENCOUNTER — Encounter: Payer: Self-pay | Admitting: Internal Medicine

## 2021-01-17 ENCOUNTER — Other Ambulatory Visit: Payer: Self-pay

## 2021-01-17 ENCOUNTER — Ambulatory Visit (INDEPENDENT_AMBULATORY_CARE_PROVIDER_SITE_OTHER): Payer: Medicare Other | Admitting: Internal Medicine

## 2021-01-17 VITALS — BP 140/70 | HR 62 | Temp 98.8°F | Ht 59.0 in | Wt 94.0 lb

## 2021-01-17 DIAGNOSIS — I2510 Atherosclerotic heart disease of native coronary artery without angina pectoris: Secondary | ICD-10-CM

## 2021-01-17 DIAGNOSIS — I10 Essential (primary) hypertension: Secondary | ICD-10-CM

## 2021-01-17 DIAGNOSIS — Z951 Presence of aortocoronary bypass graft: Secondary | ICD-10-CM | POA: Diagnosis not present

## 2021-01-17 DIAGNOSIS — E0800 Diabetes mellitus due to underlying condition with hyperosmolarity without nonketotic hyperglycemic-hyperosmolar coma (NKHHC): Secondary | ICD-10-CM

## 2021-01-17 DIAGNOSIS — I214 Non-ST elevation (NSTEMI) myocardial infarction: Secondary | ICD-10-CM

## 2021-01-17 DIAGNOSIS — E782 Mixed hyperlipidemia: Secondary | ICD-10-CM

## 2021-01-17 NOTE — Progress Notes (Signed)
Established Patient Office Visit     This visit occurred during the SARS-CoV-2 public health emergency.  Safety protocols were in place, including screening questions prior to the visit, additional usage of staff PPE, and extensive cleaning of exam room while observing appropriate contact time as indicated for disinfecting solutions.    CC/Reason for Visit: Establish care, discuss chronic medical conditions  HPI: Mariah Peterson is a 75 y.o. female who is coming in today for the above mentioned reasons. Past Medical History is significant for: Hypertension, hyperlipidemia, insulin-dependent diabetes followed by Dr. Dwyane Dee.  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  She is followed by thoracic surgeon Dr. Kipp Brood, her cardiologist is Dr. Marlou Porch.  Referral has been made to cardiac rehab.  She was taken off semaglutide for her diabetes due to profound weight loss.  She is now on Lantus 16 units at bedtime and NovoLog which she does between 6 to 8 units twice a day.  Her fasting blood glucose is usually around 70, she will quite frequently have midmorning lows in the 40s despite a good breakfast, in the afternoon and evenings they will be in the high 100s.  Her last A1c was 11.8 in February.  Her daughter is a Marine scientist who assists with all medication administration.   Past Medical/Surgical History: Past Medical History:  Diagnosis Date  . Chest pain 11/30/2020  . Diabetes mellitus without complication ([place])   . Glaucoma   . History of chicken pox   . Hypertension     Past Surgical History:  Procedure Laterality Date  . BREAST BIOPSY Right 2017   benign  . CATARACT EXTRACTION, BILATERAL Bilateral   . CESAREAN SECTION    . COLONOSCOPY  1980's  . CORONARY ARTERY BYPASS GRAFT N/A 12/05/2020   Procedure: CORONARY ARTERY BYPASS GRAFTING (CABG), ON PUMP, TIMES FOUR, USING LEFT INTERNAL MAMMARY ARTERY AND  BILATERAL ENDOSCOPICALLY HARVESTED GREATER SAPHENOUS VEINS;  Surgeon: [name], MD;  Location: [facility];  Service: Open Heart Surgery;  Laterality: N/A;  . LEFT HEART CATH AND CORONARY ANGIOGRAPHY N/A 11/30/2020   Procedure: LEFT HEART CATH AND CORONARY ANGIOGRAPHY;  Surgeon: Belva Crome, MD;  Location: [facility];  Service: Cardiovascular;  Laterality: N/A;  . TEE WITHOUT CARDIOVERSION N/A 12/05/2020   Procedure: TRANSESOPHAGEAL ECHOCARDIOGRAM (TEE);  Surgeon: [name], MD;  Location: [facility];  Service: Open Heart Surgery;  Laterality: N/A;  . TUBAL LIGATION      Social History:  reports that she has never smoked. She has never used smokeless tobacco. She reports that she does not drink alcohol and does not use drugs.  Allergies: No Known Allergies  Family History:  Family History  Problem Relation Age of Onset  . Hypertension Mother   . Diabetes Brother   . CAD Brother   . Hypertension Brother   . Diabetes Brother   . Hypertension Brother   . Colon cancer Neg Hx   . Esophageal cancer Neg Hx   . Pancreatic cancer Neg Hx   . Liver disease Neg Hx   . Stomach cancer Neg Hx   . Rectal cancer Neg Hx   . Colon polyps Neg Hx      Current Outpatient Medications:  .  aspirin EC 325 MG EC tablet, Take 1 tablet (325 mg total) by mouth daily., Disp: 30 tablet, Rfl: 0 .  calcium carbonate (OSCAL) 1500 (600 Ca) MG TABS tablet, Take 1 tablet by mouth 2 (two) times daily with a meal., Disp: , Rfl:  .  Continuous Blood Gluc Sensor (FREESTYLE LIBRE 14 DAY SENSOR) MISC, Inject 1 each into the skin every 14 (fourteen) days., Disp: 2 each, Rfl: 5 .  furosemide (LASIX) 40 MG tablet, Take 1 tablet (40 mg total) by mouth daily. (Patient taking differently: Take 40 mg by mouth daily. prn), Disp: 14 tablet, Rfl: 0 .  glucose blood test strip, Use as instructed to test blood sugar 3 times daily E11.65, Disp: 270 each, Rfl: 1 .  insulin glargine (LANTUS SOLOSTAR) 100 UNIT/ML  Solostar Pen, INJECT 16 UNITS UNDER THE SKIN ONCE DAILY. (UPDATED DOSAGE) (Patient taking differently: Inject into the skin daily. INJECT 16 UNITS UNDER THE SKIN ONCE DAILY. (UPDATED DOSAGE)), Disp: 15 mL, Rfl: 1 .  metoprolol tartrate (LOPRESSOR) 25 MG tablet, Take 0.5 tablets (12.5 mg total) by mouth 2 (two) times daily., Disp: 30 tablet, Rfl: 3 .  NOVOLOG FLEXPEN 100 UNIT/ML FlexPen, INJECT 8 UNITS UNDER THE SKIN BEFORE BREAKFAST AND 6 UNITS BEFORE DINNER. (Patient taking differently: Inject 6-8 Units into the skin 3 (three) times daily with meals. Inject 8 units in the morning, 6 units at lunch), Disp: 15 mL, Rfl: 3 .  rosuvastatin (CRESTOR) 40 MG tablet, TAKE 1 TABLET EVERY DAY, Disp: 90 tablet, Rfl: 1 .  oxyCODONE (OXY IR/ROXICODONE) 5 MG immediate release tablet, Take 1 tablet (5 mg total) by mouth every 4 (four) hours as needed for severe pain. (Patient not taking: Reported on 01/17/2021), Disp: 30 tablet, Rfl: 0  Review of Systems:  Constitutional: Denies fever, chills, diaphoresis, appetite change and fatigue.  HEENT: Denies photophobia, eye pain, redness, hearing loss, ear pain, congestion, sore throat, rhinorrhea, sneezing, mouth sores, trouble swallowing, neck pain, neck stiffness and tinnitus.   Respiratory: Denies SOB, DOE, cough, chest tightness,  and wheezing.   Cardiovascular: Denies chest pain, palpitations and leg swelling.  Gastrointestinal: Denies nausea, vomiting, abdominal pain, diarrhea, constipation, blood in stool and abdominal distention.  Genitourinary: Denies dysuria, urgency, frequency, hematuria, flank pain and difficulty urinating.  Endocrine: Denies: hot or cold intolerance, sweats, changes in hair or nails, polyuria, polydipsia. Musculoskeletal: Denies myalgias, back pain, joint swelling, arthralgias and gait problem.  Skin: Denies pallor, rash and wound.  Neurological: Denies dizziness, seizures, syncope, weakness, light-headedness, numbness and headaches.   Hematological: Denies adenopathy. Easy bruising, personal or family bleeding history  Psychiatric/Behavioral: Denies suicidal ideation, mood changes, confusion, nervousness, sleep disturbance and agitation    Physical Exam: Vitals:   01/17/21 1257  BP: 140/70  Pulse: 62  Temp: 98.8 F (37.1 C)  TempSrc: Oral  SpO2: 98%  Weight: 94 lb (42.6 kg)  Height: 4\' 11"  (1.499 m)    Body mass index is 18.99 kg/m.   Constitutional: NAD, calm, comfortable Eyes: PERRL, lids and conjunctivae normal ENMT: Mucous membranes are moist.  Respiratory: clear to auscultation bilaterally, no wheezing, no crackles. Normal respiratory effort. No accessory muscle use.  Cardiovascular: Regular rate and rhythm, no murmurs / rubs / gallops. No extremity edema. 2+ pedal pulses.  Recent median sternotomy is well-healed. Neurologic: Grossly intact and nonfocal Psychiatric: Normal judgment and insight. Alert and oriented x 3. Normal mood.    Impression and Plan:  Coronary artery disease involving native heart without angina pectoris, unspecified vessel or lesion type NSTEMI (non-ST elevated myocardial infarction) (HCC) S/P CABG x 4 -Followed by cardiology and thoracic surgery. -She has  been healing well, has had no further chest pain or shortness of breath. -Will be starting cardiac rehab soon.  Primary hypertension -Remains elevated above goal. -She will do ambulatory blood pressure monitoring and return either with myself or with cardiology for follow-up.  Currently she is only on metoprolol 12.5 mg twice daily and Lasix 40 mg daily as she had some episodes of hypotension while in the hospital.  Mixed hyperlipidemia -Last LDL was quite elevated at 226 at the time of her cardiac event. -She is now on rosuvastatin 40 mg daily.  It is still too soon to recheck her lipids.  Diabetes mellitus due to underlying condition with hyperosmolarity without coma, unspecified whether long term insulin use  ([place]) -Not well controlled.  Despite her recent elevated A1c at 11.8, she is having frequent morning hypoglycemia in the 40s.  I would assume her nighttime Lantus dose needs to be decreased. -Have advised her daughter to make an appointment as soon as possible with her endocrinologist to assess.    Lelon Frohlich, MD [facility]

## 2021-02-07 ENCOUNTER — Telehealth (HOSPITAL_COMMUNITY): Payer: Self-pay | Admitting: Pharmacist

## 2021-02-07 ENCOUNTER — Ambulatory Visit: Payer: Self-pay | Admitting: Physician Assistant

## 2021-02-07 ENCOUNTER — Other Ambulatory Visit: Payer: Self-pay

## 2021-02-07 ENCOUNTER — Telehealth: Payer: Self-pay

## 2021-02-07 DIAGNOSIS — Z9889 Other specified postprocedural states: Secondary | ICD-10-CM

## 2021-02-07 MED ORDER — DICLOFENAC SODIUM 1 % EX GEL: 2.0000 g | Freq: Three times a day (TID) | CUTANEOUS | Status: DC

## 2021-02-07 NOTE — Telephone Encounter (Signed)
Patient contacted the office requesting an appointment to come in and have sutures removed from her upper thigh.  She is s/p CABGx4 with Dr. Kipp Brood 12/05/20.  She stated that the sutures rub against her leg and it is painful.  She also stated that she was having trouble with pain in both her legs.  Advised that she could come into the office tomorrow, 02/07/21 for suture removal.  She acknowledged receipt.

## 2021-02-07 NOTE — Progress Notes (Signed)
  HPI:  Patient returns for follow up for possible retained suture. Nurse asked me to evaluate patient to ensure there were no more sutures that needed to be removed and address patient's complaint of "pins and needles and numbness" along both inner thighs. Patient is s/p CABG x 4 by Dr. Kipp Brood on 12/05/2020.  Current Outpatient Medications  Medication Sig Dispense Refill  . aspirin EC 325 MG EC tablet Take 1 tablet (325 mg total) by mouth daily. 30 tablet 0  . calcium carbonate (OSCAL) 1500 (600 Ca) MG TABS tablet Take 1 tablet by mouth 2 (two) times daily with a meal.    . Continuous Blood Gluc Sensor (FREESTYLE LIBRE 14 DAY SENSOR) MISC Inject 1 each into the skin every 14 (fourteen) days. 2 each 5  . furosemide (LASIX) 40 MG tablet Take 1 tablet (40 mg total) by mouth daily. (Patient taking differently: Take 40 mg by mouth daily. prn) 14 tablet 0  . glucose blood test strip Use as instructed to test blood sugar 3 times daily E11.65 270 each 1  . insulin glargine (LANTUS SOLOSTAR) 100 UNIT/ML Solostar Pen INJECT 16 UNITS UNDER THE SKIN ONCE DAILY. (UPDATED DOSAGE) (Patient taking differently: Inject into the skin daily. INJECT 16 UNITS UNDER THE SKIN ONCE DAILY. (UPDATED DOSAGE)) 15 mL 1  . metoprolol tartrate (LOPRESSOR) 25 MG tablet Take 0.5 tablets (12.5 mg total) by mouth 2 (two) times daily. 30 tablet 3  . NOVOLOG FLEXPEN 100 UNIT/ML FlexPen INJECT 8 UNITS UNDER THE SKIN BEFORE BREAKFAST AND 6 UNITS BEFORE DINNER. (Patient taking differently: Inject 6-8 Units into the skin 3 (three) times daily with meals. Inject 8 units in the morning, 6 units at lunch) 15 mL 3  . oxyCODONE (OXY IR/ROXICODONE) 5 MG immediate release tablet Take 1 tablet (5 mg total) by mouth every 4 (four) hours as needed for severe pain. (Patient not taking: Reported on 01/17/2021) 30 tablet 0  . oxyCODONE (OXY IR/ROXICODONE) 5 MG immediate release tablet TAKE 1 TABLET (5 MG TOTAL) BY MOUTH EVERY 4 (FOUR) HOURS AS NEEDED  FOR SEVERE PAIN. 30 tablet 0  . rosuvastatin (CRESTOR) 40 MG tablet TAKE 1 TABLET EVERY DAY 90 tablet 1   Current Facility-Administered Medications  Medication Dose Route Frequency Provider Last Rate Last Admin  . diclofenac Sodium (VOLTAREN) 1 % topical gel 2 g  2 g Topical TID Nani Skillern, PA-C        Physical Exam: Bilateral thighs: Well healed multiple incisions. 2 Nylon sutures removed from right upper most thigh wound. Patient very sensitive to touching inner thighs;some "pins and needles"   Impression and Plan: Overall, Mrs. Mariah Peterson is recovering well from coronary artery bypass grafting surgery. 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 We had a discussion about using topical Voltaren or Neurontin to help with her symptoms. Of note, her last creatinine 12/09/2020 was 1.71 (down from her admission creatinine of 1.85). She wishes to try Voltaren so a prescription was sent to her pharmacy. I only prescribed it three times daily instead of four secondary to previously mentioned renal insufficiency. She will return in 2 weeks for further evaluation with Dr. Kipp Brood.     Nani Skillern, PA-C [facility] [phone]

## 2021-02-21 NOTE — Telephone Encounter (Signed)
Cardiac Rehab Medication Review by a Pharmacist  Does the patient  feel that his/her medications are working for him/her?  no  Has the patient been experiencing any side effects to the medications prescribed?  no  Does the patient measure his/her own blood pressure or blood glucose at home?   Takes BP occasionally ~118/93 is on the higher end for her BG (uses freestyle libre) 80-120s   Does the patient have any problems obtaining medications due to transportation or finances?   No  Understanding of regimen: good Understanding of indications: good Potential of compliance: good   Pharmacist Intervention: n/a    Mariah Peterson, PharmD PGY1 Pharmacy Resident 02/21/2021 3:53 PM

## 2021-02-22 ENCOUNTER — Telehealth (HOSPITAL_COMMUNITY): Payer: Self-pay | Admitting: *Deleted

## 2021-02-23 ENCOUNTER — Other Ambulatory Visit: Payer: Self-pay

## 2021-02-23 ENCOUNTER — Encounter (HOSPITAL_COMMUNITY)
Admission: RE | Admit: 2021-02-23 | Discharge: 2021-02-23 | Disposition: A | Discharge status: Home / Self Care | Payer: Medicare Other | Source: Ambulatory Visit | Attending: Cardiology | Admitting: Cardiology

## 2021-02-23 ENCOUNTER — Encounter (HOSPITAL_COMMUNITY): Payer: Self-pay

## 2021-02-23 VITALS — BP 142/70 | HR 80 | Ht <= 58 in | Wt 90.4 lb

## 2021-02-23 DIAGNOSIS — I214 Non-ST elevation (NSTEMI) myocardial infarction: Secondary | ICD-10-CM | POA: Insufficient documentation

## 2021-02-23 DIAGNOSIS — Z951 Presence of aortocoronary bypass graft: Secondary | ICD-10-CM | POA: Insufficient documentation

## 2021-02-23 HISTORY — DX: Atherosclerotic heart disease of native coronary artery without angina pectoris: I25.10

## 2021-02-23 NOTE — Progress Notes (Signed)
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  aspirin EC 325 MG EC tablet, Take 1 tablet (325 mg total) by mouth daily. (Patient not taking: No sig reported), Disp: 30 tablet, Rfl: 0 .  calcium carbonate (OSCAL) 1500 (600 Ca) MG TABS tablet, Take 1 tablet by mouth 2 (two) times daily with a meal., Disp: , Rfl:  .  Continuous Blood Gluc Sensor (FREESTYLE LIBRE 14 DAY SENSOR) MISC, Inject 1 each into the skin every 14 (fourteen) days., Disp: 2 each, Rfl: 5 .  furosemide (LASIX) 40 MG tablet, Take 1 tablet (40 mg total) by mouth daily. (Patient not taking: Reported on 02/21/2021), Disp: 14 tablet, Rfl: 0 .  glucose blood test strip, Use as instructed to test blood sugar 3 times daily E11.65, Disp: 270 each, Rfl: 1 .  insulin glargine (LANTUS SOLOSTAR) 100 UNIT/ML Solostar Pen, INJECT 16 UNITS UNDER THE SKIN ONCE DAILY. (UPDATED DOSAGE) (Patient taking differently: Inject into the skin daily. INJECT 16 UNITS UNDER THE SKIN ONCE DAILY. (UPDATED DOSAGE)), Disp: 15 mL, Rfl: 1 .  metoprolol tartrate (LOPRESSOR) 25 MG tablet, Take 0.5 tablets (12.5 mg total) by mouth 2 (two) times daily., Disp: 30 tablet, Rfl: 3 .  NOVOLOG FLEXPEN 100 UNIT/ML FlexPen, INJECT 8 UNITS UNDER THE SKIN BEFORE BREAKFAST AND 6 UNITS BEFORE DINNER. (Patient taking differently: Inject 6-8 Units into the skin 3 (three) times daily with meals. Inject 8 units in the morning, 6 units at lunch), Disp: 15 mL,  Rfl: 3 .  oxyCODONE (OXY IR/ROXICODONE) 5 MG immediate release tablet, TAKE 1 TABLET (5 MG TOTAL) BY MOUTH EVERY 4 (FOUR) HOURS AS NEEDED FOR SEVERE PAIN. (Patient not taking: Reported on 02/21/2021), Disp: 30 tablet, Rfl: 0 .  rosuvastatin (CRESTOR) 40 MG tablet, TAKE 1 TABLET EVERY DAY, Disp: 90 tablet, Rfl: 1  Current Facility-Administered Medications:  .  diclofenac Sodium (VOLTAREN) 1 % topical gel 2 g, 2 g, Topical, TID, Nani Skillern, PA-C  Past Medical History: Past Medical History:  Diagnosis Date  . Chest pain 11/30/2020  . Coronary artery disease   . Diabetes mellitus without complication ([place])   . Glaucoma   . History of chicken pox   . Hypertension     Tobacco Use: Social History   Tobacco Use  Smoking Status Never Smoker  Smokeless Tobacco Never Used    Labs: Recent Review Flowsheet Data    Labs for ITP Cardiac and Pulmonary Rehab Latest Ref Rng & Units 12/05/2020 12/05/2020 12/05/2020 12/05/2020 12/06/2020   Cholestrol 0 - 200 mg/dL - - - - -   LDLCALC 0 - 99 mg/dL - - - - -   HDL >40 mg/dL - - - - -   Trlycerides <150 mg/dL - - - - -   Hemoglobin A1c 4.8 - 5.6 % - - - - -   PHART 7.350 - 7.450 - - 7.441 7.358 7.404   PCO2ART 32.0 - 48.0 mmHg - - 33.7 38.1  28.9(L)   HCO3 20.0 - 28.0 mmol/L - - 23.0 21.6 18.1(L)   TCO2 22 - 32 mmol/L $RemoveB'24 22 24 23 'xStSNozs$ 19(L)   ACIDBASEDEF 0.0 - 2.0 mmol/L - - 1.0 4.0(H) 6.0(H)   O2SAT % - - 100.0 93.0 98.0      Capillary Blood Glucose: Lab Results  Component Value Date   GLUCAP 105 (H) 12/09/2020   GLUCAP 74 12/09/2020   GLUCAP 129 (H) 12/08/2020   GLUCAP 94 12/08/2020   GLUCAP 141 (H) 12/08/2020     Exercise Target Goals: Exercise Program Goal: Individual exercise prescription set using results from initial 6 min walk test and THRR while considering  patient's activity barriers and safety.   Exercise Prescription Goal: Starting with aerobic activity 30 plus minutes a day, 3 days per week for initial exercise  prescription. Provide home exercise prescription and guidelines that participant acknowledges understanding prior to discharge.  Activity Barriers & Risk Stratification:  Activity Barriers & Cardiac Risk Stratification - 02/23/21 1137      Activity Barriers & Cardiac Risk Stratification   Activity Barriers Arthritis;Balance Concerns;Incisional Pain    Cardiac Risk Stratification High   Achieved under 5 MET's          6 Minute Walk:  6 Minute Walk    Row Name 02/23/21 1136         6 Minute Walk   Distance 1461 feet     Walk Time 6 minutes     # of Rest Breaks 0     MPH 2.77     METS 2.04     RPE 11     Perceived Dyspnea  0     VO2 Peak 10.63     Symptoms No     Resting HR 80 bpm     Resting BP 142/70     Resting Oxygen Saturation  98 %     Exercise Oxygen Saturation  during 6 min walk 100 %     Max Ex. HR 90 bpm     Max Ex. BP 138/72     2 Minute Post BP 134/68            Oxygen Initial Assessment:   Oxygen Re-Evaluation:   Oxygen Discharge (Final Oxygen Re-Evaluation):   Initial Exercise Prescription:  Initial Exercise Prescription - 02/23/21 1100      Date of Initial Exercise RX and Referring Provider   Date 02/23/21    Referring Provider Dr Candee Furbish MD    Expected Discharge Date 04/21/21      Recumbant Bike   Level 1.5    RPM 80    Minutes 15    METs 2.3      NuStep   Level 2    SPM 80    Minutes 15    METs 2.3      Prescription Details   Frequency (times per week) 3    Duration Progress to 30 minutes of continuous aerobic without signs/symptoms of physical distress      Intensity   THRR 40-80% of Max Heartrate 59-118    Ratings of Perceived Exertion 11-13    Perceived Dyspnea 0-4      Progression   Progression Continue progressive overload as per policy without signs/symptoms or physical distress.      Resistance Training   Training Prescription Yes    Weight 3    Reps 10-15           Perform Capillary Blood Glucose  checks  as needed.  Exercise Prescription Changes:   Exercise Comments:   Exercise Goals and Review:   Exercise Goals    Row Name 02/23/21 1145             Exercise Goals   Increase Physical Activity Yes       Intervention Provide advice, education, support and counseling about physical activity/exercise needs.;Develop an individualized exercise prescription for aerobic and resistive training based on initial evaluation findings, risk stratification, comorbidities and participant's personal goals.       Expected Outcomes Short Term: Attend rehab on a regular basis to increase amount of physical activity.;Long Term: Add in home exercise to make exercise part of routine and to increase amount of physical activity.;Long Term: Exercising regularly at least 3-5 days a week.       Increase Strength and Stamina Yes       Intervention Provide advice, education, support and counseling about physical activity/exercise needs.;Develop an individualized exercise prescription for aerobic and resistive training based on initial evaluation findings, risk stratification, comorbidities and participant's personal goals.       Expected Outcomes Short Term: Increase workloads from initial exercise prescription for resistance, speed, and METs.;Short Term: Perform resistance training exercises routinely during rehab and add in resistance training at home;Long Term: Improve cardiorespiratory fitness, muscular endurance and strength as measured by increased METs and functional capacity (6MWT)       Able to understand and use rate of perceived exertion (RPE) scale Yes       Intervention Provide education and explanation on how to use RPE scale       Expected Outcomes Short Term: Able to use RPE daily in rehab to express subjective intensity level;Long Term:  Able to use RPE to guide intensity level when exercising independently       Knowledge and understanding of Target Heart Rate Range (THRR) Yes        Intervention Provide education and explanation of THRR including how the numbers were predicted and where they are located for reference       Expected Outcomes Short Term: Able to state/look up THRR;Long Term: Able to use THRR to govern intensity when exercising independently;Short Term: Able to use daily as guideline for intensity in rehab       Understanding of Exercise Prescription Yes       Intervention Provide education, explanation, and written materials on patient's individual exercise prescription       Expected Outcomes Short Term: Able to explain program exercise prescription;Long Term: Able to explain home exercise prescription to exercise independently              Exercise Goals Re-Evaluation :    Discharge Exercise Prescription (Final Exercise Prescription Changes):   Nutrition:  Target Goals: Understanding of nutrition guidelines, daily intake of sodium '1500mg'$ , cholesterol '200mg'$ , calories 30% from fat and 7% or less from saturated fats, daily to have 5 or more servings of fruits and vegetables.  Biometrics:  Pre Biometrics - 02/23/21 1133      Pre Biometrics   Waist Circumference 30.25 inches    Hip Circumference 33.5 inches    Waist to Hip Ratio 0.9 %    Triceps Skinfold 29 mm    % Body Fat 35 %    Grip Strength 24 kg    Flexibility 13 in    Single Leg Stand 5 seconds            Nutrition Therapy Plan and Nutrition Goals:   Nutrition  Assessments:  MEDIFICTS Score Key:  ?70 Need to make dietary changes   40-70 Heart Healthy Diet  ? 40 Therapeutic Level Cholesterol Diet   Picture Your Plate Scores:  <82 Unhealthy dietary pattern with much room for improvement.  41-50 Dietary pattern unlikely to meet recommendations for good health and room for improvement.  51-60 More healthful dietary pattern, with some room for improvement.   >60 Healthy dietary pattern, although there may be some specific behaviors that could be improved.    Nutrition  Goals Re-Evaluation:   Nutrition Goals Discharge (Final Nutrition Goals Re-Evaluation):   Psychosocial: Target Goals: Acknowledge presence or absence of significant depression and/or stress, maximize coping skills, provide positive support system. Participant is able to verbalize types and ability to use techniques and skills needed for reducing stress and depression.  Initial Review & Psychosocial Screening:  Initial Psych Review & Screening - 02/23/21 1129      Initial Review   Current issues with None Identified      Family Dynamics   Good Support System? Yes   Mariah Peterson has her husband, daughter and grandson for support     Barriers   Psychosocial barriers to participate in program There are no identifiable barriers or psychosocial needs.      Screening Interventions   Interventions Encouraged to exercise           Quality of Life Scores:  Quality of Life - 02/23/21 1147      Quality of Life   Select Quality of Life      Quality of Life Scores   Health/Function Pre 25.6 %    Socioeconomic Pre 26.57 %    Psych/Spiritual Pre 23.14 %    Family Pre 25.2 %    GLOBAL Pre 25.24 %          Scores of 19 and below usually indicate a poorer quality of life in these areas.  A difference of  2-3 points is a clinically meaningful difference.  A difference of 2-3 points in the total score of the Quality of Life Index has been associated with significant improvement in overall quality of life, self-image, physical symptoms, and general health in studies assessing change in quality of life.  PHQ-9: Recent Review Flowsheet Data    Depression screen [facility] 2/9 02/23/2021 02/23/2021 10/19/2020 10/10/2020 09/30/2019   Decreased Interest 0 0 0 0 0   Down, Depressed, Hopeless 0 0 0 0 0   PHQ - 2 Score 0 0 0 0 0   Altered sleeping - - - - 1   Tired, decreased energy - - - - 1   Change in appetite - - - - 1   Feeling bad or failure about yourself  - - - - 0   Trouble concentrating - - - - 0    Moving slowly or fidgety/restless - - - - 0   Suicidal thoughts - - - - 0   PHQ-9 Score - - - - 3   Difficult doing work/chores - - - - Not difficult at all     Interpretation of Total Score  Total Score Depression Severity:  1-4 = Minimal depression, 5-9 = Mild depression, 10-14 = Moderate depression, 15-19 = Moderately severe depression, 20-27 = Severe depression   Psychosocial Evaluation and Intervention:   Psychosocial Re-Evaluation:   Psychosocial Discharge (Final Psychosocial Re-Evaluation):   Vocational Rehabilitation: Provide vocational rehab assistance to qualifying candidates.   Vocational Rehab Evaluation & Intervention:  Vocational Rehab - 02/23/21  1046      Initial Vocational Rehab Evaluation & Intervention   Assessment shows need for Vocational Rehabilitation No   Mariah Peterson is retired and does not need vocational rehab at this time          Education: Education Goals: Education classes will be provided on a weekly basis, covering required topics. Participant will state understanding/return demonstration of topics presented.  Learning Barriers/Preferences:   Education Topics: Hypertension, Hypertension Reduction -Define heart disease and high blood pressure. Discus how high blood pressure affects the body and ways to reduce high blood pressure.   Exercise and Your Heart -Discuss why it is important to exercise, the FITT principles of exercise, normal and abnormal responses to exercise, and how to exercise safely.   Angina -Discuss definition of angina, causes of angina, treatment of angina, and how to decrease risk of having angina.   Cardiac Medications -Review what the following cardiac medications are used for, how they affect the body, and side effects that may occur when taking the medications.  Medications include Aspirin, Beta blockers, calcium channel blockers, ACE Inhibitors, angiotensin receptor blockers, diuretics, digoxin, and  antihyperlipidemics.   Congestive Heart Failure -Discuss the definition of CHF, how to live with CHF, the signs and symptoms of CHF, and how keep track of weight and sodium intake.   Heart Disease and Intimacy -Discus the effect sexual activity has on the heart, how changes occur during intimacy as we age, and safety during sexual activity.   Smoking Cessation / COPD -Discuss different methods to quit smoking, the health benefits of quitting smoking, and the definition of COPD.   Nutrition I: Fats -Discuss the types of cholesterol, what cholesterol does to the heart, and how cholesterol levels can be controlled.   Nutrition II: Labels -Discuss the different components of food labels and how to read food label   Heart Parts/Heart Disease and PAD -Discuss the anatomy of the heart, the pathway of blood circulation through the heart, and these are affected by heart disease.   Stress I: Signs and Symptoms -Discuss the causes of stress, how stress may lead to anxiety and depression, and ways to limit stress.   Stress II: Relaxation -Discuss different types of relaxation techniques to limit stress.   Warning Signs of Stroke / TIA -Discuss definition of a stroke, what the signs and symptoms are of a stroke, and how to identify when someone is having stroke.   Knowledge Questionnaire Score:  Knowledge Questionnaire Score - 02/23/21 1148      Knowledge Questionnaire Score   Pre Score 20/24           Core Components/Risk Factors/Patient Goals at Admission:  Personal Goals and Risk Factors at Admission - 02/23/21 1148      Core Components/Risk Factors/Patient Goals on Admission    Weight Management Weight Maintenance;Yes    Intervention Weight Management: Develop a combined nutrition and exercise program designed to reach desired caloric intake, while maintaining appropriate intake of nutrient and fiber, sodium and fats, and appropriate energy expenditure required for the  weight goal.;Weight Management: Provide education and appropriate resources to help participant work on and attain dietary goals.    Expected Outcomes Short Term: Continue to assess and modify interventions until short term weight is achieved;Long Term: Adherence to nutrition and physical activity/exercise program aimed toward attainment of established weight goal;Weight Maintenance: Understanding of the daily nutrition guidelines, which includes 25-35% calories from fat, 7% or less cal from saturated fats, less than $RemoveB'200mg'CPnGHbsy$  cholesterol,  less than 1.5gm of sodium, & 5 or more servings of fruits and vegetables daily    Diabetes Yes    Intervention Provide education about signs/symptoms and action to take for hypo/hyperglycemia.;Provide education about proper nutrition, including hydration, and aerobic/resistive exercise prescription along with prescribed medications to achieve blood glucose in normal ranges: Fasting glucose 65-99 mg/dL    Expected Outcomes Short Term: Participant verbalizes understanding of the signs/symptoms and immediate care of hyper/hypoglycemia, proper foot care and importance of medication, aerobic/resistive exercise and nutrition plan for blood glucose control.;Long Term: Attainment of HbA1C < 7%.    Hypertension Yes    Intervention Provide education on lifestyle modifcations including regular physical activity/exercise, weight management, moderate sodium restriction and increased consumption of fresh fruit, vegetables, and low fat dairy, alcohol moderation, and smoking cessation.;Monitor prescription use compliance.    Expected Outcomes Short Term: Continued assessment and intervention until BP is < 140/30mm HG in hypertensive participants. < 130/36mm HG in hypertensive participants with diabetes, heart failure or chronic kidney disease.;Long Term: Maintenance of blood pressure at goal levels.    Lipids Yes    Intervention Provide education and support for participant on nutrition &  aerobic/resistive exercise along with prescribed medications to achieve LDL '70mg'$ , HDL >$Remo'40mg'zWVEk$ .    Expected Outcomes Short Term: Participant states understanding of desired cholesterol values and is compliant with medications prescribed. Participant is following exercise prescription and nutrition guidelines.;Long Term: Cholesterol controlled with medications as prescribed, with individualized exercise RX and with personalized nutrition plan. Value goals: LDL < $Rem'70mg'QXws$ , HDL > 40 mg.    Stress Yes    Intervention Offer individual and/or small group education and counseling on adjustment to heart disease, stress management and health-related lifestyle change. Teach and support self-help strategies.;Refer participants experiencing significant psychosocial distress to appropriate mental health specialists for further evaluation and treatment. When possible, include family members and significant others in education/counseling sessions.    Expected Outcomes Short Term: Participant demonstrates changes in health-related behavior, relaxation and other stress management skills, ability to obtain effective social support, and compliance with psychotropic medications if prescribed.;Long Term: Emotional wellbeing is indicated by absence of clinically significant psychosocial distress or social isolation.    Personal Goal Other Yes    Personal Goal Pt wants to feel safe and independant. She also wants to regain strength and feel good about her heart health.    Intervention Will progress workloads as tolerated without sign or symptom    Expected Outcomes Pt will achieve her goals within the [facility] program and feel better about her health           Core Components/Risk Factors/Patient Goals Review:    Core Components/Risk Factors/Patient Goals at Discharge (Final Review):    ITP Comments:  ITP Comments    Row Name 02/23/21 1045           ITP Comments Dr Fransico Him MD, Medical Director               Comments:Mariah Peterson  attended orientation on 02/23/2021 to review rules and guidelines for program.  Completed 6 minute walk test, Intitial ITP, and exercise prescription.  VSS. 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  Asymptomatic. Safety measures and social distancing in place per CDC guidelines.Barnet Pall, RN,BSN 02/23/2021 1:45 PM

## 2021-02-24 ENCOUNTER — Ambulatory Visit: Payer: Medicare Other | Admitting: Thoracic Surgery (Cardiothoracic Vascular Surgery)

## 2021-02-24 ENCOUNTER — Ambulatory Visit (INDEPENDENT_AMBULATORY_CARE_PROVIDER_SITE_OTHER): Payer: Self-pay | Admitting: Thoracic Surgery (Cardiothoracic Vascular Surgery)

## 2021-02-24 ENCOUNTER — Encounter: Payer: Self-pay | Admitting: Thoracic Surgery (Cardiothoracic Vascular Surgery)

## 2021-02-24 VITALS — BP 168/76 | HR 83 | Resp 20 | Ht <= 58 in | Wt 90.0 lb

## 2021-02-24 DIAGNOSIS — Z951 Presence of aortocoronary bypass graft: Secondary | ICD-10-CM

## 2021-02-24 MED ORDER — PREGABALIN 25 MG PO CAPS: 25.0000 mg | ORAL_CAPSULE | Freq: Two times a day (BID) | ORAL | 0 refills | Status: DC

## 2021-02-24 NOTE — Progress Notes (Signed)
      [address]       [place] [address]             [phone]        Luellen N Jawad [place] Medical Record #[id number] Date of Birth: Sep 03, 1946  Referring: Jerline Pain, MD Primary Care: Isaac Bliss, Rayford Halsted, MD Primary Cardiologist:Mark Marlou Porch, MD  Reason for visit:   follow-up  History of Present Illness:     75 yo female comes to discuss pain at her leg incisions.  She describes it as sharp pain over the incision.  She denies any redness or swelling.  She denies any fevers of chills  Physical Exam: BP (!) 168/76 (BP Location: Left Arm, Patient Position: Sitting)   Pulse 83   Resp 20   Ht 4\' 8"  (1.422 m)   Wt 90 lb (40.8 kg)   SpO2 98% Comment: RA  BMI 20.18 kg/m   Alert NAD Leg incision well healed.  No erythema or swell.  Tender to palpation. no peripheral edema       Assessment / Plan:   75 yo female s/p CABG with skip incisions for vein harvest.  She has some hypersensitivity over the incisions.  I have given her a rx for Lyrica.  I will have a virtual visit in 2 weeks to assess for any improvement.   [name] 02/24/2021 12:32 PM

## 2021-02-27 ENCOUNTER — Other Ambulatory Visit: Payer: Self-pay

## 2021-02-27 ENCOUNTER — Encounter (HOSPITAL_COMMUNITY)
Admission: RE | Admit: 2021-02-27 | Discharge: 2021-02-27 | Disposition: A | Discharge status: Home / Self Care | Payer: Medicare Other | Source: Ambulatory Visit | Attending: Cardiology | Admitting: Cardiology

## 2021-02-27 DIAGNOSIS — Z951 Presence of aortocoronary bypass graft: Secondary | ICD-10-CM | POA: Diagnosis present

## 2021-02-27 DIAGNOSIS — I214 Non-ST elevation (NSTEMI) myocardial infarction: Secondary | ICD-10-CM | POA: Diagnosis not present

## 2021-02-27 LAB — GLUCOSE, CAPILLARY: Glucose-Capillary: 106 mg/dL — ABNORMAL HIGH (ref 70–99)

## 2021-02-27 NOTE — Progress Notes (Addendum)
Incomplete Session Note  Patient Details  Name: Mariah Peterson MRN: [id number] Date of Birth: 1946/07/25 Referring Provider:   Flowsheet Row CARDIAC REHAB PHASE II ORIENTATION from 02/23/2021 in [place]  Referring Provider Dr Candee Furbish MD      [name] did not complete her rehab session.  CBG 107. No exercise per protocol. [name] said that she ate oatmeal this morning for breakfast.  [name] said that she took 8 units of Novo log this morning. Patient advised to have some fruit with her oatmeal. [name] says that her blood sugar was 160 prior to leaving home.Medication list reviewed. Barnet Pall, RN,BSN 02/27/2021 12:33 PM

## 2021-03-01 ENCOUNTER — Other Ambulatory Visit: Payer: Self-pay

## 2021-03-01 ENCOUNTER — Encounter (HOSPITAL_COMMUNITY)
Admission: RE | Admit: 2021-03-01 | Discharge: 2021-03-01 | Disposition: A | Discharge status: Home / Self Care | Payer: Medicare Other | Source: Ambulatory Visit | Attending: Cardiology | Admitting: Cardiology

## 2021-03-01 DIAGNOSIS — I214 Non-ST elevation (NSTEMI) myocardial infarction: Secondary | ICD-10-CM

## 2021-03-01 DIAGNOSIS — Z951 Presence of aortocoronary bypass graft: Secondary | ICD-10-CM

## 2021-03-01 LAB — GLUCOSE, CAPILLARY
Glucose-Capillary: 176 mg/dL — ABNORMAL HIGH (ref 70–99)
Glucose-Capillary: 112 mg/dL — ABNORMAL HIGH (ref 70–99)

## 2021-03-01 NOTE — Progress Notes (Signed)
Daily Session Note  Patient Details  Name: Mariah Peterson MRN: [id number] Date of Birth: 10/21/1946 Referring Provider:   Flowsheet Row CARDIAC REHAB PHASE II ORIENTATION from 02/23/2021 in [facility] CARDIAC [facility]  Referring Provider Dr Donato Schultz MD      Encounter Date: 03/01/2021  Check In:  Session Check In - 03/01/21 1158      Check-In   Supervising physician immediately available to respond to emergencies [facility] immediately available    Physician(s) Dr. Caleb Popp    Location [facility]    Staff Present Cristy Hilts, MS, ACSM CEP, Exercise Physiologist;Fawnda Vitullo, RN, BSN;Jetta Walker BS, ACSM EP-C, Exercise Physiologist;David Roxbury, MS, ACSM-CEP, CCRP, Exercise Physiologist;Carlette Les Pou, Charity fundraiser, BSN    Virtual Visit No    Medication changes reported     No    Fall or balance concerns reported    No    Tobacco Cessation No Change    Warm-up and Cool-down Performed on first and last piece of equipment    Resistance Training Performed No    VAD Patient? No    PAD/SET Patient? No      Pain Assessment   Currently in Pain? No/denies    Pain Score 0-No pain    Multiple Pain Sites No           Capillary Blood Glucose: Results for orders placed or performed during the hospital encounter of 03/01/21 (from the past 24 hour(s))  Glucose, capillary     Status: Abnormal   Collection Time: 03/01/21 11:08 AM  Result Value Ref Range   Glucose-Capillary 176 (H) 70 - 99 mg/dL  Glucose, capillary     Status: Abnormal   Collection Time: 03/01/21 12:06 PM  Result Value Ref Range   Glucose-Capillary 112 (H) 70 - 99 mg/dL     Exercise Prescription Changes - 03/01/21 1115      Response to Exercise   Blood Pressure (Admit) 150/82    Blood Pressure (Exercise) 162/80    Blood Pressure (Exit) 140/80    Heart Rate (Admit) 76 bpm    Heart Rate (Exercise) 111 bpm    Heart Rate (Exit) 81 bpm    Rating of Perceived Exertion (Exercise)  11    Symptoms none    Comments Off to a good start with exercise.    Duration Continue with 30 min of aerobic exercise without signs/symptoms of physical distress.    Intensity THRR unchanged      Progression   Progression Continue to progress workloads to maintain intensity without signs/symptoms of physical distress.    Average METs 2.7      Resistance Training   Training Prescription No      Interval Training   Interval Training No      Recumbant Bike   Level 1.5    Minutes 15    METs 2.9      NuStep   Level 2    SPM 85    Minutes 15    METs 2.5           Social History   Tobacco Use  Smoking Status Never Smoker  Smokeless Tobacco Never Used    Goals Met:  Personal goals reviewed No report of cardiac concerns or symptoms Strength training completed today  Goals Unmet:  Not Applicable  Comments: [name] started cardiac rehab today.  Pt tolerated light exercise without difficulty. Systolic BP's ranged from 140-162 systolic,diastolic BP 72-80  telemetry-Sinus Rhythm,Bundle Branch Block asymptomatic.  Medication list reconciled. Pt denies barriers to medicaiton compliance.  PSYCHOSOCIAL ASSESSMENT:  PHQ-0. Pt exhibits positive coping skills, hopeful outlook with supportive family. No psychosocial needs identified at this time, no psychosocial interventions necessary.    Pt enjoys watching TV, puzzles and reading the bible.   Pt oriented to exercise equipment and routine.    Understanding verbalized. Will continue to monitor BP.Barnet Pall, RN,BSN 03/01/2021 3:43 PM   Dr. Fransico Him is Medical Director for Cardiac Rehab at [facility].

## 2021-03-02 ENCOUNTER — Telehealth (HOSPITAL_COMMUNITY): Payer: Self-pay | Admitting: Internal Medicine

## 2021-03-03 ENCOUNTER — Other Ambulatory Visit: Payer: Self-pay

## 2021-03-03 ENCOUNTER — Encounter (HOSPITAL_COMMUNITY): Payer: Medicare Other

## 2021-03-03 ENCOUNTER — Other Ambulatory Visit: Payer: Self-pay | Admitting: Endocrinology

## 2021-03-03 ENCOUNTER — Other Ambulatory Visit (INDEPENDENT_AMBULATORY_CARE_PROVIDER_SITE_OTHER): Payer: Medicare Other

## 2021-03-03 DIAGNOSIS — E7800 Pure hypercholesterolemia, unspecified: Secondary | ICD-10-CM | POA: Diagnosis not present

## 2021-03-03 DIAGNOSIS — E1165 Type 2 diabetes mellitus with hyperglycemia: Secondary | ICD-10-CM | POA: Diagnosis not present

## 2021-03-03 DIAGNOSIS — Z794 Long term (current) use of insulin: Secondary | ICD-10-CM

## 2021-03-03 LAB — COMPREHENSIVE METABOLIC PANEL
ALT: 15 U/L (ref 0–35)
AST: 20 U/L (ref 0–37)
Albumin: 3.9 g/dL (ref 3.5–5.2)
Alkaline Phosphatase: 51 U/L (ref 39–117)
BUN: 48 mg/dL — ABNORMAL HIGH (ref 6–23)
CO2: 27 mEq/L (ref 19–32)
Calcium: 10 mg/dL (ref 8.4–10.5)
Chloride: 106 mEq/L (ref 96–112)
Creatinine, Ser: 1.58 mg/dL — ABNORMAL HIGH (ref 0.40–1.20)
GFR: 32.18 mL/min — ABNORMAL LOW (ref >60.00)
Glucose, Bld: 189 mg/dL — ABNORMAL HIGH (ref 70–99)
Potassium: 4.7 mEq/L (ref 3.5–5.1)
Sodium: 140 mEq/L (ref 135–145)
Total Bilirubin: 0.4 mg/dL (ref 0.2–1.2)
Total Protein: 6.8 g/dL (ref 6.0–8.3)

## 2021-03-03 LAB — LIPID PANEL
Cholesterol: 163 mg/dL (ref 0–200)
HDL: 66.8 mg/dL (ref >39.00)
LDL Cholesterol: 77 mg/dL (ref 0–99)
NonHDL: 96.52
Total CHOL/HDL Ratio: 2
Triglycerides: 97 mg/dL (ref 0.0–149.0)
VLDL: 19.4 mg/dL (ref 0.0–40.0)

## 2021-03-03 LAB — HEMOGLOBIN A1C: Hgb A1c MFr Bld: 8.7 % — ABNORMAL HIGH (ref 4.6–6.5)

## 2021-03-06 ENCOUNTER — Other Ambulatory Visit: Payer: Self-pay

## 2021-03-06 ENCOUNTER — Encounter (HOSPITAL_COMMUNITY)
Admission: RE | Admit: 2021-03-06 | Discharge: 2021-03-06 | Disposition: A | Discharge status: Home / Self Care | Payer: Medicare Other | Source: Ambulatory Visit | Attending: Cardiology | Admitting: Cardiology

## 2021-03-06 DIAGNOSIS — I214 Non-ST elevation (NSTEMI) myocardial infarction: Secondary | ICD-10-CM | POA: Diagnosis not present

## 2021-03-06 DIAGNOSIS — Z951 Presence of aortocoronary bypass graft: Secondary | ICD-10-CM

## 2021-03-06 LAB — GLUCOSE, CAPILLARY: Glucose-Capillary: 77 mg/dL (ref 70–99)

## 2021-03-06 NOTE — Progress Notes (Signed)
Post exercise CBG 79 via CGM. CBG 79 with hospital meter. Patient asymptomatic. Patient given Ginger ale. Recheck CBG 81 with CGM 5 minutes after drinking Ginger ale. Patient to see Dr Dwyane Dee tomorrow. Will have Shalisha ask Dr Dwyane Dee to decrease set Insulin dose in the morning on Exercise day's at cardiac rehab. Patient given exercise flow sheets from cardiac rehab for Dr Dwyane Dee to review.Barnet Pall, RN,BSN 03/06/2021 12:30 PM

## 2021-03-07 ENCOUNTER — Ambulatory Visit (INDEPENDENT_AMBULATORY_CARE_PROVIDER_SITE_OTHER): Payer: Medicare Other | Admitting: Endocrinology

## 2021-03-07 ENCOUNTER — Encounter: Payer: Self-pay | Admitting: Endocrinology

## 2021-03-07 ENCOUNTER — Telehealth: Payer: Self-pay | Admitting: Endocrinology

## 2021-03-07 ENCOUNTER — Other Ambulatory Visit: Payer: Self-pay | Admitting: *Deleted

## 2021-03-07 VITALS — BP 118/78 | HR 54 | Ht <= 58 in | Wt 94.2 lb

## 2021-03-07 DIAGNOSIS — E1165 Type 2 diabetes mellitus with hyperglycemia: Secondary | ICD-10-CM

## 2021-03-07 DIAGNOSIS — Z794 Long term (current) use of insulin: Secondary | ICD-10-CM

## 2021-03-07 DIAGNOSIS — I2510 Atherosclerotic heart disease of native coronary artery without angina pectoris: Secondary | ICD-10-CM | POA: Diagnosis not present

## 2021-03-07 DIAGNOSIS — N183 Chronic kidney disease, stage 3 unspecified: Secondary | ICD-10-CM | POA: Diagnosis not present

## 2021-03-07 DIAGNOSIS — E7800 Pure hypercholesterolemia, unspecified: Secondary | ICD-10-CM | POA: Diagnosis not present

## 2021-03-07 MED ORDER — ROSUVASTATIN CALCIUM 20 MG PO TABS: 20.0000 mg | ORAL_TABLET | Freq: Every day | ORAL | 5 refills | Status: DC

## 2021-03-07 MED ORDER — NOVOLOG FLEXPEN 100 UNIT/ML ~~LOC~~ SOPN: 6.0000 [IU] | PEN_INJECTOR | Freq: Three times a day (TID) | SUBCUTANEOUS | 3 refills | Status: DC

## 2021-03-07 MED ORDER — EZETIMIBE 10 MG PO TABS: 10.0000 mg | ORAL_TABLET | Freq: Every day | ORAL | 3 refills | Status: DC

## 2021-03-07 MED ORDER — LANTUS SOLOSTAR 100 UNIT/ML ~~LOC~~ SOPN
16.0000 [IU] | PEN_INJECTOR | Freq: Every day | SUBCUTANEOUS | 3 refills | Status: DC
Start: 2021-03-07 — End: 2022-05-09

## 2021-03-07 NOTE — Telephone Encounter (Signed)
Pharmacy called and they are looking for Novolog Dosing Instructions for breakfast/lunch/supper

## 2021-03-07 NOTE — Patient Instructions (Addendum)
NOVOLOG:  6 UNITS BEFORE Breakfast on exercise days and if eating eggs 8 Units on other days  LUNCH NOVOLOG 8 UNITS if eating at home and 10 units for fast food  Dinner 6 units Novolog for meals  Ozempic 0.25 mg weekly  Crestor 20 mg daily

## 2021-03-07 NOTE — Progress Notes (Signed)
Patient ID: Mariah Peterson, female   DOB: 10-25-1945, 75 y.o.   MRN: [id number]          Reason for Appointment: Follow-up for Type 2 Diabetes   History of Present Illness:          Date of diagnosis of type 2 diabetes mellitus:?  1990       Background history:   She was started on insulin about 4 years ago and previously taking metformin and glipizide No detailed history is available and she does not think she has taken any other types of diabetes medications or injections  Recent history:    INSULIN regimen is: Lantus 16 units daily in am, Novolog 8 units at breakfast, 8 units ac lunch    Non-insulin hypoglycemic drugs the patient is taking are: None, previously on Actos 15 mg daily, Ozempic 0.5 mg weekly,  Current management, blood sugar patterns and problems identified:  Her A1c is better than usual at 8.7 compared to 11.8  Fructosamine is last 484    She is still taking not Ozempic as directed on her last visit  Previously had not seen any increasing her weight with stopping Ozempic  She is now starting a program of cardiac rehab after her coronary bypass surgery in 2/22  However instead of taking 6 units at breakfast she is taking 8 units and has reversed her dose at lunch also  Also now she says that she is eating a sandwich or other meal at dinnertime, previously was only eating 2 meals a day  She appears to be occasionally getting low sugars in the mornings likely after breakfast  Also her blood sugars are relatively lower after exercise at cardiac rehab, she goes on Mondays Wednesdays and Fridays  Her weight appears to have increased recently  She is doing a little better with monitoring her blood sugars more often than before but usually not after dinner  Interpretation of her CGM data is as follows  Glucose sensor: Freestyle libre    Data is incomplete in the evenings between 6-9 PM approximately but amount of data available is improved compared to her  last visit  HIGHEST blood sugars appear to be between 5-9 PM with some variability  LOWEST blood sugars are on an average of 4-6 AM  HYPOGLYCEMIA has been mild and transient and usually midmorning or before lunch  CGM use % of time  64  2-week average/GV  185/33  Time in range     45   %  % Time Above 180  39  % Time above 250  15  % Time Below 70 1     PRE-MEAL Fasting Lunch Dinner Bedtime Overall  Glucose range:       Averages:  146  159  247   185   POST-MEAL PC Breakfast PC Lunch PC Dinner  Glucose range:     Averages:  189  238  250     Previous data:  CGM use % of time  48  2-week average/SD  135, GV 37  Time in range  29       %  % Time Above 180  29  % Time above 250  42  % Time Below 70 0     PRE-MEAL Fasting Lunch Dinner  8-10 PM Overall  Glucose range:       Averages:  196   243  109  235   POST-MEAL PC Breakfast PC Lunch PC Dinner  Glucose  range:     Averages:  280  306      She was seen by diabetes educator in 9/19       Side effects from medications have been: None  Compliance with the medical regimen: Fair  Typical meal intake: Breakfast is oatmeal, otherwise eggs and toast.  Lunch may be rice and beans or a sandwich at 4 pm Fruit for snacks                Dietician visit, most recent: 09/2018 CDE consultation: 07/01/2018  Weight history:  Wt Readings from Last 3 Encounters:  03/07/21 94 lb 3.2 oz (42.7 kg)  02/24/21 90 lb (40.8 kg)  02/23/21 90 lb 6.2 oz (41 kg)    Glycemic control:   Lab Results  Component Value Date   HGBA1C 8.7 (H) 03/03/2021   HGBA1C 11.8 (H) 12/03/2020   HGBA1C 11.8 (H) 11/10/2020   Lab Results  Component Value Date   MICROALBUR 22.6 (H) 08/15/2020   LDLCALC 77 03/03/2021   CREATININE 1.58 (H) 03/03/2021   Lab Results  Component Value Date   MICRALBCREAT 17.4 08/15/2020    Lab Results  Component Value Date   FRUCTOSAMINE 484 (H) 09/23/2020   FRUCTOSAMINE 518 (H) 08/15/2020   FRUCTOSAMINE 369  (H) 03/29/2020    Hospital Outpatient Visit on 03/06/2021  Component Date Value Ref Range Status  . Glucose-Capillary 03/06/2021 77  70 - 99 mg/dL Final   Glucose reference range applies only to samples taken after fasting for at least 8 hours.  Lab on 03/03/2021  Component Date Value Ref Range Status  . Sodium 03/03/2021 140  135 - 145 mEq/L Final  . Potassium 03/03/2021 4.7  3.5 - 5.1 mEq/L Final  . Chloride 03/03/2021 106  96 - 112 mEq/L Final  . CO2 03/03/2021 27  19 - 32 mEq/L Final  . Glucose, Bld 03/03/2021 189* 70 - 99 mg/dL Final  . BUN 03/03/2021 48* 6 - 23 mg/dL Final  . Creatinine, Ser 03/03/2021 1.58* 0.40 - 1.20 mg/dL Final  . Total Bilirubin 03/03/2021 0.4  0.2 - 1.2 mg/dL Final  . Alkaline Phosphatase 03/03/2021 51  39 - 117 U/L Final  . AST 03/03/2021 20  0 - 37 U/L Final  . ALT 03/03/2021 15  0 - 35 U/L Final  . Total Protein 03/03/2021 6.8  6.0 - 8.3 g/dL Final  . Albumin 03/03/2021 3.9  3.5 - 5.2 g/dL Final  . GFR 03/03/2021 32.18* >60.00 mL/min Final   Calculated using the CKD-EPI Creatinine Equation (2021)  . Calcium 03/03/2021 10.0  8.4 - 10.5 mg/dL Final  . Cholesterol 03/03/2021 163  0 - 200 mg/dL Final   ATP III Classification       Desirable:  < 200 mg/dL               Borderline High:  200 - 239 mg/dL          High:  > = 240 mg/dL  . Triglycerides 03/03/2021 97.0  0.0 - 149.0 mg/dL Final   Normal:  <150 mg/dLBorderline High:  150 - 199 mg/dL  . HDL 03/03/2021 66.80  >39.00 mg/dL Final  . VLDL 03/03/2021 19.4  0.0 - 40.0 mg/dL Final  . LDL Cholesterol 03/03/2021 77  0 - 99 mg/dL Final  . Total CHOL/HDL Ratio 03/03/2021 2   Final                  Men  Women1/2 Average Risk     3.4          3.3Average Risk          5.0          4.42X Average Risk          9.6          7.13X Average Risk          15.0          11.0                      . NonHDL 03/03/2021 96.52   Final   NOTE:  Non-HDL goal should be 30 mg/dL higher than patient's LDL goal (i.e. LDL  goal of < 70 mg/dL, would have non-HDL goal of < 100 mg/dL)  . Hgb A1c MFr Bld 03/03/2021 8.7* 4.6 - 6.5 % Final   Glycemic Control Guidelines for People with Diabetes:Non Diabetic:  <6%Goal of Therapy: <7%Additional Action Suggested:  >8%   Hospital Outpatient Visit on 03/01/2021  Component Date Value Ref Range Status  . Glucose-Capillary 03/01/2021 176* 70 - 99 mg/dL Final   Glucose reference range applies only to samples taken after fasting for at least 8 hours.  . Glucose-Capillary 03/01/2021 112* 70 - 99 mg/dL Final   Glucose reference range applies only to samples taken after fasting for at least 8 hours.    Allergies as of 03/07/2021   No Known Allergies     Medication List       Accurate as of Mar 07, 2021  8:56 PM. If you have any questions, ask your nurse or doctor.        aspirin 325 MG EC tablet Take 1 tablet (325 mg total) by mouth daily.   calcium carbonate 1500 (600 Ca) MG Tabs tablet Commonly known as: OSCAL Take 1 tablet by mouth 2 (two) times daily with a meal.   FreeStyle Libre 14 Day Sensor Misc Inject 1 each into the skin every 14 (fourteen) days.   furosemide 40 MG tablet Commonly known as: LASIX Take 1 tablet (40 mg total) by mouth daily.   glucose blood test strip Use as instructed to test blood sugar 3 times daily E11.65   Lantus SoloStar 100 UNIT/ML Solostar Pen Generic drug: insulin glargine Inject 16 Units into the skin daily. INJECT 16 UNITS UNDER THE SKIN ONCE DAILY. (UPDATED DOSAGE) What changed:   how much to take  how to take this  when to take this Changed by: PEDRAZA, RHONDA, CMA   metoprolol tartrate 25 MG tablet Commonly known as: LOPRESSOR Take 0.5 tablets (12.5 mg total) by mouth 2 (two) times daily.   NovoLOG FlexPen 100 UNIT/ML FlexPen Generic drug: insulin aspart Inject 6-8 Units into the skin 3 (three) times daily with meals. Inject 8 units in the morning, 6 units at lunch   oxyCODONE 5 MG immediate release  tablet Commonly known as: Oxy IR/ROXICODONE TAKE 1 TABLET (5 MG TOTAL) BY MOUTH EVERY 4 (FOUR) HOURS AS NEEDED FOR SEVERE PAIN.   pregabalin 25 MG capsule Commonly known as: Lyrica Take 1 capsule (25 mg total) by mouth 2 (two) times daily.   rosuvastatin 20 MG tablet Commonly known as: CRESTOR Take 1 tablet (20 mg total) by mouth daily. What changed:   medication strength  how much to take Changed by: Tora Duck, CMA       Allergies: No Known Allergies  Past Medical History:  Diagnosis Date  .  Chest pain 11/30/2020  . Coronary artery disease   . Diabetes mellitus without complication ([place])   . Glaucoma   . History of chicken pox   . Hypertension     Past Surgical History:  Procedure Laterality Date  . BREAST BIOPSY Right 2017   benign  . CARDIAC CATHETERIZATION    . CATARACT EXTRACTION, BILATERAL Bilateral   . CESAREAN SECTION    . COLONOSCOPY  1980's  . CORONARY ARTERY BYPASS GRAFT N/A 12/05/2020   Procedure: CORONARY ARTERY BYPASS GRAFTING (CABG), ON PUMP, TIMES FOUR, USING LEFT INTERNAL MAMMARY ARTERY AND BILATERAL ENDOSCOPICALLY HARVESTED GREATER SAPHENOUS VEINS;  Surgeon: [name], MD;  Location: [facility];  Service: Open Heart Surgery;  Laterality: N/A;  . LEFT HEART CATH AND CORONARY ANGIOGRAPHY N/A 11/30/2020   Procedure: LEFT HEART CATH AND CORONARY ANGIOGRAPHY;  Surgeon: Belva Crome, MD;  Location: [facility];  Service: Cardiovascular;  Laterality: N/A;  . TEE WITHOUT CARDIOVERSION N/A 12/05/2020   Procedure: TRANSESOPHAGEAL ECHOCARDIOGRAM (TEE);  Surgeon: [name], MD;  Location: [facility];  Service: Open Heart Surgery;  Laterality: N/A;  . TUBAL LIGATION      Family History  Problem Relation Age of Onset  . Hypertension Mother   . Diabetes Brother   . CAD Brother   . Hypertension Brother   . Diabetes Brother   . Hypertension Brother   . Colon cancer Neg Hx   . Esophageal cancer Neg Hx   . Pancreatic cancer Neg Hx   .  Liver disease Neg Hx   . Stomach cancer Neg Hx   . Rectal cancer Neg Hx   . Colon polyps Neg Hx     Social History:  reports that she has never smoked. She has never used smokeless tobacco. She reports that she does not drink alcohol and does not use drugs.   Review of Systems   Lipid history: Currently on Crestor 40 mg with the following results, previously had not been taking it regularly with LDL as high as 226 She thinks she is taking it more regularly now but just finished her prescription  Lab Results  Component Value Date   CHOL 163 03/03/2021   CHOL 302 (H) 11/30/2020   CHOL 167 08/15/2020   Lab Results  Component Value Date   HDL 66.80 03/03/2021   HDL 54 11/30/2020   HDL 58.90 08/15/2020   Lab Results  Component Value Date   LDLCALC 77 03/03/2021   LDLCALC 226 (H) 11/30/2020   [place] 90 08/15/2020   Lab Results  Component Value Date   TRIG 97.0 03/03/2021   TRIG 108 11/30/2020   TRIG 91.0 08/15/2020   Lab Results  Component Value Date   CHOLHDL 2 03/03/2021   CHOLHDL 5.6 11/30/2020   CHOLHDL 3 08/15/2020   No results found for: LDLDIRECT          Hypertension: Has been present for several years, previously on amlodipine and HCTZ  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 Glucose-Capillary 03/06/2021 77  70 - 99 mg/dL Final   Glucose reference range applies only to samples taken after fasting for at least 8 hours.  Lab  on 03/03/2021  Component Date Value Ref Range Status  . Sodium 03/03/2021 140  135 - 145 mEq/L Final  . Potassium 03/03/2021 4.7  3.5 - 5.1 mEq/L Final  . Chloride 03/03/2021 106  96 - 112 mEq/L Final  . CO2 03/03/2021 27  19 - 32 mEq/L Final  . Glucose, Bld 03/03/2021 189* 70 - 99 mg/dL Final  . BUN 03/03/2021 48* 6 - 23 mg/dL Final  . Creatinine, Ser 03/03/2021 1.58* 0.40 - 1.20 mg/dL Final  . Total Bilirubin 03/03/2021 0.4  0.2 - 1.2 mg/dL Final  . Alkaline Phosphatase 03/03/2021 51  39 - 117 U/L Final  . AST 03/03/2021 20  0 - 37 U/L Final  . ALT 03/03/2021 15  0 - 35 U/L Final  . Total Protein 03/03/2021 6.8  6.0 - 8.3 g/dL Final  . Albumin 03/03/2021 3.9  3.5 - 5.2 g/dL Final  . GFR 03/03/2021 32.18* >60.00 mL/min Final   Calculated using the CKD-EPI Creatinine Equation (2021)  . Calcium 03/03/2021 10.0  8.4 - 10.5 mg/dL Final  . Cholesterol 03/03/2021 163  0 - 200 mg/dL Final   ATP III Classification       Desirable:  < 200 mg/dL               Borderline High:  200 - 239 mg/dL          High:  > = 240 mg/dL  . Triglycerides 03/03/2021 97.0  0.0 - 149.0 mg/dL Final   Normal:  <150 mg/dLBorderline High:  150 - 199 mg/dL  . HDL 03/03/2021 66.80  >39.00 mg/dL Final  . VLDL 03/03/2021 19.4  0.0 - 40.0 mg/dL Final  . LDL Cholesterol 03/03/2021 77  0 - 99 mg/dL Final  . Total CHOL/HDL Ratio 03/03/2021 2   Final                  Men          Women1/2 Average Risk     3.4          3.3Average Risk          5.0          4.42X Average Risk          9.6          7.13X Average Risk          15.0          11.0                      . NonHDL 03/03/2021 96.52   Final   NOTE:  Non-HDL goal should be 30 mg/dL higher than patient's LDL goal (i.e. LDL goal of < 70 mg/dL, would have non-HDL goal of < 100 mg/dL)  . Hgb A1c MFr Bld 03/03/2021 8.7* 4.6 - 6.5 % Final   Glycemic Control Guidelines for People with Diabetes:Non Diabetic:  <6%Goal of Therapy: <7%Additional Action Suggested:  >8%   Hospital  Outpatient Visit on 03/01/2021  Component Date Value Ref Range Status  . Glucose-Capillary 03/01/2021 176* 70 - 99 mg/dL Final   Glucose reference range applies only to samples taken after fasting for at least 8 hours.  Edmonia Lynch  03/01/2021 112* 70 - 99 mg/dL Final   Glucose reference range applies only to samples taken after fasting for at least 8 hours.    Physical Examination:  BP 118/78   Pulse (!) 54   Ht 4\' 8"  (1.422 m)   Wt 94 lb 3.2 oz (42.7 kg)   SpO2 99%   BMI 21.12 kg/m        ASSESSMENT:  Diabetes type 2, insulin-dependent    A1c is now 8.7, previously 11.8  See history of present illness for discussion of current diabetes management, blood sugar patterns and problems identified  She is on basal bolus insulin only She still has periodically high postprandial readings and blood sugars are well over 200 in the afternoon and evening Still not monitoring blood sugars consistently in the evening Also currently not covering carbohydrate like sandwiches at her dinnertime Not taking enough insulin to cover her lunch However may be getting lower readings after exercise in the cardiac rehab program as discussed above and also when she has variable carbohydrates in the mornings  She has not regained any weight with stopping Ozempic and has lost another pound probably from mild hyperglycemia that she is having now Also she does not follow instructions for checking her blood sugar, increasing her NovoLog insulin as directed Not taking Actos as previously prescribed  Renal dysfunction: Better recently  HYPERLIPIDEMIA: She is on Crestor 40 mg but has been taking this irregularly in the past Now with a diagnosis of CAD her LDL target is at least under 70  PLAN:   She will start using Ozempic 0.25 mg weekly for better postprandial control and also long-term cardiovascular risk reduction Discussed that previously this was not appearing to be making her lose  weight  NOVOLOG doses will be adjusted as follows  6 UNITS BEFORE Breakfast on exercise days and if eating eggs 8 Units on other days  LUNCH COVERAGE: NOVOLOG 8 UNITS if eating at home and 10 units for fast food  Dinner 6 units Novolog before eating  Crestor 20 mg daily to be started along with Zetia 10 mg daily  Check blood sugars 4 times a day especially after dinner  More consistent follow-up   Patient Instructions  NOVOLOG:  6 UNITS BEFORE Breakfast on exercise days and if eating eggs 8 Units on other days  LUNCH NOVOLOG 8 UNITS if eating at home and 10 units for fast food  Dinner 6 units Novolog for meals  Ozempic 0.25 mg weekly  Crestor 20 mg daily     Elayne Snare 03/07/2021, 8:56 PM   Note: This office note was prepared with Dragon voice recognition system technology. Any transcriptional errors that result from this process are unintentional.

## 2021-03-08 ENCOUNTER — Encounter (HOSPITAL_COMMUNITY)
Admission: RE | Admit: 2021-03-08 | Discharge: 2021-03-08 | Disposition: A | Discharge status: Home / Self Care | Payer: Medicare Other | Source: Ambulatory Visit | Attending: Cardiology | Admitting: Cardiology

## 2021-03-08 ENCOUNTER — Other Ambulatory Visit: Payer: Self-pay

## 2021-03-08 DIAGNOSIS — Z951 Presence of aortocoronary bypass graft: Secondary | ICD-10-CM

## 2021-03-08 DIAGNOSIS — I214 Non-ST elevation (NSTEMI) myocardial infarction: Secondary | ICD-10-CM | POA: Diagnosis not present

## 2021-03-08 NOTE — Progress Notes (Signed)
Mariah Peterson 75 y.o. female Nutrition Note  Diagnosis: NSTEMI, CABGx4   Past Medical History:  Diagnosis Date  . Chest pain 11/30/2020  . Coronary artery disease   . Diabetes mellitus without complication ([facility])   . Glaucoma   . History of chicken pox   . Hypertension      Medications reviewed.   Current Outpatient Medications:  .  aspirin EC 325 MG EC tablet, Take 1 tablet (325 mg total) by mouth daily., Disp: 30 tablet, Rfl: 0 .  calcium carbonate (OSCAL) 1500 (600 Ca) MG TABS tablet, Take 1 tablet by mouth 2 (two) times daily with a meal., Disp: , Rfl:  .  Continuous Blood Gluc Sensor (FREESTYLE LIBRE 14 DAY SENSOR) MISC, Inject 1 each into the skin every 14 (fourteen) days., Disp: 2 each, Rfl: 5 .  ezetimibe (ZETIA) 10 MG tablet, Take 1 tablet (10 mg total) by mouth daily., Disp: 90 tablet, Rfl: 3 .  glucose blood test strip, Use as instructed to test blood sugar 3 times daily E11.65, Disp: 270 each, Rfl: 1 .  insulin aspart (NOVOLOG FLEXPEN) 100 UNIT/ML FlexPen, Inject 6-8 Units into the skin 3 (three) times daily with meals. Inject 8 units in the morning, 6 units at lunch, Disp: 15 mL, Rfl: 3 .  insulin glargine (LANTUS SOLOSTAR) 100 UNIT/ML Solostar Pen, Inject 16 Units into the skin daily. INJECT 16 UNITS UNDER THE SKIN ONCE DAILY. (UPDATED DOSAGE), Disp: 15 mL, Rfl: 3 .  metoprolol tartrate (LOPRESSOR) 25 MG tablet, Take 0.5 tablets (12.5 mg total) by mouth 2 (two) times daily. (Patient not taking: Reported on 03/07/2021), Disp: 30 tablet, Rfl: 3 .  oxyCODONE (OXY IR/ROXICODONE) 5 MG immediate release tablet, TAKE 1 TABLET (5 MG TOTAL) BY MOUTH EVERY 4 (FOUR) HOURS AS NEEDED FOR SEVERE PAIN. (Patient not taking: No sig reported), Disp: 30 tablet, Rfl: 0 .  pregabalin (LYRICA) 25 MG capsule, Take 1 capsule (25 mg total) by mouth 2 (two) times daily., Disp: 60 capsule, Rfl: 0 .  rosuvastatin (CRESTOR) 20 MG tablet, Take 1 tablet (20 mg total) by mouth daily., Disp: 30 tablet, Rfl:  5  Current Facility-Administered Medications:  .  diclofenac Sodium (VOLTAREN) 1 % topical gel 2 g, 2 g, Topical, TID, Mariah Pinks M, PA-C   Ht Readings from Last 1 Encounters:  03/07/21 4\' 8"  (1.422 Peterson)     Wt Readings from Last 3 Encounters:  03/07/21 94 lb 3.2 oz (42.7 kg)  02/24/21 90 lb (40.8 kg)  02/23/21 90 lb 6.2 oz (41 kg)     There is no height or weight on file to calculate BMI.   Social History   Tobacco Use  Smoking Status Never Smoker  Smokeless Tobacco Never Used     Lab Results  Component Value Date   CHOL 163 03/03/2021   Lab Results  Component Value Date   HDL 66.80 03/03/2021   Lab Results  Component Value Date   LDLCALC 77 03/03/2021   Lab Results  Component Value Date   TRIG 97.0 03/03/2021     Lab Results  Component Value Date   HGBA1C 8.7 (H) 03/03/2021     CBG (last 3)  Recent Labs    03/06/21 1212  GLUCAP 77     Nutrition Note  Spoke with pt. Nutrition Plan and Nutrition Survey goals reviewed with pt. Pt is following a Heart Healthy diet. She lives with daughter who helps support Mariah Peterson in making heart healthy food choices.  Pt has Type 2 Diabetes. Last A1C improved to 8.7. Pt has freestyle14 day CGM. It does not have alarms.She has occasional hypoglycemia. She is unsure of why. This morning she was <70 mg/dl from 2 am to 6 am when she got up. She would benefit from updated CGM with alarms if insurance will cover it.   She states trying to scan CGM 3 times per day. CGM reports shows 1-3 scans per day. She often has missing data. We reviewed freestyle report. Pt was able to identify areas to improve. She wants to monitor CGM more often. She thinks she can do this when she sits down to eat.  Reviewed target ranges and time in range. Discussed ways to improve. Pt took 4 units insulin this morning with oatmeal. Post exercise her CGM was 96 mg/dl and dropping quickly. Discussed having snack before coming to exercise (breakfast is  4 hours prior to exercise).   Continuous glucose monitoring download:      Average is    177  for 14 days   Time sensor is active   68 %   Time in range (70-180 mg/dL):  49 % (Goal >70%)   Time High (181-250 mg/dL)  35 % (Goal < 25%)   Time Very High (>250 mg/dl)  3 % (Goal < 5%)   Time Low (54-69 mg/dL)  3 % (Goal is <4%)   Time Very Low (<54)  0%  (Goal <1%)   Coefficient of variation  35.3% (Goal is <36%)      Pt expressed understanding of the information reviewed.    Nutrition Diagnosis ? Excessive carbohydrate intake related to food preferences and lack of food related knowledge as evidenced by A1C 8.7, postprandial excursions >180 mg/dl   Nutrition Intervention ? Pt's individual nutrition plan reviewed with pt. ? Self monitoring by scanning CGM more often, consistent carb intake across day, weight maintenance/gain  ? Continue client-centered nutrition education by RD, as part of interdisciplinary care.  Goal(s) ? Pt to maintain or gain weight during cardiac rehab ? Pt to monitor CGM minimum 4 times per day  ? Pt to learn to estimate small, medium, and large carbohydrate meals for more consistent carb intake across day ? Improved blood glucose control as evidenced by pt's A1c trending from 8.7 toward less than 7.0.  Plan:   Will provide client-centered nutrition education as part of interdisciplinary care  Monitor and evaluate progress toward nutrition goal with team.   Mariah Offer, MS, RDN, LDN

## 2021-03-09 ENCOUNTER — Other Ambulatory Visit: Payer: Self-pay | Admitting: *Deleted

## 2021-03-09 MED ORDER — NOVOLOG FLEXPEN 100 UNIT/ML ~~LOC~~ SOPN: PEN_INJECTOR | SUBCUTANEOUS | 3 refills | Status: DC

## 2021-03-09 NOTE — Telephone Encounter (Signed)
New rx was sent in with instructions for meals

## 2021-03-10 ENCOUNTER — Encounter (HOSPITAL_COMMUNITY)
Admission: RE | Admit: 2021-03-10 | Discharge: 2021-03-10 | Disposition: A | Discharge status: Home / Self Care | Payer: Medicare Other | Source: Ambulatory Visit | Attending: Cardiology | Admitting: Cardiology

## 2021-03-10 ENCOUNTER — Encounter: Payer: Self-pay | Admitting: Thoracic Surgery (Cardiothoracic Vascular Surgery)

## 2021-03-10 ENCOUNTER — Telehealth (INDEPENDENT_AMBULATORY_CARE_PROVIDER_SITE_OTHER): Payer: Medicare Other | Admitting: Thoracic Surgery (Cardiothoracic Vascular Surgery)

## 2021-03-10 ENCOUNTER — Other Ambulatory Visit: Payer: Self-pay

## 2021-03-10 DIAGNOSIS — I214 Non-ST elevation (NSTEMI) myocardial infarction: Secondary | ICD-10-CM

## 2021-03-10 DIAGNOSIS — Z951 Presence of aortocoronary bypass graft: Secondary | ICD-10-CM

## 2021-03-10 NOTE — Progress Notes (Signed)
     [address]       [place] [address]             [phone]       Patient: Home Provider: Office Consent for Telemedicine visit obtained.  Today's visit was completed via a real-time telehealth (see specific modality noted below). The patient/authorized person provided oral consent at the time of the visit to engage in a telemedicine encounter with the present provider at [facility]. The patient/authorized person was informed of the potential benefits, limitations, and risks of telemedicine. The patient/authorized person expressed understanding that the laws that protect confidentiality also apply to telemedicine. The patient/authorized person acknowledged understanding that telemedicine does not provide emergency services and that he or she would need to call 911 or proceed to the nearest hospital for help if such a need arose.  . Total time spent in the clinical discussion 10 minutes. . Telehealth Modality: Phone visit (audio only)  I had a telephone visit with Mariah Peterson.  Since using the Lyrica paresthesias along the leg incisions have improved greatly.  I have instructed her to finish the prescription, and if she has any more nerve pain, she will give [place] a call.

## 2021-03-13 ENCOUNTER — Telehealth (HOSPITAL_COMMUNITY): Payer: Self-pay | Admitting: Internal Medicine

## 2021-03-13 ENCOUNTER — Telehealth (HOSPITAL_COMMUNITY): Payer: Self-pay | Admitting: *Deleted

## 2021-03-13 ENCOUNTER — Encounter (HOSPITAL_COMMUNITY): Payer: Medicare Other

## 2021-03-13 NOTE — Telephone Encounter (Signed)
Patient left message on department voicemail that she would be absent from cardiac rehab today due to [place] exposure. Will notify patient's nurse case manager for follow-up.  Sol Passer, MS, ACSM CEP

## 2021-03-14 NOTE — Progress Notes (Signed)
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  aspirin EC 325 MG EC tablet, Take 1 tablet (325 mg total) by mouth daily., Disp: 30 tablet, Rfl: 0 .  calcium carbonate (OSCAL) 1500 (600 Ca) MG TABS tablet, Take 1 tablet by mouth 2 (two) times daily with a meal., Disp: , Rfl:  .  Continuous Blood Gluc Sensor (FREESTYLE LIBRE 14 DAY SENSOR) MISC, Inject 1 each into the skin every 14 (fourteen) days., Disp: 2 each, Rfl: 5 .  ezetimibe (ZETIA) 10 MG tablet, Take 1 tablet (10 mg total) by mouth daily., Disp: 90 tablet, Rfl: 3 .  glucose blood test strip, Use as instructed to test blood sugar 3 times daily E11.65, Disp: 270 each, Rfl: 1 .  insulin aspart (NOVOLOG FLEXPEN) 100 UNIT/ML FlexPen, 6 UNITS BEFORE Breakfast on exercise days and if eating eggs 8 Units on other days LUNCH COVERAGE: NOVOLOG 8 UNITS if eating at home and 10 units for fast food Dinner 6 units Novolog before eating, Disp: 15 mL, Rfl: 3 .  insulin glargine (LANTUS SOLOSTAR) 100 UNIT/ML Solostar Pen, Inject 16 Units into the skin daily. INJECT 16 UNITS UNDER THE SKIN ONCE DAILY. (UPDATED DOSAGE), Disp: 15 mL, Rfl: 3 .  metoprolol tartrate (LOPRESSOR) 25 MG tablet, Take 0.5 tablets (12.5 mg total) by mouth 2 (two) times daily. (Patient not taking: Reported on 03/07/2021), Disp: 30 tablet, Rfl: 3 .  oxyCODONE (OXY IR/ROXICODONE) 5 MG immediate release tablet, TAKE 1 TABLET (5 MG TOTAL) BY MOUTH EVERY 4 (FOUR)  HOURS AS NEEDED FOR SEVERE PAIN. (Patient not taking: No sig reported), Disp: 30 tablet, Rfl: 0 .  pregabalin (LYRICA) 25 MG capsule, Take 1 capsule (25 mg total) by mouth 2 (two) times daily., Disp: 60 capsule, Rfl: 0 .  rosuvastatin (CRESTOR) 20 MG tablet, Take 1 tablet (20 mg total) by mouth daily., Disp: 30 tablet, Rfl: 5  Current Facility-Administered Medications:  .  diclofenac Sodium (VOLTAREN) 1 % topical gel 2 g, 2 g, Topical, TID, Nani Skillern, PA-C  Past Medical History: Past Medical History:  Diagnosis Date  . Chest pain 11/30/2020  . Coronary artery disease   . Diabetes mellitus without complication ([place])   . Glaucoma   . History of chicken pox   . Hypertension     Tobacco Use: Social History   Tobacco Use  Smoking Status Never Smoker  Smokeless Tobacco Never Used    Labs: Recent Review Flowsheet Data    Labs for ITP Cardiac and Pulmonary Rehab Latest Ref Rng & Units 12/05/2020 12/05/2020 12/05/2020 12/06/2020 03/03/2021   Cholestrol 0 - 200 mg/dL - - - - 163   LDLCALC 0 - 99 mg/dL - - - - 77   HDL >39.00 mg/dL - - - - 66.80   Trlycerides 0.0 - 149.0 mg/dL - - - - 97.0   Hemoglobin A1c 4.6 - 6.5 % - - - - 8.7(H)   PHART 7.350 - 7.450 - 7.441  7.358 7.404 -   PCO2ART 32.0 - 48.0 mmHg - 33.7 38.1 28.9(L) -   HCO3 20.0 - 28.0 mmol/L - 23.0 21.6 18.1(L) -   TCO2 22 - 32 mmol/L _0 19(L) -   ACIDBASEDEF 0.0 - 2.0 mmol/L - 1.0 4.0(H) 6.0(H) -   O2SAT % - 100.0 93.0 98.0 -      Capillary Blood Glucose: Lab Results  Component Value Date   GLUCAP 77 03/06/2021   GLUCAP 112 (H) 03/01/2021   GLUCAP 176 (H) 03/01/2021   GLUCAP 106 (H) 02/27/2021   GLUCAP 105 (H) 12/09/2020     Exercise Target Goals: Exercise Program Goal: Individual exercise prescription set using results from initial 6 min walk test and THRR while considering  patient's activity barriers and safety.   Exercise Prescription Goal: Initial exercise prescription builds to 30-45 minutes  a day of aerobic activity, 2-3 days per week.  Home exercise guidelines will be given to patient during program as part of exercise prescription that the participant will acknowledge.  Activity Barriers & Risk Stratification:  Activity Barriers & Cardiac Risk Stratification - 02/23/21 1137      Activity Barriers & Cardiac Risk Stratification   Activity Barriers Arthritis;Balance Concerns;Incisional Pain    Cardiac Risk Stratification High   Achieved under 5 MET's          6 Minute Walk:  6 Minute Walk    Row Name 02/23/21 1136         6 Minute Walk   Distance 1461 feet     Walk Time 6 minutes     # of Rest Breaks 0     MPH 2.77     METS 2.04     RPE 11     Perceived Dyspnea  0     VO2 Peak 10.63     Symptoms No     Resting HR 80 bpm     Resting BP 142/70     Resting Oxygen Saturation  98 %     Exercise Oxygen Saturation  during 6 min walk 100 %     Max Ex. HR 90 bpm     Max Ex. BP 138/72     2 Minute Post BP 134/68            Oxygen Initial Assessment:   Oxygen Re-Evaluation:   Oxygen Discharge (Final Oxygen Re-Evaluation):   Initial Exercise Prescription:  Initial Exercise Prescription - 02/23/21 1100      Date of Initial Exercise RX and Referring Provider   Date 02/23/21    Referring Provider Dr Candee Furbish MD    Expected Discharge Date 04/21/21      Recumbant Bike   Level 1.5    RPM 80    Minutes 15    METs 2.3      NuStep   Level 2    SPM 80    Minutes 15    METs 2.3      Prescription Details   Frequency (times per week) 3    Duration Progress to 30 minutes of continuous aerobic without signs/symptoms of physical distress      Intensity   THRR 40-80% of Max Heartrate 59-118    Ratings of Perceived Exertion 11-13    Perceived Dyspnea 0-4      Progression   Progression Continue progressive overload as per policy without signs/symptoms or physical distress.      Resistance Training   Training Prescription Yes    Weight  3    Reps  10-15           Perform Capillary Blood Glucose checks as needed.  Exercise Prescription Changes:   Exercise Prescription Changes    Row Name 03/01/21 1115 03/10/21 1105           Response to Exercise   Blood Pressure (Admit) 150/82 128/60      Blood Pressure (Exercise) 162/80 148/78      Blood Pressure (Exit) 140/80 138/82      Heart Rate (Admit) 76 bpm 87 bpm      Heart Rate (Exercise) 111 bpm 121 bpm      Heart Rate (Exit) 81 bpm 87 bpm      Rating of Perceived Exertion (Exercise) 11 12      Symptoms none none      Comments Off to a good start with exercise. --      Duration Continue with 30 min of aerobic exercise without signs/symptoms of physical distress. Continue with 30 min of aerobic exercise without signs/symptoms of physical distress.      Intensity THRR unchanged THRR unchanged             Progression   Progression Continue to progress workloads to maintain intensity without signs/symptoms of physical distress. Continue to progress workloads to maintain intensity without signs/symptoms of physical distress.      Average METs 2.7 2.5             Resistance Training   Training Prescription No Yes      Weight -- 2 lbs      Reps -- 10-15      Time -- 10 Minutes             Interval Training   Interval Training No No             Recumbant Bike   Level 1.5 1.5      Minutes 15 15      METs 2.9 2.5             NuStep   Level 2 2      SPM 85 85      Minutes 15 15      METs 2.5 2.6             Exercise Comments:   Exercise Comments    Row Name 03/01/21 1214           Exercise Comments Patient tolerated first session of exercise well without symptoms.              Exercise Goals and Review:   Exercise Goals    Row Name 02/23/21 1145             Exercise Goals   Increase Physical Activity Yes       Intervention Provide advice, education, support and counseling about physical activity/exercise needs.;Develop an individualized  exercise prescription for aerobic and resistive training based on initial evaluation findings, risk stratification, comorbidities and participant's personal goals.       Expected Outcomes Short Term: Attend rehab on a regular basis to increase amount of physical activity.;Long Term: Add in home exercise to make exercise part of routine and to increase amount of physical activity.;Long Term: Exercising regularly at least 3-5 days a week.       Increase Strength and Stamina Yes       Intervention Provide advice, education, support and counseling about physical activity/exercise needs.;Develop an individualized exercise prescription for aerobic  and resistive training based on initial evaluation findings, risk stratification, comorbidities and participant's personal goals.       Expected Outcomes Short Term: Increase workloads from initial exercise prescription for resistance, speed, and METs.;Short Term: Perform resistance training exercises routinely during rehab and add in resistance training at home;Long Term: Improve cardiorespiratory fitness, muscular endurance and strength as measured by increased METs and functional capacity (6MWT)       Able to understand and use rate of perceived exertion (RPE) scale Yes       Intervention Provide education and explanation on how to use RPE scale       Expected Outcomes Short Term: Able to use RPE daily in rehab to express subjective intensity level;Long Term:  Able to use RPE to guide intensity level when exercising independently       Knowledge and understanding of Target Heart Rate Range (THRR) Yes       Intervention Provide education and explanation of THRR including how the numbers were predicted and where they are located for reference       Expected Outcomes Short Term: Able to state/look up THRR;Long Term: Able to use THRR to govern intensity when exercising independently;Short Term: Able to use daily as guideline for intensity in rehab       Understanding  of Exercise Prescription Yes       Intervention Provide education, explanation, and written materials on patient's individual exercise prescription       Expected Outcomes Short Term: Able to explain program exercise prescription;Long Term: Able to explain home exercise prescription to exercise independently              Exercise Goals Re-Evaluation :  Exercise Goals Re-Evaluation    [place] Name 03/01/21 1214 03/13/21 1701           Exercise Goal Re-Evaluation   Exercise Goals Review Increase Physical Activity;Able to understand and use rate of perceived exertion (RPE) scale --      Comments Patient able to understand and use RPE scale appropriately. Patient called out today due to COVID 19 exposeure, unable to review goals.      Expected Outcomes Increase workloads as tolerated to help increase strength and stamina. Will review goals with patient upon return to exercise.             Discharge Exercise Prescription (Final Exercise Prescription Changes):  Exercise Prescription Changes - 03/10/21 1105      Response to Exercise   Blood Pressure (Admit) 128/60    Blood Pressure (Exercise) 148/78    Blood Pressure (Exit) 138/82    Heart Rate (Admit) 87 bpm    Heart Rate (Exercise) 121 bpm    Heart Rate (Exit) 87 bpm    Rating of Perceived Exertion (Exercise) 12    Symptoms none    Duration Continue with 30 min of aerobic exercise without signs/symptoms of physical distress.    Intensity THRR unchanged      Progression   Progression Continue to progress workloads to maintain intensity without signs/symptoms of physical distress.    Average METs 2.5      Resistance Training   Training Prescription Yes    Weight 2 lbs    Reps 10-15    Time 10 Minutes      Interval Training   Interval Training No      Recumbant Bike   Level 1.5    Minutes 15    METs 2.5      NuStep  Level 2    SPM 85    Minutes 15    METs 2.6           Nutrition:  Target Goals: Understanding  of nutrition guidelines, daily intake of sodium <1533m, cholesterol <2010m calories 30% from fat and 7% or less from saturated fats, daily to have 5 or more servings of fruits and vegetables.  Biometrics:  Pre Biometrics - 02/23/21 1133      Pre Biometrics   Waist Circumference 30.25 inches    Hip Circumference 33.5 inches    Waist to Hip Ratio 0.9 %    Triceps Skinfold 29 mm    % Body Fat 35 %    Grip Strength 24 kg    Flexibility 13 in    Single Leg Stand 5 seconds            Nutrition Therapy Plan and Nutrition Goals:  Nutrition Therapy & Goals - 03/08/21 1424      Nutrition Therapy   Diet TLC; carb modified      Personal Nutrition Goals   Nutrition Goal Pt to maintain or gain weight during cardiac rehab    Personal Goal #2 Pt to monitor CGM minimum 4 times per day    Personal Goal #3 Pt to learn to estimate small, medium, and large carbohydrate meals for more consistent carb intake across day    Personal Goal #4 Improved blood glucose control as evidenced by pt's A1c trending from 8.7 toward less than 7.0.      Intervention Plan   Intervention Prescribe, educate and counsel regarding individualized specific dietary modifications aiming towards targeted core components such as weight, hypertension, lipid management, diabetes, heart failure and other comorbidities.;Nutrition handout(s) given to patient.    Expected Outcomes Long Term Goal: Adherence to prescribed nutrition plan.;Short Term Goal: A plan has been developed with personal nutrition goals set during dietitian appointment.           Nutrition Assessments:  MEDIFICTS Score Key:  ?70 Need to make dietary changes   40-70 Heart Healthy Diet  ? 40 Therapeutic Level Cholesterol Diet   Flowsheet Row CARDIAC REHAB PHASE II [facility] Total [facility] on [facility] 65     Picture Your Plate Scores:  <4<19nhealthy dietary pattern with  much room for improvement.  41-50 Dietary pattern unlikely to meet recommendations for good health and room for improvement.  51-60 More healthful dietary pattern, with some room for improvement.   >60 Healthy dietary pattern, although there may be some specific behaviors that could be improved.    Nutrition Goals Re-Evaluation:  Nutrition Goals Re-Evaluation    [place] 03/08/21 1427             Goals   Current Weight 94 lb (42.6 kg)       Nutrition Goal Pt to maintain or gain weight during cardiac rehab               Personal Goal #2 Re-Evaluation   Personal Goal #2 Pt to monitor CGM minimum 4 times per day               Personal Goal #3 Re-Evaluation   Personal Goal #3 Pt to learn to estimate small, medium, and large carbohydrate meals for more consistent carb intake across day               Personal Goal #4 Re-Evaluation   Personal  Goal #4 Improved blood glucose control as evidenced by pt's A1c trending from 8.7 toward less than 7.0.              Nutrition Goals Re-Evaluation:  Nutrition Goals Re-Evaluation    [place] Name 03/08/21 1427             Goals   Current Weight 94 lb (42.6 kg)       Nutrition Goal Pt to maintain or gain weight during cardiac rehab               Personal Goal #2 Re-Evaluation   Personal Goal #2 Pt to monitor CGM minimum 4 times per day               Personal Goal #3 Re-Evaluation   Personal Goal #3 Pt to learn to estimate small, medium, and large carbohydrate meals for more consistent carb intake across day               Personal Goal #4 Re-Evaluation   Personal Goal #4 Improved blood glucose control as evidenced by pt's A1c trending from 8.7 toward less than 7.0.              Nutrition Goals Discharge (Final Nutrition Goals Re-Evaluation):  Nutrition Goals Re-Evaluation - 03/08/21 1427      Goals   Current Weight 94 lb (42.6 kg)    Nutrition Goal Pt to maintain or gain weight during cardiac rehab      Personal Goal #2  Re-Evaluation   Personal Goal #2 Pt to monitor CGM minimum 4 times per day      Personal Goal #3 Re-Evaluation   Personal Goal #3 Pt to learn to estimate small, medium, and large carbohydrate meals for more consistent carb intake across day      Personal Goal #4 Re-Evaluation   Personal Goal #4 Improved blood glucose control as evidenced by pt's A1c trending from 8.7 toward less than 7.0.           Psychosocial: Target Goals: Acknowledge presence or absence of significant depression and/or stress, maximize coping skills, provide positive support system. Participant is able to verbalize types and ability to use techniques and skills needed for reducing stress and depression.  Initial Review & Psychosocial Screening:  Initial Psych Review & Screening - 02/23/21 1129      Initial Review   Current issues with None Identified      Family Dynamics   Good Support System? Yes   Ciani has her husband, daughter and grandson for support     Barriers   Psychosocial barriers to participate in program There are no identifiable barriers or psychosocial needs.      Screening Interventions   Interventions Encouraged to exercise           Quality of Life Scores:  Quality of Life - 02/23/21 1147      Quality of Life   Select Quality of Life      Quality of Life Scores   Health/Function Pre 25.6 %    Socioeconomic Pre 26.57 %    Psych/Spiritual Pre 23.14 %    Family Pre 25.2 %    GLOBAL Pre 25.24 %          Scores of 19 and below usually indicate a poorer quality of life in these areas.  A difference of  2-3 points is a clinically meaningful difference.  A difference of 2-3 points in the total score of the Quality of Life  Index has been associated with significant improvement in overall quality of life, self-image, physical symptoms, and general health in studies assessing change in quality of life.  PHQ-9: Recent Review Flowsheet Data    Depression screen [facility] 2/9 02/23/2021 02/23/2021  10/19/2020 10/10/2020 09/30/2019   Decreased Interest 0 0 0 0 0   Down, Depressed, Hopeless 0 0 0 0 0   PHQ - 2 Score 0 0 0 0 0   Altered sleeping - - - - 1   Tired, decreased energy - - - - 1   Change in appetite - - - - 1   Feeling bad or failure about yourself  - - - - 0   Trouble concentrating - - - - 0   Moving slowly or fidgety/restless - - - - 0   Suicidal thoughts - - - - 0   PHQ-9 Score - - - - 3   Difficult doing work/chores - - - - Not difficult at all     Interpretation of Total Score  Total Score Depression Severity:  1-4 = Minimal depression, 5-9 = Mild depression, 10-14 = Moderate depression, 15-19 = Moderately severe depression, 20-27 = Severe depression   Psychosocial Evaluation and Intervention:   Psychosocial Re-Evaluation:  Psychosocial Re-Evaluation    [place] Name 03/01/21 1545 03/14/21 1145           Psychosocial Re-Evaluation   Current issues with None Identified None Identified      Interventions Encouraged to attend Cardiac Rehabilitation for the exercise Encouraged to attend Cardiac Rehabilitation for the exercise      Continue Psychosocial Services  No Follow up required No Follow up required             Psychosocial Discharge (Final Psychosocial Re-Evaluation):  Psychosocial Re-Evaluation - 03/14/21 1145      Psychosocial Re-Evaluation   Current issues with None Identified    Interventions Encouraged to attend Cardiac Rehabilitation for the exercise    Continue Psychosocial Services  No Follow up required           Vocational Rehabilitation: Provide vocational rehab assistance to qualifying candidates.   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 Participant will state understanding/return demonstration of topics presented.  Learning Barriers/Preferences:   Education Topics: Count Your Pulse:  -Group instruction provided by verbal instruction, demonstration, patient participation and written materials to support subject.  Instructors address importance of being able to find your pulse and how to count your pulse when at home without a heart monitor.  Patients get hands on experience counting their pulse with staff help and individually.   Heart Attack, Angina, and Risk Factor Modification:  -Group instruction provided by verbal instruction, video, and written materials to support subject.  Instructors address signs and symptoms of angina and heart attacks.    Also discuss risk factors for heart disease and how to make changes to improve heart health risk factors.   Functional Fitness:  -Group instruction provided by verbal instruction, demonstration, patient participation, and written materials to support subject.  Instructors address safety measures for doing things around the house.  Discuss how to  get up and down off the floor, how to pick things up properly, how to safely get out of a chair without assistance, and balance training.   Meditation and Mindfulness:  -Group instruction provided by verbal instruction, patient participation, and written materials to support subject.  Instructor addresses importance of mindfulness and meditation practice to help reduce stress and improve awareness.  Instructor also leads participants through a meditation exercise.    Stretching for Flexibility and Mobility:  -Group instruction provided by verbal instruction, patient participation, and written materials to support subject.  Instructors lead participants through series of stretches that are designed to increase flexibility thus improving mobility.  These stretches are additional exercise  for major muscle groups that are typically performed during regular warm up and cool down.   Hands Only CPR:  -Group verbal, video, and participation provides a basic overview of AHA guidelines for community CPR. Role-play of emergencies allow participants the opportunity to practice calling for help and chest compression technique with discussion of AED use.   Hypertension: -Group verbal and written instruction that provides a basic overview of hypertension including the most recent diagnostic guidelines, risk factor reduction with self-care instructions and medication management.    Nutrition I class: Heart Healthy Eating:  -Group instruction provided by PowerPoint slides, verbal discussion, and written materials to support subject matter. The instructor gives an explanation and review of the Therapeutic Lifestyle Changes diet recommendations, which includes a discussion on lipid goals, dietary fat, sodium, fiber, plant stanol/sterol esters, sugar, and the components of a well-balanced, healthy diet.   Nutrition II class: Lifestyle Skills:  -Group instruction provided by PowerPoint slides, verbal discussion, and written materials to support subject matter. The instructor gives an explanation and review of label reading, grocery shopping for heart health, heart healthy recipe modifications, and ways to make healthier choices when eating out.   Diabetes Question & Answer:  -Group instruction provided by PowerPoint slides, verbal discussion, and written materials to support subject matter. The instructor gives an explanation and review of diabetes co-morbidities, pre- and post-prandial blood glucose goals, pre-exercise blood glucose goals, signs, symptoms, and treatment of hypoglycemia and hyperglycemia, and foot care basics.   Diabetes Blitz:  -Group instruction provided by PowerPoint slides, verbal discussion, and written materials to support subject matter. The instructor gives an  explanation and review of the physiology behind type 1 and type 2 diabetes, diabetes medications and rational behind using different medications, pre- and post-prandial blood glucose recommendations and Hemoglobin A1c goals, diabetes diet, and exercise including blood glucose guidelines for exercising safely.    Portion Distortion:  -Group instruction provided by PowerPoint slides, verbal discussion, written materials, and food models to support subject matter. The instructor gives an explanation of serving size versus portion size, changes in portions sizes over the last 20 years, and what consists of a serving from each food group.   Stress Management:  -Group instruction provided by verbal instruction, video, and written materials to support subject matter.  Instructors review role of stress in heart disease and how to cope with stress positively.     Exercising on Your Own:  -Group instruction provided by verbal instruction, power point, and written materials to support subject.  Instructors discuss benefits of exercise, components of exercise, frequency and intensity of exercise, and end points for exercise.  Also discuss use of nitroglycerin and activating EMS.  Review options of places to exercise outside of rehab.  Review guidelines for sex with heart disease.   Cardiac Drugs  I:  -Group instruction provided by verbal instruction and written materials to support subject.  Instructor reviews cardiac drug classes: antiplatelets, anticoagulants, beta blockers, and statins.  Instructor discusses reasons, side effects, and lifestyle considerations for each drug class.   Cardiac Drugs II:  -Group instruction provided by verbal instruction and written materials to support subject.  Instructor reviews cardiac drug classes: angiotensin converting enzyme inhibitors (ACE-I), angiotensin II receptor blockers (ARBs), nitrates, and calcium channel blockers.  Instructor discusses reasons, side effects,  and lifestyle considerations for each drug class.   Anatomy and Physiology of the Circulatory System:  Group verbal and written instruction and models provide basic cardiac anatomy and physiology, with the coronary electrical and arterial systems. Review of: AMI, Angina, Valve disease, Heart Failure, Peripheral Artery Disease, Cardiac Arrhythmia, Pacemakers, and the ICD.   Other Education:  -Group or individual verbal, written, or video instructions that support the educational goals of the cardiac rehab program.   Holiday Eating Survival Tips:  -Group instruction provided by PowerPoint slides, verbal discussion, and written materials to support subject matter. The instructor gives patients tips, tricks, and techniques to help them not only survive but enjoy the holidays despite the onslaught of food that accompanies the holidays.   Knowledge Questionnaire Score:  Knowledge Questionnaire Score - 02/23/21 1148      Knowledge Questionnaire Score   Pre Score 20/24           Core Components/Risk Factors/Patient Goals at Admission:  Personal Goals and Risk Factors at Admission - 02/23/21 1148      Core Components/Risk Factors/Patient Goals on Admission    Weight Management Weight Maintenance;Yes    Intervention Weight Management: Develop a combined nutrition and exercise program designed to reach desired caloric intake, while maintaining appropriate intake of nutrient and fiber, sodium and fats, and appropriate energy expenditure required for the weight goal.;Weight Management: Provide education and appropriate resources to help participant work on and attain dietary goals.    Expected Outcomes Short Term: Continue to assess and modify interventions until short term weight is achieved;Long Term: Adherence to nutrition and physical activity/exercise program aimed toward attainment of established weight goal;Weight Maintenance: Understanding of the daily nutrition guidelines, which includes  25-35% calories from fat, 7% or less cal from saturated fats, less than 271m cholesterol, less than 1.5gm of sodium, & 5 or more servings of fruits and vegetables daily    Diabetes Yes    Intervention Provide education about signs/symptoms and action to take for hypo/hyperglycemia.;Provide education about proper nutrition, including hydration, and aerobic/resistive exercise prescription along with prescribed medications to achieve blood glucose in normal ranges: Fasting glucose 65-99 mg/dL    Expected Outcomes Short Term: Participant verbalizes understanding of the signs/symptoms and immediate care of hyper/hypoglycemia, proper foot care and importance of medication, aerobic/resistive exercise and nutrition plan for blood glucose control.;Long Term: Attainment of HbA1C < 7%.    Hypertension Yes    Intervention Provide education on lifestyle modifcations including regular physical activity/exercise, weight management, moderate sodium restriction and increased consumption of fresh fruit, vegetables, and low fat dairy, alcohol moderation, and smoking cessation.;Monitor prescription use compliance.    Expected Outcomes Short Term: Continued assessment and intervention until BP is < 140/916mHG in hypertensive participants. < 130/8078mG in hypertensive participants with diabetes, heart failure or chronic kidney disease.;Long Term: Maintenance of blood pressure at goal levels.    Lipids Yes    Intervention Provide education and support for participant on nutrition & aerobic/resistive exercise along with prescribed  medications to achieve LDL <69m, HDL >491m    Expected Outcomes Short Term: Participant states understanding of desired cholesterol values and is compliant with medications prescribed. Participant is following exercise prescription and nutrition guidelines.;Long Term: Cholesterol controlled with medications as prescribed, with individualized exercise RX and with personalized nutrition plan. Value  goals: LDL < 7053mHDL > 40 mg.    Stress Yes    Intervention Offer individual and/or small group education and counseling on adjustment to heart disease, stress management and health-related lifestyle change. Teach and support self-help strategies.;Refer participants experiencing significant psychosocial distress to appropriate mental health specialists for further evaluation and treatment. When possible, include family members and significant others in education/counseling sessions.    Expected Outcomes Short Term: Participant demonstrates changes in health-related behavior, relaxation and other stress management skills, ability to obtain effective social support, and compliance with psychotropic medications if prescribed.;Long Term: Emotional wellbeing is indicated by absence of clinically significant psychosocial distress or social isolation.    Personal Goal Other Yes    Personal Goal Pt wants to feel safe and independant. She also wants to regain strength and feel good about her heart health.    Intervention Will progress workloads as tolerated without sign or symptom    Expected Outcomes Pt will achieve her goals within the [facility] and feel better about her health           Core Components/Risk Factors/Patient Goals Review:   Goals and Risk Factor Review    Row Name 03/01/21 1545 03/14/21 1145           Core Components/Risk Factors/Patient Goals Review   Personal Goals Review Weight Management/Obesity;Stress;Hypertension;Lipids;Diabetes Weight Management/Obesity;Stress;Hypertension;Lipids;Diabetes      Review Shernell started exercise on 03/08/67/08stolic BP's were in the 140's to 160's. CBG's were stable today Kamie continues to do well with exercise. Nadiya has had some variations in her CBG's and BP's will continue to monitor. Ashlyn's insulin has been decreased by Dr KumDwyane Dee   Expected Outcomes Vernal will continue to participate in phase 2 cardiac rehab for exercise, nutrtion and  lifestyle modifications Lili will continue to participate in phase 2 cardiac rehab for exercise, nutrtion and lifestyle modifications             Core Components/Risk Factors/Patient Goals at Discharge (Final Review):   Goals and Risk Factor Review - 03/14/21 1145      Core Components/Risk Factors/Patient Goals Review   Personal Goals Review Weight Management/Obesity;Stress;Hypertension;Lipids;Diabetes    Review Marianna continues to do well with exercise. Kynsleigh has had some variations in her CBG's and BP's will continue to monitor. Rula's insulin has been decreased by Dr KumDwyane Dee Expected Outcomes Jemma will continue to participate in phase 2 cardiac rehab for exercise, nutrtion and lifestyle modifications           ITP Comments:  ITP Comments    Row Name 02/23/21 1045 03/01/21 1543 03/14/21 1143       ITP Comments Dr TraFransico Him, Medical Director 30 Day ITP Review. Shatia started exercise on 03/01/21 and did well with exercise. 30 Day ITP Review. Doryce has been doing well with exercise. Itsel is currently out from cardiac rehab due to a Covid 19 exposure. Awaiting Covid 19 testing results            Comments: See ITP Comments

## 2021-03-15 ENCOUNTER — Encounter (HOSPITAL_COMMUNITY): Payer: Medicare Other

## 2021-03-17 ENCOUNTER — Encounter (HOSPITAL_COMMUNITY): Payer: Medicare Other

## 2021-03-17 ENCOUNTER — Telehealth (HOSPITAL_COMMUNITY): Payer: Self-pay | Admitting: Internal Medicine

## 2021-03-17 ENCOUNTER — Telehealth (HOSPITAL_COMMUNITY): Payer: Self-pay | Admitting: *Deleted

## 2021-03-22 ENCOUNTER — Encounter: Payer: Self-pay | Admitting: Cardiology

## 2021-03-22 ENCOUNTER — Ambulatory Visit (INDEPENDENT_AMBULATORY_CARE_PROVIDER_SITE_OTHER): Payer: Medicare Other | Admitting: Cardiology

## 2021-03-22 ENCOUNTER — Other Ambulatory Visit: Payer: Self-pay

## 2021-03-22 ENCOUNTER — Encounter (HOSPITAL_COMMUNITY): Payer: Medicare Other

## 2021-03-22 VITALS — BP 140/70 | HR 85 | Ht <= 58 in | Wt 95.0 lb

## 2021-03-22 DIAGNOSIS — I2510 Atherosclerotic heart disease of native coronary artery without angina pectoris: Secondary | ICD-10-CM | POA: Diagnosis not present

## 2021-03-22 DIAGNOSIS — E782 Mixed hyperlipidemia: Secondary | ICD-10-CM | POA: Diagnosis not present

## 2021-03-22 DIAGNOSIS — Z951 Presence of aortocoronary bypass graft: Secondary | ICD-10-CM | POA: Diagnosis not present

## 2021-03-22 DIAGNOSIS — E0800 Diabetes mellitus due to underlying condition with hyperosmolarity without nonketotic hyperglycemic-hyperosmolar coma (NKHHC): Secondary | ICD-10-CM

## 2021-03-22 DIAGNOSIS — I214 Non-ST elevation (NSTEMI) myocardial infarction: Secondary | ICD-10-CM

## 2021-03-22 MED ORDER — ASPIRIN EC 81 MG PO TBEC: 81.0000 mg | DELAYED_RELEASE_TABLET | Freq: Every day | ORAL | 3 refills | Status: AC

## 2021-03-22 NOTE — Progress Notes (Signed)
Cardiology Office Note:    Date:  03/22/2021   ID:  Mariah, Peterson Dec 15, 1945, MRN [id number]  PCP:  Isaac Bliss, Rayford Halsted, MD   [facility] Providers Cardiologist:  Candee Furbish, MD     Referring MD: Isaac Bliss, Estel*     History of Present Illness:    Mariah Peterson is a 75 y.o. female here for follow-up of coronary disease status post CABG, non-STEMI.  Had some hypersensitivity over skin lesions, she was given a prescription by Dr. Kipp Brood for Lyrica.  Overall doing very well.  Feeling much better.  Enjoying cardiac rehab.  Past Medical History:  Diagnosis Date  . Chest pain 11/30/2020  . Coronary artery disease   . Diabetes mellitus without complication ([place])   . Glaucoma   . History of chicken pox   . Hypertension     Past Surgical History:  Procedure Laterality Date  . BREAST BIOPSY Right 2017   benign  . CARDIAC CATHETERIZATION    . CATARACT EXTRACTION, BILATERAL Bilateral   . CESAREAN SECTION    . COLONOSCOPY  1980's  . CORONARY ARTERY BYPASS GRAFT N/A 12/05/2020   Procedure: CORONARY ARTERY BYPASS GRAFTING (CABG), ON PUMP, TIMES FOUR, USING LEFT INTERNAL MAMMARY ARTERY AND BILATERAL ENDOSCOPICALLY HARVESTED GREATER SAPHENOUS VEINS;  Surgeon: [name], MD;  Location: [place];  Service: Open Heart Surgery;  Laterality: N/A;  . LEFT HEART CATH AND CORONARY ANGIOGRAPHY N/A 11/30/2020   Procedure: LEFT HEART CATH AND CORONARY ANGIOGRAPHY;  Surgeon: Belva Crome, MD;  Location: [facility];  Service: Cardiovascular;  Laterality: N/A;  . TEE WITHOUT CARDIOVERSION N/A 12/05/2020   Procedure: TRANSESOPHAGEAL ECHOCARDIOGRAM (TEE);  Surgeon: [name], MD;  Location: [place];  Service: Open Heart Surgery;  Laterality: N/A;  . TUBAL LIGATION      Current Medications: Current Meds  Medication Sig  . aspirin EC 81 MG tablet Take 1 tablet (81 mg total) by mouth daily. Swallow whole.  . calcium carbonate (OSCAL) 1500 (600 Ca) MG  TABS tablet Take 1 tablet by mouth 2 (two) times daily with a meal.  . Continuous Blood Gluc Sensor (FREESTYLE LIBRE 14 DAY SENSOR) MISC Inject 1 each into the skin every 14 (fourteen) days.  Marland Kitchen ezetimibe (ZETIA) 10 MG tablet Take 1 tablet (10 mg total) by mouth daily.  Marland Kitchen glucose blood test strip Use as instructed to test blood sugar 3 times daily E11.65  . insulin aspart (NOVOLOG FLEXPEN) 100 UNIT/ML FlexPen 6 UNITS BEFORE Breakfast on exercise days and if eating eggs 8 Units on other days LUNCH COVERAGE: NOVOLOG 8 UNITS if eating at home and 10 units for fast food Dinner 6 units Novolog before eating  . insulin glargine (LANTUS SOLOSTAR) 100 UNIT/ML Solostar Pen Inject 16 Units into the skin daily. INJECT 16 UNITS UNDER THE SKIN ONCE DAILY. (UPDATED DOSAGE)  . metoprolol tartrate (LOPRESSOR) 25 MG tablet Take 0.5 tablets (12.5 mg total) by mouth 2 (two) times daily.  Marland Kitchen oxyCODONE (OXY IR/ROXICODONE) 5 MG immediate release tablet TAKE 1 TABLET (5 MG TOTAL) BY MOUTH EVERY 4 (FOUR) HOURS AS NEEDED FOR SEVERE PAIN.  Marland Kitchen pregabalin (LYRICA) 25 MG capsule Take 1 capsule (25 mg total) by mouth 2 (two) times daily.  . rosuvastatin (CRESTOR) 20 MG tablet Take 1 tablet (20 mg total) by mouth daily.  . [DISCONTINUED] aspirin EC 325 MG EC tablet Take 1 tablet (325 mg total) by mouth daily.   Current Facility-Administered Medications for  the 03/22/21 encounter (Office Visit) with Jerline Pain, MD  Medication  . diclofenac Sodium (VOLTAREN) 1 % topical gel 2 g     Allergies:   Patient has no known allergies.   Social History   Socioeconomic History  . Marital status: Married    Spouse name: Not on file  . Number of children: 1  . Years of education: 63  . Highest education level: High school graduate  Occupational History  . Occupation: Retired  Tobacco Use  . Smoking status: Never Smoker  . Smokeless tobacco: Never Used  Vaping Use  . Vaping Use: Never used  Substance and Sexual Activity  .  Alcohol use: No  . Drug use: No  . Sexual activity: Yes  Other Topics Concern  . Not on file  Social History Narrative  . Not on file   Social Determinants of Health   Financial Resource Strain: Low Risk   . Difficulty of Paying Living Expenses: Not hard at all  Food Insecurity: No Food Insecurity  . Worried About Charity fundraiser in the Last Year: Never true  . Ran Out of Food in the Last Year: Never true  Transportation Needs: No Transportation Needs  . Lack of Transportation (Medical): No  . Lack of Transportation (Non-Medical): No  Physical Activity: Sufficiently Active  . Days of Exercise per Week: 5 days  . Minutes of Exercise per Session: 30 min  Stress: No Stress Concern Present  . Feeling of Stress : Not at all  Social Connections: Socially Integrated  . Frequency of Communication with Friends and Family: More than three times a week  . Frequency of Social Gatherings with Friends and Family: More than three times a week  . Attends Religious Services: More than 4 times per year  . Active Member of Clubs or Organizations: Yes  . Attends Archivist Meetings: More than 4 times per year  . Marital Status: Married     Family History: The patient's family history includes CAD in her brother; Diabetes in her brother and brother; Hypertension in her brother, brother, and mother. There is no history of Colon cancer, Esophageal cancer, Pancreatic cancer, Liver disease, Stomach cancer, Rectal cancer, or Colon polyps.  ROS:   Please see the history of present illness.     All other systems reviewed and are negative.  EKGs/Labs/Other Studies Reviewed:    The following studies were reviewed today:   Echocardiogram 11/30/2020: Impressions: 1. Left ventricular ejection fraction, by estimation, is 55 to 60%. The  left ventricle has normal function. The left ventricle has no regional  wall motion abnormalities. Left ventricular diastolic parameters were  normal.   2. Right ventricular systolic function is normal. The right ventricular  size is normal.  3. The mitral valve is normal in structure. Trivial mitral valve  regurgitation. No evidence of mitral stenosis.  4. The aortic valve is normal in structure. Aortic valve regurgitation is  not visualized. No aortic stenosis is present.  5. The inferior vena cava is normal in size with greater than 50%  respiratory variability, suggesting right atrial pressure of 3 mmHg. _______________  Cardiac Catheterization 11/30/2020:  Widely patent left main  Heavily calcified left main, and LAD. LAD contains significant tortuosity. In the distal third there is 50% narrowing followed by an area of ectasia and then 70% narrowing near the apex. Beyond this 3 moderate-sized diagonals are arise.  Circumflex gives origin to 3 obtuse marginals. The first is moderate in  size and contains a region of angulation in which there is a 90% stenosis.  A large ramus intermedius is tortuous and contains proximal 95% stenosis. The stenosis is within an acute bend.  Right coronary is diffusely diseased proximally with 90% stenosis. The vessel is small in diameter. PDA is totally occluded and fills by right to right and left-to-right collaterals.  Normal LV function.  Numerous left ventricular AV fistula a cause left ventricular filling during left coronary injection.  Recommendations:  Calcific and ectatic coronary disease with marked tortuosity making any PCI procedure very difficult. The LAD does not contain significant proximal disease. There is moderate distal disease. Would recommend consideration of coronary bypass grafting for ramus, circumflex, PDA, and distal LAD. If this is not possible, perhaps he ramus intermedius could be stented although will be difficult due to tortuosity and calcification.  Diagnostic Dominance: Right       Recent Labs: 10/10/2020: TSH 2.08 12/06/2020: Magnesium  3.1 12/08/2020: Hemoglobin 11.1; Platelets 170 03/03/2021: ALT 15; BUN 48; Creatinine, Ser 1.58; Potassium 4.7; Sodium 140  Recent Lipid Panel    Component Value Date/Time   CHOL 163 03/03/2021 1017   TRIG 97.0 03/03/2021 1017   HDL 66.80 03/03/2021 1017   CHOLHDL 2 03/03/2021 1017   VLDL 19.4 03/03/2021 1017   LDLCALC 77 03/03/2021 1017     Risk Assessment/Calculations:      Physical Exam:    VS:  BP 140/70 (BP Location: Left Arm, Patient Position: Sitting, Cuff Size: Normal)   Pulse 85   Ht 4\' 8"  (1.422 m)   Wt 95 lb (43.1 kg)   SpO2 98%   BMI 21.30 kg/m     Wt Readings from Last 3 Encounters:  03/22/21 95 lb (43.1 kg)  03/07/21 94 lb 3.2 oz (42.7 kg)  02/24/21 90 lb (40.8 kg)     GEN:  Well nourished, well developed in no acute distress HEENT: Normal NECK: No JVD; No carotid bruits LYMPHATICS: No lymphadenopathy CARDIAC: RRR, no murmurs, rubs, gallops, scars are well-healed RESPIRATORY:  Clear to auscultation without rales, wheezing or rhonchi  ABDOMEN: Soft, non-tender, non-distended MUSCULOSKELETAL:  No edema; No deformity  SKIN: Warm and dry NEUROLOGIC:  Alert and oriented x 3 PSYCHIATRIC:  Normal affect   ASSESSMENT:    1. S/P CABG x 4   2. NSTEMI (non-ST elevated myocardial infarction) ([place])   3. Mixed hyperlipidemia   4. Diabetes mellitus due to underlying condition with hyperosmolarity without coma, unspecified whether long term insulin use ([facility])    PLAN:    In order of problems listed above:  Hypersensitivity skin discomfort over leg scars - Improved, taking short-term Lyrica.  Coronary artery disease status post CABG - Excellent improvement.  Doing well.  No chest pain fevers chills nausea vomiting syncope bleeding.  Continue with goal-directed medical therapy.  Aspirin lifelong.  Aspirin decreased to 81 mg.  Take this lifelong. -Continue with metoprolol low-dose.  Hyperlipidemia - LDL originally 226.  Currently on both rosuvastatin 20 mg as  well as Zetia 10 mg.  Doing well.  Her last LDL was 77 just slightly above goal.  Continue with rehabilitation and dietary management.  Diabetes with hypertension - Continue with current plan.  She is doing well with her hemoglobin A1c improved to 8.7.  Per primary team.  Chronic kidney disease stage IIIa - Creatinine 1.58 on most recent check.  At baseline.  Avoid nephrotoxic agents.       Medication Adjustments/Labs and Tests Ordered: Current medicines  are reviewed at length with the patient today.  Concerns regarding medicines are outlined above.  No orders of the defined types were placed in this encounter.  Meds ordered this encounter  Medications  . aspirin EC 81 MG tablet    Sig: Take 1 tablet (81 mg total) by mouth daily. Swallow whole.    Dispense:  90 tablet    Refill:  3    Patient Instructions  Medication Instructions:  Please decrease your Asprin to 81 mg a day. Continue all other medications as listed.  *If you need a refill on your cardiac medications before your next appointment, please call your pharmacy*  Follow-Up: At [facility], you and your health needs are our priority.  As part of our continuing mission to provide you with exceptional heart care, we have created designated Provider Care Teams.  These Care Teams include your primary Cardiologist (physician) and Advanced Practice Providers (APPs -  Physician Assistants and Nurse Practitioners) who all work together to provide you with the care you need, when you need it.  We recommend signing up for the patient portal called "MyChart".  Sign up information is provided on this After Visit Summary.  MyChart is used to connect with patients for Virtual Visits (Telemedicine).  Patients are able to view lab/test results, encounter notes, upcoming appointments, etc.  Non-urgent messages can be sent to your provider as well.   To learn more about what you can do with MyChart, go to [email or web address].     Your next appointment:   6 months  The format for your next appointment:   In Person  Provider:   Candee Furbish, MD  Thank you for choosing [facility]!!         Signed, Candee Furbish, MD  03/22/2021 10:50 AM    [place]

## 2021-03-22 NOTE — Patient Instructions (Addendum)
Medication Instructions:  Please decrease your Asprin to 81 mg a day. Continue all other medications as listed.  *If you need a refill on your cardiac medications before your next appointment, please call your pharmacy*  Follow-Up: At [facility], you and your health needs are our priority.  As part of our continuing mission to provide you with exceptional heart care, we have created designated Provider Care Teams.  These Care Teams include your primary Cardiologist (physician) and Advanced Practice Providers (APPs -  Physician Assistants and Nurse Practitioners) who all work together to provide you with the care you need, when you need it.  We recommend signing up for the patient portal called "MyChart".  Sign up information is provided on this After Visit Summary.  MyChart is used to connect with patients for Virtual Visits (Telemedicine).  Patients are able to view lab/test results, encounter notes, upcoming appointments, etc.  Non-urgent messages can be sent to your provider as well.   To learn more about what you can do with MyChart, go to [email or web address].    Your next appointment:   6 months  The format for your next appointment:   In Person  Provider:   Candee Furbish, MD  Thank you for choosing [facility]!!

## 2021-03-24 ENCOUNTER — Encounter (HOSPITAL_COMMUNITY)
Admission: RE | Admit: 2021-03-24 | Discharge: 2021-03-24 | Disposition: A | Discharge status: Home / Self Care | Payer: Medicare Other | Source: Ambulatory Visit | Attending: Cardiology | Admitting: Cardiology

## 2021-03-24 ENCOUNTER — Other Ambulatory Visit: Payer: Self-pay

## 2021-03-24 DIAGNOSIS — Z951 Presence of aortocoronary bypass graft: Secondary | ICD-10-CM | POA: Diagnosis present

## 2021-03-24 DIAGNOSIS — I214 Non-ST elevation (NSTEMI) myocardial infarction: Secondary | ICD-10-CM | POA: Diagnosis not present

## 2021-03-24 NOTE — Progress Notes (Signed)
Ruchy returned to cardiac rehab and exercised without difficulty.Barnet Pall, RN,BSN 03/24/2021 11:46 AM

## 2021-03-24 NOTE — Progress Notes (Signed)
Nutrition Note: follow up  Spoke with pt. Downloaded CGM report.  Continuous glucose monitoring download:      Average is    176  for 14 days   Time sensor is active   79 %   Time in range (70-180 mg/dL):  54 % (Goal >70%)   Time High (181-250 mg/dL)  32 % (Goal < 25%)   Time Very High (>250 mg/dl)  11 % (Goal < 5%)   Time Low (54-69 mg/dL)  2 % (Goal is <4%)   Time Very Low (<54)  1%  (Goal <1%)   Coefficient of variation  31.3% (Goal is <36%)      Pt is checking CGM 3-4 times per day. She reports trying to check more often. She checks at meal times. Recommended checking at bedtime and wake time.  Reviewed pt meal timing and insulin injections.  She states doing the following: 4-8 units novolog with breakfast (4 on cardiac rehab days) 16 units lantus at lunch 8 units novolog at dinner  We reviewed MOA of novolog and lantus. Provided instructions from Dr. Ronnie Derby note to pt. Instructed pt to take insulin as prescribed by MD. Discussed novolog covering meal times and taking lantus in the morning/at the same time each day.   We discussed meal planning and modifying carb intake. Provided diabetes plate method education.  Recommended creating balanced meals including a protein and fiber source at all meals/snacks.   Pt verbalized understanding. Provided written instructions.  Michaele Offer, MS, RDN, LDN

## 2021-03-27 ENCOUNTER — Encounter (HOSPITAL_COMMUNITY)
Admission: RE | Admit: 2021-03-27 | Discharge: 2021-03-27 | Disposition: A | Discharge status: Home / Self Care | Payer: Medicare Other | Source: Ambulatory Visit | Attending: Cardiology | Admitting: Cardiology

## 2021-03-27 ENCOUNTER — Other Ambulatory Visit: Payer: Self-pay

## 2021-03-27 DIAGNOSIS — Z951 Presence of aortocoronary bypass graft: Secondary | ICD-10-CM

## 2021-03-27 DIAGNOSIS — I214 Non-ST elevation (NSTEMI) myocardial infarction: Secondary | ICD-10-CM

## 2021-03-29 ENCOUNTER — Encounter (HOSPITAL_COMMUNITY)
Admission: RE | Admit: 2021-03-29 | Discharge: 2021-03-29 | Disposition: A | Discharge status: Home / Self Care | Payer: Medicare Other | Source: Ambulatory Visit | Attending: Cardiology | Admitting: Cardiology

## 2021-03-29 ENCOUNTER — Other Ambulatory Visit: Payer: Self-pay

## 2021-03-29 DIAGNOSIS — I214 Non-ST elevation (NSTEMI) myocardial infarction: Secondary | ICD-10-CM | POA: Diagnosis not present

## 2021-03-29 DIAGNOSIS — Z951 Presence of aortocoronary bypass graft: Secondary | ICD-10-CM

## 2021-03-29 NOTE — Progress Notes (Signed)
Reviewed home exercise guidelines with patient including endpoints, temperature precautions, target heart rate and rate of perceived exertion. Patient plans to walk as her mode of home exercise. Patient voices understanding of instructions given.  Trentyn Boisclair M Jonta Gastineau, MS, ACSM CEP  

## 2021-03-31 ENCOUNTER — Encounter (HOSPITAL_COMMUNITY)
Admission: RE | Admit: 2021-03-31 | Discharge: 2021-03-31 | Disposition: A | Discharge status: Home / Self Care | Payer: Medicare Other | Source: Ambulatory Visit | Attending: Cardiology | Admitting: Cardiology

## 2021-03-31 ENCOUNTER — Other Ambulatory Visit: Payer: Self-pay

## 2021-03-31 DIAGNOSIS — I214 Non-ST elevation (NSTEMI) myocardial infarction: Secondary | ICD-10-CM

## 2021-03-31 DIAGNOSIS — Z951 Presence of aortocoronary bypass graft: Secondary | ICD-10-CM

## 2021-04-03 ENCOUNTER — Encounter (HOSPITAL_COMMUNITY): Payer: Medicare Other

## 2021-04-03 ENCOUNTER — Telehealth (HOSPITAL_COMMUNITY): Payer: Self-pay | Admitting: Internal Medicine

## 2021-04-05 ENCOUNTER — Encounter (HOSPITAL_COMMUNITY)
Admission: RE | Admit: 2021-04-05 | Discharge: 2021-04-05 | Disposition: A | Discharge status: Home / Self Care | Payer: Medicare Other | Source: Ambulatory Visit | Attending: Cardiology | Admitting: Cardiology

## 2021-04-05 ENCOUNTER — Other Ambulatory Visit: Payer: Self-pay

## 2021-04-05 DIAGNOSIS — Z951 Presence of aortocoronary bypass graft: Secondary | ICD-10-CM

## 2021-04-05 DIAGNOSIS — I214 Non-ST elevation (NSTEMI) myocardial infarction: Secondary | ICD-10-CM | POA: Diagnosis not present

## 2021-04-05 NOTE — Progress Notes (Signed)
CBG 306 by CGM. 296 by hospital meter. Eleanor said she had some dates and pecans in her oat meal this morning. Patient exercised without difficulty. Patient was encouraged to drink water and check her CBG's throughout the day.Barnet Pall, RN,BSN 04/05/2021 4:23 PM

## 2021-04-06 LAB — GLUCOSE, CAPILLARY: Glucose-Capillary: 296 mg/dL — ABNORMAL HIGH (ref 70–99)

## 2021-04-07 ENCOUNTER — Other Ambulatory Visit: Payer: Self-pay

## 2021-04-07 ENCOUNTER — Encounter (HOSPITAL_COMMUNITY)
Admission: RE | Admit: 2021-04-07 | Discharge: 2021-04-07 | Disposition: A | Discharge status: Home / Self Care | Payer: Medicare Other | Source: Ambulatory Visit | Attending: Cardiology | Admitting: Cardiology

## 2021-04-07 DIAGNOSIS — Z951 Presence of aortocoronary bypass graft: Secondary | ICD-10-CM

## 2021-04-07 DIAGNOSIS — I214 Non-ST elevation (NSTEMI) myocardial infarction: Secondary | ICD-10-CM | POA: Diagnosis not present

## 2021-04-10 ENCOUNTER — Encounter (HOSPITAL_COMMUNITY)
Admission: RE | Admit: 2021-04-10 | Discharge: 2021-04-10 | Disposition: A | Discharge status: Home / Self Care | Payer: Medicare Other | Source: Ambulatory Visit | Attending: Cardiology | Admitting: Cardiology

## 2021-04-10 ENCOUNTER — Other Ambulatory Visit: Payer: Self-pay

## 2021-04-10 DIAGNOSIS — I214 Non-ST elevation (NSTEMI) myocardial infarction: Secondary | ICD-10-CM

## 2021-04-10 DIAGNOSIS — Z951 Presence of aortocoronary bypass graft: Secondary | ICD-10-CM

## 2021-04-10 LAB — GLUCOSE, CAPILLARY
Glucose-Capillary: 279 mg/dL — ABNORMAL HIGH (ref 70–99)
Glucose-Capillary: 302 mg/dL — ABNORMAL HIGH (ref 70–99)

## 2021-04-11 NOTE — Progress Notes (Signed)
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 Swallow whole., Disp: 90 tablet, Rfl: 3   calcium carbonate (OSCAL) 1500 (600 Ca) MG TABS tablet, Take 1 tablet by mouth 2 (two) times daily with a meal., Disp: , Rfl:    Continuous Blood Gluc Sensor (FREESTYLE LIBRE 14 DAY SENSOR) MISC, Inject 1 each into the skin every 14 (fourteen) days., Disp: 2 each, Rfl: 5   ezetimibe (ZETIA) 10 MG tablet, Take 1 tablet (10 mg total) by mouth daily., Disp: 90 tablet, Rfl: 3   glucose blood test strip, Use as instructed to test blood sugar 3 times daily E11.65, Disp: 270 each, Rfl: 1   insulin aspart (NOVOLOG FLEXPEN) 100 UNIT/ML FlexPen, 6 UNITS BEFORE Breakfast on exercise days and if eating eggs 8 Units on other days LUNCH COVERAGE: NOVOLOG 8 UNITS if eating at home and 10 units for fast food Dinner 6 units Novolog before eating, Disp: 15 mL, Rfl: 3   insulin glargine (LANTUS SOLOSTAR) 100 UNIT/ML Solostar Pen, Inject 16 Units into the skin daily. INJECT 16 UNITS UNDER THE SKIN ONCE DAILY. (UPDATED DOSAGE), Disp: 15 mL, Rfl: 3   metoprolol tartrate (LOPRESSOR) 25 MG tablet, Take 0.5 tablets (12.5 mg total) by mouth 2 (two) times daily., Disp: 30 tablet, Rfl: 3   oxyCODONE (OXY IR/ROXICODONE) 5 MG immediate release tablet, TAKE 1 TABLET (5 MG TOTAL) BY MOUTH EVERY 4 (FOUR) HOURS AS NEEDED FOR SEVERE PAIN., Disp:  30 tablet, Rfl: 0   pregabalin (LYRICA) 25 MG capsule, Take 1 capsule (25 mg total) by mouth 2 (two) times daily., Disp: 60 capsule, Rfl: 0   rosuvastatin (CRESTOR) 20 MG tablet, Take 1 tablet (20 mg total) by mouth daily., Disp: 30 tablet, Rfl: 5  Current Facility-Administered Medications:    diclofenac Sodium (VOLTAREN) 1 % topical gel 2 g, 2 g, Topical, TID, Mariah Skillern, PA-C  Past Medical History: Past Medical History:  Diagnosis Date   Chest pain 11/30/2020   Coronary artery disease    Diabetes mellitus without complication ([place])    Glaucoma    History of chicken pox    Hypertension     Tobacco Use: Social History   Tobacco Use  Smoking Status Never  Smokeless Tobacco Never    Labs: Recent Review Flowsheet Data     Labs for ITP Cardiac and Pulmonary Rehab Latest Ref Rng & Units 12/05/2020 12/05/2020 12/05/2020 12/06/2020 03/03/2021   Cholestrol 0 - 200 mg/dL - - - - 163   LDLCALC 0 - 99 mg/dL - - - - 77   HDL >39.00 mg/dL - - - - 66.80   Trlycerides 0.0 - 149.0 mg/dL - - - - 97.0   Hemoglobin A1c 4.6 - 6.5 % - - - - 8.7(H)   PHART 7.350 - 7.450 - 7.441 7.358 7.404 -   PCO2ART 32.0 - 48.0 mmHg -  33.7 38.1 28.9(L) -   HCO3 20.0 - 28.0 mmol/L - 23.0 21.6 18.1(L) -   TCO2 22 - 32 mmol/L _0 19(L) -   ACIDBASEDEF 0.0 - 2.0 mmol/L - 1.0 4.0(H) 6.0(H) -   O2SAT % - 100.0 93.0 98.0 -       Capillary Blood Glucose: Lab Results  Component Value Date   GLUCAP 279 (H) 04/10/2021   GLUCAP 302 (H) 04/10/2021   GLUCAP 296 (H) 04/05/2021   GLUCAP 77 03/06/2021   GLUCAP 112 (H) 03/01/2021     Exercise Target Goals: Exercise Program Goal: Individual exercise prescription set using results from initial 6 min walk test and THRR while considering  patient's activity barriers and safety.   Exercise Prescription Goal: Initial exercise prescription builds to 30-45 minutes a day of aerobic activity, 2-3 days per week.  Home exercise guidelines will be given to  patient during program as part of exercise prescription that the participant will acknowledge.  Activity Barriers & Risk Stratification:  Activity Barriers & Cardiac Risk Stratification - 02/23/21 1137       Activity Barriers & Cardiac Risk Stratification   Activity Barriers Arthritis;Balance Concerns;Incisional Pain    Cardiac Risk Stratification High   Achieved under 5 MET's            6 Minute Walk:  6 Minute Walk     Row Name 02/23/21 1136         6 Minute Walk   Distance 1461 feet     Walk Time 6 minutes     # of Rest Breaks 0     MPH 2.77     METS 2.04     RPE 11     Perceived Dyspnea  0     VO2 Peak 10.63     Symptoms No     Resting HR 80 bpm     Resting BP 142/70     Resting Oxygen Saturation  98 %     Exercise Oxygen Saturation  during 6 min walk 100 %     Max Ex. HR 90 bpm     Max Ex. BP 138/72     2 Minute Post BP 134/68              Oxygen Initial Assessment:   Oxygen Re-Evaluation:   Oxygen Discharge (Final Oxygen Re-Evaluation):   Initial Exercise Prescription:  Initial Exercise Prescription - 02/23/21 1100       Date of Initial Exercise RX and Referring Provider   Date 02/23/21    Referring Provider Dr Candee Furbish MD    Expected Discharge Date 04/21/21      Recumbant Bike   Level 1.5    RPM 80    Minutes 15    METs 2.3      NuStep   Level 2    SPM 80    Minutes 15    METs 2.3      Prescription Details   Frequency (times per week) 3    Duration Progress to 30 minutes of continuous aerobic without signs/symptoms of physical distress      Intensity   THRR 40-80% of Max Heartrate 59-118    Ratings of Perceived Exertion 11-13    Perceived Dyspnea 0-4      Progression   Progression Continue progressive overload as per policy without signs/symptoms or physical distress.      Resistance Training   Training Prescription Yes    Weight 3  Reps 10-15             Perform Capillary Blood Glucose checks as  needed.  Exercise Prescription Changes:   Exercise Prescription Changes     Row Name 03/01/21 1115 03/10/21 1105 03/27/21 1100 03/29/21 1057 04/10/21 1119     Response to Exercise   Blood Pressure (Admit) 150/82 128/60 150/62 120/80 150/74   Blood Pressure (Exercise) 162/80 148/78 142/64 142/72 142/70   Blood Pressure (Exit) 140/80 138/82 138/72 120/70 138/72   Heart Rate (Admit) 76 bpm 87 bpm 75 bpm 88 bpm 76 bpm   Heart Rate (Exercise) 111 bpm 121 bpm 119 bpm 114 bpm 132 bpm   Heart Rate (Exit) 81 bpm 87 bpm 82 bpm 88 bpm 83 bpm   Rating of Perceived Exertion (Exercise) _0 Symptoms _1    Comments Off to a good start with exercise. -- -- -- --   Duration Continue with 30 min of aerobic exercise without signs/symptoms of physical distress. Continue with 30 min of aerobic exercise without signs/symptoms of physical distress. Continue with 30 min of aerobic exercise without signs/symptoms of physical distress. Continue with 30 min of aerobic exercise without signs/symptoms of physical distress. Continue with 30 min of aerobic exercise without signs/symptoms of physical distress.   Intensity _2      Progression   Progression Continue to progress workloads to maintain intensity without signs/symptoms of physical distress. Continue to progress workloads to maintain intensity without signs/symptoms of physical distress. Continue to progress workloads to maintain intensity without signs/symptoms of physical distress. Continue to progress workloads to maintain intensity without signs/symptoms of physical distress. Continue to progress workloads to maintain intensity without signs/symptoms of physical distress.   Average METs 2.7 2.5 2.6 2.6 2.3     Resistance Training   Training Prescription No Yes Yes No Yes   Weight -- 2 lbs 2 lbs -- 2 lbs   Reps -- 10-15 10-15 -- 10-15   Time -- 10  Minutes 10 Minutes -- 10 Minutes     Interval Training   Interval Training _3      Recumbant Bike   Level 1.5 1.5 1._4 Minutes _5 METs 2.9 2.5 2.2 2.4 2.2     NuStep   Level _6 SPM 85 85 85 85 85   Minutes _7 METs 2.5 2.6 3.1 2.8 2.4     Home Exercise Plan   Plans to continue exercise at -- -- -- Home (comment) Home (comment)   Frequency -- -- -- Add 2 additional days to program exercise sessions. Add 2 additional days to program exercise sessions.   Initial Home Exercises Provided -- -- -- 03/29/21 03/29/21            Exercise Comments:   Exercise Comments     Row Name 03/01/21 1214 03/29/21 1114 04/10/21 1137       Exercise Comments Patient tolerated first session of exercise well without symptoms. Reviewed home exercise guidelines, METs, and goals with patient. Increased level on recumbent bike, tolerated well. Reviewed METs and goals with patient.              Exercise Goals and Review:   Exercise Goals     Row Name 02/23/21 1145  Exercise Goals   Increase Physical Activity Yes       Intervention Provide advice, education, support and counseling about physical activity/exercise needs.;Develop an individualized exercise prescription for aerobic and resistive training based on initial evaluation findings, risk stratification, comorbidities and participant's personal goals.       Expected Outcomes Short Term: Attend rehab on a regular basis to increase amount of physical activity.;Long Term: Add in home exercise to make exercise part of routine and to increase amount of physical activity.;Long Term: Exercising regularly at least 3-5 days a week.       Increase Strength and Stamina Yes       Intervention Provide advice, education, support and counseling about physical activity/exercise needs.;Develop an individualized exercise prescription for aerobic and resistive training based on initial  evaluation findings, risk stratification, comorbidities and participant's personal goals.       Expected Outcomes Short Term: Increase workloads from initial exercise prescription for resistance, speed, and METs.;Short Term: Perform resistance training exercises routinely during rehab and add in resistance training at home;Long Term: Improve cardiorespiratory fitness, muscular endurance and strength as measured by increased METs and functional capacity (6MWT)       Able to understand and use rate of perceived exertion (RPE) scale Yes       Intervention Provide education and explanation on how to use RPE scale       Expected Outcomes Short Term: Able to use RPE daily in rehab to express subjective intensity level;Long Term:  Able to use RPE to guide intensity level when exercising independently       Knowledge and understanding of Target Heart Rate Range (THRR) Yes       Intervention Provide education and explanation of THRR including how the numbers were predicted and where they are located for reference       Expected Outcomes Short Term: Able to state/look up THRR;Long Term: Able to use THRR to govern intensity when exercising independently;Short Term: Able to use daily as guideline for intensity in rehab       Understanding of Exercise Prescription Yes       Intervention Provide education, explanation, and written materials on patient's individual exercise prescription       Expected Outcomes Short Term: Able to explain program exercise prescription;Long Term: Able to explain home exercise prescription to exercise independently                Exercise Goals Re-Evaluation :  Exercise Goals Re-Evaluation     Row Name 03/01/21 1214 03/13/21 1701 03/29/21 1114 04/10/21 1137       Exercise Goal Re-Evaluation   Exercise Goals Review Increase Physical Activity;Able to understand and use rate of perceived exertion (RPE) scale -- Increase Physical Activity;Able to understand and use rate of  perceived exertion (RPE) scale;Increase Strength and Stamina;Knowledge and understanding of Target Heart Rate Range (THRR);Understanding of Exercise Prescription Increase Physical Activity;Able to understand and use rate of perceived exertion (RPE) scale;Increase Strength and Stamina;Knowledge and understanding of Target Heart Rate Range (THRR);Understanding of Exercise Prescription    Comments Patient able to understand and use RPE scale appropriately. Patient called out today due to COVID 19 exposeure, unable to review goals. Reviewed home exercise guidelines. Patient plans to walk 30 minutes, 1-2 days/week in addition to exercise at cardiac rehab. Patient plans to use cans for her resistance training 1-2 days/week. Patient's goal is to feel better. Patient is doing some walking at home. Instructed patient on how to manually check pulse.  Expected Outcomes Increase workloads as tolerated to help increase strength and stamina. Will review goals with patient upon return to exercise. Patient will add 1-2 days of walking at home in addition to exercise at cardiac rehab to help increase strength and stamina. Patient will continue walking at home. Patient will continue to practice pulse coutning.             Discharge Exercise Prescription (Final Exercise Prescription Changes):  Exercise Prescription Changes - 04/10/21 1119       Response to Exercise   Blood Pressure (Admit) 150/74    Blood Pressure (Exercise) 142/70    Blood Pressure (Exit) 138/72    Heart Rate (Admit) 76 bpm    Heart Rate (Exercise) 132 bpm    Heart Rate (Exit) 83 bpm    Rating of Perceived Exertion (Exercise) 13    Symptoms none    Duration Continue with 30 min of aerobic exercise without signs/symptoms of physical distress.    Intensity THRR unchanged      Progression   Progression Continue to progress workloads to maintain intensity without signs/symptoms of physical distress.    Average METs 2.3      Resistance  Training   Training Prescription Yes    Weight 2 lbs    Reps 10-15    Time 10 Minutes      Interval Training   Interval Training No      Recumbant Bike   Level 2    Minutes 15    METs 2.2      NuStep   Level 2    SPM 85    Minutes 15    METs 2.4      Home Exercise Plan   Plans to continue exercise at Home (comment)    Frequency Add 2 additional days to program exercise sessions.    Initial Home Exercises Provided 03/29/21             Nutrition:  Target Goals: Understanding of nutrition guidelines, daily intake of sodium <153m, cholesterol <2054m calories 30% from fat and 7% or less from saturated fats, daily to have 5 or more servings of fruits and vegetables.  Biometrics:  Pre Biometrics - 02/23/21 1133       Pre Biometrics   Waist Circumference 30.25 inches    Hip Circumference 33.5 inches    Waist to Hip Ratio 0.9 %    Triceps Skinfold 29 mm    % Body Fat 35 %    Grip Strength 24 kg    Flexibility 13 in    Single Leg Stand 5 seconds              Nutrition Therapy Plan and Nutrition Goals:  Nutrition Therapy & Goals - 03/08/21 1424       Nutrition Therapy   Diet TLC; carb modified      Personal Nutrition Goals   Nutrition Goal Pt to maintain or gain weight during cardiac rehab    Personal Goal #2 Pt to monitor CGM minimum 4 times per day    Personal Goal #3 Pt to learn to estimate small, medium, and large carbohydrate meals for more consistent carb intake across day    Personal Goal #4 Improved blood glucose control as evidenced by pt's A1c trending from 8.7 toward less than 7.0.      Intervention Plan   Intervention Prescribe, educate and counsel regarding individualized specific dietary modifications aiming towards targeted core components such as weight, hypertension, lipid management, diabetes,  heart failure and other comorbidities.;Nutrition handout(s) given to patient.    Expected Outcomes Long Term Goal: Adherence to prescribed  nutrition plan.;Short Term Goal: A plan has been developed with personal nutrition goals set during dietitian appointment.             Nutrition Assessments:  MEDIFICTS Score Key: ?70 Need to make dietary changes  40-70 Heart Healthy Diet ? 40 Therapeutic Level Cholesterol Diet   Flowsheet Row CARDIAC REHAB PHASE II EXERCISE from 03/08/2021 in [place]  Picture Your Plate Total Score on Admission 65      Picture Your Plate Scores: <60 Unhealthy dietary pattern with much room for improvement. 41-50 Dietary pattern unlikely to meet recommendations for good health and room for improvement. 51-60 More healthful dietary pattern, with some room for improvement.  >60 Healthy dietary pattern, although there may be some specific behaviors that could be improved.    Nutrition Goals Re-Evaluation:  Nutrition Goals Re-Evaluation     [place] Name 03/08/21 1427 04/10/21 0956           Goals   Current Weight 94 lb (42.6 kg) 97 lb 3.6 oz (44.1 kg)      Nutrition Goal Pt to maintain or gain weight during cardiac rehab Pt to maintain or gain weight during cardiac rehab             Personal Goal #2 Re-Evaluation      Personal Goal #2 Pt to monitor CGM minimum 4 times per day Pt to monitor CGM minimum 4 times per day             Personal Goal #3 Re-Evaluation      Personal Goal #3 Pt to learn to estimate small, medium, and large carbohydrate meals for more consistent carb intake across day Pt to learn to estimate small, medium, and large carbohydrate meals for more consistent carb intake across day             Personal Goal #4 Re-Evaluation      Personal Goal #4 Improved blood glucose control as evidenced by pt's A1c trending from 8.7 toward less than 7.0. Improved blood glucose control as evidenced by pt's A1c trending from 8.7 toward less than 7.0.              Nutrition Goals Re-Evaluation:  Nutrition Goals Re-Evaluation     [place] Name 03/08/21  1427 04/10/21 0956           Goals   Current Weight 94 lb (42.6 kg) 97 lb 3.6 oz (44.1 kg)      Nutrition Goal Pt to maintain or gain weight during cardiac rehab Pt to maintain or gain weight during cardiac rehab             Personal Goal #2 Re-Evaluation      Personal Goal #2 Pt to monitor CGM minimum 4 times per day Pt to monitor CGM minimum 4 times per day             Personal Goal #3 Re-Evaluation      Personal Goal #3 Pt to learn to estimate small, medium, and large carbohydrate meals for more consistent carb intake across day Pt to learn to estimate small, medium, and large carbohydrate meals for more consistent carb intake across day             Personal Goal #4 Re-Evaluation      Personal Goal #4 Improved blood glucose control as evidenced by  pt's A1c trending from 8.7 toward less than 7.0. Improved blood glucose control as evidenced by pt's A1c trending from 8.7 toward less than 7.0.              Nutrition Goals Discharge (Final Nutrition Goals Re-Evaluation):  Nutrition Goals Re-Evaluation - 04/10/21 0956       Goals   Current Weight 97 lb 3.6 oz (44.1 kg)    Nutrition Goal Pt to maintain or gain weight during cardiac rehab      Personal Goal #2 Re-Evaluation   Personal Goal #2 Pt to monitor CGM minimum 4 times per day      Personal Goal #3 Re-Evaluation   Personal Goal #3 Pt to learn to estimate small, medium, and large carbohydrate meals for more consistent carb intake across day      Personal Goal #4 Re-Evaluation   Personal Goal #4 Improved blood glucose control as evidenced by pt's A1c trending from 8.7 toward less than 7.0.             Psychosocial: Target Goals: Acknowledge presence or absence of significant depression and/or stress, maximize coping skills, provide positive support system. Participant is able to verbalize types and ability to use techniques and skills needed for reducing stress and depression.  Initial Review & Psychosocial  Screening:  Initial Psych Review & Screening - 02/23/21 1129       Initial Review   Current issues with None Identified      Family Dynamics   Good Support System? Yes   Mariah Peterson has her husband, daughter and grandson for support     Barriers   Psychosocial barriers to participate in program There are no identifiable barriers or psychosocial needs.      Screening Interventions   Interventions Encouraged to exercise             Quality of Life Scores:  Quality of Life - 02/23/21 1147       Quality of Life   Select Quality of Life      Quality of Life Scores   Health/Function Pre 25.6 %    Socioeconomic Pre 26.57 %    Psych/Spiritual Pre 23.14 %    Family Pre 25.2 %    GLOBAL Pre 25.24 %            Scores of 19 and below usually indicate a poorer quality of life in these areas.  A difference of  2-3 points is a clinically meaningful difference.  A difference of 2-3 points in the total score of the Quality of Life Index has been associated with significant improvement in overall quality of life, self-image, physical symptoms, and general health in studies assessing change in quality of life.  PHQ-9: Recent Review Flowsheet Data     Depression screen [facility] 2/9 02/23/2021 02/23/2021 10/19/2020 10/10/2020 09/30/2019   Decreased Interest 0 0 0 0 0   Down, Depressed, Hopeless 0 0 0 0 0   PHQ - 2 Score 0 0 0 0 0   Altered sleeping - - - - 1   Tired, decreased energy - - - - 1   Change in appetite - - - - 1   Feeling bad or failure about yourself  - - - - 0   Trouble concentrating - - - - 0   Moving slowly or fidgety/restless - - - - 0   Suicidal thoughts - - - - 0   PHQ-9 Score - - - - 3   Difficult  doing work/chores - - - - Not difficult at all      Interpretation of Total Score  Total Score Depression Severity:  1-4 = Minimal depression, 5-9 = Mild depression, 10-14 = Moderate depression, 15-19 = Moderately severe depression, 20-27 = Severe depression   Psychosocial  Evaluation and Intervention:   Psychosocial Re-Evaluation:  Psychosocial Re-Evaluation     [place] Name 03/01/21 1545 03/14/21 1145 04/11/21 1607         Psychosocial Re-Evaluation   Current issues with None Identified None Identified None Identified     Interventions Encouraged to attend Cardiac Rehabilitation for the exercise Encouraged to attend Cardiac Rehabilitation for the exercise Encouraged to attend Cardiac Rehabilitation for the exercise     Continue Psychosocial Services  No Follow up required No Follow up required No Follow up required              Psychosocial Discharge (Final Psychosocial Re-Evaluation):  Psychosocial Re-Evaluation - 04/11/21 1607       Psychosocial Re-Evaluation   Current issues with None Identified    Interventions Encouraged to attend Cardiac Rehabilitation for the exercise    Continue Psychosocial Services  No Follow up required             Vocational Rehabilitation: Provide vocational rehab assistance to qualifying candidates.   Vocational Rehab Evaluation & Intervention:  Vocational Rehab - 02/23/21 1046       Initial Vocational Rehab Evaluation & Intervention   Assessment shows need for Vocational Rehabilitation No   Mariah Peterson is retired and does not need vocational rehab at this time            Education: Education Goals: Education classes will be provided on a weekly basis, covering required topics. Participant will state understanding/return demonstration of topics presented.  Learning Barriers/Preferences:   Education Topics: Count Your Pulse:  -Group instruction provided by verbal instruction, demonstration, patient participation and written materials to support subject.  Instructors address importance of being able to find your pulse and how to count your pulse when at home without a heart monitor.  Patients get hands on experience counting their pulse with staff help and individually.   Heart Attack, Angina, and  Risk Factor Modification:  -Group instruction provided by verbal instruction, video, and written materials to support subject.  Instructors address signs and symptoms of angina and heart attacks.    Also discuss risk factors for heart disease and how to make changes to improve heart health risk factors.   Functional Fitness:  -Group instruction provided by verbal instruction, demonstration, patient participation, and written materials to support subject.  Instructors address safety measures for doing things around the house.  Discuss how to get up and down off the floor, how to pick things up properly, how to safely get out of a chair without assistance, and balance training.   Meditation and Mindfulness:  -Group instruction provided by verbal instruction, patient participation, and written materials to support subject.  Instructor addresses importance of mindfulness and meditation practice to help reduce stress and improve awareness.  Instructor also leads participants through a meditation exercise.    Stretching for Flexibility and Mobility:  -Group instruction provided by verbal instruction, patient participation, and written materials to support subject.  Instructors lead participants through series of stretches that are designed to increase flexibility thus improving mobility.  These stretches are additional exercise for major muscle groups that are typically performed during regular warm up and cool down.   Hands Only CPR:  -  Group verbal, video, and participation provides a basic overview of AHA guidelines for community CPR. Role-play of emergencies allow participants the opportunity to practice calling for help and chest compression technique with discussion of AED use.   Hypertension: -Group verbal and written instruction that provides a basic overview of hypertension including the most recent diagnostic guidelines, risk factor reduction with self-care instructions and medication  management.    Nutrition I class: Heart Healthy Eating:  -Group instruction provided by PowerPoint slides, verbal discussion, and written materials to support subject matter. The instructor gives an explanation and review of the Therapeutic Lifestyle Changes diet recommendations, which includes a discussion on lipid goals, dietary fat, sodium, fiber, plant stanol/sterol esters, sugar, and the components of a well-balanced, healthy diet.   Nutrition II class: Lifestyle Skills:  -Group instruction provided by PowerPoint slides, verbal discussion, and written materials to support subject matter. The instructor gives an explanation and review of label reading, grocery shopping for heart health, heart healthy recipe modifications, and ways to make healthier choices when eating out.   Diabetes Question & Answer:  -Group instruction provided by PowerPoint slides, verbal discussion, and written materials to support subject matter. The instructor gives an explanation and review of diabetes co-morbidities, pre- and post-prandial blood glucose goals, pre-exercise blood glucose goals, signs, symptoms, and treatment of hypoglycemia and hyperglycemia, and foot care basics.   Diabetes Blitz:  -Group instruction provided by PowerPoint slides, verbal discussion, and written materials to support subject matter. The instructor gives an explanation and review of the physiology behind type 1 and type 2 diabetes, diabetes medications and rational behind using different medications, pre- and post-prandial blood glucose recommendations and Hemoglobin A1c goals, diabetes diet, and exercise including blood glucose guidelines for exercising safely.    Portion Distortion:  -Group instruction provided by PowerPoint slides, verbal discussion, written materials, and food models to support subject matter. The instructor gives an explanation of serving size versus portion size, changes in portions sizes over the last 20 years,  and what consists of a serving from each food group.   Stress Management:  -Group instruction provided by verbal instruction, video, and written materials to support subject matter.  Instructors review role of stress in heart disease and how to cope with stress positively.     Exercising on Your Own:  -Group instruction provided by verbal instruction, power point, and written materials to support subject.  Instructors discuss benefits of exercise, components of exercise, frequency and intensity of exercise, and end points for exercise.  Also discuss use of nitroglycerin and activating EMS.  Review options of places to exercise outside of rehab.  Review guidelines for sex with heart disease.   Cardiac Drugs I:  -Group instruction provided by verbal instruction and written materials to support subject.  Instructor reviews cardiac drug classes: antiplatelets, anticoagulants, beta blockers, and statins.  Instructor discusses reasons, side effects, and lifestyle considerations for each drug class.   Cardiac Drugs II:  -Group instruction provided by verbal instruction and written materials to support subject.  Instructor reviews cardiac drug classes: angiotensin converting enzyme inhibitors (ACE-I), angiotensin II receptor blockers (ARBs), nitrates, and calcium channel blockers.  Instructor discusses reasons, side effects, and lifestyle considerations for each drug class.   Anatomy and Physiology of the Circulatory System:  Group verbal and written instruction and models provide basic cardiac anatomy and physiology, with the coronary electrical and arterial systems. Review of: AMI, Angina, Valve disease, Heart Failure, Peripheral Artery Disease, Cardiac Arrhythmia, Pacemakers, and the ICD.  Other Education:  -Group or individual verbal, written, or video instructions that support the educational goals of the cardiac rehab program.   Holiday Eating Survival Tips:  -Group instruction provided by  PowerPoint slides, verbal discussion, and written materials to support subject matter. The instructor gives patients tips, tricks, and techniques to help them not only survive but enjoy the holidays despite the onslaught of food that accompanies the holidays.   Knowledge Questionnaire Score:  Knowledge Questionnaire Score - 02/23/21 1148       Knowledge Questionnaire Score   Pre Score 20/24             Core Components/Risk Factors/Patient Goals at Admission:  Personal Goals and Risk Factors at Admission - 02/23/21 1148       Core Components/Risk Factors/Patient Goals on Admission    Weight Management Weight Maintenance;Yes    Intervention Weight Management: Develop a combined nutrition and exercise program designed to reach desired caloric intake, while maintaining appropriate intake of nutrient and fiber, sodium and fats, and appropriate energy expenditure required for the weight goal.;Weight Management: Provide education and appropriate resources to help participant work on and attain dietary goals.    Expected Outcomes Short Term: Continue to assess and modify interventions until short term weight is achieved;Long Term: Adherence to nutrition and physical activity/exercise program aimed toward attainment of established weight goal;Weight Maintenance: Understanding of the daily nutrition guidelines, which includes 25-35% calories from fat, 7% or less cal from saturated fats, less than 272m cholesterol, less than 1.5gm of sodium, & 5 or more servings of fruits and vegetables daily    Diabetes Yes    Intervention Provide education about signs/symptoms and action to take for hypo/hyperglycemia.;Provide education about proper nutrition, including hydration, and aerobic/resistive exercise prescription along with prescribed medications to achieve blood glucose in normal ranges: Fasting glucose 65-99 mg/dL    Expected Outcomes Short Term: Participant verbalizes understanding of the  signs/symptoms and immediate care of hyper/hypoglycemia, proper foot care and importance of medication, aerobic/resistive exercise and nutrition plan for blood glucose control.;Long Term: Attainment of HbA1C < 7%.    Hypertension Yes    Intervention Provide education on lifestyle modifcations including regular physical activity/exercise, weight management, moderate sodium restriction and increased consumption of fresh fruit, vegetables, and low fat dairy, alcohol moderation, and smoking cessation.;Monitor prescription use compliance.    Expected Outcomes Short Term: Continued assessment and intervention until BP is < 140/960mHG in hypertensive participants. < 130/8024mG in hypertensive participants with diabetes, heart failure or chronic kidney disease.;Long Term: Maintenance of blood pressure at goal levels.    Lipids Yes    Intervention Provide education and support for participant on nutrition & aerobic/resistive exercise along with prescribed medications to achieve LDL <84m59mDL >40mg40m Expected Outcomes Short Term: Participant states understanding of desired cholesterol values and is compliant with medications prescribed. Participant is following exercise prescription and nutrition guidelines.;Long Term: Cholesterol controlled with medications as prescribed, with individualized exercise RX and with personalized nutrition plan. Value goals: LDL < 84mg,41m > 40 mg.    Stress Yes    Intervention Offer individual and/or small group education and counseling on adjustment to heart disease, stress management and health-related lifestyle change. Teach and support self-help strategies.;Refer participants experiencing significant psychosocial distress to appropriate mental health specialists for further evaluation and treatment. When possible, include family members and significant others in education/counseling sessions.    Expected Outcomes Short Term: Participant demonstrates changes in health-related  behavior, relaxation and other  stress management skills, ability to obtain effective social support, and compliance with psychotropic medications if prescribed.;Long Term: Emotional wellbeing is indicated by absence of clinically significant psychosocial distress or social isolation.    Personal Goal Other Yes    Personal Goal Pt wants to feel safe and independant. She also wants to regain strength and feel good about her heart health.    Intervention Will progress workloads as tolerated without sign or symptom    Expected Outcomes Pt will achieve her goals within the [facility] program and feel better about her health             Core Components/Risk Factors/Patient Goals Review:   Goals and Risk Factor Review     Row Name 03/01/21 1545 03/14/21 1145 04/11/21 1607         Core Components/Risk Factors/Patient Goals Review   Personal Goals Review Weight Management/Obesity;Stress;Hypertension;Lipids;Diabetes Weight Management/Obesity;Stress;Hypertension;Lipids;Diabetes Weight Management/Obesity;Stress;Hypertension;Lipids;Diabetes     Review Mariah Peterson started exercise on 0/35/46 Systolic BP's were in the 140's to 160's. CBG's were stable today Mariah Peterson continues to do well with exercise. Mariah Peterson has had some variations in her CBG's and BP's will continue to monitor. Ayn's insulin has been decreased by Dr Rozelle Logan continues to do well with exercise. Mariah Peterson has had some variations in her CBG's and BP's will continue to monitor. Mariah Peterson recent CBg's have been elevated prior to exercise. Will continue to monitor CBG's.     Expected Outcomes Mariah Peterson will continue to participate in phase 2 cardiac rehab for exercise, nutrtion and lifestyle modifications Mariah Peterson will continue to participate in phase 2 cardiac rehab for exercise, nutrtion and lifestyle modifications Mariah Peterson will continue to participate in phase 2 cardiac rehab for exercise, nutrtion and lifestyle modifications              Core Components/Risk  Factors/Patient Goals at Discharge (Final Review):   Goals and Risk Factor Review - 04/11/21 1607       Core Components/Risk Factors/Patient Goals Review   Personal Goals Review Weight Management/Obesity;Stress;Hypertension;Lipids;Diabetes    Review Mariah Peterson continues to do well with exercise. Mariah Peterson has had some variations in her CBG's and BP's will continue to monitor. Mariah Peterson recent CBg's have been elevated prior to exercise. Will continue to monitor CBG's.    Expected Outcomes Mariah Peterson will continue to participate in phase 2 cardiac rehab for exercise, nutrtion and lifestyle modifications             ITP Comments:  ITP Comments     Row Name 02/23/21 1045 03/01/21 1543 03/14/21 1143 04/11/21 1606     ITP Comments Dr Fransico Him MD, Medical Director 30 Day ITP Review. Mariah Peterson started exercise on 03/01/21 and did well with exercise. 30 Day ITP Review. Mariah Peterson has been doing well with exercise. Mariah Peterson is currently out from cardiac rehab due to a Covid 19 exposure. Awaiting Covid 19 testing results 30 Day ITP Review. Mariah Peterson has good attendance and participation in phase 2 cardiac rehab.             Comments: See ITP Comments

## 2021-04-12 ENCOUNTER — Encounter (HOSPITAL_COMMUNITY)
Admission: RE | Admit: 2021-04-12 | Discharge: 2021-04-12 | Disposition: A | Discharge status: Home / Self Care | Payer: Medicare Other | Source: Ambulatory Visit | Attending: Cardiology | Admitting: Cardiology

## 2021-04-12 ENCOUNTER — Other Ambulatory Visit: Payer: Self-pay

## 2021-04-12 DIAGNOSIS — I214 Non-ST elevation (NSTEMI) myocardial infarction: Secondary | ICD-10-CM

## 2021-04-12 DIAGNOSIS — Z951 Presence of aortocoronary bypass graft: Secondary | ICD-10-CM

## 2021-04-14 ENCOUNTER — Encounter (HOSPITAL_COMMUNITY)
Admission: RE | Admit: 2021-04-14 | Discharge: 2021-04-14 | Disposition: A | Discharge status: Home / Self Care | Payer: Medicare Other | Source: Ambulatory Visit | Attending: Cardiology | Admitting: Cardiology

## 2021-04-14 ENCOUNTER — Other Ambulatory Visit: Payer: Self-pay

## 2021-04-14 ENCOUNTER — Telehealth: Payer: Self-pay | Admitting: Endocrinology

## 2021-04-14 DIAGNOSIS — I214 Non-ST elevation (NSTEMI) myocardial infarction: Secondary | ICD-10-CM | POA: Diagnosis not present

## 2021-04-14 LAB — GLUCOSE, CAPILLARY: Glucose-Capillary: 321 mg/dL — ABNORMAL HIGH (ref 70–99)

## 2021-04-14 NOTE — Progress Notes (Signed)
Incomplete Session Note  Patient Details  Name: Mariah Peterson MRN: [id number] Date of Birth: 16-Apr-1946 Referring Provider:   Flowsheet Row CARDIAC REHAB PHASE II ORIENTATION from 02/23/2021 in [place]  Referring Provider Dr Candee Furbish MD       Mariah Peterson did not complete her rehab session.  CBG 321. No exercise per protocol. Mariah Peterson had cinnamon oatmeal an apple and diet cranberry juice. Patient asymptomatic. Patient given water. Will notify Dr Ronnie Derby office about elevated CBG.Barnet Pall, RN,BSN 04/14/2021 2:33 PM

## 2021-04-14 NOTE — Telephone Encounter (Signed)
Cardiac Rehab called to advise that patient had elevated Blood Sugar of 321 today at her appointment and has been in the 270's to low 300's all week.

## 2021-04-17 ENCOUNTER — Encounter (HOSPITAL_COMMUNITY)
Admission: RE | Admit: 2021-04-17 | Discharge: 2021-04-17 | Disposition: A | Discharge status: Home / Self Care | Payer: Medicare Other | Source: Ambulatory Visit | Attending: Cardiology | Admitting: Cardiology

## 2021-04-17 ENCOUNTER — Other Ambulatory Visit: Payer: Self-pay

## 2021-04-17 DIAGNOSIS — I214 Non-ST elevation (NSTEMI) myocardial infarction: Secondary | ICD-10-CM

## 2021-04-17 DIAGNOSIS — Z951 Presence of aortocoronary bypass graft: Secondary | ICD-10-CM

## 2021-04-19 ENCOUNTER — Other Ambulatory Visit: Payer: Self-pay

## 2021-04-19 ENCOUNTER — Encounter (HOSPITAL_COMMUNITY)
Admission: RE | Admit: 2021-04-19 | Discharge: 2021-04-19 | Disposition: A | Discharge status: Home / Self Care | Payer: Medicare Other | Source: Ambulatory Visit | Attending: Cardiology | Admitting: Cardiology

## 2021-04-19 DIAGNOSIS — I214 Non-ST elevation (NSTEMI) myocardial infarction: Secondary | ICD-10-CM

## 2021-04-19 DIAGNOSIS — Z951 Presence of aortocoronary bypass graft: Secondary | ICD-10-CM

## 2021-04-21 ENCOUNTER — Other Ambulatory Visit: Payer: Self-pay

## 2021-04-21 ENCOUNTER — Encounter (HOSPITAL_COMMUNITY)
Admission: RE | Admit: 2021-04-21 | Discharge: 2021-04-21 | Disposition: A | Discharge status: Home / Self Care | Payer: Medicare Other | Source: Ambulatory Visit | Attending: Cardiology | Admitting: Cardiology

## 2021-04-21 DIAGNOSIS — Z951 Presence of aortocoronary bypass graft: Secondary | ICD-10-CM | POA: Insufficient documentation

## 2021-04-21 DIAGNOSIS — I214 Non-ST elevation (NSTEMI) myocardial infarction: Secondary | ICD-10-CM | POA: Insufficient documentation

## 2021-04-21 NOTE — Progress Notes (Signed)
Incomplete Session Note  Patient Details  Name: Mariah Peterson MRN: [id number] Date of Birth: 03-Aug-1946 Referring Provider:   Flowsheet Row CARDIAC REHAB PHASE II ORIENTATION from 02/23/2021 in [place]  Referring Provider Dr Candee Furbish MD       Mariah Peterson did not complete her rehab session.  CBG 109 at 1025. Patient was given graham crackers. Patient asymptomatic. Repeat CBG 96 at 1055. CBG noted to be 78 at 1112. Patient was given Ginger ale. No exercise per protocol. Repeat CBG 107 at 1127. [name] left cardiac rehab without complaints or symptoms. [name] plans to return to exercise at cardiac rehab on Wed 04/26/21.Barnet Pall, RN,BSN 04/21/2021 12:23 PM

## 2021-04-24 ENCOUNTER — Other Ambulatory Visit: Payer: Self-pay | Admitting: Physician Assistant

## 2021-04-25 ENCOUNTER — Other Ambulatory Visit: Payer: Self-pay | Admitting: Physician Assistant

## 2021-04-26 ENCOUNTER — Encounter (HOSPITAL_COMMUNITY)
Admission: RE | Admit: 2021-04-26 | Discharge: 2021-04-26 | Disposition: A | Discharge status: Home / Self Care | Payer: Medicare Other | Source: Ambulatory Visit | Attending: Cardiology | Admitting: Cardiology

## 2021-04-26 ENCOUNTER — Other Ambulatory Visit: Payer: Self-pay

## 2021-04-26 DIAGNOSIS — Z951 Presence of aortocoronary bypass graft: Secondary | ICD-10-CM

## 2021-04-26 DIAGNOSIS — I214 Non-ST elevation (NSTEMI) myocardial infarction: Secondary | ICD-10-CM | POA: Diagnosis present

## 2021-04-27 ENCOUNTER — Ambulatory Visit: Payer: Medicare Other | Admitting: Endocrinology

## 2021-04-27 ENCOUNTER — Other Ambulatory Visit: Payer: Self-pay | Admitting: Physician Assistant

## 2021-04-28 ENCOUNTER — Ambulatory Visit: Payer: Medicare Other | Admitting: Internal Medicine

## 2021-05-01 ENCOUNTER — Telehealth (HOSPITAL_COMMUNITY): Payer: Self-pay | Admitting: Internal Medicine

## 2021-05-01 ENCOUNTER — Other Ambulatory Visit: Payer: Self-pay

## 2021-05-01 MED ORDER — METOPROLOL TARTRATE 25 MG PO TABS: 12.5000 mg | ORAL_TABLET | Freq: Two times a day (BID) | ORAL | 3 refills | Status: DC

## 2021-05-01 NOTE — Telephone Encounter (Signed)
Pt's medication was sent to pt's pharmacy as requested. Confirmation received.  °

## 2021-05-02 ENCOUNTER — Ambulatory Visit (INDEPENDENT_AMBULATORY_CARE_PROVIDER_SITE_OTHER): Payer: Medicare Other | Admitting: Endocrinology

## 2021-05-02 ENCOUNTER — Other Ambulatory Visit: Payer: Self-pay

## 2021-05-02 ENCOUNTER — Encounter: Payer: Self-pay | Admitting: Endocrinology

## 2021-05-02 VITALS — BP 164/70 | HR 72 | Ht 59.0 in | Wt 95.4 lb

## 2021-05-02 DIAGNOSIS — I2510 Atherosclerotic heart disease of native coronary artery without angina pectoris: Secondary | ICD-10-CM | POA: Diagnosis not present

## 2021-05-02 DIAGNOSIS — Z794 Long term (current) use of insulin: Secondary | ICD-10-CM

## 2021-05-02 DIAGNOSIS — E1165 Type 2 diabetes mellitus with hyperglycemia: Secondary | ICD-10-CM | POA: Diagnosis not present

## 2021-05-02 DIAGNOSIS — E7800 Pure hypercholesterolemia, unspecified: Secondary | ICD-10-CM | POA: Diagnosis not present

## 2021-05-02 DIAGNOSIS — N289 Disorder of kidney and ureter, unspecified: Secondary | ICD-10-CM | POA: Diagnosis not present

## 2021-05-02 LAB — BASIC METABOLIC PANEL
BUN: 34 mg/dL — ABNORMAL HIGH (ref 6–23)
CO2: 25 mEq/L (ref 19–32)
Calcium: 9.9 mg/dL (ref 8.4–10.5)
Chloride: 105 mEq/L (ref 96–112)
Creatinine, Ser: 1.41 mg/dL — ABNORMAL HIGH (ref 0.40–1.20)
GFR: 36.85 mL/min — ABNORMAL LOW (ref >60.00)
Glucose, Bld: 171 mg/dL — ABNORMAL HIGH (ref 70–99)
Potassium: 4.2 mEq/L (ref 3.5–5.1)
Sodium: 137 mEq/L (ref 135–145)

## 2021-05-02 LAB — LIPID PANEL
Cholesterol: 128 mg/dL (ref 0–200)
HDL: 58.6 mg/dL (ref >39.00)
LDL Cholesterol: 56 mg/dL (ref 0–99)
NonHDL: 69.01
Total CHOL/HDL Ratio: 2
Triglycerides: 64 mg/dL (ref 0.0–149.0)
VLDL: 12.8 mg/dL (ref 0.0–40.0)

## 2021-05-02 MED ORDER — PIOGLITAZONE HCL 15 MG PO TABS: 15.0000 mg | ORAL_TABLET | Freq: Every day | ORAL | 1 refills | Status: DC

## 2021-05-02 MED ORDER — OZEMPIC (0.25 OR 0.5 MG/DOSE) 2 MG/1.5ML ~~LOC~~ SOPN: 0.5000 mg | PEN_INJECTOR | SUBCUTANEOUS | 2 refills | Status: DC

## 2021-05-02 NOTE — Progress Notes (Signed)
Patient ID: Mariah Peterson, female   DOB: 10/27/1945, 75 y.o.   MRN: [id number]          Reason for Appointment: Follow-up for Type 2 Diabetes   History of Present Illness:          Date of diagnosis of type 2 diabetes mellitus:?  1990       Background history:   She was started on insulin about 4 years ago and previously taking metformin and glipizide No detailed history is available and she does not think she has taken any other types of diabetes medications or injections  Recent history:    INSULIN regimen is: Lantus 16 units daily in am, Novolog 6-8 units at breakfast, 8 units ac lunch  , 6 acs  Non-insulin hypoglycemic drugs the patient is taking are: None, previously on Actos 15 mg daily, Ozempic 0.5 mg weekly,  Current management, blood sugar patterns and problems identified:  Her A1c is in 5/22 at 8.7 compared to 11.8  Fructosamine is last 484   She is still taking not Ozempic and or she says that she did not have a prescription Previously had been recommended this to help with postprandial readings even though she has a low body weight She is continuing a program of cardiac rehab after her coronary bypass surgery in 2/22 However she is starting her exercise program about 3 hours after breakfast On the days she is exercising she is taking 6 units at breakfast otherwise she is taking 8 units Also if she is eating higher protein breakfast she will take 6 units With this her blood sugars appear to be the highest after breakfast after 10 AM  Also she has been started on mealtime coverage at dinnertime but blood sugars are highly variable later in the afternoon and evenings On some days she has marked increase in blood sugars late in the evening She does not think she is forgetting her mealtime dose Also not clear why her overnight blood sugars are mostly high but significantly variable, today it was only 83 waking up Her weight appears to have leveled off Although she is  monitoring her blood sugars more often than before usually not after dinner or at bedtime.  No hypoglycemia    Glucose sensor: Freestyle libre   CGM use % of time 65  2-week average/GV 224  Time in range    30    % was 45  % Time Above 180 37  % Time above 250 0  % Time Below 70      PRE-MEAL Fasting Lunch Dinner Bedtime Overall  Glucose range:       Averages:        POST-MEAL PC Breakfast PC Lunch PC Dinner  Glucose range:     Averages:      Previously:   CGM use % of time  64  2-week average/GV  185/33  Time in range     45   %  % Time Above 180  39  % Time above 250  15  % Time Below 70 1     PRE-MEAL Fasting Lunch Dinner Bedtime Overall  Glucose range:       Averages:  146  159  247   185   POST-MEAL PC Breakfast PC Lunch PC Dinner  Glucose range:     Averages:  189  238  250       She was seen by diabetes educator in 9/19  Side effects from medications have been: None  Compliance with the medical regimen: Fair  Typical meal intake: Breakfast is oatmeal, otherwise eggs and toast.  Lunch may be rice and beans or a sandwich at 4 pm Fruit for snacks                Dietician visit, most recent: 09/2018 CDE consultation: 07/01/2018  Weight history:  Wt Readings from Last 3 Encounters:  05/02/21 95 lb 6.4 oz (43.3 kg)  03/22/21 95 lb (43.1 kg)  03/07/21 94 lb 3.2 oz (42.7 kg)    Glycemic control:   Lab Results  Component Value Date   HGBA1C 8.7 (H) 03/03/2021   HGBA1C 11.8 (H) 12/03/2020   HGBA1C 11.8 (H) 11/10/2020   Lab Results  Component Value Date   MICROALBUR 22.6 (H) 08/15/2020   LDLCALC 56 05/02/2021   CREATININE 1.41 (H) 05/02/2021   Lab Results  Component Value Date   MICRALBCREAT 17.4 08/15/2020    Lab Results  Component Value Date   FRUCTOSAMINE 484 (H) 09/23/2020   FRUCTOSAMINE 518 (H) 08/15/2020   FRUCTOSAMINE 369 (H) 03/29/2020    Office Visit on 05/02/2021  Component Date Value Ref Range Status    Cholesterol 05/02/2021 128  0 - 200 mg/dL Final   ATP III Classification       Desirable:  < 200 mg/dL               Borderline High:  200 - 239 mg/dL          High:  > = 240 mg/dL   Triglycerides 05/02/2021 64.0  0.0 - 149.0 mg/dL Final   Normal:  <150 mg/dLBorderline High:  150 - 199 mg/dL   HDL 05/02/2021 58.60  >39.00 mg/dL Final   VLDL 05/02/2021 12.8  0.0 - 40.0 mg/dL Final   LDL Cholesterol 05/02/2021 56  0 - 99 mg/dL Final   Total CHOL/HDL Ratio 05/02/2021 2   Final                  Men          Women1/2 Average Risk     3.4          3.3Average Risk          5.0          4.42X Average Risk          9.6          7.13X Average Risk          15.0          11.0                       NonHDL 05/02/2021 69.01   Final   NOTE:  Non-HDL goal should be 30 mg/dL higher than patient's LDL goal (i.e. LDL goal of < 70 mg/dL, would have non-HDL goal of < 100 mg/dL)   Sodium 05/02/2021 137  135 - 145 mEq/L Final   Potassium 05/02/2021 4.2  3.5 - 5.1 mEq/L Final   Chloride 05/02/2021 105  96 - 112 mEq/L Final   CO2 05/02/2021 25  19 - 32 mEq/L Final   Glucose, Bld 05/02/2021 171 (A) 70 - 99 mg/dL Final   BUN 05/02/2021 34 (A) 6 - 23 mg/dL Final   Creatinine, Ser 05/02/2021 1.41 (A) 0.40 - 1.20 mg/dL Final   GFR 05/02/2021 36.85 (A) >60.00 mL/min Final   Calculated using the CKD-EPI Creatinine Equation (2021)  Calcium 05/02/2021 9.9  8.4 - 10.5 mg/dL Final    Allergies as of 05/02/2021   No Known Allergies      Medication List        Accurate as of May 02, 2021  9:28 PM. If you have any questions, ask your nurse or doctor.          aspirin EC 81 MG tablet Take 1 tablet (81 mg total) by mouth daily. Swallow whole.   calcium carbonate 1500 (600 Ca) MG Tabs tablet Commonly known as: OSCAL Take 1 tablet by mouth 2 (two) times daily with a meal.   ezetimibe 10 MG tablet Commonly known as: Zetia Take 1 tablet (10 mg total) by mouth daily.   FreeStyle Libre 14 Day Sensor Misc Inject  1 each into the skin every 14 (fourteen) days.   glucose blood test strip Use as instructed to test blood sugar 3 times daily E11.65   Lantus SoloStar 100 UNIT/ML Solostar Pen Generic drug: insulin glargine Inject 16 Units into the skin daily. INJECT 16 UNITS UNDER THE SKIN ONCE DAILY. (UPDATED DOSAGE)   metoprolol tartrate 25 MG tablet Commonly known as: LOPRESSOR Take 0.5 tablets (12.5 mg total) by mouth 2 (two) times daily.   NovoLOG FlexPen 100 UNIT/ML FlexPen Generic drug: insulin aspart 6 UNITS BEFORE Breakfast on exercise days and if eating eggs 8 Units on other days LUNCH COVERAGE: NOVOLOG 8 UNITS if eating at home and 10 units for fast food Dinner 6 units Novolog before eating   oxyCODONE 5 MG immediate release tablet Commonly known as: Oxy IR/ROXICODONE TAKE 1 TABLET (5 MG TOTAL) BY MOUTH EVERY 4 (FOUR) HOURS AS NEEDED FOR SEVERE PAIN.   Ozempic (0.25 or 0.5 MG/DOSE) 2 MG/1.5ML Sopn Generic drug: Semaglutide(0.25 or 0.5MG /DOS) Inject 0.5 mg into the skin once a week. 0.25mg  weekly for 4 weeks then 0.5mg  weekly Started by: Elayne Snare, MD   pioglitazone 15 MG tablet Commonly known as: Actos Take 1 tablet (15 mg total) by mouth daily. Started by: Elayne Snare, MD   pregabalin 25 MG capsule Commonly known as: Lyrica Take 1 capsule (25 mg total) by mouth 2 (two) times daily.   rosuvastatin 20 MG tablet Commonly known as: CRESTOR Take 1 tablet (20 mg total) by mouth daily.        Allergies: No Known Allergies  Past Medical History:  Diagnosis Date   Chest pain 11/30/2020   Coronary artery disease    Diabetes mellitus without complication (HCC)    Glaucoma    History of chicken pox    Hypertension     Past Surgical History:  Procedure Laterality Date   BREAST BIOPSY Right 2017   benign   CARDIAC CATHETERIZATION     CATARACT EXTRACTION, BILATERAL Bilateral    CESAREAN SECTION     COLONOSCOPY  1980's   CORONARY ARTERY BYPASS GRAFT N/A 12/05/2020    Procedure: CORONARY ARTERY BYPASS GRAFTING (CABG), ON PUMP, TIMES FOUR, USING LEFT INTERNAL MAMMARY ARTERY AND BILATERAL ENDOSCOPICALLY HARVESTED GREATER SAPHENOUS VEINS;  Surgeon: [name], MD;  Location: [place];  Service: Open Heart Surgery;  Laterality: N/A;   LEFT HEART CATH AND CORONARY ANGIOGRAPHY N/A 11/30/2020   Procedure: LEFT HEART CATH AND CORONARY ANGIOGRAPHY;  Surgeon: Belva Crome, MD;  Location: [facility];  Service: Cardiovascular;  Laterality: N/A;   TEE WITHOUT CARDIOVERSION N/A 12/05/2020   Procedure: TRANSESOPHAGEAL ECHOCARDIOGRAM (TEE);  Surgeon: [name], MD;  Location: [place] [facility];  Service:  Open Heart Surgery;  Laterality: N/A;   TUBAL LIGATION      Family History  Problem Relation Age of Onset   Hypertension Mother    Diabetes Brother    CAD Brother    Hypertension Brother    Diabetes Brother    Hypertension Brother    Colon cancer Neg Hx    Esophageal cancer Neg Hx    Pancreatic cancer Neg Hx    Liver disease Neg Hx    Stomach cancer Neg Hx    Rectal cancer Neg Hx    Colon polyps Neg Hx     Social History:  reports that she has never smoked. She has never used smokeless tobacco. She reports that she does not drink alcohol and does not use drugs.   Review of Systems   Lipid history: Currently on Crestor 20 mg Previously had not been taking it regularly with LDL as high as 226 She is also supposed to be on Zetia but unclear if she is taking this  Lab Results  Component Value Date   CHOL 128 05/02/2021   CHOL 163 03/03/2021   CHOL 302 (H) 11/30/2020   Lab Results  Component Value Date   HDL 58.60 05/02/2021   HDL 66.80 03/03/2021   HDL 54 11/30/2020   Lab Results  Component Value Date   LDLCALC 56 05/02/2021   LDLCALC 77 03/03/2021   LDLCALC 226 (H) 11/30/2020   Lab Results  Component Value Date   TRIG 64.0 05/02/2021   TRIG 97.0 03/03/2021   TRIG 108 11/30/2020   Lab Results  Component Value Date   CHOLHDL 2  05/02/2021   CHOLHDL 2 03/03/2021   CHOLHDL 5.6 11/30/2020   No results found for: LDLDIRECT          Hypertension: Has been present for several years, previously on amlodipine and HCTZ  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   BP Readings from Last 3 Encounters:  05/02/21 (!) 164/70  03/22/21 140/70  03/07/21 118/78   Renal function appears to be variable  No history of microalbuminuria  Lab Results  Component Value Date   CREATININE 1.41 (H) 05/02/2021   CREATININE 1.58 (H) 03/03/2021   CREATININE 1.71 (H) 12/09/2020    Most recent eye exam was in 5/19  Most recent foot exam: 9/20  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  LABS:  Office Visit on 05/02/2021  Component Date Value Ref Range Status   Cholesterol 05/02/2021 128  0 - 200 mg/dL Final   ATP III Classification       Desirable:  < 200 mg/dL               Borderline High:  200 - 239 mg/dL          High:  > = 240 mg/dL   Triglycerides 05/02/2021 64.0  0.0 - 149.0 mg/dL Final   Normal:  <150 mg/dLBorderline High:  150 - 199 mg/dL   HDL 05/02/2021 58.60  >39.00 mg/dL Final   VLDL 05/02/2021 12.8  0.0 - 40.0 mg/dL Final   LDL Cholesterol 05/02/2021 56  0 - 99 mg/dL Final   Total CHOL/HDL Ratio 05/02/2021 2   Final                  Men          Women1/2 Average Risk     3.4          3.3Average Risk  5.0          4.42X Average Risk          9.6          7.13X Average Risk          15.0          11.0                       NonHDL 05/02/2021 69.01   Final   NOTE:  Non-HDL goal should be 30 mg/dL higher than patient's LDL goal (i.e. LDL goal of < 70 mg/dL, would have non-HDL goal of < 100 mg/dL)   Sodium 05/02/2021 137  135 - 145 mEq/L Final   Potassium 05/02/2021 4.2  3.5 - 5.1 mEq/L Final   Chloride 05/02/2021 105  96 - 112 mEq/L Final   CO2 05/02/2021 25  19 - 32 mEq/L Final   Glucose, Bld 05/02/2021 171 (A) 70 - 99 mg/dL Final   BUN 05/02/2021 34 (A) 6 - 23 mg/dL Final   Creatinine,  Ser 05/02/2021 1.41 (A) 0.40 - 1.20 mg/dL Final   GFR 05/02/2021 36.85 (A) >60.00 mL/min Final   Calculated using the CKD-EPI Creatinine Equation (2021)   Calcium 05/02/2021 9.9  8.4 - 10.5 mg/dL Final    Physical Examination:  BP (!) 164/70   Pulse 72   Ht 4\' 11"  (1.499 m)   Wt 95 lb 6.4 oz (43.3 kg)   SpO2 99%   BMI 19.27 kg/m        ASSESSMENT:  Diabetes type 2, insulin-dependent    A1c is now 8.7, previously 11.8  See history of present illness for discussion of current diabetes management, blood sugar patterns and problems identified  She is on basal bolus insulin only She still has periodically high postprandial readings and blood sugars are well over 200 in the afternoon and evening Only rarely will have blood sugars at target Not clear if she has had more stress since her coronary bypass surgery causing higher sugars including overnight Average blood sugar is higher and time within target is only 30% compared to 45  Not taking Actos as previously prescribed  Renal dysfunction: Improved  HYPERLIPIDEMIA: She is on Crestor 40 mg but not clear if she is taking her Zetia as prescribed She has CAD and her LDL target is at least under 70  PLAN:   She will start using Ozempic 0.25 mg weekly for better postprandial control and also long-term cardiovascular risk reduction She will call if she has any difficulty getting the prescription filled Discussed blood sugar targets both before and after meals  NOVOLOG doses will be 8 units with every meal but only reduce it to 6 units if Premeal blood sugar is below 120  She will make sure she is taking her Zetia 10 mg daily Check lipids today  Check blood sugars 4 times a day including after dinner New prescription for Actos has been sent since she is appearing not to be taking this, this should help her with insulin sensitivity as well as prevent weight loss  To call if she is having any unusual hypoglycemia   Patient  Instructions  Check blood sugars on waking up 7 days a week  Also check blood sugars about 2 hours after meals and do this after every meal  Recommended blood sugar levels on waking up are 90-130 and about 2 hours after meal is 130-180  Please bring your blood sugar monitor  to each visit, thank you  Novolog blue 8 units at breakfast, 8 units ac lunch, 8 units at supper  If sugar is <120 then reduce to 6 units  Start OZEMPIC injections by dialing 0.25 mg on the pen as shown once weekly on the same day of the week.   You may inject in the sides of the stomach, outer thigh or arm as indicated in the brochure given. If you have any difficulties using the pen see the video at [employer].co.za  You will feel fullness of the stomach with starting the medication and should try to keep the portions at meals small.  You may experience nausea in the first few days which usually gets better over time    After 4 weeks increase the dose to 0.5 mg weekly if no loss of appetite  If you have any questions or persistent side effects please call the office   Take  EZETEMIBE daily          Elayne Snare 05/02/2021, 9:28 PM   Note: This office note was prepared with Dragon voice recognition system technology. Any transcriptional errors that result from this process are unintentional.

## 2021-05-02 NOTE — Patient Instructions (Addendum)
Check blood sugars on waking up 7 days a week  Also check blood sugars about 2 hours after meals and do this after every meal  Recommended blood sugar levels on waking up are 90-130 and about 2 hours after meal is 130-180  Please bring your blood sugar monitor to each visit, thank you  Novolog blue 8 units at breakfast, 8 units ac lunch, 8 units at supper  If sugar is <120 then reduce to 6 units  Start OZEMPIC injections by dialing 0.25 mg on the pen as shown once weekly on the same day of the week.   You may inject in the sides of the stomach, outer thigh or arm as indicated in the brochure given. If you have any difficulties using the pen see the video at [employer]  You will feel fullness of the stomach with starting the medication and should try to keep the portions at meals small.  You may experience nausea in the first few days which usually gets better over time    After 4 weeks increase the dose to 0.5 mg weekly if no loss of appetite  If you have any questions or persistent side effects please call the office   Take  EZETEMIBE daily

## 2021-05-03 LAB — FRUCTOSAMINE: Fructosamine: 434 umol/L — ABNORMAL HIGH (ref 0–285)

## 2021-05-05 ENCOUNTER — Other Ambulatory Visit: Payer: Self-pay

## 2021-05-05 ENCOUNTER — Encounter (HOSPITAL_COMMUNITY)
Admission: RE | Admit: 2021-05-05 | Discharge: 2021-05-05 | Disposition: A | Discharge status: Home / Self Care | Payer: Medicare Other | Source: Ambulatory Visit | Attending: Cardiology | Admitting: Cardiology

## 2021-05-05 VITALS — BP 160/70 | HR 73 | Ht <= 58 in | Wt 95.7 lb

## 2021-05-05 DIAGNOSIS — I214 Non-ST elevation (NSTEMI) myocardial infarction: Secondary | ICD-10-CM | POA: Diagnosis not present

## 2021-05-05 DIAGNOSIS — Z951 Presence of aortocoronary bypass graft: Secondary | ICD-10-CM

## 2021-05-05 NOTE — Progress Notes (Signed)
Pt graduated from cardiac rehab program today with completion of 15 exercise sessions in Phase II. Pt maintained good attendance and progressed nicely during his participation in rehab as evidenced by increased MET level.  Medication list reconciled. Pt did state that she was unable to afford the Ozempic which would cost her 500.00.  Pt stated the prescribing provider is aware.  Pt continues to deny any psychosocial barriers or concerns. Repeat  PHQ score- 0.  Avalee  has made lifestyle changes and should be commended for her success. Pt feels he has achieved his goals during cardiac rehab.   Abigaile plans to continue exercise with walking 30 minutes 3 times a week. Cherre Huger, BSN Cardiac and Training and development officer

## 2021-05-09 ENCOUNTER — Other Ambulatory Visit: Payer: Medicare Other

## 2021-05-10 ENCOUNTER — Ambulatory Visit: Payer: Medicare Other | Admitting: Internal Medicine

## 2021-05-11 ENCOUNTER — Ambulatory Visit: Payer: Medicare Other | Admitting: Endocrinology

## 2021-05-16 ENCOUNTER — Other Ambulatory Visit: Payer: Self-pay

## 2021-05-17 ENCOUNTER — Ambulatory Visit (INDEPENDENT_AMBULATORY_CARE_PROVIDER_SITE_OTHER): Payer: Medicare Other | Admitting: Internal Medicine

## 2021-05-17 ENCOUNTER — Encounter: Payer: Self-pay | Admitting: Internal Medicine

## 2021-05-17 VITALS — BP 160/80 | HR 79 | Temp 98.8°F | Wt 94.8 lb

## 2021-05-17 DIAGNOSIS — I2510 Atherosclerotic heart disease of native coronary artery without angina pectoris: Secondary | ICD-10-CM

## 2021-05-17 DIAGNOSIS — I10 Essential (primary) hypertension: Secondary | ICD-10-CM | POA: Diagnosis not present

## 2021-05-17 DIAGNOSIS — E0800 Diabetes mellitus due to underlying condition with hyperosmolarity without nonketotic hyperglycemic-hyperosmolar coma (NKHHC): Secondary | ICD-10-CM

## 2021-05-17 DIAGNOSIS — I214 Non-ST elevation (NSTEMI) myocardial infarction: Secondary | ICD-10-CM

## 2021-05-17 DIAGNOSIS — E782 Mixed hyperlipidemia: Secondary | ICD-10-CM | POA: Diagnosis not present

## 2021-05-17 DIAGNOSIS — Z1231 Encounter for screening mammogram for malignant neoplasm of breast: Secondary | ICD-10-CM

## 2021-05-17 MED ORDER — METOPROLOL TARTRATE 25 MG PO TABS: 25.0000 mg | ORAL_TABLET | Freq: Two times a day (BID) | ORAL | 1 refills | Status: DC

## 2021-05-17 NOTE — Patient Instructions (Signed)
-  Nice seeing you today!!  -increase metoprolol to 25 mg (full tablet) twice daily.  -Schedule follow up in 8 weeks.

## 2021-05-17 NOTE — Progress Notes (Signed)
Established Patient Office Visit     This visit occurred during the SARS-CoV-2 public health emergency.  Safety protocols were in place, including screening questions prior to the visit, additional usage of staff PPE, and extensive cleaning of exam room while observing appropriate contact time as indicated for disinfecting solutions.    CC/Reason for Visit: Follow-up chronic medical conditions  HPI: Mariah Peterson is a 75 y.o. female who is coming in today for the above mentioned reasons. Past Medical History is significant for: Hypertension, hyperlipidemia, insulin-dependent diabetes followed by Dr. Dwyane Dee.  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  She is followed by thoracic surgeon Dr. Kipp Brood, her cardiologist is Dr. Marlou Porch.  A1c has improved from 11.8-8.7.  She tells me that her home blood pressures usually with systolics in the [id number] to [id number].  She is on metoprolol 12.5 mg twice daily and Lasix 40 mg daily.  She is otherwise doing well and has no acute concerns.   Past Medical/Surgical History: Past Medical History:  Diagnosis Date   Chest pain 11/30/2020   Coronary artery disease    Diabetes mellitus without complication (HCC)    Glaucoma    History of chicken pox    Hypertension     Past Surgical History:  Procedure Laterality Date   BREAST BIOPSY Right 2017   benign   CARDIAC CATHETERIZATION     CATARACT EXTRACTION, BILATERAL Bilateral    CESAREAN SECTION     COLONOSCOPY  1980's   CORONARY ARTERY BYPASS GRAFT N/A 12/05/2020   Procedure: CORONARY ARTERY BYPASS GRAFTING (CABG), ON PUMP, TIMES FOUR, USING LEFT INTERNAL MAMMARY ARTERY AND BILATERAL ENDOSCOPICALLY HARVESTED GREATER SAPHENOUS VEINS;  Surgeon: [name], MD;  Location: [facility];  Service: Open Heart Surgery;  Laterality: N/A;   LEFT HEART CATH AND CORONARY ANGIOGRAPHY N/A 11/30/2020   Procedure: LEFT HEART CATH  AND CORONARY ANGIOGRAPHY;  Surgeon: Belva Crome, MD;  Location: [facility];  Service: Cardiovascular;  Laterality: N/A;   TEE WITHOUT CARDIOVERSION N/A 12/05/2020   Procedure: TRANSESOPHAGEAL ECHOCARDIOGRAM (TEE);  Surgeon: [name], MD;  Location: [facility];  Service: Open Heart Surgery;  Laterality: N/A;   TUBAL LIGATION      Social History:  reports that she has never smoked. She has never used smokeless tobacco. She reports that she does not drink alcohol and does not use drugs.  Allergies: No Known Allergies  Family History:  Family History  Problem Relation Age of Onset   Hypertension Mother    Diabetes Brother    CAD Brother    Hypertension Brother    Diabetes Brother    Hypertension Brother    Colon cancer Neg Hx    Esophageal cancer Neg Hx    Pancreatic cancer Neg Hx    Liver disease Neg Hx    Stomach cancer Neg Hx    Rectal cancer Neg Hx    Colon polyps Neg Hx      Current Outpatient Medications:    aspirin EC 81 MG tablet, Take 1 tablet (81 mg total) by mouth daily. Swallow whole., Disp: 90 tablet, Rfl: 3   calcium carbonate (OSCAL) 1500 (600 Ca) MG TABS tablet, Take 1 tablet by mouth 2 (two) times daily with a meal., Disp: , Rfl:    Continuous Blood Gluc Sensor (FREESTYLE LIBRE 14 DAY SENSOR) MISC, Inject 1 each into  the skin every 14 (fourteen) days., Disp: 2 each, Rfl: 5   ezetimibe (ZETIA) 10 MG tablet, Take 1 tablet (10 mg total) by mouth daily., Disp: 90 tablet, Rfl: 3   glucose blood test strip, Use as instructed to test blood sugar 3 times daily E11.65, Disp: 270 each, Rfl: 1   insulin aspart (NOVOLOG FLEXPEN) 100 UNIT/ML FlexPen, 6 UNITS BEFORE Breakfast on exercise days and if eating eggs 8 Units on other days LUNCH COVERAGE: NOVOLOG 8 UNITS if eating at home and 10 units for fast food Dinner 6 units Novolog before eating, Disp: 15 mL, Rfl: 3   insulin glargine (LANTUS SOLOSTAR) 100 UNIT/ML Solostar Pen, Inject 16 Units into the skin daily.  INJECT 16 UNITS UNDER THE SKIN ONCE DAILY. (UPDATED DOSAGE), Disp: 15 mL, Rfl: 3   pioglitazone (ACTOS) 15 MG tablet, Take 1 tablet (15 mg total) by mouth daily., Disp: 30 tablet, Rfl: 1   pregabalin (LYRICA) 25 MG capsule, Take 1 capsule (25 mg total) by mouth 2 (two) times daily., Disp: 60 capsule, Rfl: 0   rosuvastatin (CRESTOR) 20 MG tablet, Take 1 tablet (20 mg total) by mouth daily., Disp: 30 tablet, Rfl: 5   Semaglutide,0.25 or 0.'5MG'$ /DOS, (OZEMPIC, 0.25 OR 0.5 MG/DOSE,) 2 MG/1.5ML SOPN, Inject 0.5 mg into the skin once a week. 0.'25mg'$  weekly for 4 weeks then 0.'5mg'$  weekly, Disp: 1.5 mL, Rfl: 2   metoprolol tartrate (LOPRESSOR) 25 MG tablet, Take 1 tablet (25 mg total) by mouth 2 (two) times daily., Disp: 90 tablet, Rfl: 1  Current Facility-Administered Medications:    diclofenac Sodium (VOLTAREN) 1 % topical gel 2 g, 2 g, Topical, TID, Lars Pinks M, PA-C  Review of Systems:  Constitutional: Denies fever, chills, diaphoresis, appetite change and fatigue.  HEENT: Denies photophobia, eye pain, redness, hearing loss, ear pain, congestion, sore throat, rhinorrhea, sneezing, mouth sores, trouble swallowing, neck pain, neck stiffness and tinnitus.   Respiratory: Denies SOB, DOE, cough, chest tightness,  and wheezing.   Cardiovascular: Denies chest pain, palpitations and leg swelling.  Gastrointestinal: Denies nausea, vomiting, abdominal pain, diarrhea, constipation, blood in stool and abdominal distention.  Genitourinary: Denies dysuria, urgency, frequency, hematuria, flank pain and difficulty urinating.  Endocrine: Denies: hot or cold intolerance, sweats, changes in hair or nails, polyuria, polydipsia. Musculoskeletal: Denies myalgias, back pain, joint swelling, arthralgias and gait problem.  Skin: Denies pallor, rash and wound.  Neurological: Denies dizziness, seizures, syncope, weakness, light-headedness, numbness and headaches.  Hematological: Denies adenopathy. Easy bruising,  personal or family bleeding history  Psychiatric/Behavioral: Denies suicidal ideation, mood changes, confusion, nervousness, sleep disturbance and agitation    Physical Exam: Vitals:   05/17/21 1032  BP: (!) 160/80  Pulse: 79  Temp: 98.8 F (37.1 C)  TempSrc: Oral  SpO2: 99%  Weight: 94 lb 12.8 oz (43 kg)    Body mass index is 21.07 kg/m.   Constitutional: NAD, calm, comfortable Eyes: PERRL, lids and conjunctivae normal ENMT: Mucous membranes are moist.  Respiratory: clear to auscultation bilaterally, no wheezing, no crackles. Normal respiratory effort. No accessory muscle use.  Cardiovascular: Regular rate and rhythm, no murmurs / rubs / gallops. No extremity edema.  Neurologic: Grossly intact and nonfocal Psychiatric: Normal judgment and insight. Alert and oriented x 3. Normal mood.    Impression and Plan:  Encounter for screening mammogram for malignant neoplasm of breast  - Plan: MM Digital Screening  Primary hypertension  - Plan: metoprolol tartrate (LOPRESSOR) 25 MG tablet -Not well controlled, increase metoprolol from 12.5  to 25 mg twice daily.  She will schedule follow-up in 8 weeks.  Mixed hyperlipidemia -Last lipid panel with a total cholesterol of 128, triglycerides 64 and LDL 56.  She is on rosuvastatin 20 mg at bedtime.  Coronary artery disease -With non-ST elevated MI in February 2022, followed by cardiology.  Diabetes mellitus due to underlying condition with hyperosmolarity without coma, unspecified whether long term insulin use (HCC) -Followed by Dr. Dwyane Dee, A1c has improved to 8.7.  Time spent: 32 minutes reviewing chart, interviewing and examining patient and formulating plan of care.   Patient Instructions  -Nice seeing you today!!  -increase metoprolol to 25 mg (full tablet) twice daily.  -Schedule follow up in 8 weeks.    Lelon Frohlich, MD [facility]

## 2021-05-18 NOTE — Progress Notes (Signed)
Discharge Progress Report  Patient Details  Peterson: Mariah Peterson MRN: [id number] Date of Birth: 1946/06/27 Referring Provider:   Flowsheet Row CARDIAC REHAB PHASE II ORIENTATION from 02/23/2021 in [place]  Referring Provider Dr Candee Furbish MD        Number of Visits: 16  Reason for Discharge:  Patient reached a stable level of exercise. Patient independent in their exercise. Patient has met program and personal goals.  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  ADL UCSD:   Initial Exercise Prescription:  Initial Exercise Prescription - 02/23/21 1100       Date of Initial Exercise RX and Referring Provider   Date 02/23/21    Referring Provider Dr Candee Furbish MD    Expected Discharge Date 04/21/21      Recumbant Bike   Level 1.5    RPM 80    Minutes 15    METs 2.3      NuStep   Level 2    SPM 80    Minutes 15    METs 2.3      Prescription Details   Frequency (times per week) 3    Duration Progress to 30 minutes of continuous aerobic without signs/symptoms of physical distress      Intensity   THRR 40-80% of Max Heartrate 59-118    Ratings of Perceived Exertion 11-13    Perceived Dyspnea 0-4      Progression   Progression Continue progressive overload as per policy without signs/symptoms or physical distress.      Resistance Training   Training Prescription Yes    Weight 3    Reps 10-15             Discharge Exercise Prescription (Final Exercise Prescription Changes):  Exercise Prescription Changes - 05/05/21 1056       Response to Exercise   Blood Pressure (Admit) 160/70    Blood Pressure (Exercise) 172/72    Blood Pressure (Exit) 142/78    Heart Rate (Admit) 73 bpm    Heart Rate (Exercise) 108 bpm    Heart Rate (Exit) 73 bpm    Rating of Perceived Exertion (Exercise) 11    Symptoms none     Comments Last exercise session. Walk test and measurements performed.    Duration Continue with 30 min of aerobic exercise without signs/symptoms of physical distress.    Intensity THRR unchanged      Progression   Progression Continue to progress workloads to maintain intensity without signs/symptoms of physical distress.    Average METs 3.3      Resistance Training   Training Prescription No      Interval Training   Interval Training No      NuStep   Level 3    SPM 85    Minutes 25    METs 3.2      Track   Laps 8   1772 feet   Minutes 6   6-minute walk test   METs 3.5      Home Exercise Plan   Plans to continue exercise at Home (comment)    Frequency Add 2 additional days to program exercise sessions.    Initial Home Exercises Provided 03/29/21             Functional Capacity:  6 Minute Walk  Mariah Peterson 02/23/21 1136 05/05/21 1053       6 Minute Walk   Phase -- Discharge    Distance 1461 feet 1722 feet    Distance % Change -- 17.86 %    Distance Feet Change -- 261 ft    Walk Time 6 minutes 6 minutes    # of Rest Breaks 0 0    MPH 2.77 3.26    METS 2.04 3.85    RPE 11 11    Perceived Dyspnea  0 0    VO2 Peak 10.63 13.46    Symptoms No No    Resting HR 80 bpm 73 bpm    Resting BP 142/70 160/70    Resting Oxygen Saturation  98 % --    Exercise Oxygen Saturation  during 6 min walk 100 % --    Max Ex. HR 90 bpm 108 bpm    Max Ex. BP 138/72 172/72    2 Minute Post BP 134/68 142/78             Psychological, QOL, Others - Outcomes: PHQ 2/9: Depression screen [facility] 2/9 05/05/2021 02/23/2021 02/23/2021 10/19/2020 10/10/2020  Decreased Interest 0 0 0 0 0  Down, Depressed, Hopeless 0 0 0 0 0  PHQ - 2 Score 0 0 0 0 0  Altered sleeping 1 - - - -  Tired, decreased energy 0 - - - -  Change in appetite 0 - - - -  Feeling bad or failure about yourself  0 - - - -  Trouble concentrating 0 - - - -  Moving slowly or fidgety/restless 0 - - - -  Suicidal thoughts  0 - - - -  PHQ-9 Score 1 - - - -  Difficult doing work/chores Not difficult at all - - - -    Quality of Life:  Quality of Life - 05/05/21 1628       Quality of Life   Select Quality of Life      Quality of Life Scores   Health/Function Pre 25.6 %    Health/Function Post 27.82 %    Health/Function % Change 8.67 %    Socioeconomic Pre 26.57 %    Socioeconomic Post 30 %    Socioeconomic % Change  12.91 %    Psych/Spiritual Pre 23.14 %    Psych/Spiritual Post 28.17 %    Psych/Spiritual % Change 21.74 %    Family Pre 25.2 %    Family Post 25.8 %    Family % Change 2.38 %    GLOBAL Pre 25.24 %    GLOBAL Post 27.98 %    GLOBAL % Change 10.86 %             Personal Goals: Goals established at orientation with interventions provided to work toward goal.  Personal Goals and Risk Factors at Admission - 02/23/21 1148       Core Components/Risk Factors/Patient Goals on Admission    Weight Management Weight Maintenance;Yes    Intervention Weight Management: Develop a combined nutrition and exercise program designed to reach desired caloric intake, while maintaining appropriate intake of nutrient and fiber, sodium and fats, and appropriate energy expenditure required for the weight goal.;Weight Management: Provide education and appropriate resources to help participant work on and attain dietary goals.    Expected Outcomes Short Term: Continue to assess and modify interventions until short term weight is achieved;Long Term: Adherence to nutrition and physical activity/exercise program aimed toward attainment of established weight  goal;Weight Maintenance: Understanding of the daily nutrition guidelines, which includes 25-35% calories from fat, 7% or less cal from saturated fats, less than 212m cholesterol, less than 1.5gm of sodium, & 5 or more servings of fruits and vegetables daily    Diabetes Yes    Intervention Provide education about signs/symptoms and action to take for  hypo/hyperglycemia.;Provide education about proper nutrition, including hydration, and aerobic/resistive exercise prescription along with prescribed medications to achieve blood glucose in normal ranges: Fasting glucose 65-99 mg/dL    Expected Outcomes Short Term: Participant verbalizes understanding of the signs/symptoms and immediate care of hyper/hypoglycemia, proper foot care and importance of medication, aerobic/resistive exercise and nutrition plan for blood glucose control.;Long Term: Attainment of HbA1C < 7%.    Hypertension Yes    Intervention Provide education on lifestyle modifcations including regular physical activity/exercise, weight management, moderate sodium restriction and increased consumption of fresh fruit, vegetables, and low fat dairy, alcohol moderation, and smoking cessation.;Monitor prescription use compliance.    Expected Outcomes Short Term: Continued assessment and intervention until BP is < 140/958mHG in hypertensive participants. < 130/807mG in hypertensive participants with diabetes, heart failure or chronic kidney disease.;Long Term: Maintenance of blood pressure at goal levels.    Lipids Yes    Intervention Provide education and support for participant on nutrition & aerobic/resistive exercise along with prescribed medications to achieve LDL <43m68mDL >40mg75m Expected Outcomes Short Term: Participant states understanding of desired cholesterol values and is compliant with medications prescribed. Participant is following exercise prescription and nutrition guidelines.;Long Term: Cholesterol controlled with medications as prescribed, with individualized exercise RX and with personalized nutrition plan. Value goals: LDL < 43mg,65m > 40 mg.    Stress Yes    Intervention Offer individual and/or small group education and counseling on adjustment to heart disease, stress management and health-related lifestyle change. Teach and support self-help strategies.;Refer  participants experiencing significant psychosocial distress to appropriate mental health specialists for further evaluation and treatment. When possible, include family members and significant others in education/counseling sessions.    Expected Outcomes Short Term: Participant demonstrates changes in health-related behavior, relaxation and other stress management skills, ability to obtain effective social support, and compliance with psychotropic medications if prescribed.;Long Term: Emotional wellbeing is indicated by absence of clinically significant psychosocial distress or social isolation.    Personal Goal Other Yes    Personal Goal Pt wants to feel safe and independant. She also wants to regain strength and feel good about her heart health.    Intervention Will progress workloads as tolerated without sign or symptom    Expected Outcomes Pt will achieve her goals within the [facility] [place] and feel better about her health              Personal Goals Discharge:  Goals and Risk Factor Review     Row Peterson 03/01/21 1545 03/14/21 1145 04/11/21 1607 05/05/21 1219       Core Components/Risk Factors/Patient Goals Review   Personal Goals Review Weight Management/Obesity;Stress;Hypertension;Lipids;Diabetes Weight Management/Obesity;Stress;Hypertension;Lipids;Diabetes Weight Management/Obesity;Stress;Hypertension;Lipids;Diabetes Weight Management/Obesity;Stress;Hypertension;Lipids;Diabetes    Review Khristian started exercise on 03/01/25/57/90lic BP's were in the 140's to 160's. CBG's were stable today Sheniqua continues to do well with exercise. Celeste has had some variations in her CBG's and BP's will continue to monitor. Shagun's insulin has been decreased by Dr Kumar Rozelle Logannues to do well with exercise. Gavriela has had some variations in her CBG's and BP's will continue to monitor. Keely's recent CBg's have been  elevated prior to exercise. Will continue to monitor CBG's. Dareen graduates today with the completion  of 15 exercise sessions. Alura demonstrated CBG WNL during the last couple of weeks.  although Christe did not gain the weight she desired she was able to maintain her weight with no decrease in her weight as before.    Expected Outcomes Juanita will continue to participate in phase 2 cardiac rehab for exercise, nutrtion and lifestyle modifications Hye will continue to participate in phase 2 cardiac rehab for exercise, nutrtion and lifestyle modifications Chelsia will continue to participate in phase 2 cardiac rehab for exercise, nutrtion and lifestyle modifications [place] has adopted a heart healthy lifestyle and diabetes management             Exercise Goals and Review:  Exercise Goals     Row Peterson 02/23/21 1145             Exercise Goals   Increase Physical Activity Yes       Intervention Provide advice, education, support and counseling about physical activity/exercise needs.;Develop an individualized exercise prescription for aerobic and resistive training based on initial evaluation findings, risk stratification, comorbidities and participant's personal goals.       Expected Outcomes Short Term: Attend rehab on a regular basis to increase amount of physical activity.;Long Term: Add in home exercise to make exercise part of routine and to increase amount of physical activity.;Long Term: Exercising regularly at least 3-5 days a week.       Increase Strength and Stamina Yes       Intervention Provide advice, education, support and counseling about physical activity/exercise needs.;Develop an individualized exercise prescription for aerobic and resistive training based on initial evaluation findings, risk stratification, comorbidities and participant's personal goals.       Expected Outcomes Short Term: Increase workloads from initial exercise prescription for resistance, speed, and METs.;Short Term: Perform resistance training exercises routinely during rehab and add in resistance training at  home;Long Term: Improve cardiorespiratory fitness, muscular endurance and strength as measured by increased METs and functional capacity (6MWT)       Able to understand and use rate of perceived exertion (RPE) scale Yes       Intervention Provide education and explanation on how to use RPE scale       Expected Outcomes Short Term: Able to use RPE daily in rehab to express subjective intensity level;Long Term:  Able to use RPE to guide intensity level when exercising independently       Knowledge and understanding of Target Heart Rate Range (THRR) Yes       Intervention Provide education and explanation of THRR including how the numbers were predicted and where they are located for reference       Expected Outcomes Short Term: Able to state/look up THRR;Long Term: Able to use THRR to govern intensity when exercising independently;Short Term: Able to use daily as guideline for intensity in rehab       Understanding of Exercise Prescription Yes       Intervention Provide education, explanation, and written materials on patient's individual exercise prescription       Expected Outcomes Short Term: Able to explain program exercise prescription;Long Term: Able to explain home exercise prescription to exercise independently                Exercise Goals Re-Evaluation:  Exercise Goals Re-Evaluation     Row Peterson 03/01/21 1214 03/13/21 1701 03/29/21 1114 04/10/21 1137 04/26/21 1149  Exercise Goal Re-Evaluation   Exercise Goals Review Increase Physical Activity;Able to understand and use rate of perceived exertion (RPE) scale -- Increase Physical Activity;Able to understand and use rate of perceived exertion (RPE) scale;Increase Strength and Stamina;Knowledge and understanding of Target Heart Rate Range (THRR);Understanding of Exercise Prescription Increase Physical Activity;Able to understand and use rate of perceived exertion (RPE) scale;Increase Strength and Stamina;Knowledge and understanding of  Target Heart Rate Range (THRR);Understanding of Exercise Prescription Increase Physical Activity;Able to understand and use rate of perceived exertion (RPE) scale;Increase Strength and Stamina;Knowledge and understanding of Target Heart Rate Range (THRR);Understanding of Exercise Prescription   Comments Patient able to understand and use RPE scale appropriately. Patient called out today due to COVID 19 exposeure, unable to review goals. Reviewed home exercise guidelines. Patient plans to walk 30 minutes, 1-2 days/week in addition to exercise at cardiac rehab. Patient plans to use cans for her resistance training 1-2 days/week. Patient's goal is to feel better. Patient is doing some walking at home. Instructed patient on how to manually check pulse. Patient making steady progress with exercise at cardiac rehab. Blood pressure elevated at check-in today. Patient rested and warmed up on the track. BP better upon recheck. Patient exercised on the NuStep machine for 25 minutes, no recumbent bike today.   Expected Outcomes Increase workloads as tolerated to help increase strength and stamina. Will review goals with patient upon return to exercise. Patient will add 1-2 days of walking at home in addition to exercise at cardiac rehab to help increase strength and stamina. Patient will continue walking at home. Patient will continue to practice pulse coutning. Continue to monitor blood pressure. Progress workloads as tolerated.    [place] Peterson 05/05/21 1202             Exercise Goal Re-Evaluation   Exercise Goals Review Increase Physical Activity;Able to understand and use rate of perceived exertion (RPE) scale;Increase Strength and Stamina;Knowledge and understanding of Target Heart Rate Range (THRR);Understanding of Exercise Prescription       Comments Patient completed the cardiac rehab program and progressed well achieving 3.3 METs with exercise. Patient's functional capacity increased 18% as measured by 6MWT,  and patient feels that her strength and stamina have imporved. Patient plans to walk 30 minutes at least 3 days/week as her mode of exercise.       Expected Outcomes Patient will walk 30 minutes at least 3 days/week to maintain health and fitness gains.                Nutrition & Weight - Outcomes:  Pre Biometrics - 02/23/21 1133       Pre Biometrics   Waist Circumference 30.25 inches    Hip Circumference 33.5 inches    Waist to Hip Ratio 0.9 %    Triceps Skinfold 29 mm    % Body Fat 35 %    Grip Strength 24 kg    Flexibility 13 in    Single Leg Stand 5 seconds             Post Biometrics - 05/05/21 1051        Post  Biometrics   Height 4' 8.25" (1.429 m)    Waist Circumference 30 inches    Hip Circumference 33.25 inches    Waist to Hip Ratio 0.9 %    BMI (Calculated) 21.25    Triceps Skinfold 22.5 mm    % Body Fat 34.3 %    Grip Strength 18 kg  Flexibility 13 in    Single Leg Stand 1.37 seconds             Nutrition:  Nutrition Therapy & Goals - 03/08/21 1424       Nutrition Therapy   Diet TLC; carb modified      Personal Nutrition Goals   Nutrition Goal Pt to maintain or gain weight during cardiac rehab    Personal Goal #2 Pt to monitor CGM minimum 4 times per day    Personal Goal #3 Pt to learn to estimate small, medium, and large carbohydrate meals for more consistent carb intake across day    Personal Goal #4 Improved blood glucose control as evidenced by pt's A1c trending from 8.7 toward less than 7.0.      Intervention Plan   Intervention Prescribe, educate and counsel regarding individualized specific dietary modifications aiming towards targeted core components such as weight, hypertension, lipid management, diabetes, heart failure and other comorbidities.;Nutrition handout(s) given to patient.    Expected Outcomes Long Term Goal: Adherence to prescribed nutrition plan.;Short Term Goal: A plan has been developed with personal nutrition goals  set during dietitian appointment.             Nutrition Discharge:   Education Questionnaire Score:  Knowledge Questionnaire Score - 05/05/21 1202       Knowledge Questionnaire Score   Pre Score 20/24    Post Score 17/24             Goals reviewed with patient; copy given to patient.Darya graduated from cardiac rehab program on 05/05/21 with completion of 16 exercise sessions in Phase II. Pt maintained good attendance and progressed nicely during his participation in rehab as evidenced by increased MET level. Leinaala increased her distance on her post exercise walk test by 261 feet.   Medication list reconciled. Repeat  PHQ score-  0.  Pt has made significant lifestyle changes and should be commended for her success. Pt feels she has achieved her goals during cardiac rehab.   Pt plans to continue exercise by walking for 30 minutes 3 days a week. We are proud of Sabria's progress!Barnet Pall, RN,BSN 05/18/2021 10:50 AM

## 2021-06-03 ENCOUNTER — Other Ambulatory Visit: Payer: Self-pay | Admitting: Endocrinology

## 2021-06-13 ENCOUNTER — Encounter: Payer: Self-pay | Admitting: Endocrinology

## 2021-06-13 ENCOUNTER — Ambulatory Visit (INDEPENDENT_AMBULATORY_CARE_PROVIDER_SITE_OTHER): Payer: Medicare Other | Admitting: Endocrinology

## 2021-06-13 ENCOUNTER — Other Ambulatory Visit: Payer: Self-pay

## 2021-06-13 VITALS — BP 130/80 | HR 68 | Ht 59.0 in | Wt 98.0 lb

## 2021-06-13 DIAGNOSIS — Z794 Long term (current) use of insulin: Secondary | ICD-10-CM

## 2021-06-13 DIAGNOSIS — I2510 Atherosclerotic heart disease of native coronary artery without angina pectoris: Secondary | ICD-10-CM | POA: Diagnosis not present

## 2021-06-13 DIAGNOSIS — I10 Essential (primary) hypertension: Secondary | ICD-10-CM | POA: Diagnosis not present

## 2021-06-13 DIAGNOSIS — E1165 Type 2 diabetes mellitus with hyperglycemia: Secondary | ICD-10-CM | POA: Diagnosis not present

## 2021-06-13 DIAGNOSIS — E7800 Pure hypercholesterolemia, unspecified: Secondary | ICD-10-CM | POA: Diagnosis not present

## 2021-06-13 LAB — POCT GLYCOSYLATED HEMOGLOBIN (HGB A1C): Hemoglobin A1C: 8.5 % — AB (ref 4.0–5.6)

## 2021-06-13 NOTE — Patient Instructions (Signed)
Any time with smaller meals or less starch reduce Novolog by 2 units  Reduce supper dose to 5 Novolog  Check sugar at supper and bedtime daily  Compare sugar with stip meter  Call for [place] [facility]

## 2021-06-13 NOTE — Progress Notes (Signed)
Patient ID: Mariah Peterson, female   DOB: 06-Jan-1946, 75 y.o.   MRN: [id number]          Reason for Appointment: Follow-up    History of Present Illness:          Date of diagnosis of type 2 diabetes mellitus:?  1990       Background history:   She was started on insulin about 4 years ago and previously taking metformin and glipizide No detailed history is available and she does not think she has taken any other types of diabetes medications or injections  Recent history:    INSULIN regimen is: Lantus 16 units daily in am, Novolog 6-8 units at breakfast, 8 units ac lunch  , 6 acs  Non-insulin hypoglycemic drugs the patient is taking are:   Ozempic 0.25 mg weekly,  Current management, blood sugar patterns and problems identified:  Her A1c is about the same at 8.5  Fructosamine is last 484   She is getting much better blood sugar control with restarting Ozempic we will add 0.25 mg weekly  Previously her blood sugars were averaging 224 and only 30% in range, now up to 68% time in range  No side effects or weight loss with Ozempic  She has actually gained back a lot of weight  Blood sugar monitoring is inadequate especially in the evenings after supper, blood sugar patterns are as below; this is despite reminders to check blood sugars consistently at all times  Not clear why some days her blood sugars are much higher after breakfast even though she is getting some protein along with grits in the morning usually, occasionally may have a higher fat food like liver pudding  She is likely getting too much insulin and occasionally at dinnertime with low normal readings postprandially at times although sometimes she may get enough carbohydrate like a sandwich She has finished her exercise program with cardiac rehab      Glucose sensor: Freestyle libre   Interpretation of the download as follows  Blood sugar data is incomplete and sparse between 4 PM and 10 PM approximately   hyperglycemia is requiring periodically after breakfast and lunch and may be late evening, this is mostly postprandial Blood sugars may occasionally go up over 200 after breakfast and lunch and on an average arising at least 60 mg between early morning and midday Only once or twice has had no sugars documented overnight or before 2 PM Overnight blood sugars are somewhat high with average 180 at midnight and progressively decreasing to an average of 125, low sugar occurred only twice on 8/10 and 8/11  CGM use % of time 69  2-week average/GV 156  Time in range    68    %  % Time Above 180 25+4  % Time above 250   % Time Below 70 3     PRE-MEAL Fasting Lunch Dinner Bedtime Overall  Glucose range:       Averages: 25  125 173    POST-MEAL PC Breakfast PC Lunch PC Dinner  Glucose range:     Averages: 187 ?  159 136   Prior   CGM use % of time 65  2-week average/GV 224  Time in range    30    % was 45  % Time Above 180 37  % Time above 250 0  % Time Below 70        She was seen by diabetes educator in 9/19  Side effects from medications have been: None  Compliance with the medical regimen: Fair  Typical meal intake: Breakfast is oatmeal, otherwise eggs and toast.  Lunch may be rice and beans or a sandwich at 4 pm Fruit for snacks                Dietician visit, most recent: 09/2018 CDE consultation: 07/01/2018  Weight history:  Wt Readings from Last 3 Encounters:  06/13/21 98 lb (44.5 kg)  05/17/21 94 lb 12.8 oz (43 kg)  05/05/21 95 lb 10.9 oz (43.4 kg)    Glycemic control:   Lab Results  Component Value Date   HGBA1C 8.5 (A) 06/13/2021   HGBA1C 8.7 (H) 03/03/2021   HGBA1C 11.8 (H) 12/03/2020   Lab Results  Component Value Date   MICROALBUR 22.6 (H) 08/15/2020   LDLCALC 56 05/02/2021   CREATININE 1.41 (H) 05/02/2021   Lab Results  Component Value Date   MICRALBCREAT 17.4 08/15/2020    Lab Results  Component Value Date   FRUCTOSAMINE 434 (H)  05/02/2021   FRUCTOSAMINE 484 (H) 09/23/2020   FRUCTOSAMINE 518 (H) 08/15/2020    Office Visit on 06/13/2021  Component Date Value Ref Range Status   Hemoglobin A1C 06/13/2021 8.5 (A) 4.0 - 5.6 % Final    Allergies as of 06/13/2021   No Known Allergies      Medication List        Accurate as of June 13, 2021 11:59 PM. If you have any questions, ask your nurse or doctor.          aspirin EC 81 MG tablet Take 1 tablet (81 mg total) by mouth daily. Swallow whole.   calcium carbonate 1500 (600 Ca) MG Tabs tablet Commonly known as: OSCAL Take 1 tablet by mouth 2 (two) times daily with a meal.   ezetimibe 10 MG tablet Commonly known as: Zetia Take 1 tablet (10 mg total) by mouth daily.   FreeStyle Libre 14 Day Sensor Misc Inject 1 each into the skin every 14 (fourteen) days.   glucose blood test strip Use as instructed to test blood sugar 3 times daily E11.65   Lantus SoloStar 100 UNIT/ML Solostar Pen Generic drug: insulin glargine Inject 16 Units into the skin daily. INJECT 16 UNITS UNDER THE SKIN ONCE DAILY. (UPDATED DOSAGE)   metoprolol tartrate 25 MG tablet Commonly known as: LOPRESSOR Take 1 tablet (25 mg total) by mouth 2 (two) times daily.   NovoLOG FlexPen 100 UNIT/ML FlexPen Generic drug: insulin aspart 6 UNITS BEFORE Breakfast on exercise days and if eating eggs 8 Units on other days LUNCH COVERAGE: NOVOLOG 8 UNITS if eating at home and 10 units for fast food Dinner 6 units Novolog before eating   Ozempic (0.25 or 0.5 MG/DOSE) 2 MG/1.5ML Sopn Generic drug: Semaglutide(0.25 or 0.'5MG'$ /DOS) Inject 0.5 mg into the skin once a week. 0.'25mg'$  weekly for 4 weeks then 0.'5mg'$  weekly   pioglitazone 15 MG tablet Commonly known as: ACTOS Take 1 tablet by mouth once daily   pregabalin 25 MG capsule Commonly known as: Lyrica Take 1 capsule (25 mg total) by mouth 2 (two) times daily.   rosuvastatin 20 MG tablet Commonly known as: CRESTOR Take 1 tablet (20 mg  total) by mouth daily.        Allergies: No Known Allergies  Past Medical History:  Diagnosis Date   Chest pain 11/30/2020   Coronary artery disease    Diabetes mellitus without complication (HCC)    Glaucoma  History of chicken pox    Hypertension     Past Surgical History:  Procedure Laterality Date   BREAST BIOPSY Right 2017   benign   CARDIAC CATHETERIZATION     CATARACT EXTRACTION, BILATERAL Bilateral    CESAREAN SECTION     COLONOSCOPY  1980's   CORONARY ARTERY BYPASS GRAFT N/A 12/05/2020   Procedure: CORONARY ARTERY BYPASS GRAFTING (CABG), ON PUMP, TIMES FOUR, USING LEFT INTERNAL MAMMARY ARTERY AND BILATERAL ENDOSCOPICALLY HARVESTED GREATER SAPHENOUS VEINS;  Surgeon: [name], MD;  Location: [facility];  Service: Open Heart Surgery;  Laterality: N/A;   LEFT HEART CATH AND CORONARY ANGIOGRAPHY N/A 11/30/2020   Procedure: LEFT HEART CATH AND CORONARY ANGIOGRAPHY;  Surgeon: Belva Crome, MD;  Location: [facility];  Service: Cardiovascular;  Laterality: N/A;   TEE WITHOUT CARDIOVERSION N/A 12/05/2020   Procedure: TRANSESOPHAGEAL ECHOCARDIOGRAM (TEE);  Surgeon: [name], MD;  Location: [facility];  Service: Open Heart Surgery;  Laterality: N/A;   TUBAL LIGATION      Family History  Problem Relation Age of Onset   Hypertension Mother    Diabetes Brother    CAD Brother    Hypertension Brother    Diabetes Brother    Hypertension Brother    Colon cancer Neg Hx    Esophageal cancer Neg Hx    Pancreatic cancer Neg Hx    Liver disease Neg Hx    Stomach cancer Neg Hx    Rectal cancer Neg Hx    Colon polyps Neg Hx     Social History:  reports that she has never smoked. She has never used smokeless tobacco. She reports that she does not drink alcohol and does not use drugs.   Review of Systems   Lipid history: Currently on Crestor 20 mg Previously had not been taking it regularly with LDL as high as 226 She is also supposed to be on  Zetia  LDL recently below 70  Lab Results  Component Value Date   CHOL 128 05/02/2021   CHOL 163 03/03/2021   CHOL 302 (H) 11/30/2020   Lab Results  Component Value Date   HDL 58.60 05/02/2021   HDL 66.80 03/03/2021   HDL 54 11/30/2020   Lab Results  Component Value Date   LDLCALC 56 05/02/2021   LDLCALC 77 03/03/2021   LDLCALC 226 (H) 11/30/2020   Lab Results  Component Value Date   TRIG 64.0 05/02/2021   TRIG 97.0 03/03/2021   TRIG 108 11/30/2020   Lab Results  Component Value Date   CHOLHDL 2 05/02/2021   CHOLHDL 2 03/03/2021   CHOLHDL 5.6 11/30/2020   No results found for: LDLDIRECT          Hypertension: Has been present for several years, previously on amlodipine and HCTZ  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, recently increase the dose to 25 bid Does not check at home  BP Readings from Last 3 Encounters:  06/13/21 130/80  05/17/21 (!) 160/80  05/05/21 (!) 160/70   Renal function appears to be variable but generally improving  No history of microalbuminuria  Lab Results  Component Value Date   CREATININE 1.41 (H) 05/02/2021   CREATININE 1.58 (H) 03/03/2021   CREATININE 1.71 (H) 12/09/2020    Most recent eye exam was in 5/19  Most recent foot exam: 9/20  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  LABS:  Office Visit on 06/13/2021  Component Date Value Ref Range Status  Hemoglobin A1C 06/13/2021 8.5 (A) 4.0 - 5.6 % Final    Physical Examination:  BP 130/80   Pulse 68   Ht '4\' 11"'$  (1.499 m)   Wt 98 lb (44.5 kg)   SpO2 99%   BMI 19.79 kg/m        ASSESSMENT:  Diabetes type 2, insulin-dependent    A1c is now 8.5, has been as high as 11.8  See history of present illness for discussion of current diabetes management, blood sugar patterns and problems identified  She is on basal bolus insulin, Actos and now with Ozempic 0.25 mg She still has periodically high postprandial readings especially after breakfast  and occasionally lunch Only rarely has mild hypoglycemia but her freestyle libre likely is reading falsely low by at least 10-15 mg  HYPERTENSION: On the blood pressure tends to be high it was better on second measurement  Renal dysfunction: Improved  HYPERLIPIDEMIA: She is on Crestor 40 mg and Zetia with better control now   PLAN:   May benefit from meal planning advice from dietitian since not clear what makes her sugar go up She thinks her insulin regimen is consistent at mealtimes but likely needs 2 more units at breakfast of eating a higher fat meat Conversely if she is eating a smaller meal or less carbohydrates she can reduce her NovoLog by 2 units Also likely needs only 5 minutes on an average for suppertime coverage She does need to make sure she checks her blood sugars before and after supper daily Continue 0.25 mg Ozempic weekly She will check fingerstick blood sugars to compare with her freestyle libre However on her next supply of libre sensor she will get the freestyle libre version 2 which may be more accurate Start walking daily  Start checking blood pressure regularly at home   Patient Instructions  Any time with smaller meals or less starch reduce Novolog by 2 units  Reduce supper dose to 5 Novolog  Check sugar at supper and bedtime daily  Compare sugar with stip meter  Call for [facility] 2     Elayne Snare 06/14/2021, 8:35 AM   Note: This office note was prepared with Dragon voice recognition system technology. Any transcriptional errors that result from this process are unintentional.

## 2021-08-09 ENCOUNTER — Telehealth: Payer: Self-pay | Admitting: Internal Medicine

## 2021-08-09 ENCOUNTER — Other Ambulatory Visit: Payer: Self-pay

## 2021-08-09 ENCOUNTER — Other Ambulatory Visit (INDEPENDENT_AMBULATORY_CARE_PROVIDER_SITE_OTHER): Payer: Medicare Other

## 2021-08-09 DIAGNOSIS — E1165 Type 2 diabetes mellitus with hyperglycemia: Secondary | ICD-10-CM

## 2021-08-09 DIAGNOSIS — Z794 Long term (current) use of insulin: Secondary | ICD-10-CM

## 2021-08-09 LAB — COMPREHENSIVE METABOLIC PANEL
ALT: 16 U/L (ref 0–35)
AST: 23 U/L (ref 0–37)
Albumin: 4.2 g/dL (ref 3.5–5.2)
Alkaline Phosphatase: 46 U/L (ref 39–117)
BUN: 29 mg/dL — ABNORMAL HIGH (ref 6–23)
CO2: 27 mEq/L (ref 19–32)
Calcium: 9.9 mg/dL (ref 8.4–10.5)
Chloride: 106 mEq/L (ref 96–112)
Creatinine, Ser: 1.43 mg/dL — ABNORMAL HIGH (ref 0.40–1.20)
GFR: 36.16 mL/min — ABNORMAL LOW (ref >60.00)
Glucose, Bld: 124 mg/dL — ABNORMAL HIGH (ref 70–99)
Potassium: 4.4 mEq/L (ref 3.5–5.1)
Sodium: 139 mEq/L (ref 135–145)
Total Bilirubin: 0.6 mg/dL (ref 0.2–1.2)
Total Protein: 7.3 g/dL (ref 6.0–8.3)

## 2021-08-09 LAB — MICROALBUMIN / CREATININE URINE RATIO
Creatinine,U: 54.8 mg/dL
Microalb Creat Ratio: 93.6 mg/g — ABNORMAL HIGH (ref 0.0–30.0)
Microalb, Ur: 51.3 mg/dL — ABNORMAL HIGH (ref 0.0–1.9)

## 2021-08-09 LAB — HEMOGLOBIN A1C: Hgb A1c MFr Bld: 9.5 % — ABNORMAL HIGH (ref 4.6–6.5)

## 2021-08-09 NOTE — Telephone Encounter (Signed)
Pt states her needs were met when the appt was changed.

## 2021-08-09 NOTE — Telephone Encounter (Signed)
Patient called to follow up on appointment to have blood pressure taken on Thursday.     Good callback number is [phone]   Please Advise

## 2021-08-10 ENCOUNTER — Ambulatory Visit: Payer: Medicare Other | Admitting: Internal Medicine

## 2021-08-14 ENCOUNTER — Ambulatory Visit
Admission: RE | Admit: 2021-08-14 | Discharge: 2021-08-14 | Disposition: A | Discharge status: Home / Self Care | Payer: Medicare Other | Source: Ambulatory Visit | Attending: Internal Medicine | Admitting: Internal Medicine

## 2021-08-14 ENCOUNTER — Other Ambulatory Visit: Payer: Self-pay

## 2021-08-14 DIAGNOSIS — Z1231 Encounter for screening mammogram for malignant neoplasm of breast: Secondary | ICD-10-CM

## 2021-08-16 ENCOUNTER — Other Ambulatory Visit: Payer: Self-pay

## 2021-08-16 ENCOUNTER — Ambulatory Visit (INDEPENDENT_AMBULATORY_CARE_PROVIDER_SITE_OTHER): Payer: Medicare Other | Admitting: Endocrinology

## 2021-08-16 VITALS — BP 164/80 | HR 82 | Ht 59.0 in | Wt 101.6 lb

## 2021-08-16 DIAGNOSIS — I10 Essential (primary) hypertension: Secondary | ICD-10-CM | POA: Diagnosis not present

## 2021-08-16 DIAGNOSIS — E1165 Type 2 diabetes mellitus with hyperglycemia: Secondary | ICD-10-CM | POA: Diagnosis not present

## 2021-08-16 DIAGNOSIS — Z23 Encounter for immunization: Secondary | ICD-10-CM | POA: Diagnosis not present

## 2021-08-16 DIAGNOSIS — E1121 Type 2 diabetes mellitus with diabetic nephropathy: Secondary | ICD-10-CM

## 2021-08-16 DIAGNOSIS — I2510 Atherosclerotic heart disease of native coronary artery without angina pectoris: Secondary | ICD-10-CM

## 2021-08-16 DIAGNOSIS — Z794 Long term (current) use of insulin: Secondary | ICD-10-CM

## 2021-08-16 MED ORDER — LOSARTAN POTASSIUM 50 MG PO TABS
50.0000 mg | ORAL_TABLET | Freq: Every day | ORAL | 2 refills | Status: DC
Start: 2021-08-16 — End: 2021-11-17

## 2021-08-16 NOTE — Patient Instructions (Addendum)
Lantus 18 units daily  Take 6 units Novolog  for sandwich at lunch  In am if getting oatmeal take 10 Novolog not 8  Take 12 Novolog at supper  Check blood sugars on waking up 7 days a week  Also check blood sugars about 2 hours after meals and do this after different meals by rotation  Recommended blood sugar levels on waking up are 90-130 and about 2 hours after meal is 130-180  Please bring your blood sugar monitor to each visit, thank you

## 2021-08-16 NOTE — Progress Notes (Signed)
Patient ID: Mariah Peterson, female   DOB: 1946/05/15, 75 y.o.   MRN: [id number]          Reason for Appointment: Follow-up    History of Present Illness:          Date of diagnosis of type 2 diabetes mellitus:?  1990       Background history:   She was started on insulin about 4 years ago and previously taking metformin and glipizide No detailed history is available and she does not think she has taken any other types of diabetes medications or injections  Recent history:    INSULIN regimen is: Lantus 16 units daily in am, Novolog 8 units at breakfast, 8 units acs  Non-insulin hypoglycemic drugs the patient is taking are: was on  Ozempic 0.25 mg weekly,  Current management, blood sugar patterns and problems identified:  Her A1c is higher by about 1%, now 9.5   She is off her Ozempic again because of being in the donut hole  With this her blood sugars are significantly higher and averaging 206 at home compared to 156  She did not report higher readings and did not call for substitute for Ozempic  Her weight has increased significantly likely from less inhibition of appetite  Despite repeated instructions she does not check her sugars much except for mostly in the mornings  She is now having the highest reading probably after her supper meal which is usually around 3-4 PM  In the last several days her fasting readings have also gone up significantly  Also may have periodically high readings after breakfast, possibly when she is eating oatmeal compared to eggs and meat and toast She is generally not eating lunch but sometimes will have a meal with carbohydrate like a sandwich Does not appear to have awareness of blood sugar targets after meals which can be as high as 293 after breakfast She has   continued exercise program with walking 25 min about 3 days a week  Breakfast usually 7-8 am and supper 3-4 pm   Glucose sensor: Freestyle libre   Patterns of the blood sugars from  download as follows  Blood sugar data is incomplete and not much information between 12 noon and 10 PM Has blood sugars high through the night and variably after breakfast, blood sugars appear to be high after 9 PM when the data is available She did have relatively low blood sugar only once before lunch   CGM use % of time 46  2-week average/GV 206/27  Time in range 25%  % Time Above 180 57       % Time above 250 18  % Time Below 70 0     PRE-MEAL Fasting Lunch Dinner Bedtime Overall  Glucose range:       Averages: 190   294 206   POST-MEAL PC Breakfast PC Lunch PC Dinner  Glucose range:     Averages: 204  269   Previously:   CGM use % of time 69  2-week average/GV 156  Time in range    68    %  % Time Above 180 25+4  % Time above 250   % Time Below 70 3     PRE-MEAL Fasting Lunch Dinner Bedtime Overall  Glucose range:       Averages: 25  125 173    POST-MEAL PC Breakfast PC Lunch PC Dinner  Glucose range:     Averages: 187 ?  159  136     She was seen by diabetes educator in 9/19       Side effects from medications have been: None  Compliance with the medical regimen: Fair  Typical meal intake: Breakfast is oatmeal, otherwise eggs and toast.  Lunch may be rice and beans or a sandwich at 4 pm Fruit for snacks                Dietician visit, most recent: 09/2018 CDE consultation: 07/01/2018  Weight history:  Wt Readings from Last 3 Encounters:  08/16/21 101 lb 9.6 oz (46.1 kg)  06/13/21 98 lb (44.5 kg)  05/17/21 94 lb 12.8 oz (43 kg)    Glycemic control:   Lab Results  Component Value Date   HGBA1C 9.5 (H) 08/09/2021   HGBA1C 8.5 (A) 06/13/2021   HGBA1C 8.7 (H) 03/03/2021   Lab Results  Component Value Date   MICROALBUR 51.3 (H) 08/09/2021   LDLCALC 56 05/02/2021   CREATININE 1.43 (H) 08/09/2021   Lab Results  Component Value Date   MICRALBCREAT 93.6 (H) 08/09/2021    Lab Results  Component Value Date   FRUCTOSAMINE 434 (H) 05/02/2021    FRUCTOSAMINE 484 (H) 09/23/2020   FRUCTOSAMINE 518 (H) 08/15/2020    No visits with results within 1 Week(s) from this visit.  Latest known visit with results is:  Lab on 08/09/2021  Component Date Value Ref Range Status   Microalb, Ur 08/09/2021 51.3 (A)  0.0 - 1.9 mg/dL Final   Creatinine,U 08/09/2021 54.8  mg/dL Final   Microalb Creat Ratio 08/09/2021 93.6 (A)  0.0 - 30.0 mg/g Final   Sodium 08/09/2021 139  135 - 145 mEq/L Final   Potassium 08/09/2021 4.4  3.5 - 5.1 mEq/L Final   Chloride 08/09/2021 106  96 - 112 mEq/L Final   CO2 08/09/2021 27  19 - 32 mEq/L Final   Glucose, Bld 08/09/2021 124 (A)  70 - 99 mg/dL Final   BUN 08/09/2021 29 (A)  6 - 23 mg/dL Final   Creatinine, Ser 08/09/2021 1.43 (A)  0.40 - 1.20 mg/dL Final   Total Bilirubin 08/09/2021 0.6  0.2 - 1.2 mg/dL Final   Alkaline Phosphatase 08/09/2021 46  39 - 117 U/L Final   AST 08/09/2021 23  0 - 37 U/L Final   ALT 08/09/2021 16  0 - 35 U/L Final   Total Protein 08/09/2021 7.3  6.0 - 8.3 g/dL Final   Albumin 08/09/2021 4.2  3.5 - 5.2 g/dL Final   GFR 08/09/2021 36.16 (A)  >60.00 mL/min Final   Calculated using the CKD-EPI Creatinine Equation (2021)   Calcium 08/09/2021 9.9  8.4 - 10.5 mg/dL Final   Hgb A1c MFr Bld 08/09/2021 9.5 (A)  4.6 - 6.5 % Final   Glycemic Control Guidelines for People with Diabetes:Non Diabetic:  <6%Goal of Therapy: <7%Additional Action Suggested:  >8%     Allergies as of 08/16/2021   No Known Allergies      Medication List        Accurate as of August 16, 2021 10:56 AM. If you have any questions, ask your nurse or doctor.          aspirin EC 81 MG tablet Take 1 tablet (81 mg total) by mouth daily. Swallow whole.   calcium carbonate 1500 (600 Ca) MG Tabs tablet Commonly known as: OSCAL Take 1 tablet by mouth 2 (two) times daily with a meal.   ezetimibe 10 MG tablet Commonly known as: Zetia  Take 1 tablet (10 mg total) by mouth daily.   FreeStyle Libre 14 Day Sensor  Misc Inject 1 each into the skin every 14 (fourteen) days.   glucose blood test strip Use as instructed to test blood sugar 3 times daily E11.65   Lantus SoloStar 100 UNIT/ML Solostar Pen Generic drug: insulin glargine Inject 16 Units into the skin daily. INJECT 16 UNITS UNDER THE SKIN ONCE DAILY. (UPDATED DOSAGE)   metoprolol tartrate 25 MG tablet Commonly known as: LOPRESSOR Take 1 tablet (25 mg total) by mouth 2 (two) times daily.   NovoLOG FlexPen 100 UNIT/ML FlexPen Generic drug: insulin aspart 6 UNITS BEFORE Breakfast on exercise days and if eating eggs 8 Units on other days LUNCH COVERAGE: NOVOLOG 8 UNITS if eating at home and 10 units for fast food Dinner 6 units Novolog before eating   Ozempic (0.25 or 0.5 MG/DOSE) 2 MG/1.5ML Sopn Generic drug: Semaglutide(0.25 or 0.5MG /DOS) Inject 0.5 mg into the skin once a week. 0.25mg  weekly for 4 weeks then 0.5mg  weekly   pioglitazone 15 MG tablet Commonly known as: ACTOS Take 1 tablet by mouth once daily   pregabalin 25 MG capsule Commonly known as: Lyrica Take 1 capsule (25 mg total) by mouth 2 (two) times daily.   rosuvastatin 20 MG tablet Commonly known as: CRESTOR Take 1 tablet (20 mg total) by mouth daily.        Allergies: No Known Allergies  Past Medical History:  Diagnosis Date   Chest pain 11/30/2020   Coronary artery disease    Diabetes mellitus without complication (HCC)    Glaucoma    History of chicken pox    Hypertension     Past Surgical History:  Procedure Laterality Date   BREAST BIOPSY Right 2017   benign   CARDIAC CATHETERIZATION     CATARACT EXTRACTION, BILATERAL Bilateral    CESAREAN SECTION     COLONOSCOPY  1980's   CORONARY ARTERY BYPASS GRAFT N/A 12/05/2020   Procedure: CORONARY ARTERY BYPASS GRAFTING (CABG), ON PUMP, TIMES FOUR, USING LEFT INTERNAL MAMMARY ARTERY AND BILATERAL ENDOSCOPICALLY HARVESTED GREATER SAPHENOUS VEINS;  Surgeon: [name], MD;  Location: [place];   Service: Open Heart Surgery;  Laterality: N/A;   LEFT HEART CATH AND CORONARY ANGIOGRAPHY N/A 11/30/2020   Procedure: LEFT HEART CATH AND CORONARY ANGIOGRAPHY;  Surgeon: Belva Crome, MD;  Location: [place] [facility];  Service: Cardiovascular;  Laterality: N/A;   TEE WITHOUT CARDIOVERSION N/A 12/05/2020   Procedure: TRANSESOPHAGEAL ECHOCARDIOGRAM (TEE);  Surgeon: [name], MD;  Location: [place];  Service: Open Heart Surgery;  Laterality: N/A;   TUBAL LIGATION      Family History  Problem Relation Age of Onset   Hypertension Mother    Diabetes Brother    CAD Brother    Hypertension Brother    Diabetes Brother    Hypertension Brother    Colon cancer Neg Hx    Esophageal cancer Neg Hx    Pancreatic cancer Neg Hx    Liver disease Neg Hx    Stomach cancer Neg Hx    Rectal cancer Neg Hx    Colon polyps Neg Hx     Social History:  reports that she has never smoked. She has never used smokeless tobacco. She reports that she does not drink alcohol and does not use drugs.   Review of Systems   Lipid history: Currently on Crestor 20 mg Previously had not been taking it regularly with LDL as  high as 226 She is also on Zetia  LDL recently below 70  Lab Results  Component Value Date   CHOL 128 05/02/2021   CHOL 163 03/03/2021   CHOL 302 (H) 11/30/2020   Lab Results  Component Value Date   HDL 58.60 05/02/2021   HDL 66.80 03/03/2021   HDL 54 11/30/2020   Lab Results  Component Value Date   LDLCALC 56 05/02/2021   LDLCALC 77 03/03/2021   LDLCALC 226 (H) 11/30/2020   Lab Results  Component Value Date   TRIG 64.0 05/02/2021   TRIG 97.0 03/03/2021   TRIG 108 11/30/2020   Lab Results  Component Value Date   CHOLHDL 2 05/02/2021   CHOLHDL 2 03/03/2021   CHOLHDL 5.6 11/30/2020   No results found for: LDLDIRECT          Hypertension: Has been present for several years, previously on amlodipine and HCTZ  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, blood  pressure was high significantly and higher on the second measurement in the office today  Does not check at home  BP Readings from Last 3 Encounters:  08/16/21 (!) 164/80  06/13/21 130/80  05/17/21 (!) 160/80   Renal function appears to be at continued abnormal levels  Not on nsaids  No history of microalbuminuria  Lab Results  Component Value Date   CREATININE 1.43 (H) 08/09/2021   CREATININE 1.41 (H) 05/02/2021   CREATININE 1.58 (H) 03/03/2021    Most recent eye exam was in 5/19  Most recent foot exam: 10/22  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  LABS:  No visits with results within 1 Week(s) from this visit.  Latest known visit with results is:  Lab on 08/09/2021  Component Date Value Ref Range Status   Microalb, Ur 08/09/2021 51.3 (A)  0.0 - 1.9 mg/dL Final   Creatinine,U 08/09/2021 54.8  mg/dL Final   Microalb Creat Ratio 08/09/2021 93.6 (A)  0.0 - 30.0 mg/g Final   Sodium 08/09/2021 139  135 - 145 mEq/L Final   Potassium 08/09/2021 4.4  3.5 - 5.1 mEq/L Final   Chloride 08/09/2021 106  96 - 112 mEq/L Final   CO2 08/09/2021 27  19 - 32 mEq/L Final   Glucose, Bld 08/09/2021 124 (A)  70 - 99 mg/dL Final   BUN 08/09/2021 29 (A)  6 - 23 mg/dL Final   Creatinine, Ser 08/09/2021 1.43 (A)  0.40 - 1.20 mg/dL Final   Total Bilirubin 08/09/2021 0.6  0.2 - 1.2 mg/dL Final   Alkaline Phosphatase 08/09/2021 46  39 - 117 U/L Final   AST 08/09/2021 23  0 - 37 U/L Final   ALT 08/09/2021 16  0 - 35 U/L Final   Total Protein 08/09/2021 7.3  6.0 - 8.3 g/dL Final   Albumin 08/09/2021 4.2  3.5 - 5.2 g/dL Final   GFR 08/09/2021 36.16 (A)  >60.00 mL/min Final   Calculated using the CKD-EPI Creatinine Equation (2021)   Calcium 08/09/2021 9.9  8.4 - 10.5 mg/dL Final   Hgb A1c MFr Bld 08/09/2021 9.5 (A)  4.6 - 6.5 % Final   Glycemic Control Guidelines for People with Diabetes:Non Diabetic:  <6%Goal of Therapy: <7%Additional Action Suggested:  >8%     Physical  Examination:  BP (!) 164/80   Pulse 82   Ht 4\' 11"  (1.499 m)   Wt 101 lb 9.6 oz (46.1 kg)   SpO2 98%   BMI 20.52 kg/m  Diabetic Foot Exam - Simple   Simple Foot Form Diabetic Foot exam was performed with the following findings: Yes   Visual Inspection No deformities, no ulcerations, no other skin breakdown bilaterally: Yes Sensation Testing Intact to touch and monofilament testing bilaterally: Yes Pulse Check Posterior Tibialis and Dorsalis pulse intact bilaterally: Yes See comments: Yes Comments Mildly decreased pedal pulses    Trace ankle edema    ASSESSMENT:  Diabetes type 2, insulin-dependent    A1c is now 9.5, has been as high as 11.8  See history of present illness for discussion of current diabetes management, blood sugar patterns and problems identified  She is on basal bolus insulin, Actos and now not on Ozempic 0.25 mg  As above her blood sugars are inadequately controlled especially postprandial but difficult to assess because of inadequate monitoring despite using the freestyle libre Blood sugars also fluctuate based on her carbohydrate intake both at breakfast and lunch  HYPERTENSION: Blood pressure is out of control, previously on antihypertensives before her MRI  Renal dysfunction: Stable but still abnormal along with microalbuminuria    PLAN:   Flu vaccine given  Discussed the need to check blood sugars at various times before and after meal Explained to her that she needs to take 2 more units to cover breakfast and eating oatmeal She will also need to take at least 6 units to cover lunch if she is eating a sandwich Appears to be nearing about 12 years NovoLog to cover the evening meal For now we will increase her Lantus to 18 units to improve overnight readings  Application for patient assistance for Ozempic given She needs to discuss nephrology consultation with PCP Not clear if she can afford SGLT2 drugs which may be beneficial for her  CKD  Start losartan 50 mg daily, she will be following up with her PCP next week  Start checking blood pressure regularly at home   There are no Patient Instructions on file for this visit.     Elayne Snare 08/16/2021, 10:56 AM   Note: This office note was prepared with Dragon voice recognition system technology. Any transcriptional errors that result from this process are unintentional.

## 2021-08-23 ENCOUNTER — Ambulatory Visit (INDEPENDENT_AMBULATORY_CARE_PROVIDER_SITE_OTHER): Payer: Medicare Other | Admitting: Internal Medicine

## 2021-08-23 ENCOUNTER — Other Ambulatory Visit: Payer: Self-pay

## 2021-08-23 ENCOUNTER — Encounter: Payer: Self-pay | Admitting: Internal Medicine

## 2021-08-23 VITALS — BP 162/68 | HR 70 | Temp 98.0°F | Ht 59.0 in | Wt 106.4 lb

## 2021-08-23 DIAGNOSIS — I2510 Atherosclerotic heart disease of native coronary artery without angina pectoris: Secondary | ICD-10-CM | POA: Diagnosis not present

## 2021-08-23 DIAGNOSIS — Z794 Long term (current) use of insulin: Secondary | ICD-10-CM

## 2021-08-23 DIAGNOSIS — N1832 Chronic kidney disease, stage 3b: Secondary | ICD-10-CM

## 2021-08-23 DIAGNOSIS — E782 Mixed hyperlipidemia: Secondary | ICD-10-CM | POA: Diagnosis not present

## 2021-08-23 DIAGNOSIS — E1122 Type 2 diabetes mellitus with diabetic chronic kidney disease: Secondary | ICD-10-CM | POA: Diagnosis not present

## 2021-08-23 DIAGNOSIS — I214 Non-ST elevation (NSTEMI) myocardial infarction: Secondary | ICD-10-CM

## 2021-08-23 DIAGNOSIS — I10 Essential (primary) hypertension: Secondary | ICD-10-CM | POA: Diagnosis not present

## 2021-08-23 LAB — BASIC METABOLIC PANEL
BUN: 26 mg/dL — ABNORMAL HIGH (ref 6–23)
CO2: 26 mEq/L (ref 19–32)
Calcium: 9.4 mg/dL (ref 8.4–10.5)
Chloride: 106 mEq/L (ref 96–112)
Creatinine, Ser: 1.38 mg/dL — ABNORMAL HIGH (ref 0.40–1.20)
GFR: 37.73 mL/min — ABNORMAL LOW (ref >60.00)
Glucose, Bld: 149 mg/dL — ABNORMAL HIGH (ref 70–99)
Potassium: 4.3 mEq/L (ref 3.5–5.1)
Sodium: 138 mEq/L (ref 135–145)

## 2021-08-23 NOTE — Progress Notes (Signed)
Established Patient Office Visit     This visit occurred during the SARS-CoV-2 public health emergency.  Safety protocols were in place, including screening questions prior to the visit, additional usage of staff PPE, and extensive cleaning of exam room while observing appropriate contact time as indicated for disinfecting solutions.    CC/Reason for Visit: Follow-up chronic conditions  HPI: Mariah Peterson is a 75 y.o. female who is coming in today for the above mentioned reasons. Past Medical History is significant for:  Hypertension, hyperlipidemia, insulin-dependent diabetes followed by Dr. Dwyane Dee.  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  She is followed by thoracic surgeon Dr. Kipp Brood, her cardiologist is Dr. Marlou Porch.  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  At her recent visit with her endocrinologist her A1c had increased to 9.5 and insulin doses were adjusted.  She is unable to use semaglutide due to financial issues.  Losartan 50 mg was added at her last visit with endocrinology.  She is feeling well and has no acute concerns, she has already had her flu vaccine, however she is due for COVID booster and shingles.   Past Medical/Surgical History: Past Medical History:  Diagnosis Date   Chest pain 11/30/2020   Coronary artery disease    Diabetes mellitus without complication (HCC)    Glaucoma    History of chicken pox    Hypertension     Past Surgical History:  Procedure Laterality Date   BREAST BIOPSY Right 2017   benign   CARDIAC CATHETERIZATION     CATARACT EXTRACTION, BILATERAL Bilateral    CESAREAN SECTION     COLONOSCOPY  1980's   CORONARY ARTERY BYPASS GRAFT N/A 12/05/2020   Procedure: CORONARY ARTERY BYPASS GRAFTING (CABG), ON PUMP, TIMES FOUR, USING LEFT INTERNAL MAMMARY ARTERY AND BILATERAL ENDOSCOPICALLY HARVESTED GREATER SAPHENOUS VEINS;   Surgeon: [name], MD;  Location: [place];  Service: Open Heart Surgery;  Laterality: N/A;   LEFT HEART CATH AND CORONARY ANGIOGRAPHY N/A 11/30/2020   Procedure: LEFT HEART CATH AND CORONARY ANGIOGRAPHY;  Surgeon: Belva Crome, MD;  Location: [place] [facility];  Service: Cardiovascular;  Laterality: N/A;   TEE WITHOUT CARDIOVERSION N/A 12/05/2020   Procedure: TRANSESOPHAGEAL ECHOCARDIOGRAM (TEE);  Surgeon: [name], MD;  Location: [place];  Service: Open Heart Surgery;  Laterality: N/A;   TUBAL LIGATION      Social History:  reports that she has never smoked. She has never used smokeless tobacco. She reports that she does not drink alcohol and does not use drugs.  Allergies: No Known Allergies  Family History:  Family History  Problem Relation Age of Onset   Hypertension Mother    Diabetes Brother    CAD Brother    Hypertension Brother    Diabetes Brother    Hypertension Brother    Colon cancer Neg Hx    Esophageal cancer Neg Hx    Pancreatic cancer Neg Hx    Liver disease Neg Hx    Stomach cancer Neg Hx    Rectal cancer Neg Hx    Colon polyps Neg Hx      Current Outpatient Medications:    aspirin EC 81 MG tablet, Take 1 tablet (81 mg total) by mouth daily. Swallow whole., Disp: 90 tablet, Rfl: 3   calcium carbonate (OSCAL) 1500 (600 Ca) MG TABS tablet, Take  1 tablet by mouth 2 (two) times daily with a meal., Disp: , Rfl:    Continuous Blood Gluc Sensor (FREESTYLE LIBRE 14 DAY SENSOR) MISC, Inject 1 each into the skin every 14 (fourteen) days., Disp: 2 each, Rfl: 5   ezetimibe (ZETIA) 10 MG tablet, Take 1 tablet (10 mg total) by mouth daily., Disp: 90 tablet, Rfl: 3   glucose blood test strip, Use as instructed to test blood sugar 3 times daily E11.65, Disp: 270 each, Rfl: 1   insulin aspart (NOVOLOG FLEXPEN) 100 UNIT/ML FlexPen, 6 UNITS BEFORE Breakfast on exercise days and if eating eggs 8 Units on other days LUNCH COVERAGE: NOVOLOG 8 UNITS if eating at  home and 10 units for fast food Dinner 6 units Novolog before eating, Disp: 15 mL, Rfl: 3   insulin glargine (LANTUS SOLOSTAR) 100 UNIT/ML Solostar Pen, Inject 16 Units into the skin daily. INJECT 16 UNITS UNDER THE SKIN ONCE DAILY. (UPDATED DOSAGE), Disp: 15 mL, Rfl: 3   losartan (COZAAR) 50 MG tablet, Take 1 tablet (50 mg total) by mouth daily., Disp: 30 tablet, Rfl: 2   metoprolol tartrate (LOPRESSOR) 25 MG tablet, Take 1 tablet (25 mg total) by mouth 2 (two) times daily., Disp: 90 tablet, Rfl: 1   pioglitazone (ACTOS) 15 MG tablet, Take 1 tablet by mouth once daily, Disp: 30 tablet, Rfl: 2   pregabalin (LYRICA) 25 MG capsule, Take 1 capsule (25 mg total) by mouth 2 (two) times daily., Disp: 60 capsule, Rfl: 0   rosuvastatin (CRESTOR) 20 MG tablet, Take 1 tablet (20 mg total) by mouth daily., Disp: 30 tablet, Rfl: 5   Semaglutide,0.25 or 0.5MG /DOS, (OZEMPIC, 0.25 OR 0.5 MG/DOSE,) 2 MG/1.5ML SOPN, Inject 0.5 mg into the skin once a week. 0.25mg  weekly for 4 weeks then 0.5mg  weekly, Disp: 1.5 mL, Rfl: 2  Current Facility-Administered Medications:    diclofenac Sodium (VOLTAREN) 1 % topical gel 2 g, 2 g, Topical, TID, Lars Pinks M, PA-C  Review of Systems:  Constitutional: Denies fever, chills, diaphoresis, appetite change and fatigue.  HEENT: Denies photophobia, eye pain, redness, hearing loss, ear pain, congestion, sore throat, rhinorrhea, sneezing, mouth sores, trouble swallowing, neck pain, neck stiffness and tinnitus.   Respiratory: Denies SOB, DOE, cough, chest tightness,  and wheezing.   Cardiovascular: Denies chest pain, palpitations and leg swelling.  Gastrointestinal: Denies nausea, vomiting, abdominal pain, diarrhea, constipation, blood in stool and abdominal distention.  Genitourinary: Denies dysuria, urgency, frequency, hematuria, flank pain and difficulty urinating.  Endocrine: Denies: hot or cold intolerance, sweats, changes in hair or nails, polyuria,  polydipsia. Musculoskeletal: Denies myalgias, back pain, joint swelling, arthralgias and gait problem.  Skin: Denies pallor, rash and wound.  Neurological: Denies dizziness, seizures, syncope, weakness, light-headedness, numbness and headaches.  Hematological: Denies adenopathy. Easy bruising, personal or family bleeding history  Psychiatric/Behavioral: Denies suicidal ideation, mood changes, confusion, nervousness, sleep disturbance and agitation    Physical Exam: Vitals:   08/23/21 1111  BP: (!) 162/68  Pulse: 70  Temp: 98 F (36.7 C)  TempSrc: Oral  SpO2: 99%  Weight: 106 lb 6.4 oz (48.3 kg)  Height: 4\' 11"  (1.499 m)    Body mass index is 21.49 kg/m.   Constitutional: NAD, calm, comfortable Eyes: PERRL, lids and conjunctivae normal ENMT: Mucous membranes are moist.  Respiratory: clear to auscultation bilaterally, no wheezing, no crackles. Normal respiratory effort. No accessory muscle use.  Cardiovascular: Regular rate and rhythm, no murmurs / rubs / gallops. No extremity edema.  Neurologic: Grossly  intact and nonfocal Psychiatric: Normal judgment and insight. Alert and oriented x 3. Normal mood.    Impression and Plan:  Coronary artery disease -With non-ST elevated MI in February culminating in CABG x4, she is followed by cardiology and thoracic surgery.  Primary hypertension  - Plan: Basic metabolic panel -Not well controlled with an in office blood pressure of 162/68, losartan was added just 1 week ago, I will have her come back in 8 weeks for follow-up.  She knows to do ambulatory blood pressure monitoring.  Mixed hyperlipidemia -Last lipid panel was at goal with a total cholesterol of 128, triglycerides 64 and LDL 56.  She is currently on ezetimibe 10 mg daily and rosuvastatin 20 mg daily.  Type 2 diabetes mellitus with stage 3b chronic kidney disease, with long-term current use of insulin ([place])  - Plan: Ambulatory referral to Nephrology -Check renal function  today given stage IIIb chronic kidney disease and newly added losartan.  Time spent: 33 minutes reviewing chart, interviewing and examining patient and formulating plan of care.   Patient Instructions  -Nice seeing you today!!  -Lab work today; will notify you once results are available.  -Remember your COVID booster and shingles vaccines at the pharmacy,  -Referral to see a kidney doctor has been placed today.  -Schedule follow up in 2 months for your BP.    Lelon Frohlich, MD [facility]

## 2021-08-23 NOTE — Patient Instructions (Signed)
-  Nice seeing you today!!  -Lab work today; will notify you once results are available.  -Remember your COVID booster and shingles vaccines at the pharmacy,  -Referral to see a kidney doctor has been placed today.  -Schedule follow up in 2 months for your BP.

## 2021-08-23 NOTE — Addendum Note (Signed)
Addended by: Amanda Cockayne on: 08/23/2021 11:49 AM   Modules accepted: Orders

## 2021-09-25 ENCOUNTER — Telehealth: Payer: Self-pay | Admitting: Internal Medicine

## 2021-09-25 ENCOUNTER — Other Ambulatory Visit (INDEPENDENT_AMBULATORY_CARE_PROVIDER_SITE_OTHER): Payer: Medicare Other

## 2021-09-25 ENCOUNTER — Other Ambulatory Visit: Payer: Self-pay

## 2021-09-25 DIAGNOSIS — E1165 Type 2 diabetes mellitus with hyperglycemia: Secondary | ICD-10-CM

## 2021-09-25 DIAGNOSIS — Z794 Long term (current) use of insulin: Secondary | ICD-10-CM

## 2021-09-25 NOTE — Telephone Encounter (Signed)
Ronalee Belts from [facility] called because they received the order Dr.Hernandez sent in but they need the most recent office visit notes.    Please send to fax [phone]    Good callback number is [phone]   Please advise

## 2021-09-26 LAB — FRUCTOSAMINE: Fructosamine: 435 umol/L — ABNORMAL HIGH (ref 0–285)

## 2021-09-26 NOTE — Telephone Encounter (Signed)
Office note faxed and confirmed

## 2021-09-28 ENCOUNTER — Encounter: Payer: Self-pay | Admitting: Endocrinology

## 2021-09-28 ENCOUNTER — Ambulatory Visit (INDEPENDENT_AMBULATORY_CARE_PROVIDER_SITE_OTHER): Payer: Medicare Other | Admitting: Endocrinology

## 2021-09-28 ENCOUNTER — Other Ambulatory Visit: Payer: Self-pay

## 2021-09-28 VITALS — BP 162/80 | HR 85 | Ht 59.0 in | Wt 107.6 lb

## 2021-09-28 DIAGNOSIS — E1121 Type 2 diabetes mellitus with diabetic nephropathy: Secondary | ICD-10-CM

## 2021-09-28 DIAGNOSIS — I2510 Atherosclerotic heart disease of native coronary artery without angina pectoris: Secondary | ICD-10-CM

## 2021-09-28 DIAGNOSIS — I10 Essential (primary) hypertension: Secondary | ICD-10-CM

## 2021-09-28 DIAGNOSIS — Z794 Long term (current) use of insulin: Secondary | ICD-10-CM

## 2021-09-28 DIAGNOSIS — E1165 Type 2 diabetes mellitus with hyperglycemia: Secondary | ICD-10-CM

## 2021-09-28 MED ORDER — HYDROCHLOROTHIAZIDE 12.5 MG PO CAPS: 12.5000 mg | ORAL_CAPSULE | Freq: Every day | ORAL | 1 refills | Status: DC

## 2021-09-28 MED ORDER — AMLODIPINE BESYLATE 5 MG PO TABS
5.0000 mg | ORAL_TABLET | Freq: Every day | ORAL | 3 refills | Status: DC
Start: 2021-09-28 — End: 2021-12-28

## 2021-09-28 NOTE — Progress Notes (Signed)
Patient ID: Mariah Peterson, female   DOB: 08/04/1946, 75 y.o.   MRN: [id number]          Reason for Appointment: Follow-up    History of Present Illness:          Date of diagnosis of type 2 diabetes mellitus:?  1990       Background history:   She was started on insulin about 4 years ago and previously taking metformin and glipizide No detailed history is available and she does not think she has taken any other types of diabetes medications or injections  Recent history:    INSULIN regimen is: Lantus 16 units daily in am, Novolog 8 units at breakfast, 6 units acs  Non-insulin hypoglycemic drugs the patient is taking are: was on  Ozempic 0.25 mg weekly,  Current management, blood sugar patterns and problems identified:  Her A1c is last 9.5 Fructosamine is about the same at 235  She is still not getting her Ozempic again because of being in the donut hole; she was supposed to start this with the patient assistance program but she has not brought the form back Although blood sugars are somewhat better than the last time she still has marked postprandial hyperglycemia at times especially in the evenings With this her blood sugars are significantly higher and averaging 206 at home compared to 156  She has been reminded on each visit to check blood sugars after lunch and dinner but she is mostly checking them in the morning and midday She is supposed to be taking 8 units before lunch but is only taking 6 units, also has not increase her suppertime dose She was supposed to take 18 units of Lantus but is still taking 16 although some of her fasting readings are fairly good Likely have an episode of hypoglycemia from taking her lunchtime insulin and not eating Her weight has leveled off  Breakfast usually 7-8 am and supper 3-4 pm   Glucose sensor: Freestyle libre   Interpretation of the blood glucose data from download as follows  Blood sugar data is incomplete between about 2  PM-10 PM and only on some days information is available  Especially difficult to assess blood sugars after lunch and before dinner  OVERNIGHT blood sugars appear to start significantly high with average 260 at midnight and variably lower readings overnight until 6 AM without hypoglycemia; LOWEST average blood sugar overnight is 152 although fasting readings have been as low as 102 POSTPRANDIAL readings are variably high after breakfast but generally well controlled compared to Premeal readings Postprandial readings after lunch are not assessed but may be higher a couple of times Blood sugars after dinner analysis but blood sugars between 11 PM-12 AM have been very consistently high Only had low blood sugar once around 1 PM, not clear if this was before or after lunch  CGM use % of time 58  2-week average/GV 191/36  Time in range       46% was 25  % Time Above 180 36  % Time above 250 17  % Time Below 70 1%     PRE-MEAL Fasting Lunch Dinner Bedtime Overall  Glucose range:       Averages: 161 208  274    POST-MEAL PC Breakfast PC Lunch PC Dinner  Glucose range:     Averages: 207 224    Previously:   CGM use % of time 46  2-week average/GV 206/27  Time in range 25%  %  Time Above 180 57       % Time above 250 18  % Time Below 70 0     PRE-MEAL Fasting Lunch Dinner Bedtime Overall  Glucose range:       Averages: 190   294 206   POST-MEAL PC Breakfast PC Lunch PC Dinner  Glucose range:     Averages: 204  269      She was seen by diabetes educator in 9/19       Side effects from medications have been: None  Compliance with the medical regimen: Fair  Typical meal intake: Breakfast is oatmeal, otherwise eggs and toast.  Lunch may be rice and beans or a sandwich at 4 pm Fruit for snacks                Dietician visit, most recent: 09/2018 CDE consultation: 07/01/2018  Weight history:  Wt Readings from Last 3 Encounters:  09/28/21 107 lb 9.6 oz (48.8 kg)  08/23/21  106 lb 6.4 oz (48.3 kg)  08/16/21 101 lb 9.6 oz (46.1 kg)    Glycemic control:   Lab Results  Component Value Date   HGBA1C 9.5 (H) 08/09/2021   HGBA1C 8.5 (A) 06/13/2021   HGBA1C 8.7 (H) 03/03/2021   Lab Results  Component Value Date   MICROALBUR 51.3 (H) 08/09/2021   LDLCALC 56 05/02/2021   CREATININE 1.38 (H) 08/23/2021   Lab Results  Component Value Date   MICRALBCREAT 93.6 (H) 08/09/2021    Lab Results  Component Value Date   FRUCTOSAMINE 435 (H) 09/25/2021   FRUCTOSAMINE 434 (H) 05/02/2021   FRUCTOSAMINE 484 (H) 09/23/2020    Lab on 09/25/2021  Component Date Value Ref Range Status   Fructosamine 09/25/2021 435 (H)  0 - 285 umol/L Final   Comment: Published reference interval for apparently healthy subjects between age 24 and 64 is 24 - 285 umol/L and in a poorly controlled diabetic population is 228 - 563 umol/L with a mean of 396 umol/L.     Allergies as of 09/28/2021   No Known Allergies      Medication List        Accurate as of September 28, 2021 10:35 AM. If you have any questions, ask your nurse or doctor.          aspirin EC 81 MG tablet Take 1 tablet (81 mg total) by mouth daily. Swallow whole.   calcium carbonate 1500 (600 Ca) MG Tabs tablet Commonly known as: OSCAL Take 1 tablet by mouth 2 (two) times daily with a meal.   ezetimibe 10 MG tablet Commonly known as: Zetia Take 1 tablet (10 mg total) by mouth daily.   FreeStyle Libre 14 Day Sensor Misc Inject 1 each into the skin every 14 (fourteen) days.   glucose blood test strip Use as instructed to test blood sugar 3 times daily E11.65   Lantus SoloStar 100 UNIT/ML Solostar Pen Generic drug: insulin glargine Inject 16 Units into the skin daily. INJECT 16 UNITS UNDER THE SKIN ONCE DAILY. (UPDATED DOSAGE)   losartan 50 MG tablet Commonly known as: COZAAR Take 1 tablet (50 mg total) by mouth daily.   metoprolol tartrate 25 MG tablet Commonly known as: LOPRESSOR Take 1  tablet (25 mg total) by mouth 2 (two) times daily.   NovoLOG FlexPen 100 UNIT/ML FlexPen Generic drug: insulin aspart 6 UNITS BEFORE Breakfast on exercise days and if eating eggs 8 Units on other days LUNCH COVERAGE: NOVOLOG 8 UNITS if eating  at home and 10 units for fast food Dinner 6 units Novolog before eating   Ozempic (0.25 or 0.5 MG/DOSE) 2 MG/1.5ML Sopn Generic drug: Semaglutide(0.25 or 0.5MG /DOS) Inject 0.5 mg into the skin once a week. 0.25mg  weekly for 4 weeks then 0.5mg  weekly   pioglitazone 15 MG tablet Commonly known as: ACTOS Take 1 tablet by mouth once daily   pregabalin 25 MG capsule Commonly known as: Lyrica Take 1 capsule (25 mg total) by mouth 2 (two) times daily.   rosuvastatin 20 MG tablet Commonly known as: CRESTOR Take 1 tablet (20 mg total) by mouth daily.        Allergies: No Known Allergies  Past Medical History:  Diagnosis Date   Chest pain 11/30/2020   Coronary artery disease    Diabetes mellitus without complication (HCC)    Glaucoma    History of chicken pox    Hypertension     Past Surgical History:  Procedure Laterality Date   BREAST BIOPSY Right 2017   benign   CARDIAC CATHETERIZATION     CATARACT EXTRACTION, BILATERAL Bilateral    CESAREAN SECTION     COLONOSCOPY  1980's   CORONARY ARTERY BYPASS GRAFT N/A 12/05/2020   Procedure: CORONARY ARTERY BYPASS GRAFTING (CABG), ON PUMP, TIMES FOUR, USING LEFT INTERNAL MAMMARY ARTERY AND BILATERAL ENDOSCOPICALLY HARVESTED GREATER SAPHENOUS VEINS;  Surgeon: [name], MD;  Location: [facility];  Service: Open Heart Surgery;  Laterality: N/A;   LEFT HEART CATH AND CORONARY ANGIOGRAPHY N/A 11/30/2020   Procedure: LEFT HEART CATH AND CORONARY ANGIOGRAPHY;  Surgeon: Belva Crome, MD;  Location: [facility];  Service: Cardiovascular;  Laterality: N/A;   TEE WITHOUT CARDIOVERSION N/A 12/05/2020   Procedure: TRANSESOPHAGEAL ECHOCARDIOGRAM (TEE);  Surgeon: [name], MD;   Location: [facility];  Service: Open Heart Surgery;  Laterality: N/A;   TUBAL LIGATION      Family History  Problem Relation Age of Onset   Hypertension Mother    Diabetes Brother    CAD Brother    Hypertension Brother    Diabetes Brother    Hypertension Brother    Colon cancer Neg Hx    Esophageal cancer Neg Hx    Pancreatic cancer Neg Hx    Liver disease Neg Hx    Stomach cancer Neg Hx    Rectal cancer Neg Hx    Colon polyps Neg Hx     Social History:  reports that she has never smoked. She has never used smokeless tobacco. She reports that she does not drink alcohol and does not use drugs.   Review of Systems   Lipid history: Currently on Crestor 20 mg Previously had not been taking it regularly with LDL as high as 226 She is also on Zetia  LDL recently below 70  Lab Results  Component Value Date   CHOL 128 05/02/2021   CHOL 163 03/03/2021   CHOL 302 (H) 11/30/2020   Lab Results  Component Value Date   HDL 58.60 05/02/2021   HDL 66.80 03/03/2021   HDL 54 11/30/2020   Lab Results  Component Value Date   LDLCALC 56 05/02/2021   LDLCALC 77 03/03/2021   LDLCALC 226 (H) 11/30/2020   Lab Results  Component Value Date   TRIG 64.0 05/02/2021   TRIG 97.0 03/03/2021   TRIG 108 11/30/2020   Lab Results  Component Value Date   CHOLHDL 2 05/02/2021   CHOLHDL 2 03/03/2021   CHOLHDL 5.6 11/30/2020  No results found for: LDLDIRECT          Hypertension: Has been present for several years, previously on amlodipine and HCTZ  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 On her last visit LOSARTAN was added but blood pressure appears to be still significantly high Second measurement by myself was indicating systolic of 992  Still has not checked blood pressure at home   BP Readings from Last 3 Encounters:  09/28/21 (!) 162/80  08/23/21 (!) 162/68  08/16/21 (!) 164/80   Renal function appears to be at mildly abnormal again May be getting referral to  nephrologist by PCP  Not on nsaids  No history of microalbuminuria  Lab Results  Component Value Date   CREATININE 1.38 (H) 08/23/2021   CREATININE 1.43 (H) 08/09/2021   CREATININE 1.41 (H) 05/02/2021    Most recent eye exam was in 5/19  Most recent foot exam: 10/22  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  LABS:  Lab on 09/25/2021  Component Date Value Ref Range Status   Fructosamine 09/25/2021 435 (H)  0 - 285 umol/L Final   Comment: Published reference interval for apparently healthy subjects between age 66 and 81 is 27 - 285 umol/L and in a poorly controlled diabetic population is 228 - 563 umol/L with a mean of 396 umol/L.     Physical Examination:  BP (!) 162/80 (BP Location: Left Arm, Patient Position: Sitting, Cuff Size: Normal)   Pulse 85   Ht 4\' 11"  (1.499 m)   Wt 107 lb 9.6 oz (48.8 kg)   SpO2 96%   BMI 21.73 kg/m        ASSESSMENT:  Diabetes type 2, insulin-dependent    A1c is last 9.5, has been as high as 11.8 Fructosamine is 435 and significantly higher again  See history of present illness for discussion of current diabetes management, blood sugar patterns and problems identified  She is on basal bolus insulin, Actos   As above her blood sugars are not controlled after meals and variably high overnight also Still not monitoring blood sugars adequately with her freestyle libre especially afternoon and evening Appears to be needing much more insulin to cover her evening meal as bedtime readings are averaging 274  Also has some fluctuation in her blood sugars in the mornings and after breakfast  HYPERTENSION: Blood pressure is significantly high as discussed above, not improved with adding losartan  Renal dysfunction: Stable but still abnormal  This is associated with microalbuminuria    PLAN:   Reminded her about the need to check blood sugars at various times before and after at least dinnertime and some after  lunch She thinks she can have her grandchild reminded her to check the blood sugars she does not have a smart phone Explained to her that she has higher readings after supper and likely needs to increase her dose to 10 units instead of 6 units even though she is eating a smaller meal She will also likely need 8 units to cover her lunch from eating a full meal To call if she starts getting any hypoglycemia or persistently high readings also  Application for patient assistance for Ozempic to be filled out and submitted, she will bring this in  Continue losartan 50 mg daily, she will however need to go back on amlodipine and HCTZ as before because of marked increase in blood pressure May also consider adding Farxiga   There are no Patient Instructions on file  for this visit.     Elayne Snare 09/28/2021, 10:35 AM   Note: This office note was prepared with Dragon voice recognition system technology. Any transcriptional errors that result from this process are unintentional.

## 2021-09-28 NOTE — Patient Instructions (Addendum)
Lantus 18 units daily   Take 6 units Novolog  for sandwich at lunch   In am if getting oatmeal take 10 Novolog not 8   Take 12 Novolog at supper   Check blood sugars on waking up 7 days a week   Also check blood sugars about 2 hours after meals and do this after different meals by rotation   Recommended blood sugar levels on waking up are 90-130 and about 2 hours after meal is 130-180   Please bring your blood sugar monitor to each visit, thank you

## 2021-10-24 ENCOUNTER — Ambulatory Visit: Payer: Medicare Other | Admitting: Internal Medicine

## 2021-10-30 ENCOUNTER — Ambulatory Visit: Payer: Medicare Other | Admitting: Internal Medicine

## 2021-11-14 ENCOUNTER — Other Ambulatory Visit: Payer: Self-pay | Admitting: Nephrology

## 2021-11-14 DIAGNOSIS — N1832 Chronic kidney disease, stage 3b: Secondary | ICD-10-CM

## 2021-11-15 ENCOUNTER — Ambulatory Visit
Admission: RE | Admit: 2021-11-15 | Discharge: 2021-11-15 | Disposition: A | Discharge status: Home / Self Care | Payer: Medicare Other | Source: Ambulatory Visit | Attending: Nephrology | Admitting: Nephrology

## 2021-11-15 DIAGNOSIS — N1832 Chronic kidney disease, stage 3b: Secondary | ICD-10-CM

## 2021-11-17 ENCOUNTER — Telehealth: Payer: Self-pay | Admitting: Endocrinology

## 2021-11-17 ENCOUNTER — Other Ambulatory Visit: Payer: Self-pay | Admitting: Endocrinology

## 2021-11-17 NOTE — Telephone Encounter (Signed)
Mariah Peterson is calling in stating that he would like to see if we have received prior authorization for [facility].  He would like to see if it can be faxed back to [phone].

## 2021-11-20 ENCOUNTER — Other Ambulatory Visit (HOSPITAL_COMMUNITY): Payer: Self-pay

## 2021-11-21 ENCOUNTER — Other Ambulatory Visit: Payer: Self-pay

## 2021-11-21 DIAGNOSIS — Z794 Long term (current) use of insulin: Secondary | ICD-10-CM

## 2021-11-21 DIAGNOSIS — E1165 Type 2 diabetes mellitus with hyperglycemia: Secondary | ICD-10-CM

## 2021-11-21 MED ORDER — FREESTYLE LIBRE 2 SENSOR MISC: 3 refills | Status: DC

## 2021-11-23 ENCOUNTER — Other Ambulatory Visit: Payer: Self-pay

## 2021-11-28 ENCOUNTER — Other Ambulatory Visit (INDEPENDENT_AMBULATORY_CARE_PROVIDER_SITE_OTHER): Payer: Medicare Other

## 2021-11-28 ENCOUNTER — Other Ambulatory Visit: Payer: Self-pay

## 2021-11-28 DIAGNOSIS — E1121 Type 2 diabetes mellitus with diabetic nephropathy: Secondary | ICD-10-CM | POA: Diagnosis not present

## 2021-11-28 DIAGNOSIS — E1165 Type 2 diabetes mellitus with hyperglycemia: Secondary | ICD-10-CM | POA: Diagnosis not present

## 2021-11-28 DIAGNOSIS — Z794 Long term (current) use of insulin: Secondary | ICD-10-CM | POA: Diagnosis not present

## 2021-11-28 LAB — BASIC METABOLIC PANEL
BUN: 29 mg/dL — ABNORMAL HIGH (ref 6–23)
CO2: 26 mEq/L (ref 19–32)
Calcium: 9.8 mg/dL (ref 8.4–10.5)
Chloride: 105 mEq/L (ref 96–112)
Creatinine, Ser: 1.74 mg/dL — ABNORMAL HIGH (ref 0.40–1.20)
GFR: 28.51 mL/min — ABNORMAL LOW (ref >60.00)
Glucose, Bld: 191 mg/dL — ABNORMAL HIGH (ref 70–99)
Potassium: 5.2 mEq/L — ABNORMAL HIGH (ref 3.5–5.1)
Sodium: 136 mEq/L (ref 135–145)

## 2021-11-28 LAB — HEMOGLOBIN A1C: Hgb A1c MFr Bld: 9.9 % — ABNORMAL HIGH (ref 4.6–6.5)

## 2021-11-28 LAB — MICROALBUMIN / CREATININE URINE RATIO
Creatinine,U: 31.2 mg/dL
Microalb Creat Ratio: 51.5 mg/g — ABNORMAL HIGH (ref 0.0–30.0)
Microalb, Ur: 16.1 mg/dL — ABNORMAL HIGH (ref 0.0–1.9)

## 2021-12-04 ENCOUNTER — Other Ambulatory Visit: Payer: Medicare Other

## 2021-12-07 ENCOUNTER — Encounter: Payer: Self-pay | Admitting: Endocrinology

## 2021-12-07 ENCOUNTER — Ambulatory Visit (INDEPENDENT_AMBULATORY_CARE_PROVIDER_SITE_OTHER): Payer: Medicare Other | Admitting: Endocrinology

## 2021-12-07 ENCOUNTER — Other Ambulatory Visit: Payer: Self-pay

## 2021-12-07 VITALS — BP 150/72 | HR 79 | Ht <= 58 in | Wt 111.4 lb

## 2021-12-07 DIAGNOSIS — I10 Essential (primary) hypertension: Secondary | ICD-10-CM

## 2021-12-07 DIAGNOSIS — Z794 Long term (current) use of insulin: Secondary | ICD-10-CM

## 2021-12-07 DIAGNOSIS — E7800 Pure hypercholesterolemia, unspecified: Secondary | ICD-10-CM

## 2021-12-07 DIAGNOSIS — N183 Chronic kidney disease, stage 3 unspecified: Secondary | ICD-10-CM

## 2021-12-07 DIAGNOSIS — E1165 Type 2 diabetes mellitus with hyperglycemia: Secondary | ICD-10-CM | POA: Diagnosis not present

## 2021-12-07 MED ORDER — EMPAGLIFLOZIN 25 MG PO TABS: 25.0000 mg | ORAL_TABLET | Freq: Every day | ORAL | 3 refills | Status: DC

## 2021-12-07 NOTE — Addendum Note (Signed)
Addended by: Elayne Snare on: 12/07/2021 11:09 AM   Modules accepted: Orders

## 2021-12-07 NOTE — Progress Notes (Signed)
Patient ID: Mariah Peterson, female   DOB: 08-20-1946, 76 y.o.   MRN: [id number]          Reason for Appointment: Follow-up    History of Present Illness:          Date of diagnosis of type 2 diabetes mellitus:?  1990       Background history:   She was started on insulin about 4 years ago and previously taking metformin and glipizide No detailed history is available and she does not think she has taken any other types of diabetes medications or injections  Recent history:    INSULIN regimen is: Lantus 14 units daily in am, Novolog 8 units at breakfast, 6 units at lunch and supper  Non-insulin hypoglycemic drugs the patient is taking are: None, was on  Ozempic 0.25 mg weekly,  Current management, blood sugar patterns and problems identified:  Her A1c is still significantly high at 9.9 compared to 9.5 Fructosamine last 235  She was told to increase her Lantus as well as her suppertime NovoLog but she has not done so  She was also advised to have reminders to check her sugars consistently after supper and sometimes in the late afternoon also but she is mostly checking in the morning and lunchtime only  Also appears to be getting progressively higher readings and generally continuing to be high dose of the day  Again difficult to assess how when her sugars are controlled after dinner with inadequate monitoring  She is only taking 14 units of Lantus instead of 16 that was not recommended last time  She now thinks that she is eating 3 meals a day including a sandwich at lunchtime She does think that she is taking her NovoLog before starting to eat although cannot explain why her sugars dropped low at noon about a week ago Her nephrologist recommended [facility] but apparently she did not get a prescription  Since she cannot get Ozempic up because of the cost  Again she has been delaying submitting the patient assistance program and only brought it back a couple of weeks ago  Weight has  gone up  Breakfast usually 8-9 am, lunch 1 PM, supper 6 PM    Glucose sensor: Freestyle libre   Interpretation of the blood glucose data from download as follows  HYPERGLYCEMIA occurs after breakfast starting relatively early in the morning blood sugars are mostly persistently high the rest of the day but the data is incomplete Overnight blood sugars are variable with fasting glucose as low as 84 Hypoglycemia is minimal   CGM use % of time 59  2-week average/GV 167  Time in range     64, was 46   %  % Time Above 180 28+7  % Time above 250   % Time Below 70 1     PRE-MEAL Fasting Lunch Dinner Bedtime Overall  Glucose range:       Averages: 139   196    POST-MEAL PC Breakfast PC Lunch PC Dinner  Glucose range:     Averages: 207 204 218   Previously:  CGM use % of time 58  2-week average/GV 191/36  Time in range       46% was 25  % Time Above 180 36  % Time above 250 17  % Time Below 70 1%     PRE-MEAL Fasting Lunch Dinner Bedtime Overall  Glucose range:       Averages: 161 208  274  POST-MEAL PC Breakfast PC Lunch PC Dinner  Glucose range:     Averages: 207 224     She was seen by diabetes educator in 9/19       Side effects from medications have been: None  Compliance with the medical regimen: Fair  Typical meal intake: Breakfast is oatmeal, otherwise eggs and toast.  Lunch may be rice and beans or a sandwich at 4 pm Fruit for snacks                Dietician visit, most recent: 09/2018 CDE consultation: 07/01/2018  Weight history:  Wt Readings from Last 3 Encounters:  12/07/21 111 lb 6.4 oz (50.5 kg)  09/28/21 107 lb 9.6 oz (48.8 kg)  08/23/21 106 lb 6.4 oz (48.3 kg)    Glycemic control:   Lab Results  Component Value Date   HGBA1C 9.9 (H) 11/28/2021   HGBA1C 9.5 (H) 08/09/2021   HGBA1C 8.5 (A) 06/13/2021   Lab Results  Component Value Date   MICROALBUR 16.1 (H) 11/28/2021   LDLCALC 56 05/02/2021   CREATININE 1.74 (H) 11/28/2021    Lab Results  Component Value Date   MICRALBCREAT 51.5 (H) 11/28/2021    Lab Results  Component Value Date   FRUCTOSAMINE 435 (H) 09/25/2021   FRUCTOSAMINE 434 (H) 05/02/2021   FRUCTOSAMINE 484 (H) 09/23/2020    No visits with results within 1 Week(s) from this visit.  Latest known visit with results is:  Lab on 11/28/2021  Component Date Value Ref Range Status   Microalb, Ur 11/28/2021 16.1 (H)  0.0 - 1.9 mg/dL Final   Creatinine,U 11/28/2021 31.2  mg/dL Final   Microalb Creat Ratio 11/28/2021 51.5 (H)  0.0 - 30.0 mg/g Final   Sodium 11/28/2021 136  135 - 145 mEq/L Final   Potassium 11/28/2021 5.2 (H)  3.5 - 5.1 mEq/L Final   Chloride 11/28/2021 105  96 - 112 mEq/L Final   CO2 11/28/2021 26  19 - 32 mEq/L Final   Glucose, Bld 11/28/2021 191 (H)  70 - 99 mg/dL Final   BUN 11/28/2021 29 (H)  6 - 23 mg/dL Final   Creatinine, Ser 11/28/2021 1.74 (H)  0.40 - 1.20 mg/dL Final   GFR 11/28/2021 28.51 (L)  >60.00 mL/min Final   Calculated using the CKD-EPI Creatinine Equation (2021)   Calcium 11/28/2021 9.8  8.4 - 10.5 mg/dL Final   Hgb A1c MFr Bld 11/28/2021 9.9 (H)  4.6 - 6.5 % Final   Glycemic Control Guidelines for People with Diabetes:Non Diabetic:  <6%Goal of Therapy: <7%Additional Action Suggested:  >8%     Allergies as of 12/07/2021   No Known Allergies      Medication List        Accurate as of December 07, 2021 10:49 AM. If you have any questions, ask your nurse or doctor.          amLODipine 5 MG tablet Commonly known as: NORVASC Take 1 tablet (5 mg total) by mouth daily.   aspirin EC 81 MG tablet Take 1 tablet (81 mg total) by mouth daily. Swallow whole.   calcium carbonate 1500 (600 Ca) MG Tabs tablet Commonly known as: OSCAL Take 1 tablet by mouth 2 (two) times daily with a meal.   ezetimibe 10 MG tablet Commonly known as: Zetia Take 1 tablet (10 mg total) by mouth daily.   glucose blood test strip Use as instructed to test blood sugar 3 times  daily E11.65   hydrochlorothiazide 12.5  MG capsule Commonly known as: MICROZIDE Take 1 capsule (12.5 mg total) by mouth daily.   Lantus SoloStar 100 UNIT/ML Solostar Pen Generic drug: insulin glargine Inject 16 Units into the skin daily. INJECT 16 UNITS UNDER THE SKIN ONCE DAILY. (UPDATED DOSAGE)   losartan 50 MG tablet Commonly known as: COZAAR Take 1 tablet by mouth once daily   metoprolol tartrate 25 MG tablet Commonly known as: LOPRESSOR Take 1 tablet (25 mg total) by mouth 2 (two) times daily.   NovoLOG FlexPen 100 UNIT/ML FlexPen Generic drug: insulin aspart 6 UNITS BEFORE Breakfast on exercise days and if eating eggs 8 Units on other days LUNCH COVERAGE: NOVOLOG 8 UNITS if eating at home and 10 units for fast food Dinner 6 units Novolog before eating   Ozempic (0.25 or 0.5 MG/DOSE) 2 MG/1.5ML Sopn Generic drug: Semaglutide(0.25 or 0.5MG /DOS) Inject 0.5 mg into the skin once a week. 0.25mg  weekly for 4 weeks then 0.5mg  weekly   pioglitazone 15 MG tablet Commonly known as: ACTOS Take 1 tablet by mouth once daily   pregabalin 25 MG capsule Commonly known as: Lyrica Take 1 capsule (25 mg total) by mouth 2 (two) times daily.   rosuvastatin 20 MG tablet Commonly known as: CRESTOR Take 1 tablet (20 mg total) by mouth daily.        Allergies: No Known Allergies  Past Medical History:  Diagnosis Date   Chest pain 11/30/2020   Coronary artery disease    Diabetes mellitus without complication (HCC)    Glaucoma    History of chicken pox    Hypertension     Past Surgical History:  Procedure Laterality Date   BREAST BIOPSY Right 2017   benign   CARDIAC CATHETERIZATION     CATARACT EXTRACTION, BILATERAL Bilateral    CESAREAN SECTION     COLONOSCOPY  1980's   CORONARY ARTERY BYPASS GRAFT N/A 12/05/2020   Procedure: CORONARY ARTERY BYPASS GRAFTING (CABG), ON PUMP, TIMES FOUR, USING LEFT INTERNAL MAMMARY ARTERY AND BILATERAL ENDOSCOPICALLY HARVESTED GREATER  SAPHENOUS VEINS;  Surgeon: [name], MD;  Location: [place];  Service: Open Heart Surgery;  Laterality: N/A;   LEFT HEART CATH AND CORONARY ANGIOGRAPHY N/A 11/30/2020   Procedure: LEFT HEART CATH AND CORONARY ANGIOGRAPHY;  Surgeon: Belva Crome, MD;  Location: [place] [facility];  Service: Cardiovascular;  Laterality: N/A;   TEE WITHOUT CARDIOVERSION N/A 12/05/2020   Procedure: TRANSESOPHAGEAL ECHOCARDIOGRAM (TEE);  Surgeon: [name], MD;  Location: [place];  Service: Open Heart Surgery;  Laterality: N/A;   TUBAL LIGATION      Family History  Problem Relation Age of Onset   Hypertension Mother    Diabetes Brother    CAD Brother    Hypertension Brother    Diabetes Brother    Hypertension Brother    Colon cancer Neg Hx    Esophageal cancer Neg Hx    Pancreatic cancer Neg Hx    Liver disease Neg Hx    Stomach cancer Neg Hx    Rectal cancer Neg Hx    Colon polyps Neg Hx     Social History:  reports that she has never smoked. She has never used smokeless tobacco. She reports that she does not drink alcohol and does not use drugs.   Review of Systems   Lipid history: Currently on Crestor 20 mg Previously had not been taking it regularly with LDL as high as 226 She is also on Zetia  LDL recently below 70  Lab Results  Component Value Date   CHOL 128 05/02/2021   CHOL 163 03/03/2021   CHOL 302 (H) 11/30/2020   Lab Results  Component Value Date   HDL 58.60 05/02/2021   HDL 66.80 03/03/2021   HDL 54 11/30/2020   Lab Results  Component Value Date   LDLCALC 56 05/02/2021   LDLCALC 77 03/03/2021   LDLCALC 226 (H) 11/30/2020   Lab Results  Component Value Date   TRIG 64.0 05/02/2021   TRIG 97.0 03/03/2021   TRIG 108 11/30/2020   Lab Results  Component Value Date   CHOLHDL 2 05/02/2021   CHOLHDL 2 03/03/2021   CHOLHDL 5.6 11/30/2020   No results found for: LDLDIRECT          Hypertension: Has been present for several years, previously on  amlodipine and HCTZ  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 She is taking amlodipine and losartan, she has not taken HCTZ as she thought this was supposed to be stopped by nephrologist   BP Readings from Last 3 Encounters:  12/07/21 (!) 150/72  09/28/21 (!) 162/80  08/23/21 (!) 162/68   Renal function appears to be at abnormal again  Not on nsaids Nephrologist saw her in 1/23 and recommended Farxiga but this has not been prescribed Etiology apparently diabetes and hypertension  No history of microalbuminuria  Lab Results  Component Value Date   CREATININE 1.74 (H) 11/28/2021   CREATININE 1.38 (H) 08/23/2021   CREATININE 1.43 (H) 08/09/2021    Most recent eye exam was in 5/19  Most recent foot exam: 10/22  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  LABS:  No visits with results within 1 Week(s) from this visit.  Latest known visit with results is:  Lab on 11/28/2021  Component Date Value Ref Range Status   Microalb, Ur 11/28/2021 16.1 (H)  0.0 - 1.9 mg/dL Final   Creatinine,U 11/28/2021 31.2  mg/dL Final   Microalb Creat Ratio 11/28/2021 51.5 (H)  0.0 - 30.0 mg/g Final   Sodium 11/28/2021 136  135 - 145 mEq/L Final   Potassium 11/28/2021 5.2 (H)  3.5 - 5.1 mEq/L Final   Chloride 11/28/2021 105  96 - 112 mEq/L Final   CO2 11/28/2021 26  19 - 32 mEq/L Final   Glucose, Bld 11/28/2021 191 (H)  70 - 99 mg/dL Final   BUN 11/28/2021 29 (H)  6 - 23 mg/dL Final   Creatinine, Ser 11/28/2021 1.74 (H)  0.40 - 1.20 mg/dL Final   GFR 11/28/2021 28.51 (L)  >60.00 mL/min Final   Calculated using the CKD-EPI Creatinine Equation (2021)   Calcium 11/28/2021 9.8  8.4 - 10.5 mg/dL Final   Hgb A1c MFr Bld 11/28/2021 9.9 (H)  4.6 - 6.5 % Final   Glycemic Control Guidelines for People with Diabetes:Non Diabetic:  <6%Goal of Therapy: <7%Additional Action Suggested:  >8%     Physical Examination:  BP (!) 150/72    Pulse 79    Ht 4' 8.25" (1.429 m)    Wt 111 lb 6.4  oz (50.5 kg)    SpO2 97%    BMI 24.75 kg/m        ASSESSMENT:  Diabetes type 2, insulin-dependent    A1c is 9.9 and persistently high  See history of present illness for discussion of current diabetes management, blood sugar patterns and problems identified  She is on basal bolus insulin, Actos 15 mg  Although her blood sugars are somewhat better recently  compared to her last visit she still has a high A1c Her blood sugars are significantly high postprandially and appear to be rising in the morning She is not getting enough insulin likely for all her meals especially breakfast Also not following instructions given on each visit for monitoring and insulin doses Previously doing better with Ozempic but cannot afford it and still waiting for patient assistance program  HYPERTENSION: Blood pressure is still relatively high and she will follow-up with her PCP and nephrologist  Renal dysfunction: Somewhat worse recently and unclear of the etiology She has small kidneys on ultrasound indicating chronic nephrosclerosis  Renal mass: She is waiting for urology referral   PLAN:   Given detailed instructions again on her insulin doses and monitoring schedule  She will take 10 units NovoLog at breakfast and 8 units at other meals Continue 14 Lantus for now but needs to take it on waking up Also to start on Ozempic when available through patient assistance program  Consider consultation with dietitian or diabetes educator Trial of JARDIANCE 25 mg, half tablet daily, this apparently is better covered by insurance compared to [place] Patient information on chronic kidney disease and SGLT2 drugs given  She will hold off on the HCTZ if she is able to start Jardiance   She will call to make sure she is getting an appointment with the urologist for the renal mass evaluation   There are no Patient Instructions on file for this visit.     Elayne Snare 12/07/2021, 10:49 AM   Note: This  office note was prepared with Dragon voice recognition system technology. Any transcriptional errors that result from this process are unintentional.

## 2021-12-07 NOTE — Patient Instructions (Addendum)
Call re: [facility] Referral  Blue pen 8  units at lunch and supper not 6  Also 10 units before breakfast  Lantus 14 units daily in am   Walk daily   Check blood sugars on waking up 7 days a week   Also check blood sugars about 2 hours after meals and do this after different meals by rotation   Recommended blood sugar levels on waking up are 90-130 and about 2 hours after meal is 130-180   Please bring your blood sugar monitor to each visit, thank you  Call Dr Burna Mortimer 1/2 daily

## 2021-12-26 ENCOUNTER — Other Ambulatory Visit: Payer: Self-pay | Admitting: Endocrinology

## 2021-12-28 ENCOUNTER — Encounter: Payer: Self-pay | Admitting: Internal Medicine

## 2021-12-28 ENCOUNTER — Ambulatory Visit (INDEPENDENT_AMBULATORY_CARE_PROVIDER_SITE_OTHER): Payer: Medicare Other | Admitting: Internal Medicine

## 2021-12-28 VITALS — BP 170/80 | HR 82 | Temp 98.4°F | Wt 116.0 lb

## 2021-12-28 DIAGNOSIS — E782 Mixed hyperlipidemia: Secondary | ICD-10-CM | POA: Diagnosis not present

## 2021-12-28 DIAGNOSIS — E1122 Type 2 diabetes mellitus with diabetic chronic kidney disease: Secondary | ICD-10-CM

## 2021-12-28 DIAGNOSIS — I700 Atherosclerosis of aorta: Secondary | ICD-10-CM

## 2021-12-28 DIAGNOSIS — I2510 Atherosclerotic heart disease of native coronary artery without angina pectoris: Secondary | ICD-10-CM | POA: Diagnosis not present

## 2021-12-28 DIAGNOSIS — I10 Essential (primary) hypertension: Secondary | ICD-10-CM

## 2021-12-28 DIAGNOSIS — N1832 Chronic kidney disease, stage 3b: Secondary | ICD-10-CM

## 2021-12-28 DIAGNOSIS — Z794 Long term (current) use of insulin: Secondary | ICD-10-CM

## 2021-12-28 MED ORDER — AMLODIPINE BESYLATE 5 MG PO TABS: 10.0000 mg | ORAL_TABLET | Freq: Every day | ORAL | 1 refills | Status: DC

## 2021-12-28 MED ORDER — AMLODIPINE BESYLATE 10 MG PO TABS: 10.0000 mg | ORAL_TABLET | Freq: Every day | ORAL | 1 refills | Status: DC

## 2021-12-28 MED ORDER — LOSARTAN POTASSIUM 100 MG PO TABS: 100.0000 mg | ORAL_TABLET | Freq: Every day | ORAL | 1 refills | Status: DC

## 2021-12-28 MED ORDER — METOPROLOL TARTRATE 25 MG PO TABS: 25.0000 mg | ORAL_TABLET | Freq: Two times a day (BID) | ORAL | 1 refills | Status: DC

## 2021-12-28 NOTE — Progress Notes (Signed)
Established Patient Office Visit     This visit occurred during the SARS-CoV-2 public health emergency.  Safety protocols were in place, including screening questions prior to the visit, additional usage of staff PPE, and extensive cleaning of exam room while observing appropriate contact time as indicated for disinfecting solutions.    CC/Reason for Visit: Follow-up blood pressure  HPI: Mariah Peterson is a 76 y.o. female who is coming in today for the above mentioned reasons. Past Medical History is significant for: Hypertension, hyperlipidemia, insulin-dependent diabetes followed by Dr. Dwyane Dee.  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  She is followed by thoracic surgeon Dr. Kipp Brood, her cardiologist is Dr. Marlou Porch.  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  Her A1c increased to 9.9 and she is working with her endocrinologist in this regard.  Her blood pressure has been elevated the last few visits.  She is supposed to be on losartan 50, amlodipine 5 and metoprolol 25 twice daily.  She has been having some mild lower extremity edema towards the end of the day, worse since being taken off hydrochlorothiazide by her nephrologist.   Past Medical/Surgical History: Past Medical History:  Diagnosis Date   Chest pain 11/30/2020   Coronary artery disease    Diabetes mellitus without complication (HCC)    Glaucoma    History of chicken pox    Hypertension     Past Surgical History:  Procedure Laterality Date   BREAST BIOPSY Right 2017   benign   CARDIAC CATHETERIZATION     CATARACT EXTRACTION, BILATERAL Bilateral    CESAREAN SECTION     COLONOSCOPY  1980's   CORONARY ARTERY BYPASS GRAFT N/A 12/05/2020   Procedure: CORONARY ARTERY BYPASS GRAFTING (CABG), ON PUMP, TIMES FOUR, USING LEFT INTERNAL MAMMARY ARTERY AND BILATERAL ENDOSCOPICALLY HARVESTED GREATER SAPHENOUS  VEINS;  Surgeon: [name], MD;  Location: [place];  Service: Open Heart Surgery;  Laterality: N/A;   LEFT HEART CATH AND CORONARY ANGIOGRAPHY N/A 11/30/2020   Procedure: LEFT HEART CATH AND CORONARY ANGIOGRAPHY;  Surgeon: Belva Crome, MD;  Location: [place] [facility];  Service: Cardiovascular;  Laterality: N/A;   TEE WITHOUT CARDIOVERSION N/A 12/05/2020   Procedure: TRANSESOPHAGEAL ECHOCARDIOGRAM (TEE);  Surgeon: [name], MD;  Location: [place];  Service: Open Heart Surgery;  Laterality: N/A;   TUBAL LIGATION      Social History:  reports that she has never smoked. She has never used smokeless tobacco. She reports that she does not drink alcohol and does not use drugs.  Allergies: No Known Allergies  Family History:  Family History  Problem Relation Age of Onset   Hypertension Mother    Diabetes Brother    CAD Brother    Hypertension Brother    Diabetes Brother    Hypertension Brother    Colon cancer Neg Hx    Esophageal cancer Neg Hx    Pancreatic cancer Neg Hx    Liver disease Neg Hx    Stomach cancer Neg Hx    Rectal cancer Neg Hx    Colon polyps Neg Hx      Current Outpatient Medications:    aspirin EC 81 MG tablet, Take 1 tablet (81 mg total) by mouth daily. Swallow whole., Disp: 90 tablet, Rfl: 3   calcium carbonate (OSCAL) 1500 (600 Ca) MG TABS tablet, Take 1  tablet by mouth 2 (two) times daily with a meal., Disp: , Rfl:    empagliflozin (JARDIANCE) 25 MG TABS tablet, Take 1 tablet (25 mg total) by mouth daily before breakfast., Disp: 30 tablet, Rfl: 3   ezetimibe (ZETIA) 10 MG tablet, Take 1 tablet (10 mg total) by mouth daily., Disp: 90 tablet, Rfl: 3   glucose blood test strip, Use as instructed to test blood sugar 3 times daily E11.65, Disp: 270 each, Rfl: 1   insulin aspart (NOVOLOG FLEXPEN) 100 UNIT/ML FlexPen, 6 UNITS BEFORE Breakfast on exercise days and if eating eggs 8 Units on other days LUNCH COVERAGE: NOVOLOG 8 UNITS if eating at home  and 10 units for fast food Dinner 6 units Novolog before eating, Disp: 15 mL, Rfl: 3   insulin glargine (LANTUS SOLOSTAR) 100 UNIT/ML Solostar Pen, Inject 16 Units into the skin daily. INJECT 16 UNITS UNDER THE SKIN ONCE DAILY. (UPDATED DOSAGE), Disp: 15 mL, Rfl: 3   pioglitazone (ACTOS) 15 MG tablet, Take 1 tablet by mouth once daily, Disp: 30 tablet, Rfl: 2   pregabalin (LYRICA) 25 MG capsule, Take 1 capsule (25 mg total) by mouth 2 (two) times daily., Disp: 60 capsule, Rfl: 0   rosuvastatin (CRESTOR) 20 MG tablet, Take 1 tablet (20 mg total) by mouth daily., Disp: 30 tablet, Rfl: 5   Semaglutide,0.25 or 0.'5MG'$ /DOS, (OZEMPIC, 0.25 OR 0.5 MG/DOSE,) 2 MG/1.5ML SOPN, Inject 0.5 mg into the skin once a week. 0.'25mg'$  weekly for 4 weeks then 0.'5mg'$  weekly, Disp: 1.5 mL, Rfl: 2   amLODipine (NORVASC) 5 MG tablet, Take 2 tablets (10 mg total) by mouth daily., Disp: 90 tablet, Rfl: 1   losartan (COZAAR) 100 MG tablet, Take 1 tablet (100 mg total) by mouth daily., Disp: 90 tablet, Rfl: 1   metoprolol tartrate (LOPRESSOR) 25 MG tablet, Take 1 tablet (25 mg total) by mouth 2 (two) times daily., Disp: 90 tablet, Rfl: 1  Current Facility-Administered Medications:    diclofenac Sodium (VOLTAREN) 1 % topical gel 2 g, 2 g, Topical, TID, Lars Pinks M, PA-C  Review of Systems:  Constitutional: Denies fever, chills, diaphoresis, appetite change and fatigue.  HEENT: Denies photophobia, eye pain, redness, hearing loss, ear pain, congestion, sore throat, rhinorrhea, sneezing, mouth sores, trouble swallowing, neck pain, neck stiffness and tinnitus.   Respiratory: Denies SOB, DOE, cough, chest tightness,  and wheezing.   Cardiovascular: Denies chest pain, palpitations. Gastrointestinal: Denies nausea, vomiting, abdominal pain, diarrhea, constipation, blood in stool and abdominal distention.  Genitourinary: Denies dysuria, urgency, frequency, hematuria, flank pain and difficulty urinating.  Endocrine: Denies: hot  or cold intolerance, sweats, changes in hair or nails, polyuria, polydipsia. Musculoskeletal: Denies myalgias, back pain, joint swelling, arthralgias and gait problem.  Skin: Denies pallor, rash and wound.  Neurological: Denies dizziness, seizures, syncope, weakness, light-headedness, numbness and headaches.  Hematological: Denies adenopathy. Easy bruising, personal or family bleeding history  Psychiatric/Behavioral: Denies suicidal ideation, mood changes, confusion, nervousness, sleep disturbance and agitation    Physical Exam: Vitals:   12/28/21 1001  BP: (!) 170/80  Pulse: 82  Temp: 98.4 F (36.9 C)  TempSrc: Oral  SpO2: 99%  Weight: 116 lb (52.6 kg)    Body mass index is 25.78 kg/m.   Constitutional: NAD, calm, comfortable Eyes: PERRL, lids and conjunctivae normal, wears corrective lenses ENMT: Mucous membranes are moist.  Respiratory: clear to auscultation bilaterally, no wheezing, no crackles. Normal respiratory effort. No accessory muscle use.  Cardiovascular: Regular rate and rhythm, no murmurs / rubs /  gallops.  1+ pitting edema around ankles bilaterally  Neurologic: Grossly intact and nonfocal Psychiatric: Normal judgment and insight. Alert and oriented x 3. Normal mood.    Impression and Plan:  Primary hypertension  - Plan: metoprolol tartrate (LOPRESSOR) 25 MG tablet, losartan (COZAAR) 100 MG tablet, amLODipine (NORVASC) 5 MG tablet -I will increase amlodipine from 5 to 10 mg daily, increase losartan from 50-100, make sure she is taking metoprolol 25 mg twice daily as refills would indicate that she is not. -Check renal function when she returns for follow-up in 6 weeks.  Mixed hyperlipidemia -LDL is at goal at 73 as of July 2022  Type 2 diabetes mellitus with stage 3b chronic kidney disease, with long-term current use of insulin (HCC) -Recent A1c had increased to 9.9, she is followed by endocrinology.  Coronary artery disease involving native coronary  artery of native heart without angina pectoris -Follows with cardiology, compensated, no chest pain  Aortic atherosclerosis ([place]) -Noted, on statin and aspirin.  Time spent: 33 minutes reviewing chart, interviewing and examining patient and formulating plan of care.   Patient Instructions  -Nice seeing you today!!  -Increase amlodipine to 10 mg daily.  -Increase losartan to 100 mg daily.  -Make sure you are taking metoprolol 25 mg twice daily.  -Check your BP at home 3 times a week and bring measurements into your next visit in 6 weeks.    Lelon Frohlich, MD [facility]

## 2021-12-28 NOTE — Patient Instructions (Signed)
-  Nice seeing you today!! ? ?-Increase amlodipine to 10 mg daily. ? ?-Increase losartan to 100 mg daily. ? ?-Make sure you are taking metoprolol 25 mg twice daily. ? ?-Check your BP at home 3 times a week and bring measurements into your next visit in 6 weeks. ?

## 2022-01-02 ENCOUNTER — Other Ambulatory Visit: Payer: Self-pay | Admitting: Endocrinology

## 2022-01-11 ENCOUNTER — Other Ambulatory Visit: Payer: Self-pay

## 2022-01-23 ENCOUNTER — Other Ambulatory Visit (INDEPENDENT_AMBULATORY_CARE_PROVIDER_SITE_OTHER): Payer: Medicare Other

## 2022-01-23 ENCOUNTER — Telehealth: Payer: Self-pay

## 2022-01-23 DIAGNOSIS — E1165 Type 2 diabetes mellitus with hyperglycemia: Secondary | ICD-10-CM | POA: Diagnosis not present

## 2022-01-23 DIAGNOSIS — E7800 Pure hypercholesterolemia, unspecified: Secondary | ICD-10-CM | POA: Diagnosis not present

## 2022-01-23 DIAGNOSIS — Z794 Long term (current) use of insulin: Secondary | ICD-10-CM | POA: Diagnosis not present

## 2022-01-23 LAB — BASIC METABOLIC PANEL
BUN: 25 mg/dL — ABNORMAL HIGH (ref 6–23)
CO2: 23 mEq/L (ref 19–32)
Calcium: 9.3 mg/dL (ref 8.4–10.5)
Chloride: 111 mEq/L (ref 96–112)
Creatinine, Ser: 1.53 mg/dL — ABNORMAL HIGH (ref 0.40–1.20)
GFR: 33.24 mL/min — ABNORMAL LOW (ref >60.00)
Glucose, Bld: 46 mg/dL — CL (ref 70–99)
Potassium: 4.2 mEq/L (ref 3.5–5.1)
Sodium: 140 mEq/L (ref 135–145)

## 2022-01-23 LAB — LDL CHOLESTEROL, DIRECT: Direct LDL: 33 mg/dL

## 2022-01-23 NOTE — Telephone Encounter (Signed)
Received message that patient had a critical low of 46 when blood was drawn this morning. Called patient she states that she did eat before coming but when she got home and tested blood sugar meter read low. She checked blood sugar before getting off the phone with me and it was 117. ?

## 2022-01-23 NOTE — Telephone Encounter (Signed)
Noted  

## 2022-01-24 ENCOUNTER — Telehealth: Payer: Self-pay

## 2022-01-24 LAB — FRUCTOSAMINE: Fructosamine: 337 umol/L — ABNORMAL HIGH (ref 0–285)

## 2022-01-24 NOTE — Telephone Encounter (Signed)
Patient called in states she received a letter from [employer] that they can't send her supplies because they have not received all documentation needed from office. Informed patient we have not received anything. I did get telephone number from patient and reached out to company. Gave fax number and they will resend it today. ?

## 2022-01-26 ENCOUNTER — Other Ambulatory Visit: Payer: Medicare Other

## 2022-01-31 ENCOUNTER — Ambulatory Visit (INDEPENDENT_AMBULATORY_CARE_PROVIDER_SITE_OTHER): Payer: Medicare Other | Admitting: Endocrinology

## 2022-01-31 VITALS — BP 174/86 | HR 87 | Ht <= 58 in | Wt 116.2 lb

## 2022-01-31 DIAGNOSIS — E1165 Type 2 diabetes mellitus with hyperglycemia: Secondary | ICD-10-CM | POA: Diagnosis not present

## 2022-01-31 DIAGNOSIS — Z794 Long term (current) use of insulin: Secondary | ICD-10-CM | POA: Diagnosis not present

## 2022-01-31 DIAGNOSIS — I10 Essential (primary) hypertension: Secondary | ICD-10-CM

## 2022-01-31 DIAGNOSIS — I2510 Atherosclerotic heart disease of native coronary artery without angina pectoris: Secondary | ICD-10-CM | POA: Diagnosis not present

## 2022-01-31 LAB — POCT GLYCOSYLATED HEMOGLOBIN (HGB A1C): Hemoglobin A1C: 7 % — AB (ref 4.0–5.6)

## 2022-01-31 MED ORDER — FUROSEMIDE 20 MG PO TABS: 20.0000 mg | ORAL_TABLET | Freq: Every day | ORAL | 0 refills | Status: DC

## 2022-01-31 MED ORDER — CLONIDINE HCL 0.1 MG PO TABS: 0.1000 mg | ORAL_TABLET | Freq: Two times a day (BID) | ORAL | 0 refills | Status: DC

## 2022-01-31 NOTE — Patient Instructions (Signed)
LANTUS 12 UNITS ?NOVOLOG 6 UNITS before each meal ? ?Must check sugar at bedtime daily ?

## 2022-01-31 NOTE — Progress Notes (Signed)
? ?Patient ID: Mariah Peterson, female   DOB: 1946/03/19, 76 y.o.   MRN: [id number] ?       ? ? ?Reason for Appointment: Follow-up  ? ? ?History of Present Illness:  ?        ?Date of diagnosis of type 2 diabetes mellitus:?  1990      ? ?Background history:  ? ?She was started on insulin about 4 years ago and previously taking metformin and glipizide ?No detailed history is available and she does not think she has taken any other types of diabetes medications or injections ? ?Recent history:  ? ? ?INSULIN regimen is: Lantus 14 units daily in am, Novolog 10 units at breakfast, 6 units at lunch and supper ? ?Non-insulin hypoglycemic drugs the patient is taking are:   Ozempic 0.25 mg weekly, ? ?Current management, blood sugar patterns and problems identified: ? ?Her A1c is much better at 7% compared to 9.5 ? ?Fructosamine is relatively better at 337 ? ? ?She was told to increase her NovoLog insulin doses at all times but she only increased her morning dose  ?However with starting [place] her blood sugars appear to be dramatically better and recently averaging only 115 although not clear if her sensor is accurate ?As before she forgets to check her sugars after dinner and minimal data is available for the afternoons and evenings  ?She says that her DME supplier is not giving her refills because of further information related  ?She again thinks that she is taking all her injections regularly and has taken NovoLog with her meals ?She did not call to report low blood sugars and has not compared with fingersticks ?However her lab glucose was 46 late morning, likely asymptomatic ?Her nephrologist recommended Wilder Glade which was not approved and Jardiance apparently was too expensive and she did not start ? ?Weight has gone up 5 pounds ? ?Breakfast usually 8-9 am, lunch 1 PM, supper 6 PM  ? ? ?Glucose sensor: Freestyle libre  ? ?Interpretation of the blood glucose data from download between 3/25 and 01/26/2022 as follows ? ?Data is  incomplete between about 2 PM-10 PM  ?Her average blood sugar at 2-hour intervals ranges from a low of 82 at 11 AM and highest likely 234 around 7 PM but with completely available data highest is only 147 at 11 PM ? HYPERGLYCEMIA has occurred only once on 3/26 following low blood sugars and twice late at night after dinner  ?HYPOGLYCEMIA appears to be occurring fairly regularly after breakfast and noticed on at least 4 days out of the 14 days evaluated ?Blood sugars are fairly close to normal or low normal overnight ?Pre-meal blood sugars are near target before lunch ? ? ? ?CGM use % of time 59  ?2-week average/GV 167  ?Time in range     64, was 46   %  ?% Time Above 180 28+7  ?% Time above 250   ?% Time Below 70 1  ? ?  ?PRE-MEAL Fasting Lunch Dinner Bedtime Overall  ?Glucose range:       ?Averages: 139   196   ? ?POST-MEAL PC Breakfast PC Lunch PC Dinner  ?Glucose range:     ?Averages: 207 204 218  ? ? ? ?She was seen by diabetes educator in 9/19 ?      ?Side effects from medications have been: None ? ?Compliance with the medical regimen: Fair ? ?Typical meal intake: Breakfast is oatmeal, otherwise eggs and toast.  Lunch  may be rice and beans or a sandwich at 4 pm ?Fruit for snacks              ? ? ?Dietician visit, most recent: 09/2018 ?CDE consultation: 07/01/2018 ? ?Weight history: ? ?Wt Readings from Last 3 Encounters:  ?01/31/22 116 lb 3.2 oz (52.7 kg)  ?12/28/21 116 lb (52.6 kg)  ?12/07/21 111 lb 6.4 oz (50.5 kg)  ? ? ?Glycemic control: ?  ?Lab Results  ?Component Value Date  ? HGBA1C 7.0 (A) 01/31/2022  ? HGBA1C 9.9 (H) 11/28/2021  ? HGBA1C 9.5 (H) 08/09/2021  ? ?Lab Results  ?Component Value Date  ? MICROALBUR 16.1 (H) 11/28/2021  ? LDLCALC 56 05/02/2021  ? CREATININE 1.53 (H) 01/23/2022  ? ?Lab Results  ?Component Value Date  ? MICRALBCREAT 51.5 (H) 11/28/2021  ? ? ?Lab Results  ?Component Value Date  ? FRUCTOSAMINE 337 (H) 01/23/2022  ? FRUCTOSAMINE 435 (H) 09/25/2021  ? FRUCTOSAMINE 434 (H) 05/02/2021   ? ? ?Office Visit on 01/31/2022  ?Component Date Value Ref Range Status  ? Hemoglobin A1C 01/31/2022 7.0 (A)  4.0 - 5.6 % Final  ? ? ?Allergies as of 01/31/2022   ?No Known Allergies ?  ? ?  ?Medication List  ?  ? ?  ? Accurate as of January 31, 2022 11:52 AM. If you have any questions, ask your nurse or doctor.  ?  ?  ? ?  ? ?amLODipine 10 MG tablet ?Commonly known as: NORVASC ?Take 1 tablet (10 mg total) by mouth daily. ?  ?aspirin EC 81 MG tablet ?Take 1 tablet (81 mg total) by mouth daily. Swallow whole. ?  ?calcium carbonate 1500 (600 Ca) MG Tabs tablet ?Commonly known as: OSCAL ?Take 1 tablet by mouth 2 (two) times daily with a meal. ?  ?empagliflozin 25 MG Tabs tablet ?Commonly known as: Jardiance ?Take 1 tablet (25 mg total) by mouth daily before breakfast. ?  ?ezetimibe 10 MG tablet ?Commonly known as: Zetia ?Take 1 tablet (10 mg total) by mouth daily. ?  ?glucose blood test strip ?Use as instructed to test blood sugar 3 times daily E11.65 ?  ?Lantus SoloStar 100 UNIT/ML Solostar Pen ?Generic drug: insulin glargine ?Inject 16 Units into the skin daily. INJECT 16 UNITS UNDER THE SKIN ONCE DAILY. (UPDATED DOSAGE) ?  ?losartan 100 MG tablet ?Commonly known as: COZAAR ?Take 1 tablet (100 mg total) by mouth daily. ?  ?metoprolol tartrate 25 MG tablet ?Commonly known as: LOPRESSOR ?Take 1 tablet (25 mg total) by mouth 2 (two) times daily. ?  ?NovoLOG FlexPen 100 UNIT/ML FlexPen ?Generic drug: insulin aspart ?6 UNITS BEFORE Breakfast on exercise days and if eating eggs 8 Units on other days LUNCH COVERAGE: NOVOLOG 8 UNITS if eating at home and 10 units for fast food Dinner 6 units Novolog before eating ?  ?Ozempic (0.25 or 0.5 MG/DOSE) 2 MG/1.5ML Sopn ?Generic drug: Semaglutide(0.25 or 0.'5MG'$ /DOS) ?Inject 0.5 mg into the skin once a week. 0.'25mg'$  weekly for 4 weeks then 0.'5mg'$  weekly ?  ?pioglitazone 15 MG tablet ?Commonly known as: ACTOS ?Take 1 tablet by mouth once daily ?  ?pregabalin 25 MG capsule ?Commonly known  as: Lyrica ?Take 1 capsule (25 mg total) by mouth 2 (two) times daily. ?  ?rosuvastatin 20 MG tablet ?Commonly known as: CRESTOR ?Take 1 tablet by mouth once daily ?  ? ?  ? ? ?Allergies: No Known Allergies ? ?Past Medical History:  ?Diagnosis Date  ? Chest pain 11/30/2020  ? Coronary  artery disease   ? Diabetes mellitus without complication ([place])   ? Glaucoma   ? History of chicken pox   ? Hypertension   ? ? ?Past Surgical History:  ?Procedure Laterality Date  ? BREAST BIOPSY Right 2017  ? benign  ? CARDIAC CATHETERIZATION    ? CATARACT EXTRACTION, BILATERAL Bilateral   ? CESAREAN SECTION    ? COLONOSCOPY  1980's  ? CORONARY ARTERY BYPASS GRAFT N/A 12/05/2020  ? Procedure: CORONARY ARTERY BYPASS GRAFTING (CABG), ON PUMP, TIMES FOUR, USING LEFT INTERNAL MAMMARY ARTERY AND BILATERAL ENDOSCOPICALLY HARVESTED GREATER SAPHENOUS VEINS;  Surgeon: [name], MD;  Location: [facility];  Service: Open Heart Surgery;  Laterality: N/A;  ? LEFT HEART CATH AND CORONARY ANGIOGRAPHY N/A 11/30/2020  ? Procedure: LEFT HEART CATH AND CORONARY ANGIOGRAPHY;  Surgeon: Belva Crome, MD;  Location: [facility];  Service: Cardiovascular;  Laterality: N/A;  ? TEE WITHOUT CARDIOVERSION N/A 12/05/2020  ? Procedure: TRANSESOPHAGEAL ECHOCARDIOGRAM (TEE);  Surgeon: [name], MD;  Location: [facility];  Service: Open Heart Surgery;  Laterality: N/A;  ? TUBAL LIGATION    ? ? ?Family History  ?Problem Relation Age of Onset  ? Hypertension Mother   ? Diabetes Brother   ? CAD Brother   ? Hypertension Brother   ? Diabetes Brother   ? Hypertension Brother   ? Colon cancer Neg Hx   ? Esophageal cancer Neg Hx   ? Pancreatic cancer Neg Hx   ? Liver disease Neg Hx   ? Stomach cancer Neg Hx   ? Rectal cancer Neg Hx   ? Colon polyps Neg Hx   ? ? ?Social History:  reports that she has never smoked. She has never used smokeless tobacco. She reports that she does not drink alcohol and does not use drugs. ? ? ?Review of Systems ? ? ?Lipid  history: Currently on Crestor 20 mg ?Previously had not been taking it regularly with LDL as high as 226 ?She is also on Zetia ? ?LDL recently below 70 ? ?Lab Results  ?Component Value Date  ? CHOL 128 07/12/

## 2022-02-08 ENCOUNTER — Encounter: Payer: Self-pay | Admitting: Internal Medicine

## 2022-02-08 ENCOUNTER — Ambulatory Visit (INDEPENDENT_AMBULATORY_CARE_PROVIDER_SITE_OTHER): Payer: Medicare Other | Admitting: Internal Medicine

## 2022-02-08 VITALS — BP 130/56 | HR 65 | Temp 98.0°F | Wt 115.3 lb

## 2022-02-08 DIAGNOSIS — Z794 Long term (current) use of insulin: Secondary | ICD-10-CM

## 2022-02-08 DIAGNOSIS — R600 Localized edema: Secondary | ICD-10-CM | POA: Diagnosis not present

## 2022-02-08 DIAGNOSIS — I2510 Atherosclerotic heart disease of native coronary artery without angina pectoris: Secondary | ICD-10-CM

## 2022-02-08 DIAGNOSIS — E782 Mixed hyperlipidemia: Secondary | ICD-10-CM

## 2022-02-08 DIAGNOSIS — E1122 Type 2 diabetes mellitus with diabetic chronic kidney disease: Secondary | ICD-10-CM

## 2022-02-08 DIAGNOSIS — I10 Essential (primary) hypertension: Secondary | ICD-10-CM | POA: Diagnosis not present

## 2022-02-08 DIAGNOSIS — N1832 Chronic kidney disease, stage 3b: Secondary | ICD-10-CM

## 2022-02-08 MED ORDER — AMLODIPINE BESYLATE 5 MG PO TABS: 5.0000 mg | ORAL_TABLET | Freq: Every day | ORAL | 1 refills | Status: DC

## 2022-02-08 NOTE — Progress Notes (Signed)
? ? ? ?Established Patient Office Visit ? ? ? ? ?This visit occurred during the SARS-CoV-2 public health emergency.  Safety protocols were in place, including screening questions prior to the visit, additional usage of staff PPE, and extensive cleaning of exam room while observing appropriate contact time as indicated for disinfecting solutions.  ? ? ?CC/Reason for Visit: Follow-up chronic conditions, complains of leg edema ? ?HPI: Mariah Peterson is a 76 y.o. female who is coming in today for the above mentioned reasons. Past Medical History is significant for:  Hypertension, hyperlipidemia, insulin-dependent diabetes followed by Dr. Dwyane Dee.  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  She is followed by thoracic surgeon Dr. Kipp Brood, her cardiologist is Dr. Marlou Porch.  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  We have been dealing with uncontrolled blood pressure.  At our last visit in March we increased her amlodipine from 5 to 10 mg.  She has developed significant peripheral edema since.  She most recently saw her endocrinologist who started her on furosemide 20 mg daily and clonidine 0.1 mg twice daily.  She is otherwise feeling well and has no acute concerns or complaints.  She has not seen her cardiologist in some time.  She has follow-up with her nephrologist in May. ? ? ?Past Medical/Surgical History: ?Past Medical History:  ?Diagnosis Date  ? Chest pain 11/30/2020  ? Coronary artery disease   ? Diabetes mellitus without complication ([place])   ? Glaucoma   ? History of chicken pox   ? Hypertension   ? ? ?Past Surgical History:  ?Procedure Laterality Date  ? BREAST BIOPSY Right 2017  ? benign  ? CARDIAC CATHETERIZATION    ? CATARACT EXTRACTION, BILATERAL Bilateral   ? CESAREAN SECTION    ? COLONOSCOPY  1980's  ? CORONARY ARTERY BYPASS GRAFT N/A 12/05/2020  ? Procedure: CORONARY ARTERY BYPASS GRAFTING  (CABG), ON PUMP, TIMES FOUR, USING LEFT INTERNAL MAMMARY ARTERY AND BILATERAL ENDOSCOPICALLY HARVESTED GREATER SAPHENOUS VEINS;  Surgeon: [name], MD;  Location: [facility];  Service: Open Heart Surgery;  Laterality: N/A;  ? LEFT HEART CATH AND CORONARY ANGIOGRAPHY N/A 11/30/2020  ? Procedure: LEFT HEART CATH AND CORONARY ANGIOGRAPHY;  Surgeon: Belva Crome, MD;  Location: [facility];  Service: Cardiovascular;  Laterality: N/A;  ? TEE WITHOUT CARDIOVERSION N/A 12/05/2020  ? Procedure: TRANSESOPHAGEAL ECHOCARDIOGRAM (TEE);  Surgeon: [name], MD;  Location: [facility];  Service: Open Heart Surgery;  Laterality: N/A;  ? TUBAL LIGATION    ? ? ?Social History: ? reports that she has never smoked. She has never used smokeless tobacco. She reports that she does not drink alcohol and does not use drugs. ? ?Allergies: ?No Known Allergies ? ?Family History:  ?Family History  ?Problem Relation Age of Onset  ? Hypertension Mother   ? Diabetes Brother   ? CAD Brother   ? Hypertension Brother   ? Diabetes Brother   ? Hypertension Brother   ? Colon cancer Neg Hx   ? Esophageal cancer Neg Hx   ? Pancreatic cancer Neg Hx   ? Liver disease Neg Hx   ? Stomach cancer Neg Hx   ? Rectal cancer Neg Hx   ? Colon polyps Neg Hx   ? ? ? ?Current Outpatient Medications:  ?  aspirin EC 81 MG tablet, Take 1 tablet (81 mg total)  by mouth daily. Swallow whole., Disp: 90 tablet, Rfl: 3 ?  calcium carbonate (OSCAL) 1500 (600 Ca) MG TABS tablet, Take 1 tablet by mouth 2 (two) times daily with a meal., Disp: , Rfl:  ?  cloNIDine (CATAPRES) 0.1 MG tablet, Take 1 tablet (0.1 mg total) by mouth 2 (two) times daily., Disp: 60 tablet, Rfl: 0 ?  ezetimibe (ZETIA) 10 MG tablet, Take 1 tablet (10 mg total) by mouth daily., Disp: 90 tablet, Rfl: 3 ?  furosemide (LASIX) 20 MG tablet, Take 1 tablet (20 mg total) by mouth daily., Disp: 30 tablet, Rfl: 0 ?  glucose blood test strip, Use as instructed to test blood sugar 3 times daily E11.65,  Disp: 270 each, Rfl: 1 ?  insulin aspart (NOVOLOG FLEXPEN) 100 UNIT/ML FlexPen, 6 UNITS BEFORE Breakfast on exercise days and if eating eggs 8 Units on other days LUNCH COVERAGE: NOVOLOG 8 UNITS if eating at home and 10 units for fast food Dinner 6 units Novolog before eating, Disp: 15 mL, Rfl: 3 ?  insulin glargine (LANTUS SOLOSTAR) 100 UNIT/ML Solostar Pen, Inject 16 Units into the skin daily. INJECT 16 UNITS UNDER THE SKIN ONCE DAILY. (UPDATED DOSAGE), Disp: 15 mL, Rfl: 3 ?  losartan (COZAAR) 100 MG tablet, Take 1 tablet (100 mg total) by mouth daily., Disp: 90 tablet, Rfl: 1 ?  metoprolol tartrate (LOPRESSOR) 25 MG tablet, Take 1 tablet (25 mg total) by mouth 2 (two) times daily., Disp: 90 tablet, Rfl: 1 ?  pioglitazone (ACTOS) 15 MG tablet, Take 1 tablet by mouth once daily, Disp: 30 tablet, Rfl: 2 ?  pregabalin (LYRICA) 25 MG capsule, Take 1 capsule (25 mg total) by mouth 2 (two) times daily., Disp: 60 capsule, Rfl: 0 ?  rosuvastatin (CRESTOR) 20 MG tablet, Take 1 tablet by mouth once daily, Disp: 30 tablet, Rfl: 2 ?  Semaglutide,0.25 or 0.'5MG'$ /DOS, (OZEMPIC, 0.25 OR 0.5 MG/DOSE,) 2 MG/1.5ML SOPN, Inject 0.5 mg into the skin once a week. 0.'25mg'$  weekly for 4 weeks then 0.'5mg'$  weekly, Disp: 1.5 mL, Rfl: 2 ?  amLODipine (NORVASC) 5 MG tablet, Take 1 tablet (5 mg total) by mouth daily., Disp: 90 tablet, Rfl: 1 ? ?Current Facility-Administered Medications:  ?  diclofenac Sodium (VOLTAREN) 1 % topical gel 2 g, 2 g, Topical, TID, Nani Skillern, PA-C ? ?Review of Systems:  ?Constitutional: Denies fever, chills, diaphoresis, appetite change and fatigue.  ?HEENT: Denies photophobia, eye pain, redness, hearing loss, ear pain, congestion, sore throat, rhinorrhea, sneezing, mouth sores, trouble swallowing, neck pain, neck stiffness and tinnitus.   ?Respiratory: Denies SOB, DOE, cough, chest tightness,  and wheezing.   ?Cardiovascular: Denies chest pain, palpitations. ?Gastrointestinal: Denies nausea, vomiting,  abdominal pain, diarrhea, constipation, blood in stool and abdominal distention.  ?Genitourinary: Denies dysuria, urgency, frequency, hematuria, flank pain and difficulty urinating.  ?Endocrine: Denies: hot or cold intolerance, sweats, changes in hair or nails, polyuria, polydipsia. ?Musculoskeletal: Denies myalgias, back pain, joint swelling, arthralgias and gait problem.  ?Skin: Denies pallor, rash and wound.  ?Neurological: Denies dizziness, seizures, syncope, weakness, light-headedness, numbness and headaches.  ?Hematological: Denies adenopathy. Easy bruising, personal or family bleeding history  ?Psychiatric/Behavioral: Denies suicidal ideation, mood changes, confusion, nervousness, sleep disturbance and agitation ? ? ? ?Physical Exam: ?Vitals:  ? 02/08/22 0958 02/08/22 1002  ?BP: (!) 140/50 (!) 130/56  ?Pulse: 65   ?Temp: 98 ?F (36.7 ?C)   ?TempSrc: Oral   ?SpO2: 96%   ?Weight: 115 lb 4.8 oz (52.3 kg)   ? ? ?  Body mass index is 25.62 kg/m?. ? ? ?Constitutional: NAD, calm, comfortable ?Eyes: PERRL, lids and conjunctivae normal ?ENMT: Mucous membranes are moist.  ?Respiratory: clear to auscultation bilaterally, no wheezing, no crackles. Normal respiratory effort. No accessory muscle use.  ?Cardiovascular: Regular rate and rhythm, no murmurs / rubs / gallops.  2-3+ pitting bilateral lower extremity edema. ?Neurologic: Grossly intact and nonfocal ?Psychiatric: Normal judgment and insight. Alert and oriented x 3. Normal mood.  ? ? ?Impression and Plan: ? ?Mixed hyperlipidemia ?-On rosuvastatin.  Most recent LDL was at goal at 56 in 7/22. ? ?Primary hypertension  ?- Plan: amLODipine (NORVASC) 5 MG tablet ?-I will decrease amlodipine from 10 to 5 mg due to peripheral edema.  Recently clonidine and furosemide were added to her metoprolol, losartan by endocrinology, blood pressure control is improved. ? ?Type 2 diabetes mellitus with stage 3b chronic kidney disease, with long-term current use of insulin ([place]) ?-Followed  by endocrinology with most recent A1c of 7. ? ?Bilateral edema of lower extremity ?-Suspect high-dose amlodipine may be the reason, decreased to 5 mg. ? ? ?Time spent:32 minutes reviewing chart, intervie

## 2022-02-08 NOTE — Patient Instructions (Signed)
-  Nice seeing you today!! ? ?-Decrease amlodipine to 5 mg daily. ? ?-Schedule follow up in 4 months. ? ?-Make sure you schedule follow up with your heart doctor as well. ?

## 2022-02-12 IMAGING — CT CT CHEST W/O CM
2 of 4 series · 15 of 36 positions shown, 18 images · non-contrast
Comparison: Radiograph 11/29/2020

CLINICAL DATA: Centralized chest pain and cough which began this
morning

EXAM:
CT CHEST WITHOUT CONTRAST
TECHNIQUE: Multidetector CT imaging of the chest was performed following the
standard protocol without IV contrast.

[Series 3: chest wo · axial · 0.66mm/px · z∈[+1039,+1259]mm · 12 of 131 slices shown, 15 images]
[im 11/131  mediastinal]
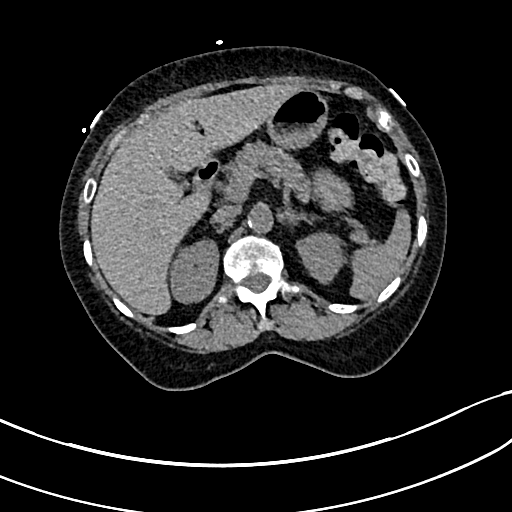
[im 11/131  lung]
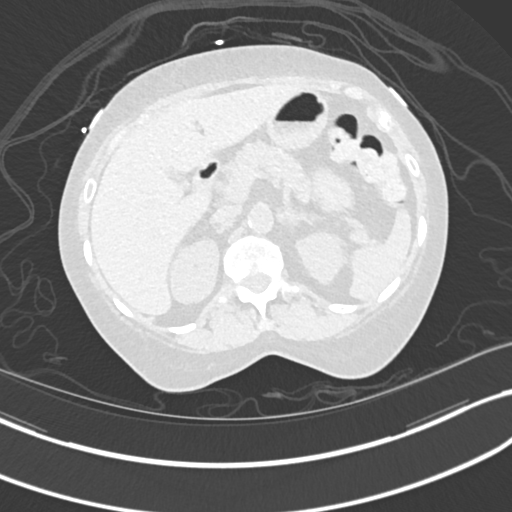
[im 21/131  lung]
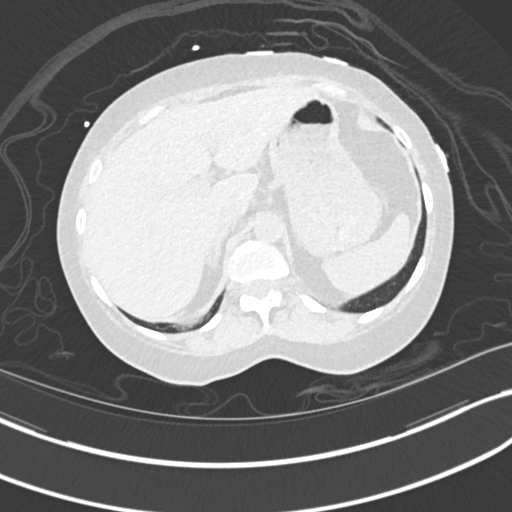
[im 31/131  lung]
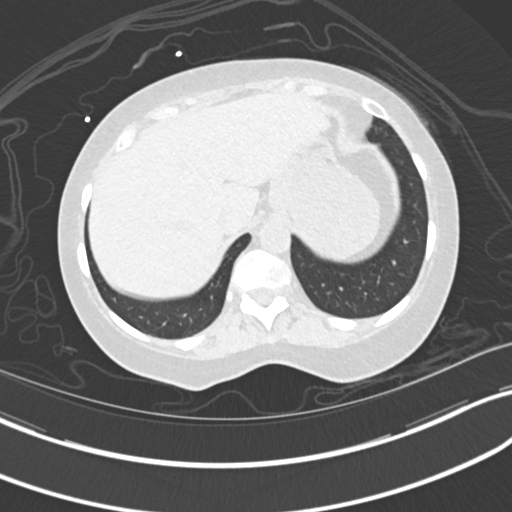
[im 41/131  lung]
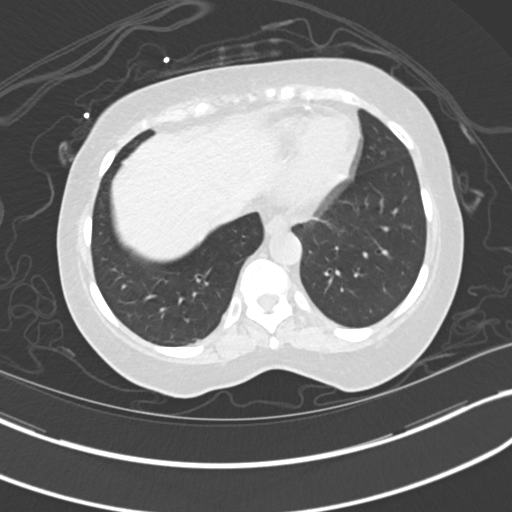
[im 51/131  mediastinal]
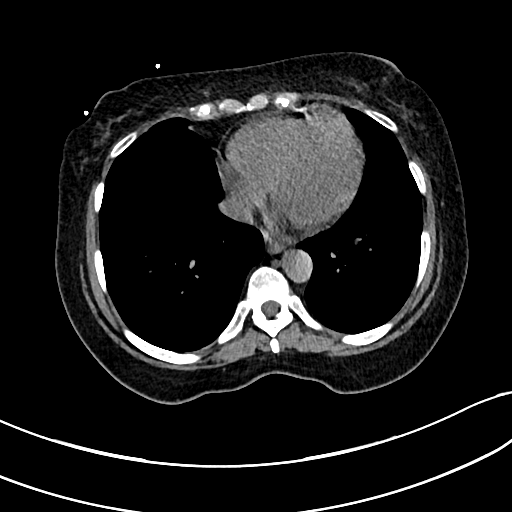
[im 51/131  lung]
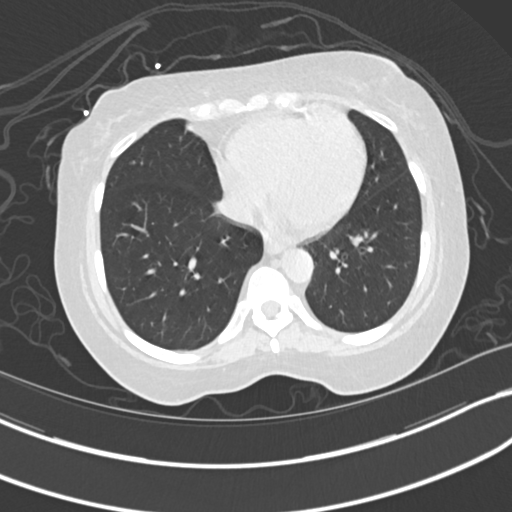
[im 61/131  lung]
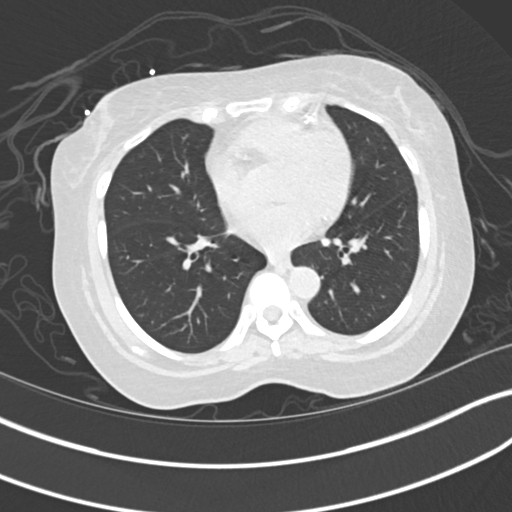
[im 71/131  lung]
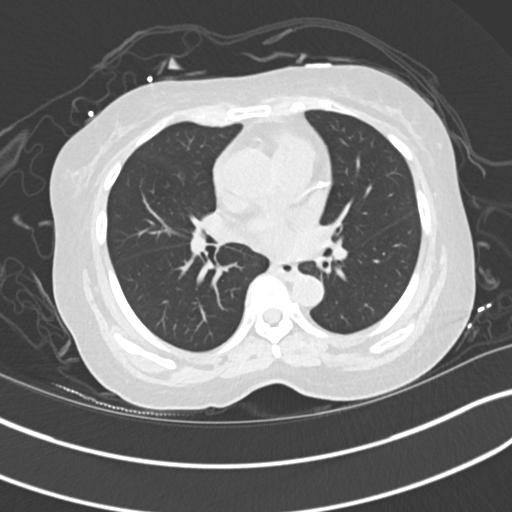
[im 81/131  lung]
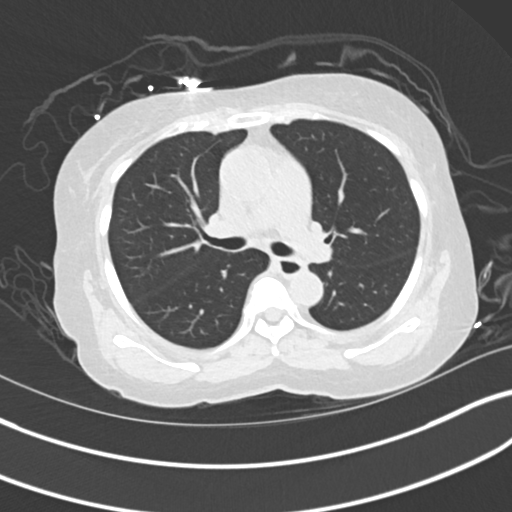
[im 91/131  mediastinal]
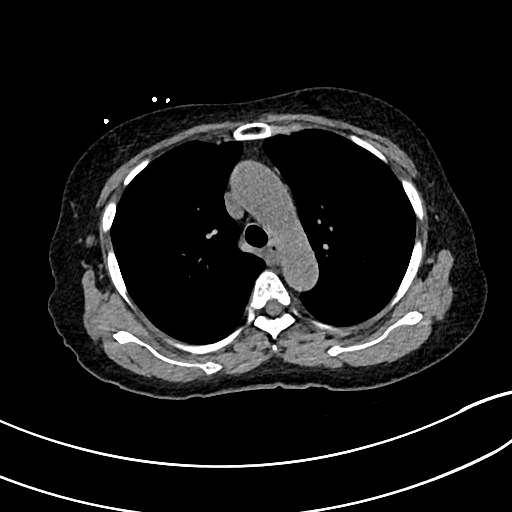
[im 91/131  lung]
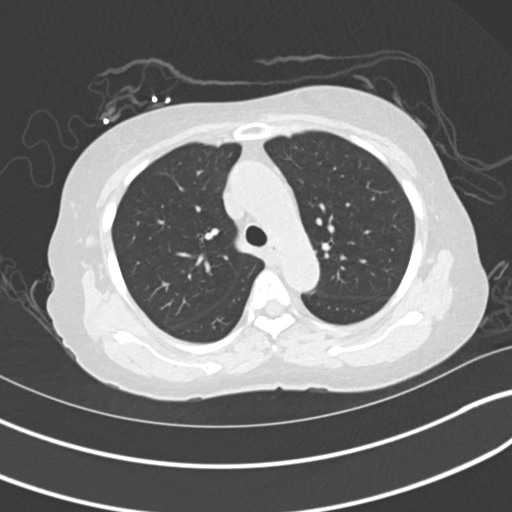
[im 101/131  lung]
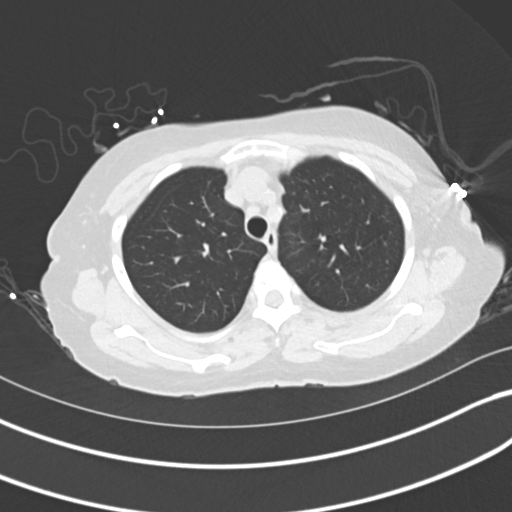
[im 111/131  lung]
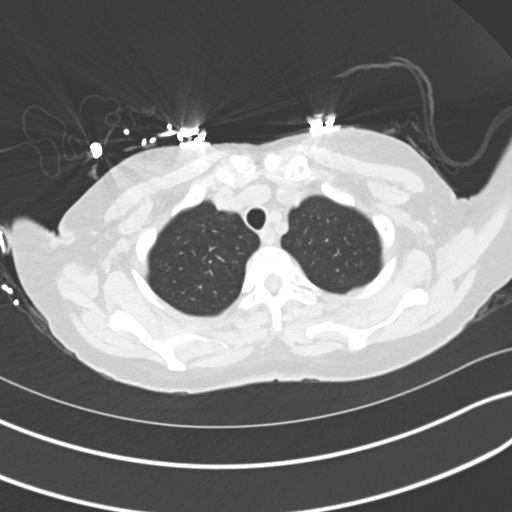
[im 121/131  lung]
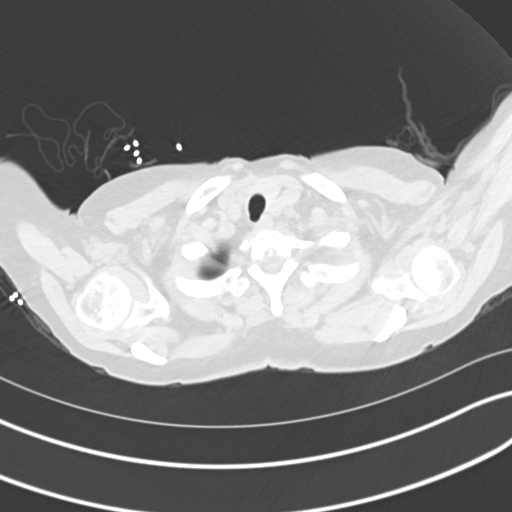

[Series 6: cor · coronal · 0.59mm/px · 3 of 125 slices shown]
[im 25/125  lung]
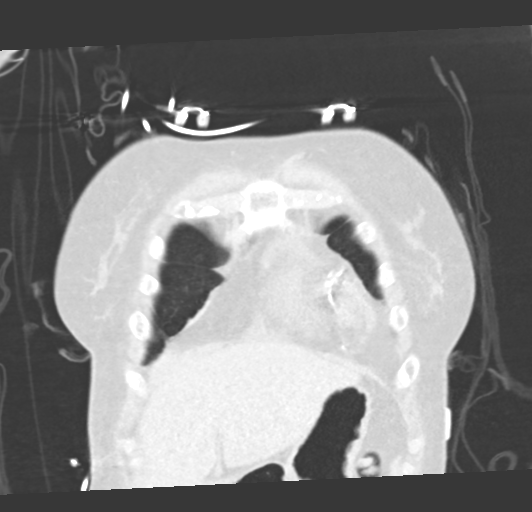
[im 50/125  lung]
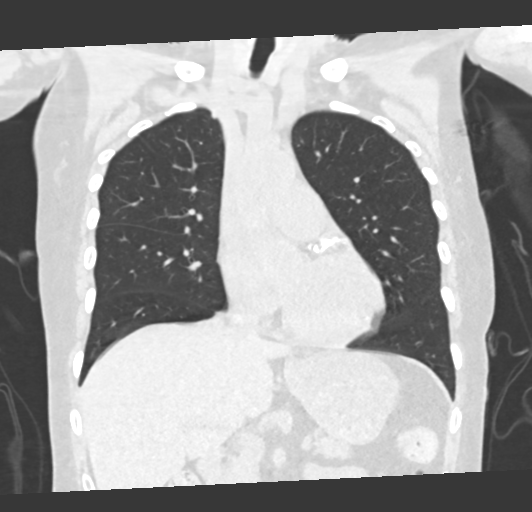
[im 75/125  lung]
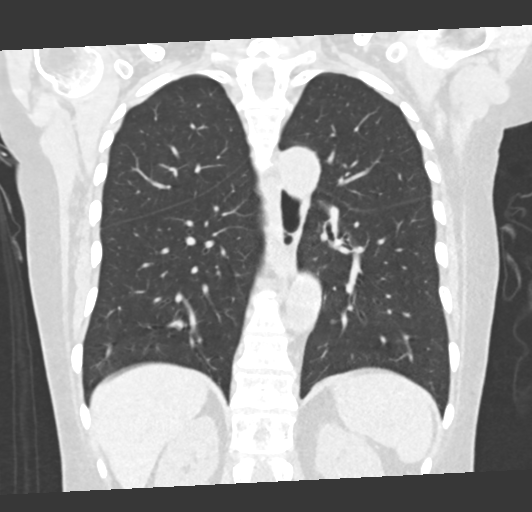

[15 of 36 positions shown; findings below may reference images not displayed]

FINDINGS: Cardiovascular: Normal cardiac size. Trace pericardial effusion.
Three-vessel coronary artery atherosclerosis. Atherosclerotic plaque
within the normal caliber aorta. No hyperdense mural thickening or
plaque displacement to suggest intramural hematoma. No periaortic
stranding or hemorrhage. Central pulmonary arteries are normal
caliber. Luminal evaluation precluded in the absence of contrast
media. No major venous abnormalities are evident.

Mediastinum/Nodes: No mediastinal fluid or gas. Normal thyroid gland
and thoracic inlet. No acute abnormality of the trachea or
esophagus. No worrisome mediastinal or axillary adenopathy. Hilar
nodal evaluation is limited in the absence of intravenous contrast
media.

Lungs/Pleura: No consolidation, features of edema, pneumothorax, or
effusion. No suspicious pulmonary nodules or masses. Few scattered
tiny calcified granulomata.

Upper Abdomen: No acute abnormalities present in the visualized
portions of the upper abdomen.

Musculoskeletal: Multilevel degenerative changes are present in the
imaged portions of the spine. No acute osseous abnormality or
suspicious osseous lesion. No worrisome chest wall masses or
lesions.
IMPRESSION: 1. No acute intrathoracic process to provide a cause for patient's
symptoms.
2. Sequela of prior granulomatous disease with few scattered
calcified granulomata in the lungs.
3. Three-vessel coronary artery atherosclerosis.
4. Aortic Atherosclerosis ([id number]).

## 2022-02-15 ENCOUNTER — Other Ambulatory Visit: Payer: Self-pay

## 2022-02-15 DIAGNOSIS — E1165 Type 2 diabetes mellitus with hyperglycemia: Secondary | ICD-10-CM

## 2022-02-15 MED ORDER — FREESTYLE LIBRE 2 SENSOR MISC: 0 refills | Status: DC

## 2022-02-16 IMAGING — CR DG CHEST 2V
2 series · 2 of 2 positions shown · non-contrast
Comparison: Chest CT without contrast 11/29/2020 and earlier.

CLINICAL DATA: 74-year-old female preoperative chest.

EXAM:
CHEST - 2 VIEW

[chest pa]
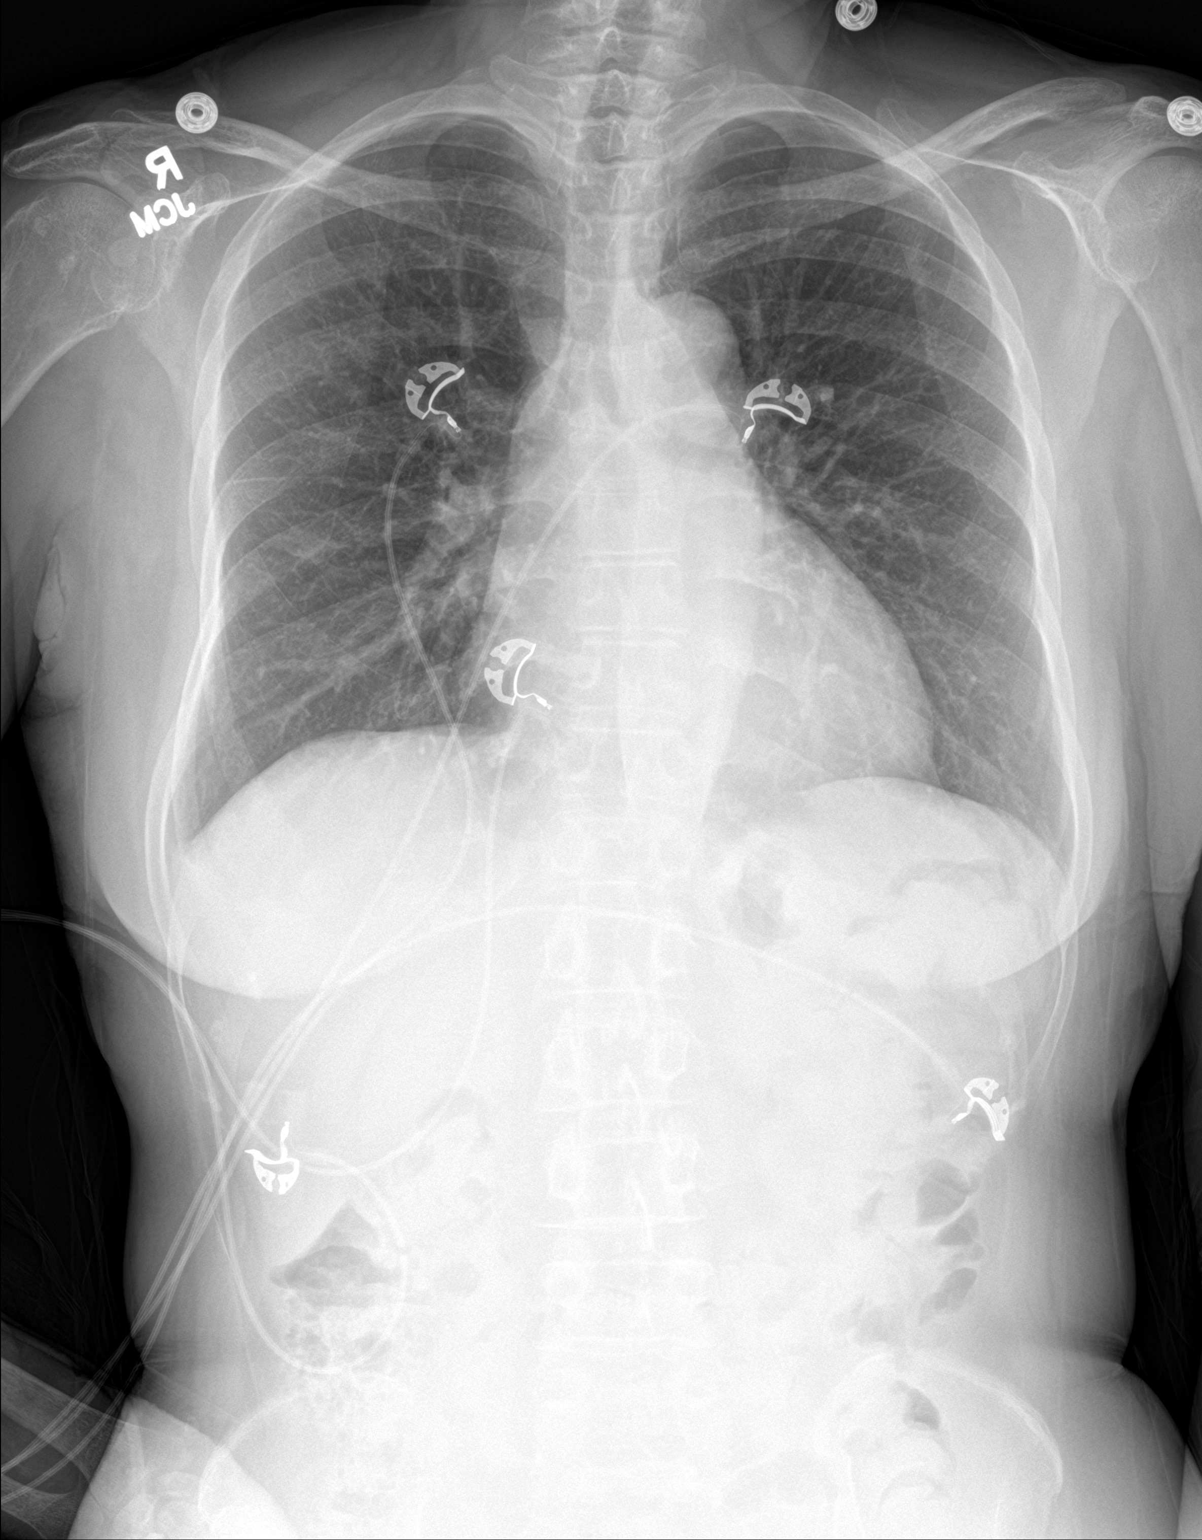

[chest lat]
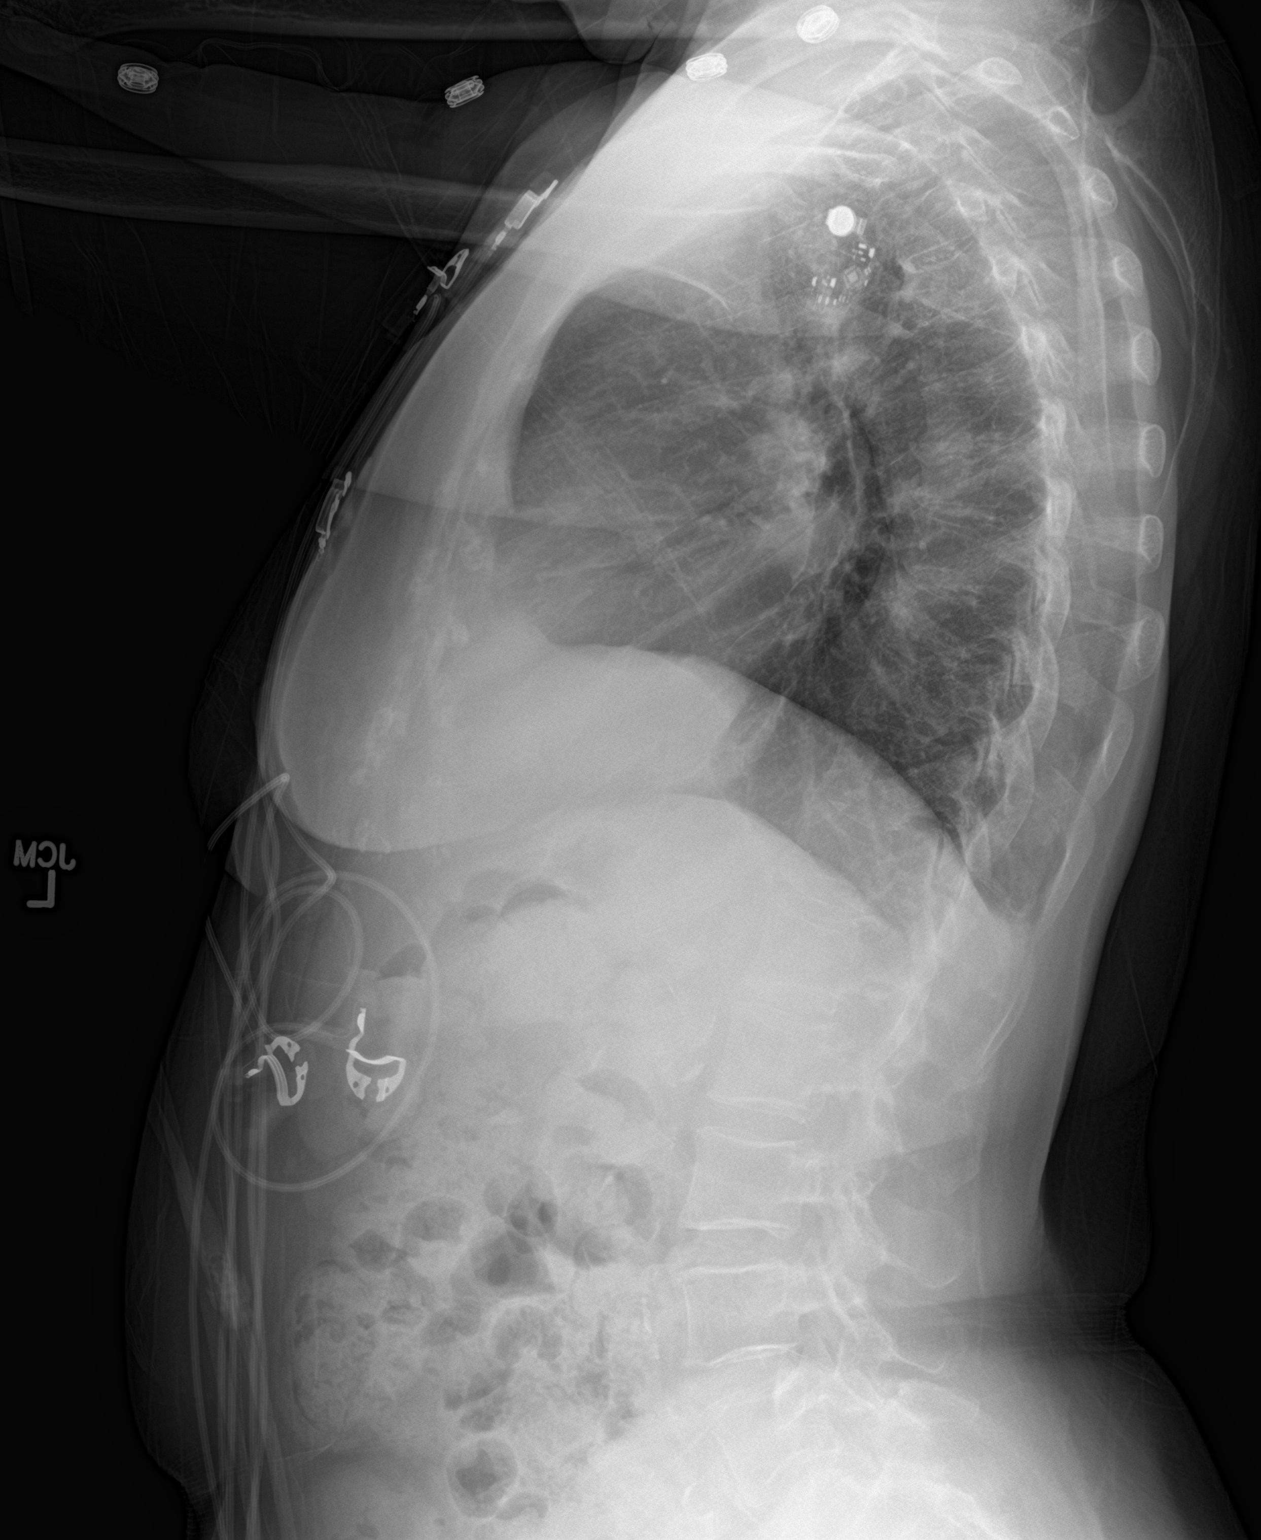

[2 of 2 positions shown; findings below may reference images not displayed]

FINDINGS: Lung volumes and mediastinal contours remain normal. Visualized
tracheal air column is within normal limits. Both lungs appear
clear. No pneumothorax or pleural effusion.

Nonobstructed visible bowel gas pattern with somewhat abundant
retained stool. Abdomen Calcified aortic atherosclerosis. No acute
osseous abnormality identified.
IMPRESSION: No acute cardiopulmonary abnormality.

Aortic atherosclerosis.

## 2022-02-19 ENCOUNTER — Encounter: Payer: Self-pay | Admitting: Endocrinology

## 2022-02-21 ENCOUNTER — Telehealth: Payer: Self-pay

## 2022-02-21 NOTE — Telephone Encounter (Signed)
Patient assistance ozempic arrived. 5 boxes received. Tried calling patient she wasn't available left message to call us back  ?

## 2022-02-22 NOTE — Telephone Encounter (Signed)
Daughter picked up Ozempic  ?

## 2022-02-22 NOTE — Telephone Encounter (Signed)
Patient called back and informed that patient assistance is ready for pickup. ?

## 2022-02-23 ENCOUNTER — Other Ambulatory Visit: Payer: Self-pay | Admitting: Urology

## 2022-02-23 DIAGNOSIS — D4102 Neoplasm of uncertain behavior of left kidney: Secondary | ICD-10-CM

## 2022-02-23 LAB — COMPREHENSIVE METABOLIC PANEL
Calcium: 9.5 (ref 8.7–10.7)
eGFR: 31

## 2022-02-23 LAB — VITAMIN D 25 HYDROXY (VIT D DEFICIENCY, FRACTURES): Vit D, 25-Hydroxy: 14.3

## 2022-02-23 LAB — CBC AND DIFFERENTIAL: Hemoglobin: 10.2 — AB (ref 12.0–16.0)

## 2022-02-23 LAB — BASIC METABOLIC PANEL
BUN: 39 — AB (ref 4–21)
Creatinine: 1.7 — AB (ref 0.5–1.1)
Glucose: 134
Potassium: 4.4 mEq/L (ref 3.5–5.1)
Sodium: 143 (ref 137–147)

## 2022-03-06 ENCOUNTER — Encounter: Payer: Self-pay | Admitting: Internal Medicine

## 2022-03-16 ENCOUNTER — Ambulatory Visit
Admission: RE | Admit: 2022-03-16 | Discharge: 2022-03-16 | Disposition: A | Discharge status: Home / Self Care | Payer: Medicare Other | Source: Ambulatory Visit | Attending: Urology | Admitting: Urology

## 2022-03-16 DIAGNOSIS — D4102 Neoplasm of uncertain behavior of left kidney: Secondary | ICD-10-CM

## 2022-03-16 MED ORDER — GADOBENATE DIMEGLUMINE 529 MG/ML IV SOLN
10.0000 mL | Freq: Once | INTRAVENOUS | Status: AC | PRN
  Administered 2022-03-16: 10 mL via INTRAVENOUS

## 2022-03-17 ENCOUNTER — Other Ambulatory Visit: Payer: Self-pay | Admitting: Endocrinology

## 2022-03-17 DIAGNOSIS — E1165 Type 2 diabetes mellitus with hyperglycemia: Secondary | ICD-10-CM

## 2022-04-20 ENCOUNTER — Other Ambulatory Visit: Payer: Self-pay | Admitting: Endocrinology

## 2022-04-20 DIAGNOSIS — E1165 Type 2 diabetes mellitus with hyperglycemia: Secondary | ICD-10-CM

## 2022-04-26 ENCOUNTER — Other Ambulatory Visit: Payer: Self-pay | Admitting: Endocrinology

## 2022-05-03 ENCOUNTER — Other Ambulatory Visit: Payer: Self-pay | Admitting: Endocrinology

## 2022-05-03 DIAGNOSIS — E1165 Type 2 diabetes mellitus with hyperglycemia: Secondary | ICD-10-CM

## 2022-05-04 ENCOUNTER — Other Ambulatory Visit (INDEPENDENT_AMBULATORY_CARE_PROVIDER_SITE_OTHER): Payer: Medicare Other

## 2022-05-04 DIAGNOSIS — E1165 Type 2 diabetes mellitus with hyperglycemia: Secondary | ICD-10-CM

## 2022-05-04 DIAGNOSIS — Z794 Long term (current) use of insulin: Secondary | ICD-10-CM | POA: Diagnosis not present

## 2022-05-04 LAB — HEMOGLOBIN A1C: Hgb A1c MFr Bld: 7.9 % — ABNORMAL HIGH (ref 4.6–6.5)

## 2022-05-04 LAB — BASIC METABOLIC PANEL
BUN: 36 mg/dL — ABNORMAL HIGH (ref 6–23)
CO2: 26 mEq/L (ref 19–32)
Calcium: 10.7 mg/dL — ABNORMAL HIGH (ref 8.4–10.5)
Chloride: 104 mEq/L (ref 96–112)
Creatinine, Ser: 1.68 mg/dL — ABNORMAL HIGH (ref 0.40–1.20)
GFR: 29.65 mL/min — ABNORMAL LOW (ref >60.00)
Glucose, Bld: 154 mg/dL — ABNORMAL HIGH (ref 70–99)
Potassium: 4.4 mEq/L (ref 3.5–5.1)
Sodium: 137 mEq/L (ref 135–145)

## 2022-05-05 LAB — FRUCTOSAMINE: Fructosamine: 346 umol/L — ABNORMAL HIGH (ref 0–285)

## 2022-05-06 ENCOUNTER — Other Ambulatory Visit: Payer: Self-pay | Admitting: Endocrinology

## 2022-05-08 ENCOUNTER — Ambulatory Visit (INDEPENDENT_AMBULATORY_CARE_PROVIDER_SITE_OTHER): Payer: Medicare Other | Admitting: Endocrinology

## 2022-05-08 ENCOUNTER — Encounter: Payer: Self-pay | Admitting: Endocrinology

## 2022-05-08 ENCOUNTER — Other Ambulatory Visit: Payer: Self-pay | Admitting: Endocrinology

## 2022-05-08 VITALS — BP 168/92 | HR 75 | Ht <= 58 in | Wt 101.8 lb

## 2022-05-08 DIAGNOSIS — Z794 Long term (current) use of insulin: Secondary | ICD-10-CM | POA: Diagnosis not present

## 2022-05-08 DIAGNOSIS — I2510 Atherosclerotic heart disease of native coronary artery without angina pectoris: Secondary | ICD-10-CM

## 2022-05-08 DIAGNOSIS — E1165 Type 2 diabetes mellitus with hyperglycemia: Secondary | ICD-10-CM | POA: Diagnosis not present

## 2022-05-08 DIAGNOSIS — N183 Chronic kidney disease, stage 3 unspecified: Secondary | ICD-10-CM | POA: Diagnosis not present

## 2022-05-08 DIAGNOSIS — E7800 Pure hypercholesterolemia, unspecified: Secondary | ICD-10-CM | POA: Diagnosis not present

## 2022-05-08 DIAGNOSIS — I10 Essential (primary) hypertension: Secondary | ICD-10-CM | POA: Diagnosis not present

## 2022-05-08 MED ORDER — GABAPENTIN 100 MG PO CAPS: 100.0000 mg | ORAL_CAPSULE | Freq: Three times a day (TID) | ORAL | 3 refills | Status: DC

## 2022-05-08 MED ORDER — CLONIDINE HCL 0.1 MG PO TABS: 0.1000 mg | ORAL_TABLET | Freq: Two times a day (BID) | ORAL | 5 refills | Status: DC

## 2022-05-08 MED ORDER — NOVOLOG FLEXPEN 100 UNIT/ML ~~LOC~~ SOPN: PEN_INJECTOR | SUBCUTANEOUS | 3 refills | Status: DC

## 2022-05-08 NOTE — Progress Notes (Signed)
Patient ID: Mariah Peterson, female   DOB: 1946/09/07, 76 y.o.   MRN: [id number]          Reason for Appointment: Follow-up    History of Present Illness:          Date of diagnosis of type 2 diabetes mellitus:?  1990       Background history:   She was started on insulin about 4 years ago and previously taking metformin and glipizide No detailed history is available and she does not think she has taken any other types of diabetes medications or injections  Recent history:    INSULIN regimen is: Lantus 14 units daily in am, Novolog 10 units at breakfast, 8 units at lunch and supper   Non-insulin hypoglycemic drugs the patient is taking are:   Ozempic 0.25 mg weekly,  Current management, blood sugar patterns and problems identified:  Her A1c is 7.9 compared to 7  Last fructosamine 337  Interpretation of her freestyle libre download between 7/11 and today as follows: Her freestyle Elenor Legato data is somewhat incomplete with only 32% active CGM time  Also blood sugars are very inconsistent  She has had sporadic HYPERGLYCEMIA in the mornings after breakfast, at least once after dinner but no other consistent patterns  Occasionally may have relatively low sugar around dinnertime preceded by a high reading  Overnight blood sugars are generally well controlled with only once low normal reading and no hypoglycemia  AVERAGE overall 158 with 68% within TARGET range  In the last month she was able to get back on her Ozempic  Previously had done well with this but had run out because of difficulty with patient assistance  Although she thinks she is eating well and this has not reduced her appetite her weight appears to be down significantly Not clear if this is related to previous edema She was told to reduce her Lantus by 2 units but she forgot  Also she thinks that she has much higher blood sugars when she is eating cereal in the morning which has happened a couple of times and she  thinks she can switch back to eating grits with either eggs or bacon  Again she forgets to check her sugars with her Elenor Legato and most of her monitoring is only once or twice a day She thinks she has not forgotten to take her insulin before meals   Breakfast usually 8-9 am, lunch 1 PM, supper 6 PM    Glucose sensor: Freestyle libre   Previous data:  CGM use % of time 59  2-week average/GV 167  Time in range     64, was 46   %  % Time Above 180 28+7  % Time above 250   % Time Below 70 1     PRE-MEAL Fasting Lunch Dinner Bedtime Overall  Glucose range:       Averages: 139   196    POST-MEAL PC Breakfast PC Lunch PC Dinner  Glucose range:     Averages: 207 204 218     She was seen by diabetes educator in 9/19       Side effects from medications have been: None  Compliance with the medical regimen: Fair  Typical meal intake: Breakfast is oatmeal, otherwise eggs and toast.  Lunch may be rice and beans or a sandwich at 4 pm Fruit for snacks                Dietician visit, most recent:  09/2018 CDE consultation: 07/01/2018  Weight history:  Wt Readings from Last 3 Encounters:  05/08/22 101 lb 12.8 oz (46.2 kg)  02/08/22 115 lb 4.8 oz (52.3 kg)  01/31/22 116 lb 3.2 oz (52.7 kg)    Glycemic control:   Lab Results  Component Value Date   HGBA1C 7.9 (H) 05/04/2022   HGBA1C 7.0 (A) 01/31/2022   HGBA1C 9.9 (H) 11/28/2021   Lab Results  Component Value Date   MICROALBUR 16.1 (H) 11/28/2021   LDLCALC 56 05/02/2021   CREATININE 1.68 (H) 05/04/2022   Lab Results  Component Value Date   MICRALBCREAT 51.5 (H) 11/28/2021    Lab Results  Component Value Date   FRUCTOSAMINE 346 (H) 05/04/2022   FRUCTOSAMINE 337 (H) 01/23/2022   FRUCTOSAMINE 435 (H) 09/25/2021    Lab on 05/04/2022  Component Date Value Ref Range Status   Hgb A1c MFr Bld 05/04/2022 7.9 (H)  4.6 - 6.5 % Final   Glycemic Control Guidelines for People with Diabetes:Non Diabetic:  <6%Goal of Therapy:  <7%Additional Action Suggested:  >8%    Sodium 05/04/2022 137  135 - 145 mEq/L Final   Potassium 05/04/2022 4.4  3.5 - 5.1 mEq/L Final   Chloride 05/04/2022 104  96 - 112 mEq/L Final   CO2 05/04/2022 26  19 - 32 mEq/L Final   Glucose, Bld 05/04/2022 154 (H)  70 - 99 mg/dL Final   BUN 05/04/2022 36 (H)  6 - 23 mg/dL Final   Creatinine, Ser 05/04/2022 1.68 (H)  0.40 - 1.20 mg/dL Final   GFR 05/04/2022 29.65 (L)  >60.00 mL/min Final   Calculated using the CKD-EPI Creatinine Equation (2021)   Calcium 05/04/2022 10.7 (H)  8.4 - 10.5 mg/dL Final   Fructosamine 05/04/2022 346 (H)  0 - 285 umol/L Final   Comment: Published reference interval for apparently healthy subjects between age 67 and 29 is 41 - 285 umol/L and in a poorly controlled diabetic population is 228 - 563 umol/L with a mean of 396 umol/L.     Allergies as of 05/08/2022   No Known Allergies      Medication List        Accurate as of May 08, 2022 10:54 AM. If you have any questions, ask your nurse or doctor.          STOP taking these medications    pregabalin 25 MG capsule Commonly known as: Lyrica Stopped by: Elayne Snare, MD       TAKE these medications    amLODipine 5 MG tablet Commonly known as: NORVASC Take 1 tablet (5 mg total) by mouth daily.   aspirin EC 81 MG tablet Take 1 tablet (81 mg total) by mouth daily. Swallow whole.   calcium carbonate 1500 (600 Ca) MG Tabs tablet Commonly known as: OSCAL Take 1 tablet by mouth 2 (two) times daily with a meal.   cloNIDine 0.1 MG tablet Commonly known as: CATAPRES Take 1 tablet (0.1 mg total) by mouth 2 (two) times daily.   ezetimibe 10 MG tablet Commonly known as: ZETIA Take 1 tablet by mouth once daily   FreeStyle Libre 2 Sensor Misc CHANGE EVERY 14 DAYS   furosemide 20 MG tablet Commonly known as: LASIX Take 1 tablet by mouth once daily   gabapentin 100 MG capsule Commonly known as: NEURONTIN Take 1 capsule (100 mg total) by mouth 3  (three) times daily. Started by: Elayne Snare, MD   glucose blood test strip Use as instructed to test  blood sugar 3 times daily E11.65   Lantus SoloStar 100 UNIT/ML Solostar Pen Generic drug: insulin glargine Inject 16 Units into the skin daily. INJECT 16 UNITS UNDER THE SKIN ONCE DAILY. (UPDATED DOSAGE)   losartan 100 MG tablet Commonly known as: COZAAR Take 1 tablet (100 mg total) by mouth daily.   metoprolol tartrate 25 MG tablet Commonly known as: LOPRESSOR Take 1 tablet (25 mg total) by mouth 2 (two) times daily.   NovoLOG FlexPen 100 UNIT/ML FlexPen Generic drug: insulin aspart 6 UNITS BEFORE Breakfast on exercise days and if eating eggs 8 Units on other days LUNCH COVERAGE: NOVOLOG 8 UNITS if eating at home and 10 units for fast food Dinner 6 units Novolog before eating   Ozempic (0.25 or 0.5 MG/DOSE) 2 MG/1.5ML Sopn Generic drug: Semaglutide(0.25 or 0.'5MG'$ /DOS) Inject 0.5 mg into the skin once a week. 0.'25mg'$  weekly for 4 weeks then 0.'5mg'$  weekly   pioglitazone 15 MG tablet Commonly known as: ACTOS Take 1 tablet by mouth once daily   rosuvastatin 20 MG tablet Commonly known as: CRESTOR Take 1 tablet by mouth once daily        Allergies: No Known Allergies  Past Medical History:  Diagnosis Date   Chest pain 11/30/2020   Coronary artery disease    Diabetes mellitus without complication (HCC)    Glaucoma    History of chicken pox    Hypertension     Past Surgical History:  Procedure Laterality Date   BREAST BIOPSY Right 2017   benign   CARDIAC CATHETERIZATION     CATARACT EXTRACTION, BILATERAL Bilateral    CESAREAN SECTION     COLONOSCOPY  1980's   CORONARY ARTERY BYPASS GRAFT N/A 12/05/2020   Procedure: CORONARY ARTERY BYPASS GRAFTING (CABG), ON PUMP, TIMES FOUR, USING LEFT INTERNAL MAMMARY ARTERY AND BILATERAL ENDOSCOPICALLY HARVESTED GREATER SAPHENOUS VEINS;  Surgeon: [name], MD;  Location: [facility];  Service: Open Heart Surgery;  Laterality:  N/A;   LEFT HEART CATH AND CORONARY ANGIOGRAPHY N/A 11/30/2020   Procedure: LEFT HEART CATH AND CORONARY ANGIOGRAPHY;  Surgeon: Belva Crome, MD;  Location: [facility];  Service: Cardiovascular;  Laterality: N/A;   TEE WITHOUT CARDIOVERSION N/A 12/05/2020   Procedure: TRANSESOPHAGEAL ECHOCARDIOGRAM (TEE);  Surgeon: [name], MD;  Location: [facility];  Service: Open Heart Surgery;  Laterality: N/A;   TUBAL LIGATION      Family History  Problem Relation Age of Onset   Hypertension Mother    Diabetes Brother    CAD Brother    Hypertension Brother    Diabetes Brother    Hypertension Brother    Colon cancer Neg Hx    Esophageal cancer Neg Hx    Pancreatic cancer Neg Hx    Liver disease Neg Hx    Stomach cancer Neg Hx    Rectal cancer Neg Hx    Colon polyps Neg Hx     Social History:  reports that she has never smoked. She has never used smokeless tobacco. She reports that she does not drink alcohol and does not use drugs.   Review of Systems   Lipid history: Currently on Crestor 20 mg Previously had not been taking it regularly with LDL as high as 226 She is also on Zetia  LDL recently below 70  Lab Results  Component Value Date   CHOL 128 05/02/2021   CHOL 163 03/03/2021   CHOL 302 (H) 11/30/2020   Lab Results  Component Value Date  HDL 58.60 05/02/2021   HDL 66.80 03/03/2021   HDL 54 11/30/2020   Lab Results  Component Value Date   LDLCALC 56 05/02/2021   LDLCALC 77 03/03/2021   LDLCALC 226 (H) 11/30/2020   Lab Results  Component Value Date   TRIG 64.0 05/02/2021   TRIG 97.0 03/03/2021   TRIG 108 11/30/2020   Lab Results  Component Value Date   CHOLHDL 2 05/02/2021   CHOLHDL 2 03/03/2021   CHOLHDL 5.6 11/30/2020   Lab Results  Component Value Date   LDLDIRECT 33.0 01/23/2022            Hypertension: Has been present for several years, previously on amlodipine and HCTZ  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 She is taking  amlodipine and losartan 100 mg  She her blood pressure was 140 at home   BP Readings from Last 3 Encounters:  05/08/22 (!) 168/92  02/08/22 (!) 130/56  01/31/22 (!) 174/86   CKD: Her nephrologist saw her in 1/23 and recommended Farxiga but cannot afford any SGLT2 drugs Etiology apparently diabetes and hypertension  No history of microalbuminuria  She was given Lasix for her edema previously  Lab Results  Component Value Date   CREATININE 1.68 (H) 05/04/2022   CREATININE 1.7 (A) 02/23/2022   CREATININE 1.53 (H) 01/23/2022    Most recent eye exam was in 9/22  Most recent foot exam: 10/22  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  LABS:  Lab on 05/04/2022  Component Date Value Ref Range Status   Hgb A1c MFr Bld 05/04/2022 7.9 (H)  4.6 - 6.5 % Final   Glycemic Control Guidelines for People with Diabetes:Non Diabetic:  <6%Goal of Therapy: <7%Additional Action Suggested:  >8%    Sodium 05/04/2022 137  135 - 145 mEq/L Final   Potassium 05/04/2022 4.4  3.5 - 5.1 mEq/L Final   Chloride 05/04/2022 104  96 - 112 mEq/L Final   CO2 05/04/2022 26  19 - 32 mEq/L Final   Glucose, Bld 05/04/2022 154 (H)  70 - 99 mg/dL Final   BUN 05/04/2022 36 (H)  6 - 23 mg/dL Final   Creatinine, Ser 05/04/2022 1.68 (H)  0.40 - 1.20 mg/dL Final   GFR 05/04/2022 29.65 (L)  >60.00 mL/min Final   Calculated using the CKD-EPI Creatinine Equation (2021)   Calcium 05/04/2022 10.7 (H)  8.4 - 10.5 mg/dL Final   Fructosamine 05/04/2022 346 (H)  0 - 285 umol/L Final   Comment: Published reference interval for apparently healthy subjects between age 33 and 79 is 46 - 285 umol/L and in a poorly controlled diabetic population is 228 - 563 umol/L with a mean of 396 umol/L.     Physical Examination:  BP (!) 168/92 (BP Location: Left Arm, Patient Position: Sitting, Cuff Size: Normal)   Pulse 75   Ht 4' 8.25" (1.429 m)   Wt 101 lb 12.8 oz (46.2 kg)   SpO2 96%   BMI 22.62 kg/m     ASSESSMENT:  Diabetes type 2, insulin-dependent    A1c is 7 compared to 9.9 and previously persistently high  See history of present illness for discussion of current diabetes management, blood sugar patterns and problems identified  She is on basal bolus insulin, Actos 15 mg  She is doing better with Ozempic, currently taking this with a sample but cannot afford it and still waiting for patient assistance program Not able to explain why her blood sugars are so dramatically improved  but she is also appearing to get hypoglycemia after breakfast when she is taking 10 units despite getting some protein consistently  HYPERTENSION: Blood pressure is again high but she thinks her blood pressure was better when she was taking clonidine which she ran out a week ago Also likely has much less edema now as judged by her weight   Renal dysfunction: Again variable and followed by nephrology  NEUROPATHY: She has been complaining of burning in her feet especially at night, she thinks she previously had benefited from gabapentin Discussed that this is likely to be from episodes of hyperglycemia  PLAN:   Continue Ozempic unchanged For now will not change her insulin regimen but discussed that she needs to reduce her lunchtime dose to 6 units for larger meals Also if she has a tendency to low sugars after breakfast she can reduce her NovoLog by 2 units We will continue the Lantus unchanged for now Reminded her to check her blood sugar 4 times a day with her sensor including after dinner to help identify her patterns better If she does have cereal in the mornings he will need to take 14 NovoLog instead of 10  For hypertension she will go back to clonidine and not run out She will also continue to follow-up with her other specialists including cardiologist  Restart gabapentin 100 mg at bedtime  Patient Instructions  Reduce lunch dose to 6 if eating light meals or less Carbs  Call if getting  lows  Check sugar 4x daily     Elayne Snare 05/08/2022, 10:54 AM   Note: This office note was prepared with Dragon voice recognition system technology. Any transcriptional errors that result from this process are unintentional.

## 2022-05-08 NOTE — Patient Instructions (Addendum)
Reduce lunch dose to 6 if eating light meals or less Carbs  Call if getting lows  Check sugar 4x daily

## 2022-05-11 ENCOUNTER — Encounter: Payer: Self-pay | Admitting: Endocrinology

## 2022-05-17 ENCOUNTER — Ambulatory Visit (INDEPENDENT_AMBULATORY_CARE_PROVIDER_SITE_OTHER): Payer: Medicare Other | Admitting: Cardiology

## 2022-05-17 ENCOUNTER — Encounter: Payer: Self-pay | Admitting: Cardiology

## 2022-05-17 VITALS — BP 138/66 | HR 65 | Ht <= 58 in | Wt 101.8 lb

## 2022-05-17 DIAGNOSIS — I2510 Atherosclerotic heart disease of native coronary artery without angina pectoris: Secondary | ICD-10-CM

## 2022-05-17 DIAGNOSIS — I10 Essential (primary) hypertension: Secondary | ICD-10-CM

## 2022-05-17 DIAGNOSIS — E0800 Diabetes mellitus due to underlying condition with hyperosmolarity without nonketotic hyperglycemic-hyperosmolar coma (NKHHC): Secondary | ICD-10-CM

## 2022-05-17 DIAGNOSIS — Z951 Presence of aortocoronary bypass graft: Secondary | ICD-10-CM

## 2022-05-17 DIAGNOSIS — E782 Mixed hyperlipidemia: Secondary | ICD-10-CM | POA: Diagnosis not present

## 2022-05-17 NOTE — Patient Instructions (Signed)

## 2022-05-17 NOTE — Progress Notes (Signed)
Cardiology Office Note:    Date:  05/17/2022   ID:  Mariah Peterson, DOB July 06, 1946, MRN [id number]  PCP:  Elayne Snare, MD   [facility] Providers Cardiologist:  Candee Furbish, MD     Referring MD: Isaac Bliss, [place]*    History of Present Illness:    Mariah Peterson is a 76 y.o. female here for the follow-up of coronary disease post CABG with non-ST elevation myocardial infarction on 12/05/2020, Dr. Kipp Brood.  Brother had CABG.  Has nephrologist. [id number]  Dr. Dwyane Dee - DM  Urologist as well.  Overall been doing quite well no fevers chills nausea vomiting syncope bleeding.  No anginal symptoms.  Occasionally she will have some discomfort over her vein harvest site in her right lower extremity.  Explained massaging the scar may help.  Past Medical History:  Diagnosis Date   Chest pain 11/30/2020   Coronary artery disease    Diabetes mellitus without complication (HCC)    Glaucoma    History of chicken pox    Hypertension     Past Surgical History:  Procedure Laterality Date   BREAST BIOPSY Right 2017   benign   CARDIAC CATHETERIZATION     CATARACT EXTRACTION, BILATERAL Bilateral    CESAREAN SECTION     COLONOSCOPY  1980's   CORONARY ARTERY BYPASS GRAFT N/A 12/05/2020   Procedure: CORONARY ARTERY BYPASS GRAFTING (CABG), ON PUMP, TIMES FOUR, USING LEFT INTERNAL MAMMARY ARTERY AND BILATERAL ENDOSCOPICALLY HARVESTED GREATER SAPHENOUS VEINS;  Surgeon: [name], MD;  Location: [place];  Service: Open Heart Surgery;  Laterality: N/A;   LEFT HEART CATH AND CORONARY ANGIOGRAPHY N/A 11/30/2020   Procedure: LEFT HEART CATH AND CORONARY ANGIOGRAPHY;  Surgeon: Belva Crome, MD;  Location: [place] [facility];  Service: Cardiovascular;  Laterality: N/A;   TEE WITHOUT CARDIOVERSION N/A 12/05/2020   Procedure: TRANSESOPHAGEAL ECHOCARDIOGRAM (TEE);  Surgeon: [name], MD;  Location: [place];  Service: Open Heart Surgery;  Laterality: N/A;   TUBAL LIGATION       Current Medications: Current Meds  Medication Sig   amLODipine (NORVASC) 5 MG tablet Take 1 tablet (5 mg total) by mouth daily.   aspirin EC 81 MG tablet Take 1 tablet (81 mg total) by mouth daily. Swallow whole.   calcium carbonate (OSCAL) 1500 (600 Ca) MG TABS tablet Take 1 tablet by mouth 2 (two) times daily with a meal.   cloNIDine (CATAPRES) 0.1 MG tablet Take 1 tablet (0.1 mg total) by mouth 2 (two) times daily.   Continuous Blood Gluc Sensor (FREESTYLE LIBRE 2 SENSOR) MISC CHANGE EVERY 14 DAYS   ezetimibe (ZETIA) 10 MG tablet Take 1 tablet by mouth once daily   furosemide (LASIX) 20 MG tablet Take 1 tablet by mouth once daily   gabapentin (NEURONTIN) 100 MG capsule Take 1 capsule (100 mg total) by mouth 3 (three) times daily.   glucose blood test strip Use as instructed to test blood sugar 3 times daily E11.65   insulin aspart (NOVOLOG FLEXPEN) 100 UNIT/ML FlexPen 6 UNITS BEFORE Breakfast on exercise days and if eating eggs 8 Units on other days LUNCH COVERAGE: NOVOLOG 8 UNITS if eating at home and 10 units for fast food Dinner 6 units Novolog before eating   insulin glargine-yfgn (SEMGLEE) 100 UNIT/ML Pen INJECT 16 UNITS SUBCUTANEOUSLY ONCE DAILY   losartan (COZAAR) 100 MG tablet Take 1 tablet (100 mg total) by mouth daily.   metoprolol tartrate (LOPRESSOR) 25 MG tablet Take 1 tablet (  25 mg total) by mouth 2 (two) times daily.   pioglitazone (ACTOS) 15 MG tablet Take 1 tablet by mouth once daily   rosuvastatin (CRESTOR) 20 MG tablet Take 1 tablet by mouth once daily   Semaglutide,0.25 or 0.'5MG'$ /DOS, (OZEMPIC, 0.25 OR 0.5 MG/DOSE,) 2 MG/1.5ML SOPN Inject 0.5 mg into the skin once a week. 0.'25mg'$  weekly for 4 weeks then 0.'5mg'$  weekly   Current Facility-Administered Medications for the 05/17/22 encounter (Office Visit) with Jerline Pain, MD  Medication   diclofenac Sodium (VOLTAREN) 1 % topical gel 2 g     Allergies:   Patient has no known allergies.   Social History    Socioeconomic History   Marital status: Married    Spouse name: Not on file   Number of children: 1   Years of education: 12   Highest education level: High school graduate  Occupational History   Occupation: Retired  Tobacco Use   Smoking status: Never   Smokeless tobacco: Never  Vaping Use   Vaping Use: Never used  Substance and Sexual Activity   Alcohol use: No   Drug use: No   Sexual activity: Yes  Other Topics Concern   Not on file  Social History Narrative   Not on file   Social Determinants of Health   Financial Resource Strain: Low Risk  (10/19/2020)   Overall Financial Resource Strain (CARDIA)    Difficulty of Paying Living Expenses: Not hard at all  Food Insecurity: No Food Insecurity (10/19/2020)   Hunger Vital Sign    Worried About Running Out of Food in the Last Year: Never true    [place] in the Last Year: Never true  Transportation Needs: No Transportation Needs (10/19/2020)   PRAPARE - Hydrologist (Medical): No    Lack of Transportation (Non-Medical): No  Physical Activity: Sufficiently Active (10/19/2020)   Exercise Vital Sign    Days of Exercise per Week: 5 days    Minutes of Exercise per Session: 30 min  Stress: No Stress Concern Present (10/19/2020)   [place]    Feeling of Stress : Not at all  Social Connections: [place] (10/19/2020)   Social Connection and Isolation Panel [NHANES]    Frequency of Communication with Friends and Family: More than three times a week    Frequency of Social Gatherings with Friends and Family: More than three times a week    Attends Religious Services: More than 4 times per year    Active Member of [employer] or Organizations: Yes    Attends Music therapist: More than 4 times per year    Marital Status: Married     Family History: The patient's family history includes CAD in her brother;  Diabetes in her brother and brother; Hypertension in her brother, brother, and mother. There is no history of Colon cancer, Esophageal cancer, Pancreatic cancer, Liver disease, Stomach cancer, Rectal cancer, or Colon polyps.  ROS:   Please see the history of present illness.     All other systems reviewed and are negative.  EKGs/Labs/Other Studies Reviewed:    The following studies were reviewed today: ECHO 2022:   1. Left ventricular ejection fraction, by estimation, is 55 to 60%. The  left ventricle has normal function. The left ventricle has no regional  wall motion abnormalities. Left ventricular diastolic parameters were  normal.   2. Right ventricular systolic function is normal. The  right ventricular  size is normal.   3. The mitral valve is normal in structure. Trivial mitral valve  regurgitation. No evidence of mitral stenosis.   4. The aortic valve is normal in structure. Aortic valve regurgitation is  not visualized. No aortic stenosis is present.   5. The inferior vena cava is normal in size with greater than 50%  respiratory variability, suggesting right atrial pressure of 3 mmHg.   EKG:  EKG is  ordered today.  The ekg ordered today demonstrates sinus rhythm 65 right bundle branch block  Recent Labs: 08/09/2021: ALT 16 02/23/2022: Hemoglobin 10.2 05/04/2022: BUN 36; Creatinine, Ser 1.68; Potassium 4.4; Sodium 137  Recent Lipid Panel    Component Value Date/Time   CHOL 128 05/02/2021 1012   TRIG 64.0 05/02/2021 1012   HDL 58.60 05/02/2021 1012   CHOLHDL 2 05/02/2021 1012   VLDL 12.8 05/02/2021 1012   LDLCALC 56 05/02/2021 1012   LDLDIRECT 33.0 01/23/2022 1026     Risk Assessment/Calculations:              Physical Exam:    VS:  BP 138/66   Pulse 65   Ht 4' 8.25" (1.429 m)   Wt 101 lb 12.8 oz (46.2 kg)   SpO2 95%   BMI 22.62 kg/m     Wt Readings from Last 3 Encounters:  05/17/22 101 lb 12.8 oz (46.2 kg)  05/08/22 101 lb 12.8 oz (46.2 kg)   02/08/22 115 lb 4.8 oz (52.3 kg)     GEN:  Well nourished, well developed in no acute distress HEENT: Normal NECK: No JVD; No carotid bruits LYMPHATICS: No lymphadenopathy CARDIAC: CABG scar. RRR, no murmurs, no rubs, gallops RESPIRATORY:  Clear to auscultation without rales, wheezing or rhonchi  ABDOMEN: Soft, non-tender, non-distended MUSCULOSKELETAL:  No edema; No deformity  SKIN: Warm and dry NEUROLOGIC:  Alert and oriented x 3 PSYCHIATRIC:  Normal affect   ASSESSMENT:    1. S/P CABG x 4   2. Essential hypertension   3. Mixed hyperlipidemia   4. Diabetes mellitus due to underlying condition with hyperosmolarity without coma, unspecified whether long term insulin use (HCC)    PLAN:    In order of problems listed above:  Coronary artery disease status post CABG 2022 - Dr. Kipp Brood.  Doing well.  No anginal symptoms.  Prior myocardial infarction surrounding. - Continue with aspirin 81 mg high intensity statin LDL 56 down from 226.  Also on Zetia as well 10 mg.  No changes made.  Primary hypertension - Continuing with metoprolol 25 mg twice a day losartan 100 mg a day.  No change in prescription drug management.  Good control.  Diet and exercise.  Pure hypercholesterolemia - LDL 226 on Crestor 20 and Zetia 10.  LDL 56.  Excellent.  No myalgias.  Diabetes with hypertension - Dr. Dwyane Dee.  Hemoglobin A1c 7.9.  Chronic kidney disease stage IIIa - Creatinine 1.68, sees nephrology.  Knows to avoid Advil.       Medication Adjustments/Labs and Tests Ordered: Current medicines are reviewed at length with the patient today.  Concerns regarding medicines are outlined above.  Orders Placed This Encounter  Procedures   EKG 12-Lead   No orders of the defined types were placed in this encounter.   Patient Instructions  Medication Instructions:  The current medical regimen is effective;  continue present plan and medications.  *If you need a refill on your cardiac  medications before your next appointment, please call your  pharmacy*  Follow-Up: At [facility], you and your health needs are our priority.  As part of our continuing mission to provide you with exceptional heart care, we have created designated Provider Care Teams.  These Care Teams include your primary Cardiologist (physician) and Advanced Practice Providers (APPs -  Physician Assistants and Nurse Practitioners) who all work together to provide you with the care you need, when you need it.  We recommend signing up for the patient portal called "MyChart".  Sign up information is provided on this After Visit Summary.  MyChart is used to connect with patients for Virtual Visits (Telemedicine).  Patients are able to view lab/test results, encounter notes, upcoming appointments, etc.  Non-urgent messages can be sent to your provider as well.   To learn more about what you can do with MyChart, go to [email or web address].    Your next appointment:   1 year(s)  The format for your next appointment:   In Person  Provider:   Candee Furbish, MD {   Important Information About Sugar         Signed, Candee Furbish, MD  05/17/2022 10:25 AM    [place]

## 2022-05-25 NOTE — Telephone Encounter (Signed)
[facility] called states that Ozempic is making patient lose weight. She is 105.6lbs and wants to know if there is an alternative medication she can be put on?

## 2022-05-31 ENCOUNTER — Other Ambulatory Visit: Payer: Self-pay

## 2022-05-31 NOTE — Patient Outreach (Signed)
  Care Coordination   05/31/2022 Name: Mariah Peterson MRN: [id number] DOB: 02-08-1946   Care Coordination Outreach Attempts:  An unsuccessful telephone outreach was attempted today to offer the patient information about available care coordination services as a benefit of their health plan.   Follow Up Plan:  Additional outreach attempts will be made to offer the patient care coordination information and services.   Encounter Outcome:  No Answer  Care Coordination Interventions Activated:  No   Care Coordination Interventions:  No, not indicated    Peter Garter RN, BSN,CCM, [place] [facility] [phone]

## 2022-06-01 ENCOUNTER — Other Ambulatory Visit: Payer: Self-pay | Admitting: Endocrinology

## 2022-06-06 ENCOUNTER — Telehealth: Payer: Self-pay

## 2022-06-06 NOTE — Telephone Encounter (Signed)
Attempted to contact the patient to inform her that we have received her patient assistance from [employer]. (5 boxes of Ozempic).  Line rang busy with 3 attempts.

## 2022-06-07 ENCOUNTER — Other Ambulatory Visit: Payer: Self-pay | Admitting: Endocrinology

## 2022-06-11 ENCOUNTER — Ambulatory Visit: Payer: Medicare Other | Admitting: Internal Medicine

## 2022-06-12 ENCOUNTER — Encounter: Payer: Self-pay | Admitting: *Deleted

## 2022-06-12 ENCOUNTER — Other Ambulatory Visit: Payer: Self-pay | Admitting: *Deleted

## 2022-06-12 ENCOUNTER — Ambulatory Visit: Payer: Medicare Other | Admitting: Internal Medicine

## 2022-06-12 NOTE — Patient Outreach (Signed)
  Care Coordination   Initial Visit Note   06/12/2022 Name: Mariah Peterson MRN: [id number] DOB: 1946-07-11  Mariah Peterson is a 76 y.o. year old female who sees Isaac Bliss, Rayford Halsted, MD for primary care. I spoke with  [name] by phone today  What matters to the patients health and wellness today?  No needs    Goals Addressed               This Visit's Progress     No follow up required (pt-stated)        Care Coordination Interventions: Reviewed medications with patient and discussed importance of medication adherence Reviewed scheduled/upcoming provider appointments including: pending appointments and request pt to scheduled her AWV at her provides office on tomorrow's office visit.  Screening for signs and symptoms of depression related to chronic disease state  Assessed social determinant of health barriers Pt reports her diabetes is managed with CBG <100 with AM reads          SDOH assessments and interventions completed:  Yes  SDOH Interventions Today    Flowsheet Row Most Recent Value  SDOH Interventions   Food Insecurity Interventions Intervention Not Indicated  Housing Interventions Intervention Not Indicated  Transportation Interventions Intervention Not Indicated        Care Coordination Interventions Activated:  Yes  Care Coordination Interventions:  Yes, provided   Follow up plan: No further intervention required.   Encounter Outcome:  Pt. Visit Completed   Raina Mina, RN Care Management Coordinator [place] Office [phone]

## 2022-06-12 NOTE — Patient Instructions (Signed)
Visit Information  Thank you for taking time to visit with me today. Please don't hesitate to contact me if I can be of assistance to you.   Following are the goals we discussed today:   Goals Addressed               This Visit's Progress     No follow up required (pt-stated)        Care Coordination Interventions: Reviewed medications with patient and discussed importance of medication adherence Reviewed scheduled/upcoming provider appointments including: pending appointments and request pt to scheduled her AWV at her provides office on tomorrow's office visit.  Screening for signs and symptoms of depression related to chronic disease state  Assessed social determinant of health barriers Pt reports her diabetes is managed with CBG <100 with AM reads            Please call the care guide team at [phone] if you need to cancel or reschedule your appointment.   If you are experiencing a Mental Health or [place] or need someone to talk to, please call the Suicide and Crisis Lifeline: 988  The patient verbalized understanding of instructions, educational materials, and care plan provided today and DECLINED offer to receive copy of patient instructions, educational materials, and care plan.   No further follow up required: No needs    Raina Mina, RN Care Management Coordinator [place] Office [phone]

## 2022-06-13 ENCOUNTER — Ambulatory Visit (INDEPENDENT_AMBULATORY_CARE_PROVIDER_SITE_OTHER): Payer: Medicare Other | Admitting: Internal Medicine

## 2022-06-13 ENCOUNTER — Encounter: Payer: Self-pay | Admitting: Internal Medicine

## 2022-06-13 VITALS — BP 143/67 | HR 59 | Temp 98.0°F | Wt 111.8 lb

## 2022-06-13 DIAGNOSIS — E782 Mixed hyperlipidemia: Secondary | ICD-10-CM | POA: Diagnosis not present

## 2022-06-13 DIAGNOSIS — I2510 Atherosclerotic heart disease of native coronary artery without angina pectoris: Secondary | ICD-10-CM

## 2022-06-13 DIAGNOSIS — I10 Essential (primary) hypertension: Secondary | ICD-10-CM

## 2022-06-13 DIAGNOSIS — Z794 Long term (current) use of insulin: Secondary | ICD-10-CM | POA: Diagnosis not present

## 2022-06-13 DIAGNOSIS — E1122 Type 2 diabetes mellitus with diabetic chronic kidney disease: Secondary | ICD-10-CM

## 2022-06-13 DIAGNOSIS — N1832 Chronic kidney disease, stage 3b: Secondary | ICD-10-CM

## 2022-06-13 LAB — POCT GLYCOSYLATED HEMOGLOBIN (HGB A1C): Hemoglobin A1C: 7 % — AB (ref 4.0–5.6)

## 2022-06-13 MED ORDER — LOSARTAN POTASSIUM 100 MG PO TABS: 50.0000 mg | ORAL_TABLET | Freq: Every day | ORAL | 1 refills | Status: DC

## 2022-06-13 MED ORDER — FUROSEMIDE 20 MG PO TABS: ORAL_TABLET | ORAL | 0 refills | Status: DC

## 2022-06-13 NOTE — Progress Notes (Signed)
Established Patient Office Visit     CC/Reason for Visit: Follow-up chronic medical conditions  HPI: Mariah Peterson is a 76 y.o. female who is coming in today for the above mentioned reasons. Past Medical History is significant for: Hypertension, hyperlipidemia, insulin-dependent diabetes followed by Dr. Dwyane Dee.  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  She is followed by thoracic surgeon Dr. Kipp Brood, her cardiologist is Dr. Marlou Porch.  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  She recently saw her nephrologist, notes have been reviewed in detail, she was noted to have acute kidney injury and her losartan dose was decreased from 100-50 and she was asked to take furosemide 3 times a week instead of daily.  She has had worsening of lower extremity edema.  She is not wearing compression stockings.  A1c has improved.  She was placed on Ozempic by her endocrinologist in addition to insulin.  Past Medical/Surgical History: Past Medical History:  Diagnosis Date   Chest pain 11/30/2020   Coronary artery disease    Diabetes mellitus without complication (HCC)    Glaucoma    History of chicken pox    Hypertension     Past Surgical History:  Procedure Laterality Date   BREAST BIOPSY Right 2017   benign   CARDIAC CATHETERIZATION     CATARACT EXTRACTION, BILATERAL Bilateral    CESAREAN SECTION     COLONOSCOPY  1980's   CORONARY ARTERY BYPASS GRAFT N/A 12/05/2020   Procedure: CORONARY ARTERY BYPASS GRAFTING (CABG), ON PUMP, TIMES FOUR, USING LEFT INTERNAL MAMMARY ARTERY AND BILATERAL ENDOSCOPICALLY HARVESTED GREATER SAPHENOUS VEINS;  Surgeon: [name], MD;  Location: [place];  Service: Open Heart Surgery;  Laterality: N/A;   LEFT HEART CATH AND CORONARY ANGIOGRAPHY N/A 11/30/2020   Procedure: LEFT HEART CATH AND CORONARY ANGIOGRAPHY;  Surgeon: Belva Crome, MD;   Location: [place] [facility];  Service: Cardiovascular;  Laterality: N/A;   TEE WITHOUT CARDIOVERSION N/A 12/05/2020   Procedure: TRANSESOPHAGEAL ECHOCARDIOGRAM (TEE);  Surgeon: [name], MD;  Location: [place];  Service: Open Heart Surgery;  Laterality: N/A;   TUBAL LIGATION      Social History:  reports that she has never smoked. She has never used smokeless tobacco. She reports that she does not drink alcohol and does not use drugs.  Allergies: No Known Allergies  Family History:  Family History  Problem Relation Age of Onset   Hypertension Mother    Diabetes Brother    CAD Brother    Hypertension Brother    Diabetes Brother    Hypertension Brother    Colon cancer Neg Hx    Esophageal cancer Neg Hx    Pancreatic cancer Neg Hx    Liver disease Neg Hx    Stomach cancer Neg Hx    Rectal cancer Neg Hx    Colon polyps Neg Hx      Current Outpatient Medications:    amLODipine (NORVASC) 5 MG tablet, Take 1 tablet (5 mg total) by mouth daily., Disp: 90 tablet, Rfl: 1   aspirin EC 81 MG tablet, Take 1 tablet (81 mg total) by mouth daily. Swallow whole., Disp: 90 tablet, Rfl: 3   calcium carbonate (OSCAL) 1500 (600 Ca) MG TABS tablet, Take 1 tablet by mouth 2 (two) times daily with a meal., Disp: , Rfl:    cloNIDine (CATAPRES)  0.1 MG tablet, Take 1 tablet (0.1 mg total) by mouth 2 (two) times daily., Disp: 60 tablet, Rfl: 5   Continuous Blood Gluc Sensor (FREESTYLE LIBRE 2 SENSOR) MISC, CHANGE EVERY 14 DAYS, Disp: 2 each, Rfl: 0   ezetimibe (ZETIA) 10 MG tablet, Take 1 tablet by mouth once daily, Disp: 90 tablet, Rfl: 0   gabapentin (NEURONTIN) 100 MG capsule, Take 1 capsule (100 mg total) by mouth 3 (three) times daily., Disp: 90 capsule, Rfl: 3   glucose blood test strip, Use as instructed to test blood sugar 3 times daily E11.65, Disp: 270 each, Rfl: 1   insulin aspart (NOVOLOG FLEXPEN) 100 UNIT/ML FlexPen, 6 UNITS BEFORE Breakfast on exercise days and if eating eggs 8  Units on other days LUNCH COVERAGE: NOVOLOG 8 UNITS if eating at home and 10 units for fast food Dinner 6 units Novolog before eating, Disp: 15 mL, Rfl: 3   insulin glargine-yfgn (SEMGLEE) 100 UNIT/ML Pen, INJECT 16 UNITS SUBCUTANEOUSLY ONCE DAILY, Disp: 15 mL, Rfl: 0   metoprolol tartrate (LOPRESSOR) 25 MG tablet, Take 1 tablet (25 mg total) by mouth 2 (two) times daily., Disp: 90 tablet, Rfl: 1   pioglitazone (ACTOS) 15 MG tablet, Take 1 tablet by mouth once daily, Disp: 30 tablet, Rfl: 2   rosuvastatin (CRESTOR) 20 MG tablet, Take 1 tablet by mouth once daily, Disp: 30 tablet, Rfl: 0   Semaglutide,0.25 or 0.'5MG'$ /DOS, (OZEMPIC, 0.25 OR 0.5 MG/DOSE,) 2 MG/1.5ML SOPN, Inject 0.5 mg into the skin once a week. 0.'25mg'$  weekly for 4 weeks then 0.'5mg'$  weekly, Disp: 1.5 mL, Rfl: 2   furosemide (LASIX) 20 MG tablet, Take MWF, Disp: 30 tablet, Rfl: 0   losartan (COZAAR) 100 MG tablet, Take 0.5 tablets (50 mg total) by mouth daily., Disp: 90 tablet, Rfl: 1  Current Facility-Administered Medications:    diclofenac Sodium (VOLTAREN) 1 % topical gel 2 g, 2 g, Topical, TID, Mariah Pinks M, PA-C  Review of Systems:  Constitutional: Denies fever, chills, diaphoresis, appetite change and fatigue.  HEENT: Denies photophobia, eye pain, redness, hearing loss, ear pain, congestion, sore throat, rhinorrhea, sneezing, mouth sores, trouble swallowing, neck pain, neck stiffness and tinnitus.   Respiratory: Denies SOB, DOE, cough, chest tightness,  and wheezing.   Cardiovascular: Denies chest pain, palpitations. Gastrointestinal: Denies nausea, vomiting, abdominal pain, diarrhea, constipation, blood in stool and abdominal distention.  Genitourinary: Denies dysuria, urgency, frequency, hematuria, flank pain and difficulty urinating.  Endocrine: Denies: hot or cold intolerance, sweats, changes in hair or nails, polyuria, polydipsia. Musculoskeletal: Denies myalgias, back pain, joint swelling, arthralgias and gait  problem.  Skin: Denies pallor, rash and wound.  Neurological: Denies dizziness, seizures, syncope, weakness, light-headedness, numbness and headaches.  Hematological: Denies adenopathy. Easy bruising, personal or family bleeding history  Psychiatric/Behavioral: Denies suicidal ideation, mood changes, confusion, nervousness, sleep disturbance and agitation    Physical Exam: Vitals:   06/13/22 0947 06/13/22 0950 06/13/22 0959  BP: (!) 140/60 (!) 142/42 (!) 143/67  Pulse: (!) 59    Temp: 98 F (36.7 C)    TempSrc: Oral    SpO2: 99%    Weight: 111 lb 12.8 oz (50.7 kg)      Body mass index is 24.84 kg/[place].   Constitutional: NAD, calm, comfortable Eyes: PERRL, lids and conjunctivae normal, wears corrective lenses ENMT: Mucous membranes are moist.  Respiratory: clear to auscultation bilaterally, no wheezing, no crackles. Normal respiratory effort. No accessory muscle use.  Cardiovascular: Regular rate and rhythm, no murmurs / rubs /  gallops.  2+ pitting lower extremity edema.  Psychiatric: Normal judgment and insight. Alert and oriented x 3. Normal mood.    Impression and Plan:  Type 2 diabetes mellitus with stage 3b chronic kidney disease, with long-term current use of insulin ([place])  - Plan: POCT glycosylated hemoglobin (Hb A1C) -Improved control with an A1c of 7, followed by endocrinology.  Mixed hyperlipidemia -Supposedly on rosuvastatin 20 mg and ezetimibe 10 mg.  Last LDL in July was at goal 56.  Primary hypertension -Blood pressure is a little higher, suspect related to decrease in furosemide and losartan doses in response to her acute kidney injury.  No changes today.  Suspect lower extremity edema is also related to decrease in furosemide dosing and also possibly chronic venous insufficiency.  She has compression stockings at home and have advised her to wear them.  Coronary artery disease involving native coronary artery of native heart without angina pectoris -Stable, no  chest pain, followed by cardiology.    Time spent:34 minutes reviewing chart, interviewing and examining patient and formulating plan of care.    Lelon Frohlich, MD [facility]

## 2022-06-27 ENCOUNTER — Ambulatory Visit (INDEPENDENT_AMBULATORY_CARE_PROVIDER_SITE_OTHER): Payer: Medicare Other

## 2022-06-27 VITALS — BP 134/62 | HR 60 | Temp 98.1°F | Ht <= 58 in | Wt 110.9 lb

## 2022-06-27 DIAGNOSIS — Z0000 Encounter for general adult medical examination without abnormal findings: Secondary | ICD-10-CM | POA: Diagnosis not present

## 2022-06-27 NOTE — Progress Notes (Signed)
Subjective:   Mariah Peterson is a 76 y.o. female who presents for Medicare Annual (Subsequent) preventive examination.  Review of Systems      Cardiac Risk Factors include: advanced age (>55mn, >>64women);diabetes mellitus;hypertension     Objective:    Today's Vitals   06/27/22 1031  BP: 134/62  Pulse: 60  Temp: 98.1 F (36.7 C)  TempSrc: Oral  SpO2: 95%  Weight: 110 lb 14.4 oz (50.3 kg)  Height: '4\' 8"'$  (1.422 m)   Body mass index is 24.86 kg/m.     06/27/2022   10:46 AM 06/12/2022    9:46 AM 11/30/2020    2:14 PM 10/19/2020    9:06 AM 09/30/2019   10:40 AM 05/15/2019    8:22 AM 09/30/2018   10:23 AM  Advanced Directives  Does Patient Have a Medical Advance Directive? No No No No No No Yes  Would patient like information on creating a medical advance directive? No - Patient declined No - Patient declined No - Patient declined No - Patient declined Yes (ED - Information included in AVS)      Current Medications (verified) Outpatient Encounter Medications as of 06/27/2022  Medication Sig   amLODipine (NORVASC) 5 MG tablet Take 1 tablet (5 mg total) by mouth daily.   aspirin EC 81 MG tablet Take 1 tablet (81 mg total) by mouth daily. Swallow whole.   calcium carbonate (OSCAL) 1500 (600 Ca) MG TABS tablet Take 1 tablet by mouth 2 (two) times daily with a meal.   cloNIDine (CATAPRES) 0.1 MG tablet Take 1 tablet (0.1 mg total) by mouth 2 (two) times daily.   Continuous Blood Gluc Sensor (FREESTYLE LIBRE 2 SENSOR) MISC CHANGE EVERY 14 DAYS   ezetimibe (ZETIA) 10 MG tablet Take 1 tablet by mouth once daily   furosemide (LASIX) 20 MG tablet Take MWF   gabapentin (NEURONTIN) 100 MG capsule Take 1 capsule (100 mg total) by mouth 3 (three) times daily.   glucose blood test strip Use as instructed to test blood sugar 3 times daily E11.65   insulin aspart (NOVOLOG FLEXPEN) 100 UNIT/ML FlexPen 6 UNITS BEFORE Breakfast on exercise days and if eating eggs 8 Units on other days LUNCH  COVERAGE: NOVOLOG 8 UNITS if eating at home and 10 units for fast food Dinner 6 units Novolog before eating   insulin glargine-yfgn (SEMGLEE) 100 UNIT/ML Pen INJECT 16 UNITS SUBCUTANEOUSLY ONCE DAILY   losartan (COZAAR) 100 MG tablet Take 0.5 tablets (50 mg total) by mouth daily.   metoprolol tartrate (LOPRESSOR) 25 MG tablet Take 1 tablet (25 mg total) by mouth 2 (two) times daily.   pioglitazone (ACTOS) 15 MG tablet Take 1 tablet by mouth once daily   rosuvastatin (CRESTOR) 20 MG tablet Take 1 tablet by mouth once daily   Semaglutide,0.25 or 0.'5MG'$ /DOS, (OZEMPIC, 0.25 OR 0.5 MG/DOSE,) 2 MG/1.5ML SOPN Inject 0.5 mg into the skin once a week. 0.'25mg'$  weekly for 4 weeks then 0.'5mg'$  weekly   Facility-Administered Encounter Medications as of 06/27/2022  Medication   diclofenac Sodium (VOLTAREN) 1 % topical gel 2 g    Allergies (verified) Patient has no known allergies.   History: Past Medical History:  Diagnosis Date   Chest pain 11/30/2020   Coronary artery disease    [place]    Glaucoma    History of chicken pox    Hypertension    Past Surgical History:  Procedure Laterality Date   BREAST BIOPSY Right 2017  benign   CARDIAC CATHETERIZATION     CATARACT EXTRACTION, BILATERAL Bilateral    CESAREAN SECTION     COLONOSCOPY  1980's   CORONARY ARTERY BYPASS GRAFT N/A 12/05/2020   Procedure: CORONARY ARTERY BYPASS GRAFTING (CABG), ON PUMP, TIMES FOUR, USING LEFT INTERNAL MAMMARY ARTERY AND BILATERAL ENDOSCOPICALLY HARVESTED GREATER SAPHENOUS VEINS;  Surgeon: [name], MD;  Location: [facility];  Service: Open Heart Surgery;  Laterality: N/A;   LEFT HEART CATH AND CORONARY ANGIOGRAPHY N/A 11/30/2020   Procedure: LEFT HEART CATH AND CORONARY ANGIOGRAPHY;  Surgeon: Belva Crome, MD;  Location: [facility];  Service: Cardiovascular;  Laterality: N/A;   TEE WITHOUT CARDIOVERSION N/A 12/05/2020   Procedure: TRANSESOPHAGEAL ECHOCARDIOGRAM (TEE);   Surgeon: [name], MD;  Location: [facility];  Service: Open Heart Surgery;  Laterality: N/A;   TUBAL LIGATION     Family History  Problem Relation Age of Onset   Hypertension Mother    Diabetes Brother    CAD Brother    Hypertension Brother    Diabetes Brother    Hypertension Brother    Colon cancer Neg Hx    Esophageal cancer Neg Hx    Pancreatic cancer Neg Hx    Liver disease Neg Hx    Stomach cancer Neg Hx    Rectal cancer Neg Hx    Colon polyps Neg Hx    Social History   Socioeconomic History   Marital status: Married    Spouse name: Not on file   Number of children: 1   Years of education: 12   Highest education level: High school graduate  Occupational History   Occupation: Retired  Tobacco Use   Smoking status: Never   Smokeless tobacco: Never  Vaping Use   Vaping Use: Never used  Substance and Sexual Activity   Alcohol use: No   Drug use: No   Sexual activity: Yes  Other Topics Concern   Not on file  Social History Narrative   Not on file   Social Determinants of Health   Financial Resource Strain: Low Risk  (06/27/2022)   Overall Financial Resource Strain (CARDIA)    Difficulty of Paying Living Expenses: Not hard at all  Food Insecurity: No Food Insecurity (06/27/2022)   Hunger Vital Sign    Worried About Running Out of Food in the Last Year: Never true    Ran Out of Food in the Last Year: Never true  Transportation Needs: No Transportation Needs (06/27/2022)   PRAPARE - Hydrologist (Medical): No    Lack of Transportation (Non-Medical): No  Physical Activity: Insufficiently Active (06/27/2022)   Exercise Vital Sign    Days of Exercise per Week: 3 days    Minutes of Exercise per Session: 30 min  Stress: No Stress Concern Present (06/27/2022)   [place]    Feeling of Stress : Not at all  Social Connections: [place] (06/27/2022)   Social  Connection and Isolation Panel [NHANES]    Frequency of Communication with Friends and Family: More than three times a week    Frequency of Social Gatherings with Friends and Family: More than three times a week    Attends Religious Services: More than 4 times per year    Active Member of [employer] or Organizations: Yes    Attends Music therapist: More than 4 times per year    Marital Status: Married  Tobacco Counseling Counseling given: Not Answered   Clinical Intake:  Pre-visit preparation completed: NoNutrition Risk Assessment:  Has the patient had any N/V/D within the last 2 months?  No  Does the patient have any non-healing wounds?  No  Has the patient had any unintentional weight loss or weight gain?  No   Diabetes:  Is the patient diabetic?  Yes  If diabetic, was a CBG obtained today?  Yes  CBG  133 Taken by patient/Libre Monitor Did the patient bring in their glucometer from home?  No  How often do you monitor your CBG's? Daily.   Financial Strains and Diabetes Management:  Are you having any financial strains with the device, your supplies or your medication? No .  Does the patient want to be seen by Chronic Care Management for management of their diabetes?  No  Would the patient like to be referred to a Nutritionist or for Diabetic Management?  No   Diabetic Exams:  Diabetic Eye Exam: Completed No. Overdue for diabetic eye exam. Pt has been advised about the importance in completing this exam. A referral has been placed today. Message sent to referral coordinator for scheduling purposes. Advised pt to expect a call from office referred to regarding appt.  Diabetic Foot Exam: Completed No. Pt has been advised about the importance in completing this exam. Pt is scheduled for diabetic foot exam on Followed by PCP.    Pain : No/denies pain     BMI - recorded: 24.86 Nutritional Status: BMI of 19-24  Normal Diabetes: Yes CBG done?: Yes CBG resulted in  Enter/ Edit results?: Yes (CBG 133 Taken by patient) Did pt. bring in CBG monitor from home?: Yes Glucose Meter Downloaded?: Comment (Libre Monitor)  How often do you need to have someone help you when you read instructions, pamphlets, or other written materials from your doctor or pharmacy?: 1 - Never  Diabetic?  Yes  Interpreter Needed?: No  Information entered by :: Rolene Arbour LPN   Activities of Daily Living    06/27/2022   10:42 AM  In your present state of health, do you have any difficulty performing the following activities:  Hearing? 0  Vision? 0  Difficulty concentrating or making decisions? 0  Walking or climbing stairs? 0  Dressing or bathing? 0  Doing errands, shopping? 0  Preparing Food and eating ? N  Using the Toilet? N  In the past six months, have you accidently leaked urine? N  Do you have problems with loss of bowel control? N  Managing your Medications? N  Managing your Finances? N  Housekeeping or managing your Housekeeping? N    Patient Care Team: Isaac Bliss, Rayford Halsted, MD as PCP - General (Internal Medicine) Jerline Pain, MD as PCP - Cardiology (Cardiology) Elayne Snare, MD as Consulting Physician (Endocrinology) Shirley Muscat Loreen Freud, MD as Referring Physician (Optometry) Danis, Kirke Corin, MD as Consulting Physician (Gastroenterology) Clent Jacks, MD as Consulting Physician (Ophthalmology) Kidney, [place] any recent Medical Services you may have received from other than [facility] providers in the past year (date may be approximate).     Assessment:   This is a routine wellness examination for Ednah.  Hearing/Vision screen Hearing Screening - Comments:: Denies hearing difficulties   Vision Screening - Comments:: Wears rx  glasses - up to date with routine eye exams with Dr Katy Fitch   Dietary issues and exercise activities discussed: Current Exercise Habits: Home exercise routine, Type of exercise: walking, Time (  Minutes): 30,  Frequency (Times/Week): 3, Weekly Exercise (Minutes/Week): 90, Intensity: Moderate, Exercise limited by: None identified   Goals Addressed               This Visit's Progress     No current goals (pt-stated)         Depression Screen    06/27/2022   10:40 AM 06/13/2022   10:03 AM 06/12/2022    9:37 AM 02/08/2022   10:05 AM 05/05/2021   11:14 AM 02/23/2021    1:43 PM 02/23/2021   11:30 AM  PHQ 2/9 Scores  PHQ - 2 Score 0 0 0 0 0 0 0  PHQ- 9 Score 0 '2  3 1      '$ Fall Risk    06/27/2022   10:44 AM 06/13/2022   10:04 AM 02/08/2022   10:06 AM 02/23/2021   11:53 AM 10/19/2020    9:07 AM  Fall Risk   Falls in the past year? 1 1 0 0 0  Number falls in past yr: 0 0 0 0 0  Injury with Fall? 0 0 0 0 0  Comment No injury or medical attention needed      Risk for fall due to : No Fall Risks No Fall Risks No Fall Risks Impaired balance/gait No Fall Risks  Follow up  Falls evaluation completed Falls evaluation completed Falls evaluation completed Falls evaluation completed    [place]:  Any stairs in or around the home? No  If so, are there any without handrails? No  Home free of loose throw rugs in walkways, pet beds, electrical cords, etc? Yes  Adequate lighting in your home to reduce risk of falls? Yes   ASSISTIVE DEVICES UTILIZED TO PREVENT FALLS:  Life alert? No  Use of a cane, walker or w/c? No  Grab bars in the bathroom? No  Shower chair or bench in shower? No  Elevated toilet seat or a handicapped toilet? No her  TIMED UP AND GO:  Was the test performed? Yes .  Length of time to ambulate 10 feet: 10 sec.   Gait steady and fast without use of assistive device  Cognitive Function:        06/27/2022   10:47 AM  6CIT Screen  What Year? 0 points  What month? 0 points  What time? 0 points  Count back from 20 0 points  Months in reverse 0 points  Repeat phrase 0 points  Total Score 0 points    Immunizations Immunization History   Administered Date(s) Administered   Fluad Quad(high Dose 65+) 07/08/2019, 08/16/2021   Influenza, High Dose Seasonal PF 08/18/2020   Influenza-Unspecified 07/21/2018   PFIZER(Purple Top)SARS-COV-2 Vaccination 12/30/2019, 01/21/2020, 09/03/2020   Pneumococcal Conjugate-13 01/28/2018   Pneumococcal Polysaccharide-23 09/30/2019   Tdap 12/03/2018    TDAP status: Up to date  Flu Vaccine status: Up to date  Pneumococcal vaccine status: Up to date  Covid-19 vaccine status: Completed vaccines  Qualifies for Shingles Vaccine? Yes   Zostavax completed No   Shingrix Completed?: No.    Education has been provided regarding the importance of this vaccine. Patient has been advised to call insurance company to determine out of pocket expense if they have not yet received this vaccine. Advised may also receive vaccine at local pharmacy or Health Dept. Verbalized acceptance and understanding.  Screening Tests Health Maintenance  Topic Date Due   OPHTHALMOLOGY EXAM  06/23/2022   COVID-19 Vaccine (4 - [employer]  series) 06/29/2022 (Originally 10/29/2020)   Zoster Vaccines- Shingrix (1 of 2) 09/13/2022 (Originally 03/20/1997)   INFLUENZA VACCINE  01/20/2023 (Originally 05/22/2022)   FOOT EXAM  08/16/2022   HEMOGLOBIN A1C  12/14/2022   TETANUS/TDAP  12/03/2028   COLONOSCOPY (Pts 45-49yr Insurance coverage will need to be confirmed)  05/14/2029   Pneumonia Vaccine 75 Years old  Completed   DEXA SCAN  Completed   Hepatitis C Screening  Completed   HPV VACCINES  Aged Out    Health Maintenance  Health Maintenance Due  Topic Date Due   OPHTHALMOLOGY EXAM  06/23/2022    Colorectal cancer screening: Type of screening: Colonoscopy. Completed 05/15/19. Repeat every 10 years    Bone Density status: Completed 06/22/19. Results reflect: Bone density results: OSTEOPOROSIS. Repeat every   years.  Lung Cancer Screening: (Low Dose CT Chest recommended if Age 11090-80years, 30 pack-year currently smoking OR  have quit w/in 15years.) does not qualify.     Additional Screening:  Hepatitis C Screening: does qualify; Completed 06/05/18  Vision Screening: Recommended annual ophthalmology exams for early detection of glaucoma and other disorders of the eye. Is the patient up to date with their annual eye exam?  Yes  Who is the provider or what is the name of the office in which the patient attends annual eye exams? Dr GKaty FitchIf pt is not established with a provider, would they like to be referred to a provider to establish care? No .   Dental Screening: Recommended annual dental exams for proper oral hygiene  Community Resource Referral / Chronic Care Management:  CRR required this visit?  No   CCM required this visit?  No      Plan:     I have personally reviewed and noted the following in the patient's chart:   Medical and social history Use of alcohol, tobacco or illicit drugs  Current medications and supplements including opioid prescriptions. Patient is not currently taking opioid prescriptions. Functional ability and status Nutritional status Physical activity Advanced directives List of other physicians Hospitalizations, surgeries, and ER visits in previous 12 months Vitals Screenings to include cognitive, depression, and falls Referrals and appointments  In addition, I have reviewed and discussed with patient certain preventive protocols, quality metrics, and best practice recommendations. A written personalized care plan for preventive services as well as general preventive health recommendations were provided to patient.     BCriselda Peaches LPN   [id number]  Nurse Notes: None

## 2022-06-27 NOTE — Patient Instructions (Addendum)
Mariah Peterson , Thank you for taking time to come for your Medicare Wellness Visit. I appreciate your ongoing commitment to your health goals. Please review the following plan we discussed and let me know if I can assist you in the future.   Screening recommendations/referrals: Colonoscopy: Done 05/15/19 Repeat 10 yrs Mammogram:  Bone Density: Done Recommended yearly ophthalmology/optometry visit for glaucoma screening and checkup Recommended yearly dental visit for hygiene and checkup  Vaccinations: Influenza vaccine: Up to date Pneumococcal vaccine: Up to date Tdap vaccine: Up to date Shingles vaccine: Deferred   Covid-19:Done  Advanced directives: Advance directive discussed with you today. Even though you declined this today, please call our office should you change your mind, and we can give you the proper paperwork for you to fill out.   Conditions/risks identified: None  Next appointment: Follow up in one year for your annual wellness visit     Preventive Care 65 Years and Older, Female Preventive care refers to lifestyle choices and visits with your health care provider that can promote health and wellness. What does preventive care include? A yearly physical exam. This is also called an annual well check. Dental exams once or twice a year. Routine eye exams. Ask your health care provider how often you should have your eyes checked. Personal lifestyle choices, including: Daily care of your teeth and gums. Regular physical activity. Eating a healthy diet. Avoiding tobacco and drug use. Limiting alcohol use. Practicing safe sex. Taking low-dose aspirin every day. Taking vitamin and mineral supplements as recommended by your health care provider. What happens during an annual well check? The services and screenings done by your health care provider during your annual well check will depend on your age, overall health, lifestyle risk factors, and family history of  disease. Counseling  Your health care provider may ask you questions about your: Alcohol use. Tobacco use. Drug use. Emotional well-being. Home and relationship well-being. Sexual activity. Eating habits. History of falls. Memory and ability to understand (cognition). Work and work Statistician. Reproductive health. Screening  You may have the following tests or measurements: Height, weight, and BMI. Blood pressure. Lipid and cholesterol levels. These may be checked every 5 years, or more frequently if you are over 49 years old. Skin check. Lung cancer screening. You may have this screening every year starting at age 48 if you have a 30-pack-year history of smoking and currently smoke or have quit within the past 15 years. Fecal occult blood test (FOBT) of the stool. You may have this test every year starting at age 22. Flexible sigmoidoscopy or colonoscopy. You may have a sigmoidoscopy every 5 years or a colonoscopy every 10 years starting at age 55. Hepatitis C blood test. Hepatitis B blood test. Sexually transmitted disease (STD) testing. Diabetes screening. This is done by checking your blood sugar (glucose) after you have not eaten for a while (fasting). You may have this done every 1-3 years. Bone density scan. This is done to screen for osteoporosis. You may have this done starting at age 70. Mammogram. This may be done every 1-2 years. Talk to your health care provider about how often you should have regular mammograms. Talk with your health care provider about your test results, treatment options, and if necessary, the need for more tests. Vaccines  Your health care provider may recommend certain vaccines, such as: Influenza vaccine. This is recommended every year. Tetanus, diphtheria, and acellular pertussis (Tdap, Td) vaccine. You may need a Td booster every 10  years. Zoster vaccine. You may need this after age 89. Pneumococcal 13-valent conjugate (PCV13) vaccine. One  dose is recommended after age 31. Pneumococcal polysaccharide (PPSV23) vaccine. One dose is recommended after age 47. Talk to your health care provider about which screenings and vaccines you need and how often you need them. This information is not intended to replace advice given to you by your health care provider. Make sure you discuss any questions you have with your health care provider. Document Released: 11/04/2015 Document Revised: 06/27/2016 Document Reviewed: 08/09/2015 Elsevier Interactive Patient Education  2017 [place] Prevention in the Home Falls can cause injuries. They can happen to people of all ages. There are many things you can do to make your home safe and to help prevent falls. What can I do on the outside of my home? Regularly fix the edges of walkways and driveways and fix any cracks. Remove anything that might make you trip as you walk through a door, such as a raised step or threshold. Trim any bushes or trees on the path to your home. Use bright outdoor lighting. Clear any walking paths of anything that might make someone trip, such as rocks or tools. Regularly check to see if handrails are loose or broken. Make sure that both sides of any steps have handrails. Any raised decks and porches should have guardrails on the edges. Have any leaves, snow, or ice cleared regularly. Use sand or salt on walking paths during winter. Clean up any spills in your garage right away. This includes oil or grease spills. What can I do in the bathroom? Use night lights. Install grab bars by the toilet and in the tub and shower. Do not use towel bars as grab bars. Use non-skid mats or decals in the tub or shower. If you need to sit down in the shower, use a plastic, non-slip stool. Keep the floor dry. Clean up any water that spills on the floor as soon as it happens. Remove soap buildup in the tub or shower regularly. Attach bath mats securely with double-sided  non-slip rug tape. Do not have throw rugs and other things on the floor that can make you trip. What can I do in the bedroom? Use night lights. Make sure that you have a light by your bed that is easy to reach. Do not use any sheets or blankets that are too big for your bed. They should not hang down onto the floor. Have a firm chair that has side arms. You can use this for support while you get dressed. Do not have throw rugs and other things on the floor that can make you trip. What can I do in the kitchen? Clean up any spills right away. Avoid walking on wet floors. Keep items that you use a lot in easy-to-reach places. If you need to reach something above you, use a strong step stool that has a grab bar. Keep electrical cords out of the way. Do not use floor polish or wax that makes floors slippery. If you must use wax, use non-skid floor wax. Do not have throw rugs and other things on the floor that can make you trip. What can I do with my stairs? Do not leave any items on the stairs. Make sure that there are handrails on both sides of the stairs and use them. Fix handrails that are broken or loose. Make sure that handrails are as long as the stairways. Check any carpeting to make sure  that it is firmly attached to the stairs. Fix any carpet that is loose or worn. Avoid having throw rugs at the top or bottom of the stairs. If you do have throw rugs, attach them to the floor with carpet tape. Make sure that you have a light switch at the top of the stairs and the bottom of the stairs. If you do not have them, ask someone to add them for you. What else can I do to help prevent falls? Wear shoes that: Do not have high heels. Have rubber bottoms. Are comfortable and fit you well. Are closed at the toe. Do not wear sandals. If you use a stepladder: Make sure that it is fully opened. Do not climb a closed stepladder. Make sure that both sides of the stepladder are locked into place. Ask  someone to hold it for you, if possible. Clearly mark and make sure that you can see: Any grab bars or handrails. First and last steps. Where the edge of each step is. Use tools that help you move around (mobility aids) if they are needed. These include: Canes. Walkers. Scooters. Crutches. Turn on the lights when you go into a dark area. Replace any light bulbs as soon as they burn out. Set up your furniture so you have a clear path. Avoid moving your furniture around. If any of your floors are uneven, fix them. If there are any pets around you, be aware of where they are. Review your medicines with your doctor. Some medicines can make you feel dizzy. This can increase your chance of falling. Ask your doctor what other things that you can do to help prevent falls. This information is not intended to replace advice given to you by your health care provider. Make sure you discuss any questions you have with your health care provider. Document Released: 08/04/2009 Document Revised: 03/15/2016 Document Reviewed: 11/12/2014 Elsevier Interactive Patient Education  2017 [employer].

## 2022-07-18 ENCOUNTER — Other Ambulatory Visit: Payer: Self-pay | Admitting: Internal Medicine

## 2022-07-18 DIAGNOSIS — I10 Essential (primary) hypertension: Secondary | ICD-10-CM

## 2022-08-06 ENCOUNTER — Other Ambulatory Visit: Payer: Self-pay | Admitting: Endocrinology

## 2022-08-07 ENCOUNTER — Other Ambulatory Visit (INDEPENDENT_AMBULATORY_CARE_PROVIDER_SITE_OTHER): Payer: Medicare Other

## 2022-08-07 DIAGNOSIS — E7800 Pure hypercholesterolemia, unspecified: Secondary | ICD-10-CM

## 2022-08-07 DIAGNOSIS — E1165 Type 2 diabetes mellitus with hyperglycemia: Secondary | ICD-10-CM

## 2022-08-07 DIAGNOSIS — Z794 Long term (current) use of insulin: Secondary | ICD-10-CM | POA: Diagnosis not present

## 2022-08-07 LAB — COMPREHENSIVE METABOLIC PANEL
ALT: 15 U/L (ref 0–35)
AST: 22 U/L (ref 0–37)
Albumin: 4.1 g/dL (ref 3.5–5.2)
Alkaline Phosphatase: 46 U/L (ref 39–117)
BUN: 28 mg/dL — ABNORMAL HIGH (ref 6–23)
CO2: 24 mEq/L (ref 19–32)
Calcium: 9.6 mg/dL (ref 8.4–10.5)
Chloride: 107 mEq/L (ref 96–112)
Creatinine, Ser: 1.43 mg/dL — ABNORMAL HIGH (ref 0.40–1.20)
GFR: 35.91 mL/min — ABNORMAL LOW (ref >60.00)
Glucose, Bld: 146 mg/dL — ABNORMAL HIGH (ref 70–99)
Potassium: 4.4 mEq/L (ref 3.5–5.1)
Sodium: 137 mEq/L (ref 135–145)
Total Bilirubin: 0.6 mg/dL (ref 0.2–1.2)
Total Protein: 7.2 g/dL (ref 6.0–8.3)

## 2022-08-07 LAB — LIPID PANEL
Cholesterol: 117 mg/dL (ref 0–200)
HDL: 52.6 mg/dL (ref >39.00)
LDL Cholesterol: 45 mg/dL (ref 0–99)
NonHDL: 64.68
Total CHOL/HDL Ratio: 2
Triglycerides: 100 mg/dL (ref 0.0–149.0)
VLDL: 20 mg/dL (ref 0.0–40.0)

## 2022-08-07 LAB — HEMOGLOBIN A1C: Hgb A1c MFr Bld: 8.8 % — ABNORMAL HIGH (ref 4.6–6.5)

## 2022-08-10 ENCOUNTER — Encounter: Payer: Self-pay | Admitting: Endocrinology

## 2022-08-10 ENCOUNTER — Ambulatory Visit (INDEPENDENT_AMBULATORY_CARE_PROVIDER_SITE_OTHER): Payer: Medicare Other | Admitting: Endocrinology

## 2022-08-10 VITALS — BP 220/78 | HR 72 | Ht 59.0 in | Wt 111.0 lb

## 2022-08-10 DIAGNOSIS — I2510 Atherosclerotic heart disease of native coronary artery without angina pectoris: Secondary | ICD-10-CM

## 2022-08-10 DIAGNOSIS — E1121 Type 2 diabetes mellitus with diabetic nephropathy: Secondary | ICD-10-CM | POA: Diagnosis not present

## 2022-08-10 DIAGNOSIS — E1140 Type 2 diabetes mellitus with diabetic neuropathy, unspecified: Secondary | ICD-10-CM | POA: Diagnosis not present

## 2022-08-10 DIAGNOSIS — Z794 Long term (current) use of insulin: Secondary | ICD-10-CM | POA: Diagnosis not present

## 2022-08-10 DIAGNOSIS — E1165 Type 2 diabetes mellitus with hyperglycemia: Secondary | ICD-10-CM

## 2022-08-10 DIAGNOSIS — I10 Essential (primary) hypertension: Secondary | ICD-10-CM | POA: Diagnosis not present

## 2022-08-10 MED ORDER — EMPAGLIFLOZIN 10 MG PO TABS: 10.0000 mg | ORAL_TABLET | Freq: Every day | ORAL | 3 refills | Status: DC

## 2022-08-10 MED ORDER — CLONIDINE 0.1 MG/24HR TD PTWK: 0.1000 mg | MEDICATED_PATCH | TRANSDERMAL | 5 refills | Status: DC

## 2022-08-10 NOTE — Patient Instructions (Addendum)
Ask Mariah Peterson 3 update  Blue pen 11 units at Bfst, 8 at lunch and dinner  Clonidine pills every 8 hrs

## 2022-08-10 NOTE — Progress Notes (Unsigned)
Patient ID: Mariah Peterson, female   DOB: August 07, 1946, 76 y.o.   MRN: [id number]          Reason for Appointment: Follow-up    History of Present Illness:          Date of diagnosis of type 2 diabetes mellitus:?  1990       Background history:   She was started on insulin about 4 years ago and previously taking metformin and glipizide No detailed history is available and she does not think she has taken any other types of diabetes medications or injections  Recent history:    INSULIN regimen is: Lantus 12 units daily in am, Novolog 8 units at breakfast, 6 units at lunch and 0-6 supper   Non-insulin hypoglycemic drugs the patient is taking are:   Ozempic 0.25 mg weekly,  Current management, blood sugar patterns and problems identified:  Her A1c is 7.9 compared to 7  Last fructosamine 337  Interpretation of her freestyle libre download between 7/11 and today as follows: Her freestyle Elenor Legato data is somewhat incomplete with only 32% active CGM time  Also blood sugars are very inconsistent  She has had sporadic HYPERGLYCEMIA in the mornings after breakfast, at least once after dinner but no other consistent patterns  Occasionally may have relatively low sugar around dinnertime preceded by a high reading  Overnight blood sugars are generally well controlled with only once low normal reading and no hypoglycemia  AVERAGE overall 158 with 68% within TARGET range  In the last month she was able to get back on her Ozempic  Previously had done well with this but had run out because of difficulty with patient assistance  Although she thinks she is eating well and this has not reduced her appetite her weight appears to be down significantly Not clear if this is related to previous edema She was told to reduce her Lantus by 2 units but she forgot  Also she thinks that she has much higher blood sugars when she is eating cereal in the morning which has happened a couple of times and she  thinks she can switch back to eating grits with either eggs or bacon  Again she forgets to check her sugars with her Elenor Legato and most of her monitoring is only once or twice a day She thinks she has not forgotten to take her insulin before meals   Breakfast usually 8-9 am, lunch 1 PM, supper 6 PM    Glucose sensor: Freestyle libre   Previous data:  CGM use % of time 59  2-week average/GV 167  Time in range     64, was 46   %  % Time Above 180 28+7  % Time above 250   % Time Below 70 1     PRE-MEAL Fasting Lunch Dinner Bedtime Overall  Glucose range:       Averages: 139   196    POST-MEAL PC Breakfast PC Lunch PC Dinner  Glucose range:     Averages: 207 204 218     She was seen by diabetes educator in 9/19       Side effects from medications have been: None  Compliance with the medical regimen: Fair  Typical meal intake: Breakfast is oatmeal, otherwise eggs and toast.  Lunch may be rice and beans or a sandwich at 4 pm Fruit for snacks                Dietician visit, most  recent: 09/2018 CDE consultation: 07/01/2018  Weight history:  Wt Readings from Last 3 Encounters:  08/10/22 111 lb (50.3 kg)  06/27/22 110 lb 14.4 oz (50.3 kg)  06/13/22 111 lb 12.8 oz (50.7 kg)    Glycemic control:   Lab Results  Component Value Date   HGBA1C 8.8 (H) 08/07/2022   HGBA1C 7.0 (A) 06/13/2022   HGBA1C 7.9 (H) 05/04/2022   Lab Results  Component Value Date   MICROALBUR 16.1 (H) 11/28/2021   LDLCALC 45 08/07/2022   CREATININE 1.43 (H) 08/07/2022   Lab Results  Component Value Date   MICRALBCREAT 51.5 (H) 11/28/2021    Lab Results  Component Value Date   FRUCTOSAMINE 346 (H) 05/04/2022   FRUCTOSAMINE 337 (H) 01/23/2022   FRUCTOSAMINE 435 (H) 09/25/2021    Lab on 08/07/2022  Component Date Value Ref Range Status   Cholesterol 08/07/2022 117  0 - 200 mg/dL Final   ATP III Classification       Desirable:  < 200 mg/dL               Borderline High:  200 - 239 mg/dL           High:  > = 240 mg/dL   Triglycerides 08/07/2022 100.0  0.0 - 149.0 mg/dL Final   Normal:  <150 mg/dLBorderline High:  150 - 199 mg/dL   HDL 08/07/2022 52.60  >39.00 mg/dL Final   VLDL 08/07/2022 20.0  0.0 - 40.0 mg/dL Final   LDL Cholesterol 08/07/2022 45  0 - 99 mg/dL Final   Total CHOL/HDL Ratio 08/07/2022 2   Final                  Men          Women1/2 Average Risk     3.4          3.3Average Risk          5.0          4.42X Average Risk          9.6          7.13X Average Risk          15.0          11.0                       NonHDL 08/07/2022 64.68   Final   NOTE:  Non-HDL goal should be 30 mg/dL higher than patient's LDL goal (i.e. LDL goal of < 70 mg/dL, would have non-HDL goal of < 100 mg/dL)   Sodium 08/07/2022 137  135 - 145 mEq/L Final   Potassium 08/07/2022 4.4  3.5 - 5.1 mEq/L Final   Chloride 08/07/2022 107  96 - 112 mEq/L Final   CO2 08/07/2022 24  19 - 32 mEq/L Final   Glucose, Bld 08/07/2022 146 (H)  70 - 99 mg/dL Final   BUN 08/07/2022 28 (H)  6 - 23 mg/dL Final   Creatinine, Ser 08/07/2022 1.43 (H)  0.40 - 1.20 mg/dL Final   Total Bilirubin 08/07/2022 0.6  0.2 - 1.2 mg/dL Final   Alkaline Phosphatase 08/07/2022 46  39 - 117 U/L Final   AST 08/07/2022 22  0 - 37 U/L Final   ALT 08/07/2022 15  0 - 35 U/L Final   Total Protein 08/07/2022 7.2  6.0 - 8.3 g/dL Final   Albumin 08/07/2022 4.1  3.5 - 5.2 g/dL Final   GFR 08/07/2022 35.91 (  L)  >60.00 mL/min Final   Calculated using the CKD-EPI Creatinine Equation (2021)   Calcium 08/07/2022 9.6  8.4 - 10.5 mg/dL Final   Hgb A1c MFr Bld 08/07/2022 8.8 (H)  4.6 - 6.5 % Final   Glycemic Control Guidelines for People with Diabetes:Non Diabetic:  <6%Goal of Therapy: <7%Additional Action Suggested:  >8%     Allergies as of 08/10/2022   No Known Allergies      Medication List        Accurate as of August 10, 2022  9:09 AM. If you have any questions, ask your nurse or doctor.          amLODipine 5 MG  tablet Commonly known as: NORVASC Take 1 tablet (5 mg total) by mouth daily.   aspirin EC 81 MG tablet Take 1 tablet (81 mg total) by mouth daily. Swallow whole.   calcium carbonate 1500 (600 Ca) MG Tabs tablet Commonly known as: OSCAL Take 1 tablet by mouth 2 (two) times daily with a meal.   cloNIDine 0.1 MG tablet Commonly known as: CATAPRES Take 1 tablet (0.1 mg total) by mouth 2 (two) times daily.   ezetimibe 10 MG tablet Commonly known as: ZETIA Take 1 tablet by mouth once daily   FreeStyle Libre 2 Sensor Misc CHANGE EVERY 14 DAYS   furosemide 20 MG tablet Commonly known as: LASIX Take MWF   gabapentin 100 MG capsule Commonly known as: NEURONTIN Take 1 capsule (100 mg total) by mouth 3 (three) times daily.   glucose blood test strip Use as instructed to test blood sugar 3 times daily E11.65   insulin glargine-yfgn 100 UNIT/ML Pen Commonly known as: SEMGLEE INJECT 16 UNITS SUBCUTANEOUSLY ONCE DAILY   IRON PO Take by mouth.   losartan 100 MG tablet Commonly known as: COZAAR Take 0.5 tablets (50 mg total) by mouth daily.   metoprolol tartrate 25 MG tablet Commonly known as: LOPRESSOR Take 1 tablet by mouth twice daily   NovoLOG FlexPen 100 UNIT/ML FlexPen Generic drug: insulin aspart 6 UNITS BEFORE Breakfast on exercise days and if eating eggs 8 Units on other days LUNCH COVERAGE: NOVOLOG 8 UNITS if eating at home and 10 units for fast food Dinner 6 units Novolog before eating   Ozempic (0.25 or 0.5 MG/DOSE) 2 MG/1.5ML Sopn Generic drug: Semaglutide(0.25 or 0.'5MG'$ /DOS) Inject 0.5 mg into the skin once a week. 0.'25mg'$  weekly for 4 weeks then 0.'5mg'$  weekly   pioglitazone 15 MG tablet Commonly known as: ACTOS Take 1 tablet by mouth once daily   rosuvastatin 20 MG tablet Commonly known as: CRESTOR Take 1 tablet by mouth once daily        Allergies: No Known Allergies  Past Medical History:  Diagnosis Date   Chest pain 11/30/2020   Coronary artery  disease    Diabetes mellitus without complication (HCC)    Glaucoma    History of chicken pox    Hypertension     Past Surgical History:  Procedure Laterality Date   BREAST BIOPSY Right 2017   benign   CARDIAC CATHETERIZATION     CATARACT EXTRACTION, BILATERAL Bilateral    CESAREAN SECTION     COLONOSCOPY  1980's   CORONARY ARTERY BYPASS GRAFT N/A 12/05/2020   Procedure: CORONARY ARTERY BYPASS GRAFTING (CABG), ON PUMP, TIMES FOUR, USING LEFT INTERNAL MAMMARY ARTERY AND BILATERAL ENDOSCOPICALLY HARVESTED GREATER SAPHENOUS VEINS;  Surgeon: [name], MD;  Location: [facility];  Service: Open Heart Surgery;  Laterality: N/A;  LEFT HEART CATH AND CORONARY ANGIOGRAPHY N/A 11/30/2020   Procedure: LEFT HEART CATH AND CORONARY ANGIOGRAPHY;  Surgeon: Belva Crome, MD;  Location: [facility];  Service: Cardiovascular;  Laterality: N/A;   TEE WITHOUT CARDIOVERSION N/A 12/05/2020   Procedure: TRANSESOPHAGEAL ECHOCARDIOGRAM (TEE);  Surgeon: [name], MD;  Location: [facility];  Service: Open Heart Surgery;  Laterality: N/A;   TUBAL LIGATION      Family History  Problem Relation Age of Onset   Hypertension Mother    Diabetes Brother    CAD Brother    Hypertension Brother    Diabetes Brother    Hypertension Brother    Colon cancer Neg Hx    Esophageal cancer Neg Hx    Pancreatic cancer Neg Hx    Liver disease Neg Hx    Stomach cancer Neg Hx    Rectal cancer Neg Hx    Colon polyps Neg Hx     Social History:  reports that she has never smoked. She has never used smokeless tobacco. She reports that she does not drink alcohol and does not use drugs.   Review of Systems   Lipid history: Currently on Crestor 20 mg Previously had not been taking it regularly with LDL as high as 226 She is also on Zetia  LDL recently below 70  Lab Results  Component Value Date   CHOL 117 08/07/2022   CHOL 128 05/02/2021   CHOL 163 03/03/2021   Lab Results  Component Value Date    HDL 52.60 08/07/2022   HDL 58.60 05/02/2021   HDL 66.80 03/03/2021   Lab Results  Component Value Date   LDLCALC 45 08/07/2022   LDLCALC 56 05/02/2021   LDLCALC 77 03/03/2021   Lab Results  Component Value Date   TRIG 100.0 08/07/2022   TRIG 64.0 05/02/2021   TRIG 97.0 03/03/2021   Lab Results  Component Value Date   CHOLHDL 2 08/07/2022   CHOLHDL 2 05/02/2021   CHOLHDL 2 03/03/2021   Lab Results  Component Value Date   LDLDIRECT 33.0 01/23/2022            Hypertension: Has been present for several years, previously on amlodipine and HCTZ  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 She is taking amlodipine and losartan 100 mg  She her blood pressure was 160 at home   BP Readings from Last 3 Encounters:  08/10/22 (!) 220/78  06/27/22 134/62  06/13/22 (!) 143/67   CKD: Her nephrologist saw her in 1/23 and recommended Farxiga but cannot afford any SGLT2 drugs Etiology apparently diabetes and hypertension  No history of microalbuminuria  She was given Lasix for her edema previously  Lab Results  Component Value Date   CREATININE 1.43 (H) 08/07/2022   CREATININE 1.68 (H) 05/04/2022   CREATININE 1.7 (A) 02/23/2022    Most recent eye exam was in 9/22  Most recent foot exam: 10/22  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  LABS:  Lab on 08/07/2022  Component Date Value Ref Range Status   Cholesterol 08/07/2022 117  0 - 200 mg/dL Final   ATP III Classification       Desirable:  < 200 mg/dL               Borderline High:  200 - 239 mg/dL          High:  > = 240 mg/dL   Triglycerides 08/07/2022 100.0  0.0 - 149.0 mg/dL Final  Normal:  <150 mg/dLBorderline High:  150 - 199 mg/dL   HDL 08/07/2022 52.60  >39.00 mg/dL Final   VLDL 08/07/2022 20.0  0.0 - 40.0 mg/dL Final   LDL Cholesterol 08/07/2022 45  0 - 99 mg/dL Final   Total CHOL/HDL Ratio 08/07/2022 2   Final                  Men          Women1/2 Average Risk     3.4           3.3Average Risk          5.0          4.42X Average Risk          9.6          7.13X Average Risk          15.0          11.0                       NonHDL 08/07/2022 64.68   Final   NOTE:  Non-HDL goal should be 30 mg/dL higher than patient's LDL goal (i.e. LDL goal of < 70 mg/dL, would have non-HDL goal of < 100 mg/dL)   Sodium 08/07/2022 137  135 - 145 mEq/L Final   Potassium 08/07/2022 4.4  3.5 - 5.1 mEq/L Final   Chloride 08/07/2022 107  96 - 112 mEq/L Final   CO2 08/07/2022 24  19 - 32 mEq/L Final   Glucose, Bld 08/07/2022 146 (H)  70 - 99 mg/dL Final   BUN 08/07/2022 28 (H)  6 - 23 mg/dL Final   Creatinine, Ser 08/07/2022 1.43 (H)  0.40 - 1.20 mg/dL Final   Total Bilirubin 08/07/2022 0.6  0.2 - 1.2 mg/dL Final   Alkaline Phosphatase 08/07/2022 46  39 - 117 U/L Final   AST 08/07/2022 22  0 - 37 U/L Final   ALT 08/07/2022 15  0 - 35 U/L Final   Total Protein 08/07/2022 7.2  6.0 - 8.3 g/dL Final   Albumin 08/07/2022 4.1  3.5 - 5.2 g/dL Final   GFR 08/07/2022 35.91 (L)  >60.00 mL/min Final   Calculated using the CKD-EPI Creatinine Equation (2021)   Calcium 08/07/2022 9.6  8.4 - 10.5 mg/dL Final   Hgb A1c MFr Bld 08/07/2022 8.8 (H)  4.6 - 6.5 % Final   Glycemic Control Guidelines for People with Diabetes:Non Diabetic:  <6%Goal of Therapy: <7%Additional Action Suggested:  >8%     Physical Examination:  BP (!) 220/78   Pulse 72   Ht '4\' 11"'$  (1.499 m)   Wt 111 lb (50.3 kg)   SpO2 98%   BMI 22.42 kg/m    ASSESSMENT:  Diabetes type 2, insulin-dependent    A1c is 7 compared to 9.9 and previously persistently high  See history of present illness for discussion of current diabetes management, blood sugar patterns and problems identified  She is on basal bolus insulin, Actos 15 mg  She is doing better with Ozempic, currently taking this with a sample but cannot afford it and still waiting for patient assistance program Not able to explain why her blood sugars are so dramatically  improved but she is also appearing to get hypoglycemia after breakfast when she is taking 10 units despite getting some protein consistently  HYPERTENSION: Blood pressure is again high but she thinks her blood pressure was better when she was taking  clonidine which she ran out a week ago Also likely has much less edema now as judged by her weight   Renal dysfunction: Again variable and followed by nephrology  NEUROPATHY: She has been complaining of burning in her feet especially at night, she thinks she previously had benefited from gabapentin Discussed that this is likely to be from episodes of hyperglycemia  PLAN:   Continue Ozempic unchanged For now will not change her insulin regimen but discussed that she needs to reduce her lunchtime dose to 6 units for larger meals Also if she has a tendency to low sugars after breakfast she can reduce her NovoLog by 2 units We will continue the Lantus unchanged for now Reminded her to check her blood sugar 4 times a day with her sensor including after dinner to help identify her patterns better If she does have cereal in the mornings he will need to take 14 NovoLog instead of 10  For hypertension she will go back to clonidine and not run out She will also continue to follow-up with her other specialists including cardiologist  Restart gabapentin 100 mg at bedtime  There are no Patient Instructions on file for this visit.     Elayne Snare 08/10/2022, 9:09 AM   Note: This office note was prepared with Dragon voice recognition system technology. Any transcriptional errors that result from this process are unintentional.

## 2022-08-14 ENCOUNTER — Encounter: Payer: Self-pay | Admitting: Endocrinology

## 2022-08-14 ENCOUNTER — Telehealth: Payer: Self-pay

## 2022-08-14 NOTE — Telephone Encounter (Signed)
Patient called in left voicemail requesting for a callback. I tried calling patient twice and received busy signal.

## 2022-09-04 ENCOUNTER — Telehealth: Payer: Self-pay

## 2022-09-04 NOTE — Telephone Encounter (Signed)
Pt contacted and advised Patient Assistance Ozempic (2 boxes) have been delivered and ready for pick up.

## 2022-09-06 NOTE — Telephone Encounter (Signed)
Patient picked up patient assistance - Ozempic - log noted

## 2022-09-25 ENCOUNTER — Other Ambulatory Visit: Payer: Self-pay | Admitting: Endocrinology

## 2022-10-04 ENCOUNTER — Other Ambulatory Visit: Payer: Self-pay | Admitting: Internal Medicine

## 2022-10-04 DIAGNOSIS — I10 Essential (primary) hypertension: Secondary | ICD-10-CM

## 2022-10-06 ENCOUNTER — Other Ambulatory Visit: Payer: Self-pay | Admitting: Endocrinology

## 2022-10-10 ENCOUNTER — Telehealth: Payer: Self-pay

## 2022-10-10 NOTE — Telephone Encounter (Signed)
Pharmacy sent fax stating they filled lantus instead of semeglee because of insurance preference.

## 2022-10-11 ENCOUNTER — Ambulatory Visit: Payer: Medicare Other | Admitting: Internal Medicine

## 2022-10-25 ENCOUNTER — Ambulatory Visit (INDEPENDENT_AMBULATORY_CARE_PROVIDER_SITE_OTHER): Payer: Medicare Other | Admitting: Internal Medicine

## 2022-10-25 ENCOUNTER — Encounter: Payer: Self-pay | Admitting: Internal Medicine

## 2022-10-25 VITALS — BP 183/84 | HR 59 | Temp 98.1°F | Wt 114.5 lb

## 2022-10-25 DIAGNOSIS — Z951 Presence of aortocoronary bypass graft: Secondary | ICD-10-CM | POA: Diagnosis not present

## 2022-10-25 DIAGNOSIS — E782 Mixed hyperlipidemia: Secondary | ICD-10-CM

## 2022-10-25 DIAGNOSIS — I214 Non-ST elevation (NSTEMI) myocardial infarction: Secondary | ICD-10-CM

## 2022-10-25 DIAGNOSIS — N1832 Chronic kidney disease, stage 3b: Secondary | ICD-10-CM

## 2022-10-25 DIAGNOSIS — E1122 Type 2 diabetes mellitus with diabetic chronic kidney disease: Secondary | ICD-10-CM | POA: Diagnosis not present

## 2022-10-25 DIAGNOSIS — I10 Essential (primary) hypertension: Secondary | ICD-10-CM

## 2022-10-25 DIAGNOSIS — Z794 Long term (current) use of insulin: Secondary | ICD-10-CM | POA: Diagnosis not present

## 2022-10-25 LAB — POCT GLYCOSYLATED HEMOGLOBIN (HGB A1C): Hemoglobin A1C: 8.1 % — AB (ref 4.0–5.6)

## 2022-10-25 NOTE — Progress Notes (Signed)
Established Patient Office Visit     CC/Reason for Visit: Follow-up chronic conditions  HPI: Mariah Peterson is a 77 y.o. female who is coming in today for the above mentioned reasons. Past Medical History is significant for: Hypertension, hyperlipidemia, insulin-dependent diabetes followed by Dr. Dwyane Dee.  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  She is followed by thoracic surgeon Dr. Kipp Brood, her cardiologist is Dr. Marlou Porch.  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  She has no concerns or complaints today.  Has not been checking blood pressure regularly.  Has no scheduled follow-up with cardiology or endocrinology.   Past Medical/Surgical History: Past Medical History:  Diagnosis Date   Chest pain 11/30/2020   Coronary artery disease    Diabetes mellitus without complication (HCC)    Glaucoma    History of chicken pox    Hypertension     Past Surgical History:  Procedure Laterality Date   BREAST BIOPSY Right 2017   benign   CARDIAC CATHETERIZATION     CATARACT EXTRACTION, BILATERAL Bilateral    CESAREAN SECTION     COLONOSCOPY  1980's   CORONARY ARTERY BYPASS GRAFT N/A 12/05/2020   Procedure: CORONARY ARTERY BYPASS GRAFTING (CABG), ON PUMP, TIMES FOUR, USING LEFT INTERNAL MAMMARY ARTERY AND BILATERAL ENDOSCOPICALLY HARVESTED GREATER SAPHENOUS VEINS;  Surgeon: [name], MD;  Location: [facility];  Service: Open Heart Surgery;  Laterality: N/A;   LEFT HEART CATH AND CORONARY ANGIOGRAPHY N/A 11/30/2020   Procedure: LEFT HEART CATH AND CORONARY ANGIOGRAPHY;  Surgeon: Belva Crome, MD;  Location: [facility];  Service: Cardiovascular;  Laterality: N/A;   TEE WITHOUT CARDIOVERSION N/A 12/05/2020   Procedure: TRANSESOPHAGEAL ECHOCARDIOGRAM (TEE);  Surgeon: [name], MD;  Location: [facility];  Service: Open Heart Surgery;  Laterality: N/A;    TUBAL LIGATION      Social History:  reports that she has never smoked. She has never used smokeless tobacco. She reports that she does not drink alcohol and does not use drugs.  Allergies: No Known Allergies  Family History:  Family History  Problem Relation Age of Onset   Hypertension Mother    Diabetes Brother    CAD Brother    Hypertension Brother    Diabetes Brother    Hypertension Brother    Colon cancer Neg Hx    Esophageal cancer Neg Hx    Pancreatic cancer Neg Hx    Liver disease Neg Hx    Stomach cancer Neg Hx    Rectal cancer Neg Hx    Colon polyps Neg Hx      Current Outpatient Medications:    amLODipine (NORVASC) 5 MG tablet, Take 1 tablet (5 mg total) by mouth daily., Disp: 90 tablet, Rfl: 1   aspirin EC 81 MG tablet, Take 1 tablet (81 mg total) by mouth daily. Swallow whole., Disp: 90 tablet, Rfl: 3   calcium carbonate (OSCAL) 1500 (600 Ca) MG TABS tablet, Take 1 tablet by mouth 2 (two) times daily with a meal., Disp: , Rfl:    cloNIDine (CATAPRES - DOSED IN MG/24 HR) 0.1 mg/24hr patch, Place 1 patch (0.1 mg total) onto the skin once a week., Disp: 12 patch, Rfl: 5   cloNIDine (CATAPRES) 0.1 MG tablet, Take 1 tablet (0.1 mg total) by mouth 2 (two) times daily., Disp: 60 tablet, Rfl: 5  Continuous Blood Gluc Sensor (FREESTYLE LIBRE 2 SENSOR) MISC, CHANGE EVERY 14 DAYS, Disp: 2 each, Rfl: 0   empagliflozin (JARDIANCE) 10 MG TABS tablet, Take 1 tablet (10 mg total) by mouth daily with breakfast., Disp: 30 tablet, Rfl: 3   ezetimibe (ZETIA) 10 MG tablet, Take 1 tablet by mouth once daily, Disp: 90 tablet, Rfl: 0   Ferrous Sulfate (IRON PO), Take by mouth., Disp: , Rfl:    furosemide (LASIX) 20 MG tablet, Take MWF, Disp: 30 tablet, Rfl: 0   gabapentin (NEURONTIN) 100 MG capsule, Take 1 capsule (100 mg total) by mouth 3 (three) times daily., Disp: 90 capsule, Rfl: 3   glucose blood test strip, Use as instructed to test blood sugar 3 times daily E11.65, Disp: 270  each, Rfl: 1   insulin aspart (NOVOLOG FLEXPEN) 100 UNIT/ML FlexPen, 6 UNITS BEFORE Breakfast on exercise days and if eating eggs 8 Units on other days LUNCH COVERAGE: NOVOLOG 8 UNITS if eating at home and 10 units for fast food Dinner 6 units Novolog before eating, Disp: 15 mL, Rfl: 3   insulin glargine-yfgn (SEMGLEE, YFGN,) 100 UNIT/ML Pen, INJECT 16 UNITS SUBCUTANEOUSLY ONCE DAILY, Disp: 15 mL, Rfl: 2   losartan (COZAAR) 100 MG tablet, Take 1 tablet by mouth once daily, Disp: 90 tablet, Rfl: 1   metoprolol tartrate (LOPRESSOR) 25 MG tablet, Take 1 tablet by mouth twice daily, Disp: 90 tablet, Rfl: 1   pioglitazone (ACTOS) 15 MG tablet, Take 1 tablet by mouth once daily, Disp: 30 tablet, Rfl: 2   rosuvastatin (CRESTOR) 20 MG tablet, Take 1 tablet by mouth once daily, Disp: 30 tablet, Rfl: 0   Semaglutide,0.25 or 0.'5MG'$ /DOS, (OZEMPIC, 0.25 OR 0.5 MG/DOSE,) 2 MG/1.5ML SOPN, Inject 0.5 mg into the skin once a week. 0.'25mg'$  weekly for 4 weeks then 0.'5mg'$  weekly, Disp: 1.5 mL, Rfl: 2  Current Facility-Administered Medications:    diclofenac Sodium (VOLTAREN) 1 % topical gel 2 g, 2 g, Topical, TID, Lars Pinks M, PA-C  Review of Systems:  Constitutional: Denies fever, chills, diaphoresis, appetite change and fatigue.  HEENT: Denies photophobia, eye pain, redness, hearing loss, ear pain, congestion, sore throat, rhinorrhea, sneezing, mouth sores, trouble swallowing, neck pain, neck stiffness and tinnitus.   Respiratory: Denies SOB, DOE, cough, chest tightness,  and wheezing.   Cardiovascular: Denies chest pain, palpitations and leg swelling.  Gastrointestinal: Denies nausea, vomiting, abdominal pain, diarrhea, constipation, blood in stool and abdominal distention.  Genitourinary: Denies dysuria, urgency, frequency, hematuria, flank pain and difficulty urinating.  Endocrine: Denies: hot or cold intolerance, sweats, changes in hair or nails, polyuria, polydipsia. Musculoskeletal: Denies myalgias,  back pain, joint swelling, arthralgias and gait problem.  Skin: Denies pallor, rash and wound.  Neurological: Denies dizziness, seizures, syncope, weakness, light-headedness, numbness and headaches.  Hematological: Denies adenopathy. Easy bruising, personal or family bleeding history  Psychiatric/Behavioral: Denies suicidal ideation, mood changes, confusion, nervousness, sleep disturbance and agitation    Physical Exam: Vitals:   10/25/22 1018 10/25/22 1022  BP: (!) 180/80 (!) 183/84  Pulse: (!) 59   Temp: 98.1 F (36.7 C)   TempSrc: Oral   SpO2: 98%   Weight: 114 lb 8 oz (51.9 kg)     Body mass index is 23.13 kg/m.   Constitutional: NAD, calm, comfortable Eyes: PERRL, lids and conjunctivae normal ENMT: Mucous membranes are moist. Respiratory: clear to auscultation bilaterally, no wheezing, no crackles. Normal respiratory effort. No accessory muscle use.  Cardiovascular: Regular rate and rhythm, no murmurs / rubs /  gallops. No extremity edema.   Psychiatric: Normal judgment and insight. Alert and oriented x 3. Normal mood.    Impression and Plan:  Type 2 diabetes mellitus with stage 3b chronic kidney disease, with long-term current use of insulin ([place]) - Plan: POCT glycosylated hemoglobin (Hb A1C), AMB Referral to Chronic Care Management Services  Mixed hyperlipidemia  Primary hypertension  NSTEMI (non-ST elevated myocardial infarction) ([place])  S/P CABG x 4  -Her medical issues remain uncontrolled.  Her A1c today is 8.1 and her blood pressure is markedly elevated. -I will place referral to chronic care management to assist, I will also request pharmacy consultation as she has difficulty affording Jardiance and to make sure that she is adherent due to her polypharmacy. -I have asked that she call Dr. Marlou Porch today to schedule prompt follow-up to manage blood pressure.  She states she is compliant with amlodipine 5 mg, clonidine 0.1 mg twice daily, losartan 50 mg daily and  metoprolol 25 mg twice daily. -Her diabetes is followed by Dr. Dwyane Dee.  Time spent:33 minutes reviewing chart, interviewing and examining patient and formulating plan of care.      Lelon Frohlich, MD [facility] at [facility]

## 2022-10-29 ENCOUNTER — Other Ambulatory Visit: Payer: Self-pay | Admitting: Endocrinology

## 2022-11-14 ENCOUNTER — Telehealth: Payer: Self-pay

## 2022-11-14 NOTE — Progress Notes (Unsigned)
  Chronic Care Management   Note  11/14/2022 Name: Mariah Peterson MRN: [id number] DOB: 1946-01-13  Mariah Peterson is a 77 y.o. year old female who is a primary care patient of Isaac Bliss, Rayford Halsted, MD. I reached out to [name] by phone today in response to a referral sent by [name]'s PCP.  The first contact attempt was unsuccessful.   Follow up plan: Additional outreach attempts will be made.  [name], [place], [place] [address] Direct Dial: [phone] [email or web address]

## 2022-11-19 NOTE — Progress Notes (Unsigned)
  Chronic Care Management   Note  11/19/2022 Name: Mariah Peterson MRN: [id number] DOB: 1946-05-16  Mariah Peterson is a 77 y.o. year old female who is a primary care patient of Isaac Bliss, Rayford Halsted, MD. I reached out to [name] by phone today in response to a referral sent by Ms. Vergia N Bellucci's PCP.  The second contact attempt was unsuccessful.   Follow up plan: Additional outreach attempts will be made.  [name], [place], [place] [address] Direct Dial: [phone] [email or web address]

## 2022-11-21 NOTE — Progress Notes (Signed)
  Chronic Care Management   Note  11/21/2022 Name: Mariah Peterson MRN: [id number] DOB: May 05, 1946  [name] is a 77 y.o. year old female who is a primary care patient of Isaac Bliss, Rayford Halsted, MD. I reached out to [name] by phone today in response to a referral sent by [name]'s PCP.  The third contact attempt was unsuccessful.   Follow up plan: Unable to reach patient after 3 attempts. No further outreach attempts will be made pending additional provider engagement, patient request, or new provider order.   [name], [place], [place] [address] Direct Dial: [phone] [email or web address]

## 2022-11-23 ENCOUNTER — Other Ambulatory Visit (INDEPENDENT_AMBULATORY_CARE_PROVIDER_SITE_OTHER): Payer: Medicare Other

## 2022-11-23 ENCOUNTER — Telehealth: Payer: Self-pay

## 2022-11-23 DIAGNOSIS — E1165 Type 2 diabetes mellitus with hyperglycemia: Secondary | ICD-10-CM

## 2022-11-23 DIAGNOSIS — Z794 Long term (current) use of insulin: Secondary | ICD-10-CM

## 2022-11-23 LAB — GLUCOSE, RANDOM: Glucose, Bld: 41 mg/dL — CL (ref 70–99)

## 2022-11-23 LAB — HEMOGLOBIN A1C: Hgb A1c MFr Bld: 9.5 % — ABNORMAL HIGH (ref 4.6–6.5)

## 2022-11-23 NOTE — Telephone Encounter (Signed)
Critical lab received from main lab to [facility]. Critical glucose of 41.

## 2022-11-23 NOTE — Telephone Encounter (Signed)
Pt. appeared to be doing well during the time of the appointment.   Will forward this to Dr. Dwyane Dee.

## 2022-11-26 NOTE — Telephone Encounter (Signed)
Pt informed, message sent in la result notes.

## 2022-11-28 ENCOUNTER — Encounter: Payer: Self-pay | Admitting: Endocrinology

## 2022-11-28 ENCOUNTER — Ambulatory Visit (INDEPENDENT_AMBULATORY_CARE_PROVIDER_SITE_OTHER): Payer: Medicare Other | Admitting: Endocrinology

## 2022-11-28 VITALS — BP 226/84 | HR 81 | Ht 59.0 in | Wt 114.2 lb

## 2022-11-28 DIAGNOSIS — I10 Essential (primary) hypertension: Secondary | ICD-10-CM

## 2022-11-28 DIAGNOSIS — E1122 Type 2 diabetes mellitus with diabetic chronic kidney disease: Secondary | ICD-10-CM

## 2022-11-28 DIAGNOSIS — N183 Chronic kidney disease, stage 3 unspecified: Secondary | ICD-10-CM

## 2022-11-28 DIAGNOSIS — Z794 Long term (current) use of insulin: Secondary | ICD-10-CM | POA: Diagnosis not present

## 2022-11-28 DIAGNOSIS — E1165 Type 2 diabetes mellitus with hyperglycemia: Secondary | ICD-10-CM | POA: Diagnosis not present

## 2022-11-28 MED ORDER — AMLODIPINE BESYLATE 10 MG PO TABS: 10.0000 mg | ORAL_TABLET | Freq: Every day | ORAL | 0 refills | Status: AC

## 2022-11-28 NOTE — Patient Instructions (Addendum)
Try Patches for clonidine: if BP  > 160 after 1 weekly apply 2 patches  Novolog 8 before Bfst, 6 lunch and 4-6 before pm snack  Check blood sugars on waking up  Also check blood sugars about 2 hours after meals and do this after different meals by rotation  Recommended blood sugar levels on waking up are 90-130 and about 2 hours after meal is 130-160  Please bring your blood sugar monitor to each visit, thank you

## 2022-11-28 NOTE — Progress Notes (Signed)
Patient ID: Mariah Peterson, female   DOB: 14-Sep-1946, 77 y.o.   MRN: [id number]          Reason for Appointment: Follow-up    History of Present Illness:          Date of diagnosis of type 2 diabetes mellitus:?  1990       Background history:   She was started on insulin about 4 years ago and previously taking metformin and glipizide No detailed history is available and she does not think she has taken any other types of diabetes medications or injections  Recent history:    INSULIN regimen is: Lantus 12 units daily in am, Novolog 10 units after breakfast, 6 units at lunch and none at supper   Non-insulin hypoglycemic drugs the patient is taking are:   Ozempic 0.25 mg weekly  Current management, blood sugar patterns and problems identified:   Her A1c is 9.5 and higher  Last fructosamine 337  Interpretation of her freestyle libre download between 7/11 and today as follows: Her freestyle Elenor Legato data is again incomplete with only 39 % active CGM time, despite repeated reminders she only checks blood sugars mostly once a day and sensor data is not complete in the afternoons and evenings Recent average blood sugar is 168 Again her A1c is higher than expected She now reveals that she is taking her morning NovoLog an hour after eating breakfast This is causing usually very high readings after breakfast and tendency to low sugars midday at times including in the lab Also even though she is eating only peanut butter crackers or snack instead of dinner her blood sugars are appearing to be relatively higher late in the evening when she does not check her readings She again has forgotten to check her sugars more than once a day and rarely has diarrhea in the afternoon or evening However her blood sugars after meals are fluctuating significantly This is despite taking her Ozempic regularly Overnight readings are somewhat variable but generally controlled   Breakfast usually 9 am, lunch  1 PM, supper 6 PM    Glucose sensor: Freestyle libre   Interpretation of her [employer] for the last 2 weeks:  Blood sugar data is not available in the afternoons and evenings between about 12 PM through 3 AM OVERNIGHT blood sugars are analyzed only after 2-3 AM when they appear to be generally higher and decreasing early morning but subsequently rising after 6 AM Postprandial readings after her breakfast are mostly high occasionally nearly 350 Blood sugars after lunch are not consistently evaluated but appear variable Glucose is usually low normal and occasionally low midday Blood sugars after dinner are not evaluated but bedtime readings may be well above 180 target Usually no hypoglycemia overnight  Recent data shows 52% readings within target, 5% low and average 168:  Previously:  CGM use % of time 40  2-week average/GV 172/35   Time in range     66%  % Time Above 180 18  % Time above 250 15  % Time Below 70 1     Previous data:  CGM use % of time 59  2-week average/GV 167  Time in range     64, was 46   %  % Time Above 180 28+7  % Time above 250   % Time Below 70 1     PRE-MEAL Fasting Lunch Dinner Bedtime Overall  Glucose range:       Averages: 139  196    POST-MEAL PC Breakfast PC Lunch PC Dinner  Glucose range:     Averages: 207 204 218     She was seen by diabetes educator in 9/19       Side effects from medications have been: None  Compliance with the medical regimen: Fair  Typical meal intake: Breakfast is oatmeal, otherwise eggs and toast.  Lunch may be rice and beans or a sandwich at 4 pm Fruit for snacks                Dietician visit, most recent: 09/2018 CDE consultation: 07/01/2018  Weight history:  Wt Readings from Last 3 Encounters:  11/28/22 114 lb 3.2 oz (51.8 kg)  10/25/22 114 lb 8 oz (51.9 kg)  08/10/22 111 lb (50.3 kg)    Glycemic control:   Lab Results  Component Value Date   HGBA1C 9.5 (H) 11/23/2022   HGBA1C 8.1  (A) 10/25/2022   HGBA1C 8.8 (H) 08/07/2022   Lab Results  Component Value Date   MICROALBUR 16.1 (H) 11/28/2021   LDLCALC 45 08/07/2022   CREATININE 1.43 (H) 08/07/2022   Lab Results  Component Value Date   MICRALBCREAT 51.5 (H) 11/28/2021    Lab Results  Component Value Date   FRUCTOSAMINE 346 (H) 05/04/2022   FRUCTOSAMINE 337 (H) 01/23/2022   FRUCTOSAMINE 435 (H) 09/25/2021    Lab on 11/23/2022  Component Date Value Ref Range Status   Glucose, Bld 11/23/2022 41 (LL)  70 - 99 mg/dL Final   Hgb A1c MFr Bld 11/23/2022 9.5 (H)  4.6 - 6.5 % Final   Glycemic Control Guidelines for People with Diabetes:Non Diabetic:  <6%Goal of Therapy: <7%Additional Action Suggested:  >8%     Allergies as of 11/28/2022   No Known Allergies      Medication List        Accurate as of November 28, 2022  9:59 AM. If you have any questions, ask your nurse or doctor.          STOP taking these medications    cloNIDine 0.1 MG tablet Commonly known as: CATAPRES Stopped by: Elayne Snare, MD       TAKE these medications    amLODipine 10 MG tablet Commonly known as: NORVASC Take 1 tablet (10 mg total) by mouth daily. What changed:  medication strength how much to take Changed by: Elayne Snare, MD   aspirin EC 81 MG tablet Take 1 tablet (81 mg total) by mouth daily. Swallow whole.   calcium carbonate 1500 (600 Ca) MG Tabs tablet Commonly known as: OSCAL Take 1 tablet by mouth 2 (two) times daily with a meal.   cloNIDine 0.1 mg/24hr patch Commonly known as: CATAPRES - Dosed in mg/24 hr Place 1 patch (0.1 mg total) onto the skin once a week.   empagliflozin 10 MG Tabs tablet Commonly known as: Jardiance Take 1 tablet (10 mg total) by mouth daily with breakfast.   ezetimibe 10 MG tablet Commonly known as: ZETIA Take 1 tablet by mouth once daily   FreeStyle Libre 2 Sensor Misc CHANGE EVERY 14 DAYS   furosemide 20 MG tablet Commonly known as: LASIX Take MWF   gabapentin 100  MG capsule Commonly known as: NEURONTIN Take 1 capsule (100 mg total) by mouth 3 (three) times daily.   glucose blood test strip Use as instructed to test blood sugar 3 times daily E11.65   IRON PO Take by mouth.   losartan 100 MG tablet Commonly known  as: COZAAR Take 1 tablet by mouth once daily   metoprolol tartrate 25 MG tablet Commonly known as: LOPRESSOR Take 1 tablet by mouth twice daily   NovoLOG FlexPen 100 UNIT/ML FlexPen Generic drug: insulin aspart 6 UNITS BEFORE Breakfast on exercise days and if eating eggs 8 Units on other days LUNCH COVERAGE: NOVOLOG 8 UNITS if eating at home and 10 units for fast food Dinner 6 units Novolog before eating   Ozempic (0.25 or 0.5 MG/DOSE) 2 MG/1.5ML Sopn Generic drug: Semaglutide(0.25 or 0.'5MG'$ /DOS) Inject 0.5 mg into the skin once a week. 0.'25mg'$  weekly for 4 weeks then 0.'5mg'$  weekly   pioglitazone 15 MG tablet Commonly known as: ACTOS Take 1 tablet by mouth once daily   rosuvastatin 20 MG tablet Commonly known as: CRESTOR Take 1 tablet by mouth once daily   Semglee (yfgn) 100 UNIT/ML Pen Generic drug: insulin glargine-yfgn INJECT 16 UNITS SUBCUTANEOUSLY ONCE DAILY        Allergies: No Known Allergies  Past Medical History:  Diagnosis Date   Chest pain 11/30/2020   Coronary artery disease    Diabetes mellitus without complication (HCC)    Glaucoma    History of chicken pox    Hypertension     Past Surgical History:  Procedure Laterality Date   BREAST BIOPSY Right 2017   benign   CARDIAC CATHETERIZATION     CATARACT EXTRACTION, BILATERAL Bilateral    CESAREAN SECTION     COLONOSCOPY  1980's   CORONARY ARTERY BYPASS GRAFT N/A 12/05/2020   Procedure: CORONARY ARTERY BYPASS GRAFTING (CABG), ON PUMP, TIMES FOUR, USING LEFT INTERNAL MAMMARY ARTERY AND BILATERAL ENDOSCOPICALLY HARVESTED GREATER SAPHENOUS VEINS;  Surgeon: [name], MD;  Location: [facility];  Service: Open Heart Surgery;  Laterality: N/A;   LEFT  HEART CATH AND CORONARY ANGIOGRAPHY N/A 11/30/2020   Procedure: LEFT HEART CATH AND CORONARY ANGIOGRAPHY;  Surgeon: Belva Crome, MD;  Location: [facility];  Service: Cardiovascular;  Laterality: N/A;   TEE WITHOUT CARDIOVERSION N/A 12/05/2020   Procedure: TRANSESOPHAGEAL ECHOCARDIOGRAM (TEE);  Surgeon: [name], MD;  Location: [facility];  Service: Open Heart Surgery;  Laterality: N/A;   TUBAL LIGATION      Family History  Problem Relation Age of Onset   Hypertension Mother    Diabetes Brother    CAD Brother    Hypertension Brother    Diabetes Brother    Hypertension Brother    Colon cancer Neg Hx    Esophageal cancer Neg Hx    Pancreatic cancer Neg Hx    Liver disease Neg Hx    Stomach cancer Neg Hx    Rectal cancer Neg Hx    Colon polyps Neg Hx     Social History:  reports that she has never smoked. She has never used smokeless tobacco. She reports that she does not drink alcohol and does not use drugs.   Review of Systems   Lipid history: Currently on Crestor 20 mg Previously had not been taking it regularly with LDL as high as 226 She is also on Zetia  LDL below 70  Lab Results  Component Value Date   CHOL 117 08/07/2022   CHOL 128 05/02/2021   CHOL 163 03/03/2021   Lab Results  Component Value Date   HDL 52.60 08/07/2022   HDL 58.60 05/02/2021   HDL 66.80 03/03/2021   Lab Results  Component Value Date   LDLCALC 45 08/07/2022   [place] 56 05/02/2021  LDLCALC 77 03/03/2021   Lab Results  Component Value Date   TRIG 100.0 08/07/2022   TRIG 64.0 05/02/2021   TRIG 97.0 03/03/2021   Lab Results  Component Value Date   CHOLHDL 2 08/07/2022   CHOLHDL 2 05/02/2021   CHOLHDL 2 03/03/2021   Lab Results  Component Value Date   LDLDIRECT 33.0 01/23/2022            Hypertension: Has been present for several years, generally difficult to control  She is being prescribed amlodipine, clonidine and losartan 100 mg  She her blood pressure  was 180 at home recently and similar at her PCP office She was told to make an appointment with cardiologist but has not done so She was also on prescribed clonidine patch instead of a pill because she would have high readings in the morning but she has not started the patches yet Also appears not to have filled her prescription for amlodipine   BP Readings from Last 3 Encounters:  11/28/22 (!) 226/84  10/25/22 (!) 183/84  08/10/22 (!) 220/78   CKD: She is followed by nephrologist  Etiology appears to be diabetes and hypertension  No history of microalbuminuria  No recent labs available  Lab Results  Component Value Date   CREATININE 1.43 (H) 08/07/2022   CREATININE 1.68 (H) 05/04/2022   CREATININE 1.7 (A) 02/23/2022    Most recent eye exam was in 9/22  Most recent foot exam: 10/22   NEUROPATHY: She has been complaining of burning in her feet especially at night, better on gabapentin  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  LABS:  Lab on 11/23/2022  Component Date Value Ref Range Status   Glucose, Bld 11/23/2022 41 (LL)  70 - 99 mg/dL Final   Hgb A1c MFr Bld 11/23/2022 9.5 (H)  4.6 - 6.5 % Final   Glycemic Control Guidelines for People with Diabetes:Non Diabetic:  <6%Goal of Therapy: <7%Additional Action Suggested:  >8%     Physical Examination:  BP (!) 226/84 (BP Location: Left Arm, Patient Position: Sitting, Cuff Size: Normal)   Pulse 81   Ht '4\' 11"'$  (1.499 m)   Wt 114 lb 3.2 oz (51.8 kg)   SpO2 97%   BMI 23.07 kg/m    ASSESSMENT:  Diabetes type 2, insulin-dependent    A1c is 9.1, higher than usual  See history of present illness for discussion of current diabetes management, blood sugar patterns and problems identified  She is on basal bolus insulin, Actos 15 mg and low-dose Ozempic  Problems identified: Taking her morning NovoLog 1 hour after eating instead of before This is causing high readings after breakfast and tendency  to low sugars midday with occasional readings in the 40s High variability in her blood sugars after breakfast even though she is eating similar meals every morning  Appears to be having high readings late in the evening after her evening snack  Again overnight blood sugars are somewhat inconsistent Unable to verify her blood sugar readings after lunch because of lack of monitoring Needs more diabetes education  HYPERTENSION: Blood pressure is significantly higher This may be related to her not refilling her amlodipine Also likely needs a higher dose of clonidine or to take it 3 times a day She did not start the patch that was recommended   Renal dysfunction: Has been unable to get Jardiance or Wilder Glade because of cost Discussed that this will benefit her both with her renal function and diabetes   PLAN:  Continue Ozempic 0.25 mg weekly She needs to take at least 11 units of NovoLog at breakfast and 8 at lunch and dinner If her blood sugars are higher after lunch and dinner more than 180 she will need to add 2 units more Her husband will help her with this regimen She will ask her DME supplier about upgrading to the [place] 3 as she does not have a recent purchase of her meter Discussed need to check blood sugars 4 times a day She will continue the Lantus unchanged for now   For hypertension she will switch clonidine tablets to patches 0.1 mg weekly Also start amlodipine 10 mg daily which was prescribed today If her blood pressure is still over 160 after 1 week at home she will apply 2 patches at a time of clonidine and let us know  She will discuss her blood pressure management with nephrologist in about a month  Jardiance patient assistance program information given  Patient Instructions  Try Patches for clonidine: if BP  > 160 after 1 weekly apply 2 patches  Novolog 8 before Bfst, 6 lunch and 4-6 before pm snack  Check blood sugars on waking up  Also check blood sugars  about 2 hours after meals and do this after different meals by rotation  Recommended blood sugar levels on waking up are 90-130 and about 2 hours after meal is 130-160  Please bring your blood sugar monitor to each visit, thank you    Total visit time for evaluation and management of multiple problems and counseling = 40 minutes  Lucius Wise 11/28/2022, 9:59 AM   Note: This office note was prepared with Dragon voice recognition system technology. Any transcriptional errors that result from this process are unintentional.

## 2022-11-29 ENCOUNTER — Encounter: Payer: Self-pay | Admitting: Endocrinology

## 2022-12-05 ENCOUNTER — Telehealth: Payer: Self-pay | Admitting: Endocrinology

## 2022-12-05 NOTE — Telephone Encounter (Signed)
Noted and received

## 2022-12-05 NOTE — Telephone Encounter (Signed)
New message   The patient came by the office - paperwork needs to be filled out  [place] Patient Assistance Program.   Shari Heritage in MD folder

## 2022-12-12 LAB — HM DIABETES EYE EXAM

## 2022-12-18 ENCOUNTER — Telehealth: Payer: Self-pay | Admitting: Endocrinology

## 2022-12-18 NOTE — Telephone Encounter (Signed)
Tessa with [employer] is calling to let Dr. Dwyane Dee  and staff know that [employer] has attempted to reach out to patient on numerous occasions concerning her Dexcom, but they have been unable to rerach her due to the phone disconnecting.

## 2022-12-19 NOTE — Telephone Encounter (Signed)
Spoke with the patient and provided her with the companys number as well as the number provided by [facility]. Pt says that she will call both numbers to move through with processing her Dexcom.

## 2022-12-25 ENCOUNTER — Telehealth: Payer: Self-pay | Admitting: Internal Medicine

## 2022-12-25 ENCOUNTER — Other Ambulatory Visit: Payer: Self-pay | Admitting: Internal Medicine

## 2022-12-25 DIAGNOSIS — E782 Mixed hyperlipidemia: Secondary | ICD-10-CM

## 2022-12-25 DIAGNOSIS — I10 Essential (primary) hypertension: Secondary | ICD-10-CM

## 2022-12-25 DIAGNOSIS — Z794 Long term (current) use of insulin: Secondary | ICD-10-CM

## 2022-12-25 NOTE — Telephone Encounter (Signed)
Error

## 2022-12-26 NOTE — Telephone Encounter (Signed)
Attempted to contact the pt to inform her that we have a [place] receiver here and is ready for pick up.   Also provided [facility] with [employer] the serial number to the receiver.  Serial [id number]

## 2022-12-31 ENCOUNTER — Other Ambulatory Visit: Payer: Self-pay | Admitting: Endocrinology

## 2023-01-23 ENCOUNTER — Other Ambulatory Visit: Payer: Self-pay | Admitting: Internal Medicine

## 2023-01-23 DIAGNOSIS — I10 Essential (primary) hypertension: Secondary | ICD-10-CM

## 2023-01-28 ENCOUNTER — Other Ambulatory Visit: Payer: Medicare Other

## 2023-01-29 ENCOUNTER — Telehealth: Payer: Self-pay

## 2023-01-29 NOTE — Progress Notes (Signed)
Patient appearing on report for [employer] [facility] - Hypertension Control report due to last documented ambulatory blood pressure of 226/84 on 11/28/22. Next appointment with PCP was scheduled for 01/31/23, but patient had to cancel due to scheduling conflict.  Sees endocrinologist this day, though.   Outreached patient to discuss hypertension control and medication management in regard to other chronic disease states.  HTN Current antihypertensives: amlodipine 10mg  daily, furosemide 20mg  MWF, losartan 100mg  daily, metoprolol tartrate 25mg  BID -Patient has an automated upper arm home BP machine, but the batteries need to be replaced -Patient states she could not tolerate clonidine patches and switched back to tablets, but she was unable to provide dose and how she is taking these -Endorses only taking metoprolol tartrate once daily instead of twice daily   DM Current medications:  Novolog with meals, Glargine (Lantus) 16 units in the morning, Pioglitazone 15mg  daily, Ozempic 0.5mg  weekly -A1c 9.5 11/23/22 -Patient uses Freestyle Libre 2 for CGM and reports recent readings 160-190 and states these numbers are approximately 2 hours after eating -Endorses rare s/sx of hypoglycemia but knows when coming on and addresses appropriately -Received Libre at no cost and has patient assistance for [employer] -States application for [employer] PAP was denied for missing information, but she is unsure what was lacking -Endorses tolerating all medications well  HLD Current Medications: ezetimibe 10mg  daily -TC 117,LDL 45, TG 100, HDL 52 on 59/16/38 -Patient does not have rosuvastatin 20mg , nor has she been taking; appears 30 days with no addition refills were prescribed 10/30/22  Assessment/Plan: HTN - Currently uncontrolled - Recommend considering metoprolol succinate 25mg  daily for once daily dosing versus tartrate since she is not taking second dose - Pharmacy report indicates patient taking clonidine 0.1mg   twice daily as of 12/24/22- prescribed by Vallery Sa - Recommend daily monitoring and recording of home blood pressure  DM -Contacted BI, and PAP application for [place] is lacking household size/income information, patient signature, and legible date on page 1.  It is also missing provider phone number on page 4, which you cannot just provide over the phone.  The page will need to be resent.   -Contacting medication assistance team to see if just page 1 can be sent to the patient to complete and page 4 to the provider.  HLD -Recommending renewing prescription for rosuvastatin 20mg  daily, as lipids at goal with this and ezetimibe 10mg  on board  Follow-up:  None scheduled, but can follow up as needed per PCP/endocrinology  Lenna Gilford, PharmD, DPLA

## 2023-01-30 ENCOUNTER — Other Ambulatory Visit (HOSPITAL_COMMUNITY): Payer: Self-pay

## 2023-01-30 ENCOUNTER — Other Ambulatory Visit (INDEPENDENT_AMBULATORY_CARE_PROVIDER_SITE_OTHER): Payer: Medicare Other

## 2023-01-30 ENCOUNTER — Telehealth: Payer: Self-pay

## 2023-01-30 DIAGNOSIS — E1165 Type 2 diabetes mellitus with hyperglycemia: Secondary | ICD-10-CM | POA: Diagnosis not present

## 2023-01-30 DIAGNOSIS — Z794 Long term (current) use of insulin: Secondary | ICD-10-CM

## 2023-01-30 NOTE — Progress Notes (Signed)
   01/30/2023  Patient ID: Mariah Peterson, female   DOB: [birth year], 77 y.o.   MRN: [id number]  Outreach attempt to inform patient of missing information on her [employer] PAP application.  Medication Assistance team is going to restart the application process based on information lacking; this should be mailed out in the next 3-5 business days.  I was not able to reach the patient but did leave a voicemail for her to call me back at her convenience.  If I had not heard back, I will try to call again at the end of next week to see if she received the application and review what all is needed.  Lenna Gilford, PharmD, DPLA

## 2023-01-31 ENCOUNTER — Encounter (HOSPITAL_COMMUNITY): Payer: Self-pay

## 2023-01-31 ENCOUNTER — Emergency Department (HOSPITAL_COMMUNITY)
Admission: EM | Admit: 2023-01-31 | Discharge: 2023-02-01 | Disposition: A | Discharge status: Home / Self Care | Payer: Medicare Other | Attending: Emergency Medicine | Admitting: Emergency Medicine

## 2023-01-31 ENCOUNTER — Telehealth: Payer: Self-pay

## 2023-01-31 ENCOUNTER — Ambulatory Visit (INDEPENDENT_AMBULATORY_CARE_PROVIDER_SITE_OTHER): Payer: Medicare Other | Admitting: Endocrinology

## 2023-01-31 ENCOUNTER — Ambulatory Visit: Payer: Medicare Other | Admitting: Internal Medicine

## 2023-01-31 ENCOUNTER — Emergency Department (HOSPITAL_COMMUNITY): Payer: Medicare Other

## 2023-01-31 ENCOUNTER — Encounter: Payer: Self-pay | Admitting: Endocrinology

## 2023-01-31 ENCOUNTER — Other Ambulatory Visit: Payer: Self-pay | Admitting: Endocrinology

## 2023-01-31 VITALS — BP 142/80 | HR 70 | Ht 59.0 in | Wt 106.0 lb

## 2023-01-31 DIAGNOSIS — I10 Essential (primary) hypertension: Secondary | ICD-10-CM | POA: Diagnosis not present

## 2023-01-31 DIAGNOSIS — E7800 Pure hypercholesterolemia, unspecified: Secondary | ICD-10-CM

## 2023-01-31 DIAGNOSIS — Z794 Long term (current) use of insulin: Secondary | ICD-10-CM | POA: Insufficient documentation

## 2023-01-31 DIAGNOSIS — I2510 Atherosclerotic heart disease of native coronary artery without angina pectoris: Secondary | ICD-10-CM | POA: Insufficient documentation

## 2023-01-31 DIAGNOSIS — E1140 Type 2 diabetes mellitus with diabetic neuropathy, unspecified: Secondary | ICD-10-CM

## 2023-01-31 DIAGNOSIS — N183 Chronic kidney disease, stage 3 unspecified: Secondary | ICD-10-CM

## 2023-01-31 DIAGNOSIS — R899 Unspecified abnormal finding in specimens from other organs, systems and tissues: Secondary | ICD-10-CM

## 2023-01-31 DIAGNOSIS — E119 Type 2 diabetes mellitus without complications: Secondary | ICD-10-CM | POA: Diagnosis not present

## 2023-01-31 DIAGNOSIS — R799 Abnormal finding of blood chemistry, unspecified: Secondary | ICD-10-CM | POA: Insufficient documentation

## 2023-01-31 DIAGNOSIS — Z7982 Long term (current) use of aspirin: Secondary | ICD-10-CM | POA: Insufficient documentation

## 2023-01-31 DIAGNOSIS — Z79899 Other long term (current) drug therapy: Secondary | ICD-10-CM | POA: Insufficient documentation

## 2023-01-31 LAB — CBC
HCT: 35.2 % — ABNORMAL LOW (ref 36.0–46.0)
Hemoglobin: 11.5 g/dL — ABNORMAL LOW (ref 12.0–15.0)
MCH: 30.5 pg (ref 26.0–34.0)
MCHC: 32.7 g/dL (ref 30.0–36.0)
MCV: 93.4 fL (ref 80.0–100.0)
Platelets: 222 10*3/uL (ref 150–400)
RBC: 3.77 MIL/uL — ABNORMAL LOW (ref 3.87–5.11)
RDW: 13.4 % (ref 11.5–15.5)
WBC: 4.7 10*3/uL (ref 4.0–10.5)
nRBC: 0 % (ref 0.0–0.2)

## 2023-01-31 LAB — BASIC METABOLIC PANEL
Anion gap: 13 (ref 5–15)
BUN: 23 mg/dL (ref 6–23)
BUN: 21 mg/dL (ref 8–23)
CO2: 27 mEq/L (ref 19–32)
CO2: 23 mmol/L (ref 22–32)
Calcium: 10 mg/dL (ref 8.4–10.5)
Calcium: 9.8 mg/dL (ref 8.9–10.3)
Chloride: 104 mEq/L (ref 96–112)
Chloride: 103 mmol/L (ref 98–111)
Creatinine, Ser: 1.28 mg/dL — ABNORMAL HIGH (ref 0.40–1.20)
Creatinine, Ser: 1.32 mg/dL — ABNORMAL HIGH (ref 0.44–1.00)
GFR, Estimated: 42 mL/min — ABNORMAL LOW (ref >60)
GFR: 40.88 mL/min — ABNORMAL LOW (ref >60.00)
Glucose, Bld: 88 mg/dL (ref 70–99)
Glucose, Bld: 132 mg/dL — ABNORMAL HIGH (ref 70–99)
Potassium: 6.7 mEq/L (ref 3.5–5.1)
Potassium: 4.1 mmol/L (ref 3.5–5.1)
Sodium: 137 mEq/L (ref 135–145)
Sodium: 139 mmol/L (ref 135–145)

## 2023-01-31 LAB — HEMOGLOBIN A1C: Hgb A1c MFr Bld: 9.1 % — ABNORMAL HIGH (ref 4.6–6.5)

## 2023-01-31 LAB — TROPONIN I (HIGH SENSITIVITY)
Troponin I (High Sensitivity): 8 ng/L (ref <18)
Troponin I (High Sensitivity): 9 ng/L (ref <18)

## 2023-01-31 LAB — POCT GLYCOSYLATED HEMOGLOBIN (HGB A1C): Hemoglobin A1C: 8.5 % — AB (ref 4.0–5.6)

## 2023-01-31 MED ORDER — SODIUM POLYSTYRENE SULFONATE PO POWD: Freq: Once | ORAL | 0 refills | Status: AC

## 2023-01-31 NOTE — Telephone Encounter (Signed)
Pt contacted and advised of lab result and rx sent to pharmacy to be take right away. Pt advised to follow up with Dr Malen Gauze and hold off on Losartan. Pt verbalized understanding.

## 2023-01-31 NOTE — Patient Instructions (Signed)
Take Novolog 10 units at [facility] daily

## 2023-01-31 NOTE — ED Provider Triage Note (Signed)
Emergency Medicine Provider Triage Evaluation Note  Mariah Peterson , a 77 y.o. female  was evaluated in triage.  Pt complains of abnormal labs. Pt went to see her endocrinologist Dr. Lucianne Muss for a regular check up today.  Labs was obtained and she was notified that her K+ was 6.7 and to come to the ER.  She denies having any sxs.  No headache, lighthead, cp, sob, abd pain, weakness, heart palpitation.    Review of Systems  Positive: As above Negative: As above  Physical Exam  BP (!) 230/79 (BP Location: Right Arm)   Pulse 71   Temp 98.5 F (36.9 C)   Resp 18   SpO2 99%  Gen:   Awake, no distress   Resp:  Normal effort  MSK:   Moves extremities without difficulty  Other:    Medical Decision Making  Medically screening exam initiated at 8:51 PM.  Appropriate orders placed.  Emmarose N Herzberg was informed that the remainder of the evaluation will be completed by another provider, this initial triage assessment does not replace that evaluation, and the importance of remaining in the ED until their evaluation is complete.  BP 230/79 and K+ 6.7 without hemolysis.  Request for next available room.     Fayrene Helper, PA-C 01/31/23 2053

## 2023-01-31 NOTE — Telephone Encounter (Signed)
Call from lab to advise pt Potassium was 6.7

## 2023-01-31 NOTE — ED Triage Notes (Signed)
Pt states that she went to see Dr. Lucianne Muss today and had labs done today and K levels were high. 6.7 on lab work. Denies CP

## 2023-01-31 NOTE — Progress Notes (Signed)
Patient ID: Mariah Peterson, female   DOB: Jul 22, 1946, 77 y.o.   MRN: [id number]          Reason for Appointment: Follow-up    History of Present Illness:          Date of diagnosis of type 2 diabetes mellitus:?  1990       Background history:   She was started on insulin about 4 years ago and previously taking metformin and glipizide No detailed history is available and she does not think she has taken any other types of diabetes medications or injections  Recent history:    INSULIN regimen is: Lantus 12 units daily in am, Novolog 8 units after breakfast, 6 units at lunch and 6 at supper   Non-insulin hypoglycemic drugs the patient is taking are:   Ozempic 0.25 mg weekly  Her A1c is 8.5 and better  Last fructosamine 337  Interpretation of her freestyle [employer] for the last 2 weeks  Her freestyle libre data is again limited with only 35 % active CGM time Most of the data is available only for the overnight and morning times Overnight blood sugars are highly variable but mostly high but on at least a couple of occasions has been near normal 2 nights ago as low as 59, also overnight blood sugar data is not complete Postprandial readings are only available to be assessed after breakfast and these are variably high but generally increasing significantly to as much is 324 Average blood sugar after breakfast is 211 Overnight blood sugars are averaging between 140 and 180 Time in range 47% AVERAGE overall 183  Current management, blood sugar patterns and problems identified:    She has been able to continue Ozempic but is waiting for patient assistance for Jardiance She did not increase her breakfast doses to 11 units as directed and is still taking 8 units She thinks she is taking her mealtime insulin before eating Not clear why her overnight blood sugars are highly variable despite reporting taking consistent doses of Lantus Even with a blood sugar of 59 2 nights ago  she says she was not symptomatic She is about to get her Dexcom sensor to replace her Josephine Igo 2 Recently not doing much exercise Not clear why she has lost weight in the last 2 months   Breakfast usually 9 am, lunch 1 PM, supper 6 PM    Glucose sensor: Freestyle libre 2  Previous data:  CGM use % of time 40  2-week average/GV 172/35   Time in range     66%  % Time Above 180 18  % Time above 250 15  % Time Below 70 1    She was seen by diabetes educator in 9/19       Side effects from medications have been: None  Compliance with the medical regimen: Fair  Typical meal intake: Breakfast is oatmeal, otherwise eggs and toast.  Lunch may be rice and beans or a sandwich at 4 pm Fruit for snacks                Dietician visit, most recent: 09/2018 CDE consultation: 07/01/2018  Weight history:  Wt Readings from Last 3 Encounters:  01/31/23 106 lb (48.1 kg)  11/28/22 114 lb 3.2 oz (51.8 kg)  10/25/22 114 lb 8 oz (51.9 kg)    Glycemic control:   Lab Results  Component Value Date   HGBA1C 8.5 (A) 01/31/2023   HGBA1C 9.5 (H) 11/23/2022  HGBA1C 8.1 (A) 10/25/2022   Lab Results  Component Value Date   MICROALBUR 16.1 (H) 11/28/2021   LDLCALC 45 08/07/2022   CREATININE 1.43 (H) 08/07/2022   Lab Results  Component Value Date   MICRALBCREAT 51.5 (H) 11/28/2021    Lab Results  Component Value Date   FRUCTOSAMINE 346 (H) 05/04/2022   FRUCTOSAMINE 337 (H) 01/23/2022   FRUCTOSAMINE 435 (H) 09/25/2021    Office Visit on 01/31/2023  Component Date Value Ref Range Status   Hemoglobin A1C 01/31/2023 8.5 (A)  4.0 - 5.6 % Final    Allergies as of 01/31/2023   No Known Allergies      Medication List        Accurate as of January 31, 2023  9:27 AM. If you have any questions, ask your nurse or doctor.          amLODipine 10 MG tablet Commonly known as: NORVASC Take 1 tablet (10 mg total) by mouth daily.   aspirin EC 81 MG tablet Take 1 tablet (81 mg total) by  mouth daily. Swallow whole.   calcium carbonate 1500 (600 Ca) MG Tabs tablet Commonly known as: OSCAL Take 1 tablet by mouth 2 (two) times daily with a meal.   cloNIDine 0.1 MG tablet Commonly known as: CATAPRES Take 0.1 mg by mouth 2 (two) times daily.   empagliflozin 10 MG Tabs tablet Commonly known as: Jardiance Take 1 tablet (10 mg total) by mouth daily with breakfast.   ezetimibe 10 MG tablet Commonly known as: ZETIA Take 1 tablet by mouth once daily   FreeStyle Libre 2 Sensor Misc CHANGE EVERY 14 DAYS   furosemide 20 MG tablet Commonly known as: LASIX Take MWF   gabapentin 100 MG capsule Commonly known as: NEURONTIN Take 1 capsule (100 mg total) by mouth 3 (three) times daily.   glucose blood test strip Use as instructed to test blood sugar 3 times daily E11.65   IRON PO Take by mouth.   losartan 100 MG tablet Commonly known as: COZAAR Take 1 tablet by mouth once daily   metoprolol tartrate 25 MG tablet Commonly known as: LOPRESSOR Take 1 tablet by mouth twice daily   NovoLOG FlexPen 100 UNIT/ML FlexPen Generic drug: insulin aspart 6 UNITS BEFORE Breakfast on exercise days and if eating eggs 8 Units on other days LUNCH COVERAGE: NOVOLOG 8 UNITS if eating at home and 10 units for fast food Dinner 6 units Novolog before eating   Ozempic (0.25 or 0.5 MG/DOSE) 2 MG/1.5ML Sopn Generic drug: Semaglutide(0.25 or 0.5MG /DOS) Inject 0.5 mg into the skin once a week. 0.25mg  weekly for 4 weeks then 0.5mg  weekly   pioglitazone 15 MG tablet Commonly known as: ACTOS Take 1 tablet by mouth once daily   rosuvastatin 20 MG tablet Commonly known as: CRESTOR Take 1 tablet by mouth once daily   Semglee (yfgn) 100 UNIT/ML Pen Generic drug: insulin glargine-yfgn INJECT 16 UNITS SUBCUTANEOUSLY ONCE DAILY        Allergies: No Known Allergies  Past Medical History:  Diagnosis Date   Chest pain 11/30/2020   Coronary artery disease    Diabetes mellitus without  complication    Glaucoma    History of chicken pox    Hypertension     Past Surgical History:  Procedure Laterality Date   BREAST BIOPSY Right 2017   benign   CARDIAC CATHETERIZATION     CATARACT EXTRACTION, BILATERAL Bilateral    CESAREAN SECTION     COLONOSCOPY  1980's   CORONARY ARTERY BYPASS GRAFT N/A 12/05/2020   Procedure: CORONARY ARTERY BYPASS GRAFTING (CABG), ON PUMP, TIMES FOUR, USING LEFT INTERNAL MAMMARY ARTERY AND BILATERAL ENDOSCOPICALLY HARVESTED GREATER SAPHENOUS VEINS;  Surgeon: Corliss SkainsLightfoot, Harrell O, MD;  Location: [facility] OR;  Service: Open Heart Surgery;  Laterality: N/A;   LEFT HEART CATH AND CORONARY ANGIOGRAPHY N/A 11/30/2020   Procedure: LEFT HEART CATH AND CORONARY ANGIOGRAPHY;  Surgeon: Lyn RecordsSmith, Henry W, MD;  Location: [facility] INVASIVE CV LAB;  Service: Cardiovascular;  Laterality: N/A;   TEE WITHOUT CARDIOVERSION N/A 12/05/2020   Procedure: TRANSESOPHAGEAL ECHOCARDIOGRAM (TEE);  Surgeon: Corliss SkainsLightfoot, Harrell O, MD;  [facility];  Service: Open Heart Surgery;  Laterality: N/A;   TUBAL LIGATION      Family History  Problem Relation Age of Onset   Hypertension Mother    Diabetes Brother    CAD Brother    Hypertension Brother    Diabetes Brother    Hypertension Brother    Colon cancer Neg Hx    Esophageal cancer Neg Hx    Pancreatic cancer Neg Hx    Liver disease Neg Hx    Stomach cancer Neg Hx    Rectal cancer Neg Hx    Colon polyps Neg Hx     Social History:  reports that she has never smoked. She has never used smokeless tobacco. She reports that she does not drink alcohol and does not use drugs.   Review of Systems   Lipid history: Currently on Crestor 20 mg Previously had not been taking it regularly with LDL as high as 226 She is also on Zetia  LDL below 70  Lab Results  Component Value Date   CHOL 117 08/07/2022   CHOL 128 05/02/2021   CHOL 163 03/03/2021   Lab Results  Component Value Date   HDL 52.60 08/07/2022   HDL 58.60 05/02/2021    HDL 66.80 03/03/2021   Lab Results  Component Value Date   LDLCALC 45 08/07/2022   LDLCALC 56 05/02/2021   LDLCALC 77 03/03/2021   Lab Results  Component Value Date   TRIG 100.0 08/07/2022   TRIG 64.0 05/02/2021   TRIG 97.0 03/03/2021   Lab Results  Component Value Date   CHOLHDL 2 08/07/2022   CHOLHDL 2 05/02/2021   CHOLHDL 2 03/03/2021   Lab Results  Component Value Date   LDLDIRECT 33.0 01/23/2022            Hypertension: Has been present for several years, generally difficult to control  She is being prescribed amlodipine, clonidine and losartan 100 mg  She states she had nausea with the clonidine patch and is taking the tablets Taking low-dose metoprolol from PCP also On the last visit was reminded to fill her prescription for amlodipine   BP Readings from Last 3 Encounters:  01/31/23 (!) 142/80  11/28/22 (!) 226/84  10/25/22 (!) 183/84   CKD: She is followed by nephrologist, last visit 2/24  Etiology appears to be diabetes and hypertension  No history of microalbuminuria   Lab Results  Component Value Date   CREATININE 1.43 (H) 08/07/2022   CREATININE 1.68 (H) 05/04/2022   CREATININE 1.7 (A) 02/23/2022    Most recent eye exam was in 2/24  Most recent foot exam: 4/24   NEUROPATHY: She has been complaining of burning in her feet especially at night, better on gabapentin  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  LABS:  Office Visit on 01/31/2023  Component Date Value Ref  Range Status   Hemoglobin A1C 01/31/2023 8.5 (A)  4.0 - 5.6 % Final    Physical Examination:  BP (!) 142/80 (BP Location: Right Arm, Patient Position: Sitting, Cuff Size: Small)   Pulse 70   Ht 4\' 11"  (1.499 m)   Wt 106 lb (48.1 kg)   SpO2 99%   BMI 21.41 kg/m   Diabetic Foot Exam - Simple   Simple Foot Form Diabetic Foot exam was performed with the following findings: Yes   Visual Inspection No deformities, no ulcerations, no other skin  breakdown bilaterally: Yes Sensation Testing Intact to touch and monofilament testing bilaterally: Yes Pulse Check See comments: Yes Comments Absent left foot pulses     ASSESSMENT:  Diabetes type 2, insulin-dependent    A1c is 8.5, higher than usual  See history of present illness for discussion of current diabetes management, blood sugar patterns and problems identified  She is on basal bolus insulin, Actos 15 mg and low-dose Ozempic  Problems identified: Taking her morning NovoLog 1 hour after eating instead of before This is causing high readings after breakfast and tendency to low sugars midday with occasional readings in the 40s High variability in her blood sugars after breakfast even though she is eating similar meals every morning  Appears to be having high readings late in the evening after her evening snack  Again overnight blood sugars are somewhat inconsistent Unable to verify her blood sugar readings after lunch because of lack of monitoring Needs more diabetes education  HYPERTENSION: Blood pressure is recently better controlled On multiple medications as before  Renal dysfunction: Followed by nephrology, may benefit long-term from Jardiance which she is getting patient assistance application for  Absent left pedal pulses: Asymptomatic currently  PLAN:   Continue Ozempic 0.25 mg weekly She needs to take at 10 units of NovoLog at breakfast and advised her to follow the instructions on the visit summary She will let us know when she receives her Dexcom sensor Will train her to start using this In the meantime discussed that she needs to make sure she checks her blood sugar 4 times a day No change in Lantus but consider switching to [place] for more consistent blood sugars overnight, may be able to get this on patient assistance also   For hypertension she will continue the same treatment  Encouraged her to check her feet daily    Patient Instructions   Take Novolog 10 units at [facility] daily    Total visit time for evaluation and management of multiple problems and counseling = 40 minutes  Reather Littler 01/31/2023, 9:27 AM   Note: This office note was prepared with Dragon voice recognition system technology. Any transcriptional errors that result from this process are unintentional.

## 2023-02-01 DIAGNOSIS — R799 Abnormal finding of blood chemistry, unspecified: Secondary | ICD-10-CM | POA: Diagnosis not present

## 2023-02-01 MED ORDER — DIPHENHYDRAMINE HCL 25 MG PO CAPS
25.0000 mg | ORAL_CAPSULE | Freq: Once | ORAL | Status: AC
  Administered 2023-02-01: 25 mg via ORAL
  Filled 2023-02-01: qty 1

## 2023-02-01 MED ORDER — ACETAMINOPHEN 500 MG PO TABS
1000.0000 mg | ORAL_TABLET | ORAL | Status: AC
  Administered 2023-02-01: 1000 mg via ORAL
  Filled 2023-02-01: qty 2

## 2023-02-01 MED ORDER — METOPROLOL TARTRATE 25 MG PO TABS
50.0000 mg | ORAL_TABLET | ORAL | Status: AC
  Administered 2023-02-01: 50 mg via ORAL
  Filled 2023-02-01: qty 2

## 2023-02-01 NOTE — Discharge Instructions (Addendum)
Your abnormal serum potassium levels were erroneous when collected at your doctor's office. Your levels are normal here.  Please follow up with your doctor to recheck blood pressure.  You may always return to ER for any new or concerning symptoms.

## 2023-02-01 NOTE — ED Provider Notes (Signed)
[place] EMERGENCY DEPARTMENT AT [facility] Provider Note   CSN: [id number] Arrival date & time: 01/31/23  2029     History  Chief Complaint  Patient presents with   Abnormal Lab    Mariah Peterson is a 77 y.o. female.   Abnormal Lab Patient is a 77 year old female with a past medical history sniffing for HLD, DM2, HTN, glaucoma, CAD  She is present emergency room today with complaints of abnormal lab.  She states that she was seen by her primary care office today and had lab work done and was called later told that her potassium was 6.7  She states this visit to her PCP office was a follow-up visit for continued diabetes management.  She states she has no symptoms currently apart from mild headache which is nearly resolved.  She denies any chest pain or difficulty breathing no lightheadedness or dizziness.  She states she does feel somewhat anxious she was very worried about her abnormal labs.     Home Medications Prior to Admission medications   Medication Sig Start Date End Date Taking? Authorizing Provider  amLODipine (NORVASC) 10 MG tablet Take 1 tablet (10 mg total) by mouth daily. 11/28/22   Reather Littler, MD  aspirin EC 81 MG tablet Take 1 tablet (81 mg total) by mouth daily. Swallow whole. 03/22/21   Jake Bathe, MD  calcium carbonate (OSCAL) 1500 (600 Ca) MG TABS tablet Take 1 tablet by mouth 2 (two) times daily with a meal.    [provider]  cloNIDine (CATAPRES) 0.1 MG tablet Take 0.1 mg by mouth 2 (two) times daily. 12/24/22   [provider]  Continuous Blood Gluc Sensor (FREESTYLE LIBRE 2 SENSOR) MISC CHANGE EVERY 14 DAYS 04/20/22   Reather Littler, MD  empagliflozin (JARDIANCE) 10 MG TABS tablet Take 1 tablet (10 mg total) by mouth daily with breakfast. Patient not taking: Reported on 01/31/2023 08/10/22   Reather Littler, MD  ezetimibe (ZETIA) 10 MG tablet Take 1 tablet by mouth once daily 09/25/22   Reather Littler, MD  Ferrous Sulfate (IRON PO)  Take by mouth.    [provider]  furosemide (LASIX) 20 MG tablet Take MWF 06/13/22   Philip Aspen, Limmie Patricia, MD  gabapentin (NEURONTIN) 100 MG capsule Take 1 capsule (100 mg total) by mouth 3 (three) times daily. 05/08/22   Reather Littler, MD  glucose blood test strip Use as instructed to test blood sugar 3 times daily E11.65 11/24/18   Reather Littler, MD  insulin aspart (NOVOLOG FLEXPEN) 100 UNIT/ML FlexPen 6 UNITS BEFORE Breakfast on exercise days and if eating eggs 8 Units on other days LUNCH COVERAGE: NOVOLOG 8 UNITS if eating at home and 10 units for fast food Dinner 6 units Novolog before eating 05/08/22   Reather Littler, MD  insulin glargine-yfgn (SEMGLEE, YFGN,) 100 UNIT/ML Pen INJECT 16 UNITS SUBCUTANEOUSLY ONCE DAILY Patient not taking: Reported on 01/31/2023 10/08/22   Reather Littler, MD  losartan (COZAAR) 100 MG tablet Take 1 tablet by mouth once daily 10/04/22   Philip Aspen, Limmie Patricia, MD  metoprolol tartrate (LOPRESSOR) 25 MG tablet Take 1 tablet by mouth twice daily 01/23/23   Philip Aspen, Limmie Patricia, MD  pioglitazone (ACTOS) 15 MG tablet Take 1 tablet by mouth once daily 12/31/22   Reather Littler, MD  rosuvastatin (CRESTOR) 20 MG tablet Take 1 tablet by mouth once daily 10/30/22   Reather Littler, MD  Semaglutide,0.25 or 0.5MG /DOS, (OZEMPIC, 0.25 OR 0.5 MG/DOSE,) 2  MG/1.5ML SOPN Inject 0.5 mg into the skin once a week. 0.25mg  weekly for 4 weeks then 0.5mg  weekly 05/02/21   Reather Littler, MD      Allergies    Patient has no known allergies.    Review of Systems   Review of Systems  Physical Exam Updated Vital Signs BP 129/89   Pulse 68   Temp 98.2 F (36.8 C) (Oral)   Resp (!) 29   SpO2 100%  Physical Exam Vitals and nursing note reviewed.  Constitutional:      General: She is not in acute distress.    Comments: Pleasant well-appearing 77 year old.  In no acute distress.  Sitting comfortably in bed.  Able answer questions appropriately follow commands. No increased work of  breathing. Speaking in full sentences.   HENT:     Head: Normocephalic and atraumatic.     Nose: Nose normal.  Eyes:     General: No scleral icterus. Cardiovascular:     Rate and Rhythm: Normal rate and regular rhythm.     Pulses: Normal pulses.     Heart sounds: Normal heart sounds.  Pulmonary:     Effort: Pulmonary effort is normal. No respiratory distress.     Breath sounds: No wheezing.  Abdominal:     Palpations: Abdomen is soft.     Tenderness: There is no abdominal tenderness.  Musculoskeletal:     Cervical back: Normal range of motion.     Right lower leg: No edema.     Left lower leg: No edema.  Skin:    General: Skin is warm and dry.     Capillary Refill: Capillary refill takes less than 2 seconds.  Neurological:     Mental Status: She is alert. Mental status is at baseline.  Psychiatric:        Mood and Affect: Mood normal.        Behavior: Behavior normal.     ED Results / Procedures / Treatments   Labs (all labs ordered are listed, but only abnormal results are displayed) Labs Reviewed  BASIC METABOLIC PANEL - Abnormal; Notable for the following components:      Result Value   Glucose, Bld 132 (*)    Creatinine, Ser 1.32 (*)    GFR, Estimated 42 (*)    All other components within normal limits  CBC - Abnormal; Notable for the following components:   RBC 3.77 (*)    Hemoglobin 11.5 (*)    HCT 35.2 (*)    All other components within normal limits  TROPONIN I (HIGH SENSITIVITY)  TROPONIN I (HIGH SENSITIVITY)    EKG EKG Interpretation  Date/Time:  Thursday January 31 2023 20:48:36 EDT Ventricular Rate:  72 PR Interval:  136 QRS Duration: 114 QT Interval:  446 QTC Calculation: 488 R Axis:   34 Text Interpretation: Normal sinus rhythm Right bundle branch block Abnormal ECG When compared with ECG of 06-Dec-2020 06:59, Right bundle branch block is now present Confirmed by Dione Booze ([id number]) on 02/01/2023 12:23:18 AM  Radiology DG Chest 1  View  Result Date: 01/31/2023 CLINICAL DATA:  Chest pain. EXAM: CHEST  1 VIEW COMPARISON:  January 13, 2021 FINDINGS: Multiple sternal wires and vascular clips are noted. The heart size and mediastinal contours are within normal limits. There is no evidence of an acute infiltrate, pleural effusion or pneumothorax. The visualized skeletal structures are unremarkable. IMPRESSION: 1. Evidence of prior median sternotomy/CABG. 2. No acute cardiopulmonary disease. Electronically Signed   By: Waylan Rocher  [place] M.D.   On: 01/31/2023 21:29    Procedures Procedures    Medications Ordered in ED Medications  metoprolol tartrate (LOPRESSOR) tablet 50 mg (50 mg Oral Given 02/01/23 0115)  diphenhydrAMINE (BENADRYL) capsule 25 mg (25 mg Oral Given 02/01/23 0115)  acetaminophen (TYLENOL) tablet 1,000 mg (1,000 mg Oral Given 02/01/23 0114)    ED Course/ Medical Decision Making/ A&P                             Medical Decision Making Risk OTC drugs. Prescription drug management.   This patient presents to the ED for concern of abn lab, this involves a number of treatment options, and is a complaint that carries with it a high risk of complications and morbidity. A differential diagnosis was considered for the patient's symptoms which is discussed below:   In this particular case the patient's elevated potassium was almost certainly factitious.  She has normal kidney function and her repeat potassium here is 4.1.  This is normal and I suspect that she has some hemolysis with her outpatient lab draw as she did have normal kidney function then as well. She has no symptoms.  Co morbidities: Discussed in HPI   Brief History:  Patient is a 77 year old female with a past medical history sniffing for HLD, DM2, HTN, glaucoma, CAD  She is present emergency room today with complaints of abnormal lab.  She states that she was seen by her primary care office today and had lab work done and was called later told  that her potassium was 6.7  She states this visit to her PCP office was a follow-up visit for continued diabetes management.  She states she has no symptoms currently apart from mild headache which is nearly resolved.  She denies any chest pain or difficulty breathing no lightheadedness or dizziness.  She states she does feel somewhat anxious she was very worried about her abnormal labs.    EMR reviewed including pt PMHx, past surgical history and past visits to ER.   See HPI for more details   Lab Tests:   I ordered and independently interpreted labs. Labs notable for normal kidney function, CBC with hemoglobin actually somewhat improved from baseline, troponin x 2 within normal limits I do not know for sure why the troponins were ordered as she has not had any complaints of chest pain shortness of breath nausea or any anginal equivalent.   Imaging Studies:  Chest x-ray without infiltrate    Cardiac Monitoring:  The patient was maintained on a cardiac monitor.  I personally viewed and interpreted the cardiac monitored which showed an underlying rhythm of: NSR EKG non-ischemic NSR right bundle branch block similar to May 17, 2022 EKG   Medicines ordered:  I ordered medication including Benadryl, metoprolol, Tylenol for headache hypertension and some anxiety Reevaluation of the patient after these medicines showed that the patient improved I have reviewed the patients home medicines and have made adjustments as needed   Critical Interventions:     Consults/Attending Physician      Reevaluation:  After the interventions noted above I re-evaluated patient and found that they have :stayed the same asymptomatic   Social Determinants of Health:      Problem List / ED Course:  Patient with factitious elevated potassium.  She has no symptoms.  She is well-appearing on exam has EKG without any evidence of ischemia normal troponins, normal labs here.  Will  discharge home with recommendations to follow-up outpatient for continued monitoring of her blood sugar.   Dispostion:  After consideration of the diagnostic results and the patients response to treatment, I feel that the patent would benefit from discharge with outpatient follow-up.   Final Clinical Impression(s) / ED Diagnoses Final diagnoses:  Abnormal laboratory test    Rx / DC Orders ED Discharge Orders     None         Gailen Shelter, [place] 02/01/23 0427    Dione Booze, MD 02/01/23 1610    Dione Booze, MD 02/01/23 [phone]

## 2023-02-04 ENCOUNTER — Encounter: Payer: Self-pay | Admitting: Endocrinology

## 2023-02-04 ENCOUNTER — Telehealth: Payer: Self-pay

## 2023-02-04 NOTE — Progress Notes (Signed)
   02/04/2023  Patient ID: Mariah Peterson, female   DOB: Feb 27, 1946, 77 y.o.   MRN: [id number]  Contact made to explain information lacking on initial PAP application for Jardiance.  Stated the medication assistance team was re-starting the application process, and she would be received the patient portion to complete.  Educated that she would need to provide total number of household members and income information in addition to signing and dating the document.  Provided my direct number in case there are any questions.  Patient has scheduled appointment for 5/7 with [facility], so I will follow note as FYI to them.  Lenna Gilford, PharmD, DPLA

## 2023-02-05 ENCOUNTER — Telehealth: Payer: Self-pay | Admitting: Internal Medicine

## 2023-02-05 ENCOUNTER — Telehealth: Payer: Self-pay

## 2023-02-05 NOTE — Telephone Encounter (Signed)
     Patient  visit on 02/01/2023  at [facility] was for Abnormal Lab.  Have you been able to follow up with your primary care physician? Yes  The patient was or was not able to obtain any needed medicine or equipment. No medication prescribed.  Are there diet recommendations that you are having difficulty following? No  Patient expresses understanding of discharge instructions and education provided has no other needs at this time. Yes   Jonn Chaikin [facility]  [facility] Population Health Community Resource Care Guide   [email or web address]  ?? [phone]   Website: [email or web address]  [email or web address]

## 2023-02-05 NOTE — Telephone Encounter (Signed)
[employer] - CB:  [phone]  Calling to FU on request for MD to sign form regarding Genetic Testing, that was faxed almost 2 weeks ago On behalf of [facility]  Please send signed form to: Fax: [phone]

## 2023-02-05 NOTE — Telephone Encounter (Signed)
Placed on Dr Hernandez's desk 

## 2023-02-07 NOTE — Telephone Encounter (Signed)
Form faxed and confirmed

## 2023-02-14 ENCOUNTER — Telehealth: Payer: Self-pay

## 2023-02-14 NOTE — Telephone Encounter (Signed)
Pt portion mailed out 02/04/2023

## 2023-02-21 ENCOUNTER — Telehealth: Payer: Self-pay

## 2023-02-21 NOTE — Progress Notes (Signed)
Care Management & Coordination Services Pharmacy Team  Reason for Encounter: Appointment Reminder  Contacted patient to confirm in office appointment with Delano Metz, PharmD on 02/26/2023 at 10:00. Spoke with patient on 02/21/2023   Have you seen any other providers since your last visit? **Patient denies   Any changes in your medications or health? Patient denies  Any side effects from any medications? Patient denies  Do you have an symptoms or problems not managed by your medications? Patient denies  Any concerns about your health right now? Patient denies  Has your provider asked that you check blood pressure, blood sugar, or follow special diet at home? Yes, patient does check her blood sugars 2-3 times per day. Patient does not follow any type of diet.   Do you get any type of exercise on a regular basis? Patient walks every other day  Can you think of a goal you would like to reach for your health? Patient would like to get her diabetes under control  Do you have any problems getting your medications? Patient does have issues getting Jardiance due to cost  Is there anything that you would like to discuss during the appointment? Patient denies  Patient notified to bring medications, supplements and blood sugar logs to appointment   Chart review:  Recent office visits:  10/25/2022 Philip Aspen, Limmie Patricia, MD - Patient was seen for Type 2 diabetes mellitus with stage 3b chronic kidney disease, with long-term current use of insulin and additional concerns. No medication changes.    06/27/2022 Theresa Mulligan LPN - Encounter for Medicare annual wellness exam.  Recent consult visits:  01/31/2023 Reather Littler MD (endocrinology) - Patient was seen for Type 2 diabetes mellitus with diabetic neuropathy, with long-term current use of insulin and additional concerns.No medication changes.  11/28/2022 Reather Littler MD (endocrinology) - Patient was seen for Uncontrolled type 2  diabetes mellitus with hyperglycemia, with long-term current use of insulin and additional concerns. Increased Amlodipine to 10 mg. Discontinued clonidine.   Hospital visits:  Patient was seen at [facility] ED on 01/31/2023 (6 hours) due to abnormal lab test. Discharge date was 02/01/2023.    New?Medications Started at [facility] Discharge:?? None Medication Changes at Hospital Discharge: None Medications Discontinued at Hospital Discharge: None Medications that remain the same after Hospital Discharge:??  -All other medications will remain the same.    Care Gaps: AWV - completed 06/27/2022 Last eye exam - 12/12/2022 Last foot exam - 01/31/2023 Last BP - 142/80 on 01/31/2023 Last A1C - 8.5 on 01/31/2023 Covid - overdue  Star Rating Drugs:  Jardiance 10 mg - last filled 12/07/2022 14 DS at [facility] verified with patient Losartan 100 mg - last filled 12/21/2022 90 DS at [facility] Rosuvastatin 20 mg - last filled 10/30/2022 30 DS at [facility] verified with patient (she will miss doses) Ozempic - 0.5 mg - filled through PAP program with Dr. Remus Blake office   Inetta Fermo [facility]  Clinical Pharmacist Assistant [phone]

## 2023-02-25 ENCOUNTER — Telehealth: Payer: Self-pay

## 2023-02-25 NOTE — Progress Notes (Signed)
Patient ID: Mariah Peterson, female   DOB: 06-13-1946, 77 y.o.   MRN: [id number]  Care Management & Coordination Services Pharmacy Team  Reason for Encounter: Appointment Reminder  Contacted patient to confirm in office appointment with Milas Kocher, PharmD on 02/26/23 at 10. Unsuccessful outreach. Left voicemail for patient to return call.    Care Gaps: AWV - completed 06/27/2022 Last eye exam - 12/12/2022 Last foot exam - 01/31/2023 Last BP - 142/80 on 01/31/2023 Last A1C - 8.5 on 01/31/2023 Covid - overdue   Star Rating Drugs:  Jardiance 10 mg - last filled 12/07/2022 14 DS at [facility] verified with patient Losartan 100 mg - last filled 12/21/2022 90 DS at [facility] Rosuvastatin 20 mg - last filled 10/30/2022 30 DS at [facility] verified with patient (she will miss doses) Ozempic - 0.5 mg - filled through PAP program with Dr. Remus Blake office   Pamala Duffel CMA Clinical Pharmacist Assistant [phone]

## 2023-02-25 NOTE — Progress Notes (Unsigned)
Care Management & Coordination Services Pharmacy Note  02/25/2023 Name:  Mariah Peterson MRN:  [id number] DOB:  [birth year]  Summary: BP not at goal <140/90, pt brings in reading from today for 130/85 from home A1C not at goal <7 LDL at goal <70 but has not taking crestor since Feb (ran out and did not notify)  Recommendations/Changes made from today's visit: -Continue to check BP at home 2-3x/week and keep a log -Counseled on importance of med adherence and taking medications around the same time each day -Counseled on limiting sodium intake -Counseled on importance of regular glucose monitoring with CGM device -PAP for Jardiance pending from Dr. Remus Blake office, 28 DS of samples provided in office while application pends -Refill requested from Dr. Lucianne Muss on crestor, counseled on importance of adherence to statin  Follow up plan: DM call in June and August Pharmacist visit in October   Subjective: Mariah Peterson is a 77 y.o. year old female who is a primary patient of Philip Aspen, Limmie Patricia, MD.  The care coordination team was consulted for assistance with disease management and care coordination needs.    Engaged with patient face to face for initial visit. Presents with her spouse. 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  Recent office visits: 10/25/2022 Philip Aspen, Limmie Patricia, MD - Patient was seen for Type 2 diabetes mellitus with stage 3b chronic kidney disease, with long-term current use of insulin and additional concerns. No medication changes.     06/27/2022 Theresa Mulligan LPN - Encounter for Medicare annual wellness exam.  Recent consult visits: 01/31/2023 Reather Littler MD (endocrinology) - Patient was seen for Type 2 diabetes mellitus with diabetic neuropathy, with long-term current use of insulin and additional concerns.No medication changes.   11/28/2022 Reather Littler MD (endocrinology) - Patient was seen for Uncontrolled type 2 diabetes mellitus with  hyperglycemia, with long-term current use of insulin and additional concerns. Increased Amlodipine to 10 mg. Discontinued clonidine.  Hospital visits: 01/31/23 [facility] ED - For abnormal lab test, LOS 6 hours, no medication changes   Objective:  Lab Results  Component Value Date   CREATININE 1.32 (H) 01/31/2023   BUN 21 01/31/2023   GFR 40.88 (L) 01/30/2023   EGFR 31 02/23/2022   GFRNONAA 42 (L) 01/31/2023   GFRAA 39 (L) 06/26/2018   NA 139 01/31/2023   K 4.1 01/31/2023   CALCIUM 9.8 01/31/2023   CO2 23 01/31/2023   GLUCOSE 132 (H) 01/31/2023    Lab Results  Component Value Date/Time   HGBA1C 8.5 (A) 01/31/2023 09:00 AM   HGBA1C 9.1 (H) 01/30/2023 02:48 PM   HGBA1C 9.5 (H) 11/23/2022 10:25 AM   FRUCTOSAMINE 346 (H) 05/04/2022 09:50 AM   FRUCTOSAMINE 337 (H) 01/23/2022 10:26 AM   GFR 40.88 (L) 01/30/2023 02:48 PM   GFR 35.91 (L) 08/07/2022 09:57 AM   MICROALBUR 16.1 (H) 11/28/2021 10:27 AM   MICROALBUR 51.3 (H) 08/09/2021 09:54 AM    Last diabetic Eye exam:  Lab Results  Component Value Date/Time   HMDIABEYEEXA  12/12/2022 12:48 PM     Comment:     Unknown Result    Last diabetic Foot exam: No results found for: "HMDIABFOOTEX"   Lab Results  Component Value Date   CHOL 117 08/07/2022   HDL 52.60 08/07/2022   LDLCALC 45 08/07/2022   LDLDIRECT 33.0 01/23/2022   TRIG 100.0 08/07/2022   CHOLHDL 2 08/07/2022       Latest Ref Rng & Units 08/07/2022  9:57 AM 08/09/2021    9:54 AM 03/03/2021   10:17 AM  Hepatic Function  Total Protein 6.0 - 8.3 g/dL 7.2  7.3  6.8   Albumin 3.5 - 5.2 g/dL 4.1  4.2  3.9   AST 0 - 37 U/L 22  23  20    ALT 0 - 35 U/L 15  16  15    Alk Phosphatase 39 - 117 U/L 46  46  51   Total Bilirubin 0.2 - 1.2 mg/dL 0.6  0.6  0.4     Lab Results  Component Value Date/Time   TSH 2.08 10/10/2020 11:44 AM       Latest Ref Rng & Units 01/31/2023    8:58 PM 02/23/2022   12:00 AM 12/08/2020    1:01 AM  CBC  WBC 4.0 - 10.5 K/uL 4.7   9.0    Hemoglobin 12.0 - 15.0 g/dL 86.5  78.4     69.6   Hematocrit 36.0 - 46.0 % 35.2   32.2   Platelets 150 - 400 K/uL 222   170      This result is from an external source.    Lab Results  Component Value Date/Time   VD25OH 14.3 02/20/2022 12:00 AM   VITAMINB12 343 11/16/2019 04:03 PM   VITAMINB12 311 12/25/2018 09:44 AM   Clinical ASCVD: Yes  The ASCVD Risk score (Arnett DK, et al., 2019) failed to calculate for the following reasons:   The patient has a prior MI or stroke diagnosis       10/25/2022   10:26 AM 06/27/2022   10:40 AM 06/13/2022   10:03 AM  Depression screen PHQ 2/9  Decreased Interest 0 0 0  Down, Depressed, Hopeless 0 0 0  PHQ - 2 Score 0 0 0  Altered sleeping 0 0 2  Tired, decreased energy 0 0 0  Change in appetite 0 0 0  Feeling bad or failure about yourself  0 0 0  Trouble concentrating 0 0 0  Moving slowly or fidgety/restless 0 0 0  Suicidal thoughts 0 0 0  PHQ-9 Score 0 0 2  Difficult doing work/chores Not difficult at all Not difficult at all Not difficult at all     Social History   Tobacco Use  Smoking Status Never  Smokeless Tobacco Never   BP Readings from Last 3 Encounters:  02/01/23 129/89  01/31/23 (!) 142/80  11/28/22 (!) 226/84   Pulse Readings from Last 3 Encounters:  02/01/23 68  01/31/23 70  11/28/22 81   Wt Readings from Last 3 Encounters:  01/31/23 106 lb (48.1 kg)  11/28/22 114 lb 3.2 oz (51.8 kg)  10/25/22 114 lb 8 oz (51.9 kg)   BMI Readings from Last 3 Encounters:  01/31/23 21.41 kg/m  11/28/22 23.07 kg/m  10/25/22 23.13 kg/m    No Known Allergies  Medications Reviewed Today     Reviewed by Lisabeth Pick, CMA (Certified Medical Assistant) on 01/31/23 at 0846  Med List Status: <None>   Medication Order Taking? Sig Documenting Provider Last Dose Status Informant  amLODipine (NORVASC) 10 MG tablet [id number] Yes Take 1 tablet (10 mg total) by mouth daily. Reather Littler, MD Taking Active   aspirin EC 81 MG  tablet [id number] Yes Take 1 tablet (81 mg total) by mouth daily. Swallow whole. Jake Bathe, MD Taking Active   calcium carbonate (OSCAL) 1500 (600 Ca) MG TABS tablet [id number] Yes Take 1 tablet by mouth 2 (two) times daily with  a meal. [provider] Taking Active Self  cloNIDine (CATAPRES) 0.1 MG tablet [id number] Yes Take 0.1 mg by mouth 2 (two) times daily. [provider] Taking Active   Continuous Blood Gluc Sensor (FREESTYLE LIBRE 2 SENSOR) [place] [id number] Yes CHANGE EVERY 14 DAYS Reather Littler, MD Taking Active            Med Note Littie Deeds, CHERYL A   Tue Jan 29, 2023  3:37 PM) Mailed to her at no charge per patient  diclofenac Sodium (VOLTAREN) 1 % topical gel 2 g [id number]   Doree Fudge M, PA-C  Active   empagliflozin (JARDIANCE) 10 MG TABS tablet [id number] No Take 1 tablet (10 mg total) by mouth daily with breakfast.  Patient not taking: Reported on 01/31/2023   Reather Littler, MD Not Taking Active            Med Note Littie Deeds, CHERYL A   Tue Jan 29, 2023  3:37 PM) Could not afford  ezetimibe (ZETIA) 10 MG tablet [id number] Yes Take 1 tablet by mouth once daily Reather Littler, MD Taking Active   Ferrous Sulfate (IRON PO) [id number] Yes Take by mouth. [provider] Taking Active   furosemide (LASIX) 20 MG tablet [id number] Yes Take MWF Philip Aspen, Limmie Patricia, MD Taking Active   gabapentin (NEURONTIN) 100 MG capsule [id number] Yes Take 1 capsule (100 mg total) by mouth 3 (three) times daily. Reather Littler, MD Taking Active            Med Note Littie Deeds, [facility] A   Tue Jan 29, 2023  3:25 PM) Taking BID  glucose blood test strip [id number] Yes Use as instructed to test blood sugar 3 times daily E11.65 Reather Littler, MD Taking Active Self           Med Note Littie Deeds, CHERYL A   Tue Jan 29, 2023  3:28 PM) Using freestyle  insulin aspart (NOVOLOG FLEXPEN) 100 UNIT/ML FlexPen [id number] Yes 6 UNITS BEFORE Breakfast on exercise days and if eating eggs 8 Units on other days LUNCH  COVERAGE: NOVOLOG 8 UNITS if eating at home and 10 units for fast food Dinner 6 units Novolog before eating Reather Littler, MD Taking Active   insulin glargine-yfgn (SEMGLEE, YFGN,) 100 UNIT/ML Pen [id number] No INJECT 16 UNITS SUBCUTANEOUSLY ONCE DAILY  Patient not taking: Reported on 01/31/2023   Reather Littler, MD Not Taking Active   losartan (COZAAR) 100 MG tablet [id number] Yes Take 1 tablet by mouth once daily Philip Aspen, Limmie Patricia, MD Taking Active   metoprolol tartrate (LOPRESSOR) 25 MG tablet [id number] Yes Take 1 tablet by mouth twice daily Philip Aspen, Limmie Patricia, MD Taking Active            Med Note Littie Deeds, CHERYL A   Tue Jan 29, 2023  3:25 PM) Taking once daily  pioglitazone (ACTOS) 15 MG tablet [id number] Yes Take 1 tablet by mouth once daily Reather Littler, MD Taking Active   rosuvastatin (CRESTOR) 20 MG tablet [id number] Yes Take 1 tablet by mouth once daily Reather Littler, MD Taking Active            Med Note Littie Deeds, CHERYL A   Tue Jan 29, 2023  3:38 PM) Needs refill  Semaglutide,0.25 or 0.5MG /DOS, (OZEMPIC, 0.25 OR 0.5 MG/DOSE,) 2 MG/1.5ML SOPN [id number] Yes Inject 0.5 mg into the skin once a week. 0.25mg  weekly for 4 weeks then 0.5mg  weekly Reather Littler, MD Taking Active  Med Note Littie Deeds, CHERYL A   Tue Jan 29, 2023  3:38 PM) Getting through PAP            SDOH:  (Social Determinants of Health) assessments and interventions performed: Yes SDOH Interventions    Flowsheet Row Office Visit from 06/27/2022 in [facility] [place] Office Visit from 06/13/2022 in [facility] [place] Patient Outreach Telephone from 06/12/2022 in [facility] Care Coordination Office Visit from 02/08/2022 in [facility] [place] Clinical Support from 10/19/2020 in [facility] [place]  SDOH Interventions       Food Insecurity Interventions Intervention Not Indicated -- Intervention  Not Indicated -- --  Housing Interventions Intervention Not Indicated -- Intervention Not Indicated -- Intervention Not Indicated  Transportation Interventions Intervention Not Indicated -- Intervention Not Indicated -- Intervention Not Indicated  Utilities Interventions Intervention Not Indicated -- -- -- --  Depression Interventions/Treatment  -- PHQ2-9 Score <4 Follow-up Not Indicated -- PHQ2-9 Score <4 Follow-up Not Indicated --  Financial Strain Interventions Intervention Not Indicated -- -- -- Intervention Not Indicated  Physical Activity Interventions Intervention Not Indicated -- -- -- Intervention Not Indicated  Stress Interventions Intervention Not Indicated -- -- -- Intervention Not Indicated  Social Connections Interventions Intervention Not Indicated -- -- -- Intervention Not Indicated       Medication Assistance: PAP for Jardiance pending  Medication Access: Within the past 30 days, how often has patient missed a dose of medication? Has been out of rosuvastatin since Feb, has not been on jardiance due to cost Is a pillbox or other method used to improve adherence? Yes  Factors that may affect medication adherence? financial need Are meds synced by current pharmacy? No  Are meds delivered by current pharmacy? Yes  Does patient experience delays in picking up medications due to transportation concerns? No   [facility] Services Reviewed: Is patient disadvantaged to use [facility] Pharmacy?: Yes  Current Rx insurance plan: [employer] Name and location of Current pharmacy:  [facility] - [place], [place] - [address] [place] [address] Phone: [phone] Fax: [phone]  [facility] - [place], [place] - [address] [place] [address] Phone: [phone] Fax: [phone]  [facility] [place], [place] - [address] [address], [place] [address] [address] [place] [address] [place] [address] Phone: [phone] Fax: [phone]  [facility] Pharm[facility] services reviewed with patient today?: No  Patient requests to transfer care to [facility]?: No  Reason patient declined to change pharmacies: Disadvantaged due to insurance/mail order  Compliance/Adherence/Medication fill history: Care Gaps: AWV - completed 06/27/2022 Last eye exam - 12/12/2022 Last foot exam - 01/31/2023 Last A1C - 8.5 on 01/31/2023 Covid - overdue  Star-Rating Drugs: Jardiance 10 mg - last filled 12/07/2022 14 DS at [facility] verified with patient Losartan 100 mg - last filled 12/21/2022 90 DS at [facility] Rosuvastatin 20 mg - last filled 10/30/2022 30 DS at [facility] verified with patient (she will miss doses) Ozempic - 0.5 mg - filled through PAP program with Dr. Remus Blake office   Assessment/Plan Hypertension (BP goal <140/90) -Uncontrolled -Current treatment: Amlodipine 10mg  1 qd Appropriate, Effective, Safe, Accessible Clonidine 0.1mg  1 BID Appropriate, Effective, Safe, Accessible Lasix 20mg  1 tab MWF - not taking -  has been off of per pt report Losartan 100mg  1 qd Appropriate, Effective, Safe, Accessible Metoprolol tartrate 25mg  1  BID Appropriate, Effective, Safe, Accessible -Medications previously tried: Chlorthalidone, Lisinopril  -Current home readings: 130/85 this morning, no other log to report, elevated in office today but states she hadn't taken her meds yet -Current dietary habits: states she does add salt to food as she cooks but tries to limit -Current exercise habits: not discussed -Denies hypotensive/hypertensive symptoms -Educated on BP goals and benefits of medications for prevention of heart attack, stroke and kidney damage; Daily salt intake goal < 2300 mg; Exercise goal of 150 minutes per week; Importance of home blood pressure monitoring; Proper BP monitoring technique; -Counseled to monitor BP at home 2-3x/week, document, and provide log at  future appointments -Recommended to continue current medication  Hyperlipidemia: (LDL goal < 70) -Controlled -Current treatment: Crestor 20mg  1 qd Appropriate, Effective, Safe, Accessible - has been out of since Feb Zetia 10mg  1 qd Appropriate, Effective, Safe, Accessible -Medications previously tried: Simvastatin  -Current dietary patterns: see DM section -Educated on Cholesterol goals;  Benefits of statin for ASCVD risk reduction; Importance of limiting foods high in cholesterol; -Recommended to continue current medication, refill requested on statin from Dr. Lucianne Muss who prescribes  Diabetes (A1c goal <8%) -Uncontrolled -Current medications: Ozempic 0.25mg  once weekly Appropriate, Query Effective Actos 15mg  1qd Appropriate, Query Effective Lantus 16 units once daily  Appropriate, Effective, Safe, Accessible Novolog 6-8 units per instructions with meals Appropriate, Effective, Safe, Accessible Jardiance 10mg  1 qd Appropriate, Effective, Safe, Query Accessible -Medications previously tried: Lantus, Levemir, Metformin  -Current home glucose readings Did not bring log with appt and did not have her freestyle device with her Unable to recall BG numbers so impossible to assess current level of glucose control at home -Denies hypoglycemic/hyperglycemic symptoms -Current meal patterns:  breakfast: low sugar oatmeal, grits, eggs, bacon  lunch: something like a burger or sandwich  dinner: see above snacks: pb crackers drinks: water, diet soda -Current exercise: not discussed -Educated on A1c and blood sugar goals; Complications of diabetes including kidney damage, retinal damage, and cardiovascular disease; Exercise goal of 150 minutes per week; Proper insulin injection technique; Prevention and management of hypoglycemic episodes; Continuous glucose monitoring; -Counseled to check feet daily and get yearly eye exams -Counseled on diet and exercise extensively Recommended to continue  current medication -Pursuing Jardiance 10mg  PAP via Dr. Remus Blake office, samples of 28 tabs were provided in office while the application is pending  CAD (Goal: Slow progression of atherosclerosis (plaques / blockages) throughout your body to reduce risk of heart attack and strokes) -Not assessed today Current Medication Therapy: Aspirin 81mg  EC 1 tab qd Appropriate, Effective, Safe, Accessible  Sherrill Raring Clinical Pharmacist [phone]

## 2023-02-26 ENCOUNTER — Other Ambulatory Visit: Payer: Self-pay

## 2023-02-26 ENCOUNTER — Ambulatory Visit: Payer: Medicare Other

## 2023-02-26 MED ORDER — ROSUVASTATIN CALCIUM 20 MG PO TABS: 20.0000 mg | ORAL_TABLET | Freq: Every day | ORAL | 1 refills | Status: DC

## 2023-03-13 ENCOUNTER — Telehealth: Payer: Self-pay | Admitting: Endocrinology

## 2023-03-13 NOTE — Telephone Encounter (Signed)
Patient brought in a Patient Assistance Application with financial information.  Application is in Dr. Remus Blake folder in the front office.

## 2023-03-15 NOTE — Telephone Encounter (Signed)
PAP Application has been faxed.

## 2023-03-19 NOTE — Telephone Encounter (Signed)
Received fax.

## 2023-03-19 NOTE — Telephone Encounter (Signed)
Faxed provider portion to Dr. Remus Blake office

## 2023-03-27 ENCOUNTER — Telehealth: Payer: Self-pay

## 2023-03-27 NOTE — Telephone Encounter (Signed)
Attempted to contact pt at home number and could not with excessive static on the line. Lvm for alternative contact Jamie to have pt call back. Pt has delivery of Ozempic that was delivered and is ready for pick up. 5 boxes.

## 2023-04-01 ENCOUNTER — Telehealth: Payer: Self-pay

## 2023-04-01 NOTE — Telephone Encounter (Signed)
Pt has been denied for pt assistance through [employer] for Jardiance due to:

## 2023-04-01 NOTE — Progress Notes (Signed)
Care Management & Coordination Services Pharmacy Team  Patient denied for PAP for Jardiance due to qualifing for Low Income Subsidy.  Patient was notified and given their phone no. [phone] to call and apply.   Inetta Fermo [facility]  Clinical Pharmacist Assistant [phone]

## 2023-04-01 NOTE — Telephone Encounter (Signed)
Will reach out to patient to assist further, thank you! Sherrill Raring Clinical Pharmacist [phone]

## 2023-04-24 ENCOUNTER — Other Ambulatory Visit: Payer: Self-pay | Admitting: Endocrinology

## 2023-04-29 ENCOUNTER — Other Ambulatory Visit (INDEPENDENT_AMBULATORY_CARE_PROVIDER_SITE_OTHER): Payer: Medicare Other

## 2023-04-29 DIAGNOSIS — E1140 Type 2 diabetes mellitus with diabetic neuropathy, unspecified: Secondary | ICD-10-CM | POA: Diagnosis not present

## 2023-04-29 DIAGNOSIS — E7800 Pure hypercholesterolemia, unspecified: Secondary | ICD-10-CM | POA: Diagnosis not present

## 2023-04-29 DIAGNOSIS — Z794 Long term (current) use of insulin: Secondary | ICD-10-CM

## 2023-04-29 DIAGNOSIS — N183 Chronic kidney disease, stage 3 unspecified: Secondary | ICD-10-CM | POA: Diagnosis not present

## 2023-04-29 LAB — COMPREHENSIVE METABOLIC PANEL
ALT: 19 U/L (ref 0–35)
AST: 28 U/L (ref 0–37)
Albumin: 3.8 g/dL (ref 3.5–5.2)
Alkaline Phosphatase: 36 U/L — ABNORMAL LOW (ref 39–117)
BUN: 37 mg/dL — ABNORMAL HIGH (ref 6–23)
CO2: 23 mEq/L (ref 19–32)
Calcium: 9.8 mg/dL (ref 8.4–10.5)
Chloride: 108 mEq/L (ref 96–112)
Creatinine, Ser: 1.88 mg/dL — ABNORMAL HIGH (ref 0.40–1.20)
GFR: 25.73 mL/min — ABNORMAL LOW (ref >60.00)
Glucose, Bld: 129 mg/dL — ABNORMAL HIGH (ref 70–99)
Potassium: 4.4 mEq/L (ref 3.5–5.1)
Sodium: 139 mEq/L (ref 135–145)
Total Bilirubin: 0.4 mg/dL (ref 0.2–1.2)
Total Protein: 6.9 g/dL (ref 6.0–8.3)

## 2023-04-29 LAB — MICROALBUMIN / CREATININE URINE RATIO
Creatinine,U: 49.9 mg/dL
Microalb Creat Ratio: 119.8 mg/g — ABNORMAL HIGH (ref 0.0–30.0)
Microalb, Ur: 59.8 mg/dL — ABNORMAL HIGH (ref 0.0–1.9)

## 2023-04-29 LAB — LIPID PANEL
Cholesterol: 113 mg/dL (ref 0–200)
HDL: 48.2 mg/dL (ref >39.00)
LDL Cholesterol: 49 mg/dL (ref 0–99)
NonHDL: 64.71
Total CHOL/HDL Ratio: 2
Triglycerides: 81 mg/dL (ref 0.0–149.0)
VLDL: 16.2 mg/dL (ref 0.0–40.0)

## 2023-04-29 LAB — HEMOGLOBIN A1C: Hgb A1c MFr Bld: 8 % — ABNORMAL HIGH (ref 4.6–6.5)

## 2023-05-02 ENCOUNTER — Encounter: Payer: Self-pay | Admitting: Endocrinology

## 2023-05-02 ENCOUNTER — Ambulatory Visit (INDEPENDENT_AMBULATORY_CARE_PROVIDER_SITE_OTHER): Payer: Medicare Other | Admitting: Endocrinology

## 2023-05-02 VITALS — BP 140/60 | HR 83 | Ht 59.0 in | Wt 100.2 lb

## 2023-05-02 DIAGNOSIS — E1121 Type 2 diabetes mellitus with diabetic nephropathy: Secondary | ICD-10-CM | POA: Diagnosis not present

## 2023-05-02 DIAGNOSIS — Z794 Long term (current) use of insulin: Secondary | ICD-10-CM | POA: Diagnosis not present

## 2023-05-02 DIAGNOSIS — E7800 Pure hypercholesterolemia, unspecified: Secondary | ICD-10-CM | POA: Diagnosis not present

## 2023-05-02 DIAGNOSIS — E1165 Type 2 diabetes mellitus with hyperglycemia: Secondary | ICD-10-CM

## 2023-05-02 MED ORDER — CLONIDINE HCL 0.1 MG PO TABS: 0.1000 mg | ORAL_TABLET | Freq: Two times a day (BID) | ORAL | 1 refills | Status: DC

## 2023-05-02 NOTE — Progress Notes (Signed)
Patient ID: Mariah Peterson, female   DOB: Apr 04, 1946, 77 y.o.   MRN: [id number]          Reason for Appointment: Follow-up    History of Present Illness:          Date of diagnosis of type 2 diabetes mellitus:?  1990       Background history:   She was started on insulin about 4 years ago and previously taking metformin and glipizide No detailed history is available and she does not think she has taken any other types of diabetes medications or injections  Recent history:    INSULIN regimen is: Lantus 12 units daily in am, Novolog 8 units after breakfast, 6 units at lunch and 6 at supper   Non-insulin hypoglycemic drugs the patient is taking are:   Ozempic 0.25 mg weekly  Her A1c is 8% and better  Last fructosamine 337  Current management, blood sugar patterns and problems identified:   Her freestyle libre was changed to Dexcom to allow for more complete data and she is able to use this now for at least a month  Recently her blood sugars are the highest after breakfast and averaging nearly 200 However she says that she made it to a variable portions of either oatmeal or grits at breakfast; also sometimes especially with oatmeal may not have any protein such as eggs or meat She thinks she is taking her insulin before starting to eat fairly consistently Also frequently has higher readings after dinner Recently overnight readings are fairly well-controlled with low-dose Lantus She does not know why she has lost weight  Overall control is better She was recommended Jardiance but has not been able to get this approved Time in range 75% compared to previous 47%   Breakfast usually 9 am, lunch 1 PM, supper 6 PM   Interpretation of her Dexcom download for the last 2 weeks  Overnight blood sugars are relatively higher at bedtime and usually improved by early morning with lowest AVERAGE 110 around 5 AM and no hypoglycemia  Blood sugars during the day are the highest after  breakfast and occasionally after dinner also Generally Premeal blood sugars are variable around dinnertime including some relatively low readings but this is transient Postprandial readings after breakfast are quite variable but AVERAGE 201 Blood sugars after dinner also not consistent with the range of 104-256 Hypoglycemia only present transiently as above during the day   CGM use % of time 86  2-week average/GV 151/32  Time in range        75  % Time Above 180 20  % Time above 250 4  % Time Below 70 1     She was seen by diabetes educator in 9/19       Side effects from medications have been: None  Compliance with the medical regimen: Fair  Typical meal intake: Breakfast is oatmeal, otherwise eggs and toast.  Lunch may be rice and beans or a sandwich at 4 pm Fruit for snacks                Dietician visit, most recent: 09/2018 CDE consultation: 07/01/2018  Weight history:  Wt Readings from Last 3 Encounters:  05/02/23 100 lb 3.2 oz (45.5 kg)  01/31/23 106 lb (48.1 kg)  11/28/22 114 lb 3.2 oz (51.8 kg)    Glycemic control:   Lab Results  Component Value Date   HGBA1C 8.0 (H) 04/29/2023   HGBA1C 8.5 (A) 01/31/2023  HGBA1C 9.1 (H) 01/30/2023   Lab Results  Component Value Date   MICROALBUR 59.8 (H) 04/29/2023   LDLCALC 49 04/29/2023   CREATININE 1.88 (H) 04/29/2023   Lab Results  Component Value Date   MICRALBCREAT 119.8 (H) 04/29/2023    Lab Results  Component Value Date   FRUCTOSAMINE 346 (H) 05/04/2022   FRUCTOSAMINE 337 (H) 01/23/2022   FRUCTOSAMINE 435 (H) 09/25/2021    Lab on 04/29/2023  Component Date Value Ref Range Status   Microalb, Ur 04/29/2023 59.8 (H)  0.0 - 1.9 mg/dL Final   Creatinine,U 40/98/1191 49.9  mg/dL Final   Microalb Creat Ratio 04/29/2023 119.8 (H)  0.0 - 30.0 mg/g Final   Cholesterol 04/29/2023 113  0 - 200 mg/dL Final   ATP III Classification       Desirable:  < 200 mg/dL               Borderline High:  200 - 239 mg/dL           High:  > = 478 mg/dL   Triglycerides 29/56/2130 81.0  0.0 - 149.0 mg/dL Final   Normal:  <865 mg/dLBorderline High:  150 - 199 mg/dL   HDL 78/46/9629 52.84  >39.00 mg/dL Final   VLDL 13/24/4010 16.2  0.0 - 40.0 mg/dL Final   LDL Cholesterol 04/29/2023 49  0 - 99 mg/dL Final   Total CHOL/HDL Ratio 04/29/2023 2   Final                  Men          Women1/2 Average Risk     3.4          3.3Average Risk          5.0          4.42X Average Risk          9.6          7.13X Average Risk          15.0          11.0                       NonHDL 04/29/2023 64.71   Final   NOTE:  Non-HDL goal should be 30 mg/dL higher than patient's LDL goal (i.e. LDL goal of < 70 mg/dL, would have non-HDL goal of < 100 mg/dL)   Sodium 27/25/3664 403  135 - 145 mEq/L Final   Potassium 04/29/2023 4.4  3.5 - 5.1 mEq/L Final   Chloride 04/29/2023 108  96 - 112 mEq/L Final   CO2 04/29/2023 23  19 - 32 mEq/L Final   Glucose, Bld 04/29/2023 129 (H)  70 - 99 mg/dL Final   BUN 47/42/5956 37 (H)  6 - 23 mg/dL Final   Creatinine, Ser 04/29/2023 1.88 (H)  0.40 - 1.20 mg/dL Final   Total Bilirubin 04/29/2023 0.4  0.2 - 1.2 mg/dL Final   Alkaline Phosphatase 04/29/2023 36 (L)  39 - 117 U/L Final   AST 04/29/2023 28  0 - 37 U/L Final   ALT 04/29/2023 19  0 - 35 U/L Final   Total Protein 04/29/2023 6.9  6.0 - 8.3 g/dL Final   Albumin 38/75/6433 3.8  3.5 - 5.2 g/dL Final   GFR 29/51/8841 25.73 (L)  >60.00 mL/min Final   Calculated using the CKD-EPI Creatinine Equation (2021)   Calcium 04/29/2023 9.8  8.4 - 10.5 mg/dL Final   Hgb  A1c MFr Bld 04/29/2023 8.0 (H)  4.6 - 6.5 % Final   Glycemic Control Guidelines for People with Diabetes:Non Diabetic:  <6%Goal of Therapy: <7%Additional Action Suggested:  >8%     Allergies as of 05/02/2023   No Known Allergies      Medication List        Accurate as of May 02, 2023  4:17 PM. If you have any questions, ask your nurse or doctor.          STOP taking these medications     empagliflozin 10 MG Tabs tablet Commonly known as: Jardiance Stopped by: Reather Littler       TAKE these medications    amLODipine 10 MG tablet Commonly known as: NORVASC Take 1 tablet (10 mg total) by mouth daily.   aspirin EC 81 MG tablet Take 1 tablet (81 mg total) by mouth daily. Swallow whole.   calcium carbonate 1500 (600 Ca) MG Tabs tablet Commonly known as: OSCAL Take 1 tablet by mouth 2 (two) times daily with a meal.   cloNIDine 0.1 MG tablet Commonly known as: CATAPRES Take 1 tablet (0.1 mg total) by mouth 2 (two) times daily.   ezetimibe 10 MG tablet Commonly known as: ZETIA Take 1 tablet by mouth once daily   FreeStyle Libre 2 Sensor Misc CHANGE EVERY 14 DAYS   gabapentin 100 MG capsule Commonly known as: NEURONTIN TAKE 1 CAPSULE BY MOUTH THREE TIMES DAILY   glucose blood test strip Use as instructed to test blood sugar 3 times daily E11.65   IRON PO Take by mouth.   losartan 100 MG tablet Commonly known as: COZAAR Take 1 tablet by mouth once daily   metoprolol tartrate 25 MG tablet Commonly known as: LOPRESSOR Take 1 tablet by mouth twice daily   NovoLOG FlexPen 100 UNIT/ML FlexPen Generic drug: insulin aspart 6 UNITS BEFORE Breakfast on exercise days and if eating eggs 8 Units on other days LUNCH COVERAGE: NOVOLOG 8 UNITS if eating at home and 10 units for fast food Dinner 6 units Novolog before eating   Ozempic (0.25 or 0.5 MG/DOSE) 2 MG/1.5ML Sopn Generic drug: Semaglutide(0.25 or 0.5MG /DOS) Inject 0.5 mg into the skin once a week. 0.25mg  weekly for 4 weeks then 0.5mg  weekly   pioglitazone 15 MG tablet Commonly known as: ACTOS Take 1 tablet by mouth once daily   rosuvastatin 20 MG tablet Commonly known as: CRESTOR Take 1 tablet (20 mg total) by mouth daily.   Semglee (yfgn) 100 UNIT/ML Pen Generic drug: insulin glargine-yfgn INJECT 16 UNITS SUBCUTANEOUSLY ONCE DAILY        Allergies: No Known Allergies  Past Medical History:   Diagnosis Date   Chest pain 11/30/2020   Coronary artery disease    Diabetes mellitus without complication (HCC)    Glaucoma    History of chicken pox    Hypertension     Past Surgical History:  Procedure Laterality Date   BREAST BIOPSY Right 2017   benign   CARDIAC CATHETERIZATION     CATARACT EXTRACTION, BILATERAL Bilateral    CESAREAN SECTION     COLONOSCOPY  1980's   CORONARY ARTERY BYPASS GRAFT N/A 12/05/2020   Procedure: CORONARY ARTERY BYPASS GRAFTING (CABG), ON PUMP, TIMES FOUR, USING LEFT INTERNAL MAMMARY ARTERY AND BILATERAL ENDOSCOPICALLY HARVESTED GREATER SAPHENOUS VEINS;  Surgeon: Corliss Skains, MD;  Location: [facility] OR;  Service: Open Heart Surgery;  Laterality: N/A;   LEFT HEART CATH AND CORONARY ANGIOGRAPHY N/A 11/30/2020   Procedure: LEFT  HEART CATH AND CORONARY ANGIOGRAPHY;  Surgeon: Lyn Records, MD;  Location: [facility] INVASIVE CV LAB;  Service: Cardiovascular;  Laterality: N/A;   TEE WITHOUT CARDIOVERSION N/A 12/05/2020   Procedure: TRANSESOPHAGEAL ECHOCARDIOGRAM (TEE);  Surgeon: Corliss Skains, MD;  Location: [facility] OR;  Service: Open Heart Surgery;  Laterality: N/A;   TUBAL LIGATION      Family History  Problem Relation Age of Onset   Hypertension Mother    Diabetes Brother    CAD Brother    Hypertension Brother    Diabetes Brother    Hypertension Brother    Colon cancer Neg Hx    Esophageal cancer Neg Hx    Pancreatic cancer Neg Hx    Liver disease Neg Hx    Stomach cancer Neg Hx    Rectal cancer Neg Hx    Colon polyps Neg Hx     Social History:  reports that she has never smoked. She has never used smokeless tobacco. She reports that she does not drink alcohol and does not use drugs.   Review of Systems   Lipid history: Currently on Crestor 20 mg Previously had not been taking it regularly with LDL as high as 226 She is also on Zetia  LDL below 70  Lab Results  Component Value Date   CHOL 113 04/29/2023   CHOL 117 08/07/2022   CHOL  128 05/02/2021   Lab Results  Component Value Date   HDL 48.20 04/29/2023   HDL 52.60 08/07/2022   HDL 58.60 05/02/2021   Lab Results  Component Value Date   LDLCALC 49 04/29/2023   LDLCALC 45 08/07/2022   LDLCALC 56 05/02/2021   Lab Results  Component Value Date   TRIG 81.0 04/29/2023   TRIG 100.0 08/07/2022   TRIG 64.0 05/02/2021   Lab Results  Component Value Date   CHOLHDL 2 04/29/2023   CHOLHDL 2 08/07/2022   CHOLHDL 2 05/02/2021   Lab Results  Component Value Date   LDLDIRECT 33.0 01/23/2022            Hypertension: Has been present for several years, generally difficult to control  She is being prescribed amlodipine 10, clonidine and losartan 100 mg This has been adjusted by the nephrologist  Since she had nausea with the clonidine patch and is taking the tablets Taking low-dose metoprolol from PCP also   BP Readings from Last 3 Encounters:  05/02/23 (!) 140/60  02/01/23 129/89  01/31/23 (!) 142/80   CKD: She is followed by nephrologist, has been recommended Jardiance but her application was not completed for some reason  Etiology appears to be diabetes and hypertension  No history of microalbuminuria   Lab Results  Component Value Date   CREATININE 1.88 (H) 04/29/2023   CREATININE 1.32 (H) 01/31/2023   CREATININE 1.28 (H) 01/30/2023    Most recent eye exam was in 2/24  Most recent foot exam: 4/24   NEUROPATHY: She has been complaining of burning in her feet especially at night, better on gabapentin  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, nephropathy  LABS:  Lab on 04/29/2023  Component Date Value Ref Range Status   Microalb, Ur 04/29/2023 59.8 (H)  0.0 - 1.9 mg/dL Final   Creatinine,U 54/06/8118 49.9  mg/dL Final   Microalb Creat Ratio 04/29/2023 119.8 (H)  0.0 - 30.0 mg/g Final   Cholesterol 04/29/2023 113  0 - 200 mg/dL Final   ATP III Classification       Desirable:  <  200 mg/dL               Borderline  High:  200 - 239 mg/dL          High:  > = 409 mg/dL   Triglycerides 81/19/1478 81.0  0.0 - 149.0 mg/dL Final   Normal:  <295 mg/dLBorderline High:  150 - 199 mg/dL   HDL 62/13/0865 78.46  >39.00 mg/dL Final   VLDL 96/29/5284 16.2  0.0 - 40.0 mg/dL Final   LDL Cholesterol 04/29/2023 49  0 - 99 mg/dL Final   Total CHOL/HDL Ratio 04/29/2023 2   Final                  Men          Women1/2 Average Risk     3.4          3.3Average Risk          5.0          4.42X Average Risk          9.6          7.13X Average Risk          15.0          11.0                       NonHDL 04/29/2023 64.71   Final   NOTE:  Non-HDL goal should be 30 mg/dL higher than patient's LDL goal (i.e. LDL goal of < 70 mg/dL, would have non-HDL goal of < 100 mg/dL)   Sodium 13/24/4010 272  135 - 145 mEq/L Final   Potassium 04/29/2023 4.4  3.5 - 5.1 mEq/L Final   Chloride 04/29/2023 108  96 - 112 mEq/L Final   CO2 04/29/2023 23  19 - 32 mEq/L Final   Glucose, Bld 04/29/2023 129 (H)  70 - 99 mg/dL Final   BUN 53/66/4403 37 (H)  6 - 23 mg/dL Final   Creatinine, Ser 04/29/2023 1.88 (H)  0.40 - 1.20 mg/dL Final   Total Bilirubin 04/29/2023 0.4  0.2 - 1.2 mg/dL Final   Alkaline Phosphatase 04/29/2023 36 (L)  39 - 117 U/L Final   AST 04/29/2023 28  0 - 37 U/L Final   ALT 04/29/2023 19  0 - 35 U/L Final   Total Protein 04/29/2023 6.9  6.0 - 8.3 g/dL Final   Albumin 47/42/5956 3.8  3.5 - 5.2 g/dL Final   GFR 38/75/6433 25.73 (L)  >60.00 mL/min Final   Calculated using the CKD-EPI Creatinine Equation (2021)   Calcium 04/29/2023 9.8  8.4 - 10.5 mg/dL Final   Hgb I9J MFr Bld 04/29/2023 8.0 (H)  4.6 - 6.5 % Final   Glycemic Control Guidelines for People with Diabetes:Non Diabetic:  <6%Goal of Therapy: <7%Additional Action Suggested:  >8%     Physical Examination:  BP (!) 140/60 (BP Location: Left Arm, Patient Position: Sitting, Cuff Size: Large)   Pulse 83   Ht 4\' 11"  (1.499 m)   Wt 100 lb 3.2 oz (45.5 kg)   SpO2 99%   BMI  20.24 kg/m   1+ ankle edema present  ASSESSMENT:  Diabetes type 2, insulin-dependent    A1c is 8.0  See history of present illness for discussion of current diabetes management, blood sugar patterns and problems identified  She is on basal bolus insulin, Actos 15 mg and low-dose Ozempic  With better monitoring using the Dexcom her overall control is better  She is fairly consistent with her insulin as directed but has not increased her dose especially at breakfast as directed However may be losing weight with decreased appetite for unknown reasons Her blood sugars are the highest after breakfast but fluctuate because of variable appetite and occasionally not getting protein  HYPERTENSION: Blood pressure is overall better controlled On multiple medications as before  LIPIDS: Well-controlled  PLAN:   Continue Ozempic 0.25 mg weekly She needs to take up to 10 units of NovoLog at breakfast at least when eating a full meal She needs to add a protein to breakfast consistently Also likely needs 1 to 2 units more to cover her evening meal when she is eating more carbohydrate Continue same dose of Lantus  Will try to get approval for Farxiga instead of Jardiance as this may be more easy to process  Discussed blood sugar targets at different times  For hypercholesterolemia she will continue the same treatment  She will follow-up with her nephrologist regularly    Patient Instructions  8-10 Novolog at breakfast    Total visit time for evaluation and management of multiple problems and counseling = 40 minutes  Reather Littler 05/02/2023, 4:17 PM   Note: This office note was prepared with Dragon voice recognition system technology. Any transcriptional errors that result from this process are unintentional.

## 2023-05-02 NOTE — Patient Instructions (Signed)
8-10 Novolog at breakfast

## 2023-05-06 ENCOUNTER — Encounter: Payer: Self-pay | Admitting: Endocrinology

## 2023-06-19 ENCOUNTER — Other Ambulatory Visit: Payer: Self-pay | Admitting: Endocrinology

## 2023-06-20 ENCOUNTER — Other Ambulatory Visit: Payer: Self-pay

## 2023-06-20 DIAGNOSIS — Z794 Long term (current) use of insulin: Secondary | ICD-10-CM

## 2023-06-20 MED ORDER — NOVOLOG FLEXPEN 100 UNIT/ML ~~LOC~~ SOPN
PEN_INJECTOR | SUBCUTANEOUS | 3 refills | Status: DC
Start: 2023-06-20 — End: 2023-08-07

## 2023-07-08 ENCOUNTER — Ambulatory Visit: Payer: Medicare Other | Admitting: Cardiology

## 2023-07-11 ENCOUNTER — Other Ambulatory Visit: Payer: Self-pay

## 2023-07-11 ENCOUNTER — Encounter: Payer: Self-pay | Admitting: *Deleted

## 2023-07-11 ENCOUNTER — Ambulatory Visit
Admission: EM | Admit: 2023-07-11 | Discharge: 2023-07-11 | Disposition: A | Discharge status: Home / Self Care | Payer: Medicare Other | Attending: Physician Assistant | Admitting: Physician Assistant

## 2023-07-11 DIAGNOSIS — U071 COVID-19: Secondary | ICD-10-CM

## 2023-07-11 MED ORDER — MOLNUPIRAVIR EUA 200MG CAPSULE: 4.0000 | ORAL_CAPSULE | Freq: Two times a day (BID) | ORAL | 0 refills | Status: AC

## 2023-07-11 MED ORDER — ACETAMINOPHEN 325 MG PO TABS
650.0000 mg | ORAL_TABLET | Freq: Once | ORAL | Status: AC
  Administered 2023-07-11: 650 mg via ORAL

## 2023-07-11 NOTE — ED Triage Notes (Signed)
Pt reports cough and chills since yesterday. Today she had +home covid test. Alert, NAD

## 2023-07-19 NOTE — ED Provider Notes (Signed)
[facility]    CSN: [id number] Arrival date & time: 07/11/23  1646      History   Chief Complaint Chief Complaint  Patient presents with   COVID19    + @ home Test.    HPI Mariah Peterson is a 77 y.o. female.   Patient here today for evaluation of cough and chills started yesterday.  She notes that today she took a COVID test and was positive.  She denies any vomiting or diarrhea.  She has taken over-the-counter medication with mild relief.  The history is provided by the patient.    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   Chest pain 11/29/2020   NSTEMI (non-ST elevated myocardial infarction) (HCC)    Glaucoma 03/31/2019   Healthcare maintenance 12/03/2018   Anemia 12/03/2018   Hypertension 10/31/2018   Hyperlipidemia 08/04/2013   Diabetes mellitus (HCC) 08/04/2013    Past Surgical History:  Procedure Laterality Date   BREAST BIOPSY Right 2017   benign   CARDIAC CATHETERIZATION     CATARACT EXTRACTION, BILATERAL Bilateral    CESAREAN SECTION     COLONOSCOPY  1980's   CORONARY ARTERY BYPASS GRAFT N/A 12/05/2020   Procedure: CORONARY ARTERY BYPASS GRAFTING (CABG), ON PUMP, TIMES FOUR, USING LEFT INTERNAL MAMMARY ARTERY AND BILATERAL ENDOSCOPICALLY HARVESTED GREATER SAPHENOUS VEINS;  Surgeon: Corliss Skains, MD;  Location: [facility] OR;  Service: Open Heart Surgery;  Laterality: N/A;   LEFT HEART CATH AND CORONARY ANGIOGRAPHY N/A 11/30/2020   Procedure: LEFT HEART CATH AND CORONARY ANGIOGRAPHY;  Surgeon: Lyn Records, MD;  Location: [facility] INVASIVE CV LAB;  Service: Cardiovascular;  Laterality: N/A;   TEE WITHOUT CARDIOVERSION N/A 12/05/2020   Procedure: TRANSESOPHAGEAL  ECHOCARDIOGRAM (TEE);  Surgeon: Corliss Skains, MD;  Location: [facility] OR;  Service: Open Heart Surgery;  Laterality: N/A;   TUBAL LIGATION      OB History   No obstetric history on file.      Home Medications    Prior to Admission medications   Medication Sig Start Date End Date Taking? Authorizing Provider  amLODipine (NORVASC) 10 MG tablet Take 1 tablet (10 mg total) by mouth daily. 11/28/22  Yes Reather Littler, MD  aspirin EC 81 MG tablet Take 1 tablet (81 mg total) by mouth daily. Swallow whole. 03/22/21  Yes Jake Bathe, MD  calcium carbonate (OSCAL) 1500 (600 Ca) MG TABS tablet Take 1 tablet by mouth 2 (two) times daily with a meal.   Yes [provider]  cloNIDine (CATAPRES) 0.1 MG tablet Take 1 tablet (0.1 mg total) by mouth 2 (two) times daily. 05/02/23  Yes Reather Littler, MD  Continuous Blood Gluc Sensor (FREESTYLE LIBRE 2 SENSOR) MISC CHANGE EVERY 14 DAYS 04/20/22  Yes Reather Littler, MD  ezetimibe (ZETIA) 10 MG tablet Take 1 tablet by mouth once daily 06/19/23  Yes Reather Littler, MD  Ferrous Sulfate (IRON PO) Take by mouth.   Yes [provider]  gabapentin (NEURONTIN) 100 MG capsule TAKE 1 CAPSULE BY MOUTH THREE TIMES DAILY 04/24/23  Yes Reather Littler, MD  glucose blood test strip Use as instructed to test blood sugar  3 times daily E11.65 11/24/18  Yes Reather Littler, MD  insulin aspart (NOVOLOG FLEXPEN) 100 UNIT/ML FlexPen 6 UNITS BEFORE Breakfast on exercise days and if eating eggs 8 Units on other days LUNCH COVERAGE: NOVOLOG 8 UNITS if eating at home and 10 units for fast food Dinner 6 units Novolog before eating 06/20/23  Yes Reather Littler, MD  insulin glargine-yfgn (SEMGLEE, YFGN,) 100 UNIT/ML Pen INJECT 16 UNITS SUBCUTANEOUSLY ONCE DAILY 10/08/22  Yes Reather Littler, MD  losartan (COZAAR) 100 MG tablet Take 1 tablet by mouth once daily 10/04/22  Yes Philip Aspen, Limmie Patricia, MD  metoprolol tartrate (LOPRESSOR) 25 MG tablet Take 1 tablet by mouth twice daily 01/23/23  Yes  Philip Aspen, Limmie Patricia, MD  pioglitazone (ACTOS) 15 MG tablet Take 1 tablet by mouth once daily 12/31/22  Yes Reather Littler, MD  rosuvastatin (CRESTOR) 20 MG tablet Take 1 tablet (20 mg total) by mouth daily. 02/26/23  Yes Reather Littler, MD  Semaglutide,0.25 or 0.5MG /DOS, (OZEMPIC, 0.25 OR 0.5 MG/DOSE,) 2 MG/1.5ML SOPN Inject 0.5 mg into the skin once a week. 0.25mg  weekly for 4 weeks then 0.5mg  weekly 05/02/21  Yes Reather Littler, MD    Family History Family History  Problem Relation Age of Onset   Hypertension Mother    Diabetes Brother    CAD Brother    Hypertension Brother    Diabetes Brother    Hypertension Brother    Colon cancer Neg Hx    Esophageal cancer Neg Hx    Pancreatic cancer Neg Hx    Liver disease Neg Hx    Stomach cancer Neg Hx    Rectal cancer Neg Hx    Colon polyps Neg Hx     Social History Social History   Tobacco Use   Smoking status: Never   Smokeless tobacco: Never  Vaping Use   Vaping status: Never Used  Substance Use Topics   Alcohol use: No   Drug use: No     Allergies   Patient has no known allergies.   Review of Systems Review of Systems  Constitutional:  Positive for chills and fever.  HENT:  Positive for congestion and sore throat. Negative for ear pain.   Eyes:  Negative for discharge and redness.  Respiratory:  Positive for cough. Negative for shortness of breath and wheezing.   Gastrointestinal:  Negative for abdominal pain, diarrhea, nausea and vomiting.     Physical Exam Triage Vital Signs ED Triage Vitals [07/11/23 1801]  Encounter Vitals Group     BP 138/83     Systolic BP Percentile      Diastolic BP Percentile      Pulse Rate 90     Resp 20     Temp (!) 102.6 F (39.2 C)     Temp Source Oral     SpO2 100 %     Weight      Height      Head Circumference      Peak Flow      Pain Score 2     Pain Loc      Pain Education      Exclude from Growth Chart    No data found.  Updated Vital Signs BP 138/83 (BP  Location: Left Arm)   Pulse 79   Temp (!) 100.4 F (38 C) (Oral)   Resp 18   SpO2 98%      Physical Exam Vitals and nursing note reviewed.  Constitutional:  General: She is not in acute distress.    Appearance: Normal appearance. She is not ill-appearing.  HENT:     Head: Normocephalic and atraumatic.     Nose: Congestion present.     Mouth/Throat:     Mouth: Mucous membranes are moist.     Pharynx: No oropharyngeal exudate or posterior oropharyngeal erythema.  Eyes:     Conjunctiva/sclera: Conjunctivae normal.  Cardiovascular:     Rate and Rhythm: Normal rate and regular rhythm.     Heart sounds: Normal heart sounds. No murmur heard. Pulmonary:     Effort: Pulmonary effort is normal. No respiratory distress.     Breath sounds: Normal breath sounds. No wheezing, rhonchi or rales.  Skin:    General: Skin is warm and dry.  Neurological:     Mental Status: She is alert.  Psychiatric:        Mood and Affect: Mood normal.        Thought Content: Thought content normal.      UC Treatments / Results  Labs (all labs ordered are listed, but only abnormal results are displayed) Labs Reviewed - No data to display  EKG   Radiology No results found.  Procedures Procedures (including critical care time)  Medications Ordered in UC Medications  acetaminophen (TYLENOL) tablet 650 mg (650 mg Oral Given 07/11/23 1809)    Initial Impression / Assessment and Plan / UC Course  I have reviewed the triage vital signs and the nursing notes.  Pertinent labs & imaging results that were available during my care of the patient were reviewed by me and considered in my medical decision making (see chart for details).    Patient not a candidate for Paxlovid, will treat with molnupiravir at patient's request.  Encouraged symptomatic treatment otherwise with increase fluids and rest.  Recommended follow-up if no gradual improvement with any further concerns.  Final Clinical  Impressions(s) / UC Diagnoses   Final diagnoses:  COVID-19   Discharge Instructions   None    ED Prescriptions     Medication Sig Dispense Auth. Provider   molnupiravir EUA (LAGEVRIO) 200 mg CAPS capsule Take 4 capsules (800 mg total) by mouth 2 (two) times daily for 5 days. 40 capsule Tomi Bamberger, PA-C      PDMP not reviewed this encounter.   Tomi Bamberger, PA-C 07/19/23 2004

## 2023-07-29 ENCOUNTER — Other Ambulatory Visit (INDEPENDENT_AMBULATORY_CARE_PROVIDER_SITE_OTHER): Payer: Medicare Other

## 2023-07-29 DIAGNOSIS — Z794 Long term (current) use of insulin: Secondary | ICD-10-CM | POA: Diagnosis not present

## 2023-07-29 DIAGNOSIS — E1165 Type 2 diabetes mellitus with hyperglycemia: Secondary | ICD-10-CM

## 2023-07-29 LAB — BASIC METABOLIC PANEL
BUN: 24 mg/dL — ABNORMAL HIGH (ref 6–23)
CO2: 22 meq/L (ref 19–32)
Calcium: 9.4 mg/dL (ref 8.4–10.5)
Chloride: 107 meq/L (ref 96–112)
Creatinine, Ser: 1.41 mg/dL — ABNORMAL HIGH (ref 0.40–1.20)
GFR: 36.27 mL/min — ABNORMAL LOW (ref >60.00)
Glucose, Bld: 169 mg/dL — ABNORMAL HIGH (ref 70–99)
Potassium: 4.2 meq/L (ref 3.5–5.1)
Sodium: 140 meq/L (ref 135–145)

## 2023-07-29 LAB — HEMOGLOBIN A1C: Hgb A1c MFr Bld: 8.8 % — ABNORMAL HIGH (ref 4.6–6.5)

## 2023-07-30 LAB — FRUCTOSAMINE: Fructosamine: 353 umol/L — ABNORMAL HIGH (ref 0–285)

## 2023-08-07 ENCOUNTER — Encounter: Payer: Self-pay | Admitting: Endocrinology

## 2023-08-07 ENCOUNTER — Ambulatory Visit: Payer: Medicare Other | Admitting: Endocrinology

## 2023-08-07 VITALS — BP 178/80 | HR 80 | Resp 20 | Ht 59.0 in | Wt 96.8 lb

## 2023-08-07 DIAGNOSIS — E1121 Type 2 diabetes mellitus with diabetic nephropathy: Secondary | ICD-10-CM | POA: Diagnosis not present

## 2023-08-07 DIAGNOSIS — Z23 Encounter for immunization: Secondary | ICD-10-CM

## 2023-08-07 DIAGNOSIS — E1165 Type 2 diabetes mellitus with hyperglycemia: Secondary | ICD-10-CM | POA: Diagnosis not present

## 2023-08-07 DIAGNOSIS — Z794 Long term (current) use of insulin: Secondary | ICD-10-CM | POA: Diagnosis not present

## 2023-08-07 DIAGNOSIS — E1140 Type 2 diabetes mellitus with diabetic neuropathy, unspecified: Secondary | ICD-10-CM

## 2023-08-07 DIAGNOSIS — E1169 Type 2 diabetes mellitus with other specified complication: Secondary | ICD-10-CM

## 2023-08-07 MED ORDER — NOVOLOG FLEXPEN 100 UNIT/ML ~~LOC~~ SOPN
PEN_INJECTOR | SUBCUTANEOUS | 3 refills | Status: DC
Start: 2023-08-07 — End: 2024-08-11

## 2023-08-07 MED ORDER — SEMAGLUTIDE(0.25 OR 0.5MG/DOS) 2 MG/3ML ~~LOC~~ SOPN
0.5000 mg | PEN_INJECTOR | SUBCUTANEOUS | 3 refills | Status: DC
Start: 2023-08-07 — End: 2024-08-28

## 2023-08-07 NOTE — Patient Instructions (Signed)
Diabetes regimen: Semeglee long acting 16 units daily. Novolog 10 units with breakfast and 8-10 units with lunch and dinner. Increase Ozempic to 0.5 mg weekly.

## 2023-08-07 NOTE — Progress Notes (Signed)
Outpatient Endocrinology Note [place] Manly Nestle, MD  08/07/23  Patient's Name: Mariah Peterson    DOB: 1946/03/04    MRN: [id number]                                                    REASON OF VISIT: Follow-up for type 2 diabetes mellitus  PCP: Philip Aspen, Limmie Patricia, MD  HISTORY OF PRESENT ILLNESS:   Mariah Peterson is a 77 y.o. old female with past medical history listed below, is here for follow-up follow up for type 2 diabetes mellitus.  Patient was last seen by Dr. Lucianne Muss in July 2024.  Pertinent Diabetes History: Patient was diagnosed with type 2 diabetes mellitus around 1990s.  She was restarted on insulin therapy around 2020.  Chronic Diabetes Complications : Retinopathy: no. Last ophthalmology exam was done on annually reportedly, last exam in February 2024. Nephropathy: CKD, on losartan, following with nephrology.  Most recent diabetic foot exam February 2024. Peripheral neuropathy: yes, on gabapentin. Coronary artery disease: yes Stroke: no  Relevant comorbidities and cardiovascular risk factors: Obesity: no Body mass index is 19.55 kg/m.  Hypertension: yes Hyperlipidemia. Yes, on statin and zetia.   Current / Home Diabetic regimen includes: Lantus 16 units daily in the morning Novolog 10 units with BF, 6/8 with lunch or supper. Ozempic 0.25mg  weekly.  Actos 15mg  daily.  Marcelline Deist and Jardiance planned in the past, not cost effective.  Prior diabetic medications:  Glycemic data:    CONTINUOUS GLUCOSE MONITORING SYSTEM (CGMS) INTERPRETATION: At today's visit, we reviewed CGM downloads. The full report is scanned in the media. Reviewing the CGM trends, blood glucose are as follows:  Dexcom G7 CGM-  Sensor Download (Sensor download was reviewed and summarized below.) Dates: September 28 to August 02, 2023  Glucose Management Indicator: n/a% Sensor Average: 239 SD 62 Sensor use is 71% 10 out of 14 days.  Glycemic Trends:  <54: 0% 54-70: 0% 71-180:  19% 181-250: 39% 251-400: 42%  Interpretation: -Mostly hyperglycemia with blood sugar up to 300-350 range especially with lunch, supper and bedtime, related with meals.  Blood sugar acceptable mostly with the breakfast.  Blood sugar trending down overnight and by the morning.  No hypoglycemia.  Hypoglycemia: Patient has no hypoglycemic episodes. Patient has hypoglycemia awareness.  Factors modifying glucose control: 1.  Diabetic diet assessment: 3 meals a day.  Reviewed supportive snacks.  2.  Staying active or exercising:   3.  Medication compliance: compliant most of the time.  Interval history 08/07/23 Dexcom CGM data as reviewed above.  Recent hemoglobin A1c 8.8% worsening.  Mostly hyperglycemia.  She complains of numbness and tingling of the feet.  Denies any vision problem.  No other complaints today.  She wants flu shot in the clinic today.  REVIEW OF SYSTEMS As per history of present illness.   PAST MEDICAL HISTORY: Past Medical History:  Diagnosis Date   Chest pain 11/30/2020   Coronary artery disease    Diabetes mellitus without complication (HCC)    Glaucoma    History of chicken pox    Hypertension     PAST SURGICAL HISTORY: Past Surgical History:  Procedure Laterality Date   BREAST BIOPSY Right 2017   benign   CARDIAC CATHETERIZATION     CATARACT EXTRACTION, BILATERAL Bilateral    CESAREAN SECTION  COLONOSCOPY  1980's   CORONARY ARTERY BYPASS GRAFT N/A 12/05/2020   Procedure: CORONARY ARTERY BYPASS GRAFTING (CABG), ON PUMP, TIMES FOUR, USING LEFT INTERNAL MAMMARY ARTERY AND BILATERAL ENDOSCOPICALLY HARVESTED GREATER SAPHENOUS VEINS;  Surgeon: Corliss Skains, MD;  Location: [facility] OR;  Service: Open Heart Surgery;  Laterality: N/A;   LEFT HEART CATH AND CORONARY ANGIOGRAPHY N/A 11/30/2020   Procedure: LEFT HEART CATH AND CORONARY ANGIOGRAPHY;  Surgeon: Lyn Records, MD;  Location: [facility] INVASIVE CV LAB;  Service: Cardiovascular;  Laterality: N/A;   TEE  WITHOUT CARDIOVERSION N/A 12/05/2020   Procedure: TRANSESOPHAGEAL ECHOCARDIOGRAM (TEE);  Surgeon: Corliss Skains, MD;  Location: [facility] OR;  Service: Open Heart Surgery;  Laterality: N/A;   TUBAL LIGATION      ALLERGIES: No Known Allergies  FAMILY HISTORY:  Family History  Problem Relation Age of Onset   Hypertension Mother    Diabetes Brother    CAD Brother    Hypertension Brother    Diabetes Brother    Hypertension Brother    Colon cancer Neg Hx    Esophageal cancer Neg Hx    Pancreatic cancer Neg Hx    Liver disease Neg Hx    Stomach cancer Neg Hx    Rectal cancer Neg Hx    Colon polyps Neg Hx     SOCIAL HISTORY: Social History   Socioeconomic History   Marital status: Married    Spouse name: Not on file   Number of children: 1   Years of education: 12   Highest education level: High school graduate  Occupational History   Occupation: Retired  Tobacco Use   Smoking status: Never   Smokeless tobacco: Never  Vaping Use   Vaping status: Never Used  Substance and Sexual Activity   Alcohol use: No   Drug use: No   Sexual activity: Yes  Other Topics Concern   Not on file  Social History Narrative   Not on file   Social Determinants of Health   Financial Resource Strain: Low Risk  (06/27/2022)   Overall Financial Resource Strain (CARDIA)    Difficulty of Paying Living Expenses: Not hard at all  Food Insecurity: No Food Insecurity (02/26/2023)   Hunger Vital Sign    Worried About Running Out of Food in the Last Year: Never true    Ran Out of Food in the Last Year: Never true  Transportation Needs: No Transportation Needs (06/27/2022)   PRAPARE - Administrator, Civil Service (Medical): No    Lack of Transportation (Non-Medical): No  Physical Activity: Insufficiently Active (06/27/2022)   Exercise Vital Sign    Days of Exercise per Week: 3 days    Minutes of Exercise per Session: 30 min  Stress: No Stress Concern Present (06/27/2022)   [employer] of Occupational Health - Occupational Stress Questionnaire    Feeling of Stress : Not at all  Social Connections: Socially Integrated (06/27/2022)   Social Connection and Isolation Panel [NHANES]    Frequency of Communication with Friends and Family: More than three times a week    Frequency of Social Gatherings with Friends and Family: More than three times a week    Attends Religious Services: More than 4 times per year    Active Member of [employer] or Organizations: Yes    Attends Engineer, structural: More than 4 times per year    Marital Status: Married    MEDICATIONS:  Current Outpatient Medications  Medication Sig Dispense  Refill   amLODipine (NORVASC) 10 MG tablet Take 1 tablet (10 mg total) by mouth daily. 90 tablet 0   aspirin EC 81 MG tablet Take 1 tablet (81 mg total) by mouth daily. Swallow whole. 90 tablet 3   calcium carbonate (OSCAL) 1500 (600 Ca) MG TABS tablet Take 1 tablet by mouth 2 (two) times daily with a meal.     cloNIDine (CATAPRES) 0.1 MG tablet Take 1 tablet (0.1 mg total) by mouth 2 (two) times daily. 60 tablet 1   Continuous Blood Gluc Sensor (FREESTYLE LIBRE 2 SENSOR) MISC CHANGE EVERY 14 DAYS 2 each 0   ezetimibe (ZETIA) 10 MG tablet Take 1 tablet by mouth once daily 90 tablet 0   Ferrous Sulfate (IRON PO) Take by mouth.     gabapentin (NEURONTIN) 100 MG capsule TAKE 1 CAPSULE BY MOUTH THREE TIMES DAILY 90 capsule 0   glucose blood test strip Use as instructed to test blood sugar 3 times daily E11.65 270 each 1   insulin glargine-yfgn (SEMGLEE, YFGN,) 100 UNIT/ML Pen INJECT 16 UNITS SUBCUTANEOUSLY ONCE DAILY 15 mL 2   losartan (COZAAR) 100 MG tablet Take 1 tablet by mouth once daily 90 tablet 1   metoprolol tartrate (LOPRESSOR) 25 MG tablet Take 1 tablet by mouth twice daily 180 tablet 1   pioglitazone (ACTOS) 15 MG tablet Take 1 tablet by mouth once daily 30 tablet 5   rosuvastatin (CRESTOR) 20 MG tablet Take 1 tablet (20 mg total) by mouth  daily. 90 tablet 1   Semaglutide,0.25 or 0.5MG /DOS, 2 MG/3ML SOPN Inject 0.5 mg into the skin once a week. 9 mL 3   insulin aspart (NOVOLOG FLEXPEN) 100 UNIT/ML FlexPen 8 UNITS BEFORE Breakfast on exercise days and if eating eggs 10 Units on other days LUNCH COVERAGE: NOVOLOG 8 UNITS if eating at home and 10 units for fast food Dinner 8-10 units Novolog before eating 15 mL 3   Current Facility-Administered Medications  Medication Dose Route Frequency Provider Last Rate Last Admin   diclofenac Sodium (VOLTAREN) 1 % topical gel 2 g  2 g Topical TID Ardelle Balls, PA-C        PHYSICAL EXAM: Vitals:   08/07/23 0933 08/07/23 0935  BP: (!) 168/82 (!) 178/80  Pulse: 80   Resp: 20   SpO2: 96%   Weight: 96 lb 12.8 oz (43.9 kg)   Height: 4\' 11"  (1.499 m)    Body mass index is 19.55 kg/m.  Wt Readings from Last 3 Encounters:  08/07/23 96 lb 12.8 oz (43.9 kg)  05/02/23 100 lb 3.2 oz (45.5 kg)  01/31/23 106 lb (48.1 kg)    General: Well developed, well nourished female in no apparent distress.  HEENT: AT/[place], no external lesions.  Eyes: Conjunctiva clear and no icterus. Neck: Neck supple  Lungs: Respirations not labored Neurologic: Alert, oriented, normal speech Extremities / Skin: Dry.  Psychiatric: Does not appear depressed or anxious  Diabetic Foot Exam - Simple   No data filed    LABS Reviewed Lab Results  Component Value Date   HGBA1C 8.8 (H) 07/29/2023   HGBA1C 8.0 (H) 04/29/2023   HGBA1C 8.5 (A) 01/31/2023   Lab Results  Component Value Date   FRUCTOSAMINE 353 (H) 07/29/2023   FRUCTOSAMINE 346 (H) 05/04/2022   FRUCTOSAMINE 337 (H) 01/23/2022   Lab Results  Component Value Date   CHOL 113 04/29/2023   HDL 48.20 04/29/2023   LDLCALC 49 04/29/2023   LDLDIRECT 33.0 01/23/2022  TRIG 81.0 04/29/2023   CHOLHDL 2 04/29/2023   Lab Results  Component Value Date   MICRALBCREAT 119.8 (H) 04/29/2023   MICRALBCREAT 51.5 (H) 11/28/2021   Lab Results  Component  Value Date   CREATININE 1.41 (H) 07/29/2023   Lab Results  Component Value Date   GFR 36.27 (L) 07/29/2023    ASSESSMENT / PLAN  1. Type 2 diabetes mellitus with other specified complication, with long-term current use of insulin (HCC)   2. Encounter for immunization   3. Uncontrolled type 2 diabetes mellitus with hyperglycemia, with long-term current use of insulin (HCC)   4. Diabetic nephropathy associated with type 2 diabetes mellitus (HCC)   5. Type 2 diabetes mellitus with diabetic neuropathy, with long-term current use of insulin (HCC)     Diabetes Mellitus type 2, complicated by diabetic neuropathy and nephropathy. - Diabetic status / severity: Uncontrolled.  Lab Results  Component Value Date   HGBA1C 8.8 (H) 07/29/2023    - Hemoglobin A1c goal : <7.5%  Discussed about portion control and limiting carbohydrate in the meals.  Discussed about avoiding frequently snack and bedtime snack.  - Medications: See below.  I) continue Semglee 16 units daily. II) continue NovoLog 10 units with breakfast and increase NovoLog 8 to 10 units with lunch and dinner instead of 6 to 8 units. III) increase Ozempic to 0.5 mg weekly.  If she loses significant amount of weight on increased dose of Ozempic we will back down on the 0.25 mg weekly.  - Home glucose testing: CGM Dexcom and check as needed. - Discussed/ Gave Hypoglycemia treatment plan.  # Consult : not required at this time.   # Annual urine for microalbuminuria/ creatinine ratio, no microalbuminuria currently, continue ACE/ARB/losartan.  She has CKD following with nephrology. Last  Lab Results  Component Value Date   MICRALBCREAT 119.8 (H) 04/29/2023    # Foot check nightly / neuropathy, continue gabapentin.  # Annual dilated diabetic eye exams.   - Diet: Make healthy diabetic food choices - Life style / activity / exercise: Discussed.  2. Blood pressure  -  BP Readings from Last 1 Encounters:  08/07/23 (!)  178/80    - Control is not in target.  - No change in current plans.  Advised to discuss with primary care provider and nephrology.  3. Lipid status / Hyperlipidemia - Last  Lab Results  Component Value Date   LDLCALC 49 04/29/2023   - Continue rosuvastatin 20 mg daily.  Zetia 10 mg daily.  Diagnoses and all orders for this visit:  Type 2 diabetes mellitus with other specified complication, with long-term current use of insulin (HCC) -     insulin aspart (NOVOLOG FLEXPEN) 100 UNIT/ML FlexPen; 8 UNITS BEFORE Breakfast on exercise days and if eating eggs 10 Units on other days LUNCH COVERAGE: NOVOLOG 8 UNITS if eating at home and 10 units for fast food Dinner 8-10 units Novolog before eating -     Basic metabolic panel; Future -     Hemoglobin A1c; Future  Encounter for immunization -     Flu Vaccine Trivalent High Dose (Fluad) -     Flu Vaccine Trivalent High Dose (Fluad)  Uncontrolled type 2 diabetes mellitus with hyperglycemia, with long-term current use of insulin (HCC)  Diabetic nephropathy associated with type 2 diabetes mellitus (HCC)  Type 2 diabetes mellitus with diabetic neuropathy, with long-term current use of insulin (HCC)  Other orders -     Semaglutide,0.25 or 0.5MG /DOS, 2  MG/3ML SOPN; Inject 0.5 mg into the skin once a week.    DISPOSITION Follow up in clinic in 3 months suggested.   All questions answered and patient verbalized understanding of the plan.  [place] Brennley Curtice, MD [facility] Endocrinology [facility] [address] [place], [address] [place], [place] [address] Phone # [phone]  At least part of this note was generated using voice recognition software. Inadvertent word errors may have occurred, which were not recognized during the proofreading process.

## 2023-08-12 ENCOUNTER — Encounter: Payer: Self-pay | Admitting: Endocrinology

## 2023-08-14 ENCOUNTER — Telehealth: Payer: Self-pay

## 2023-08-14 NOTE — Progress Notes (Signed)
08/14/2023  Patient ID: Mariah Peterson, female   DOB: October 12, 1946, 77 y.o.   MRN: [id number]  Attempted to contact patient to schedule f/u appointment for medication management. Left HIPAA compliant message for patient to return my call at their convenience.   Sherrill Raring, PharmD Clinical Pharmacist [phone]

## 2023-09-25 ENCOUNTER — Encounter: Payer: Self-pay | Admitting: Cardiology

## 2023-09-25 ENCOUNTER — Ambulatory Visit: Payer: Medicare Other | Attending: Cardiology | Admitting: Cardiology

## 2023-09-25 VITALS — BP 150/58 | HR 83 | Ht 59.0 in | Wt 95.4 lb

## 2023-09-25 DIAGNOSIS — I10 Essential (primary) hypertension: Secondary | ICD-10-CM | POA: Diagnosis present

## 2023-09-25 DIAGNOSIS — E0800 Diabetes mellitus due to underlying condition with hyperosmolarity without nonketotic hyperglycemic-hyperosmolar coma (NKHHC): Secondary | ICD-10-CM | POA: Diagnosis present

## 2023-09-25 DIAGNOSIS — Z951 Presence of aortocoronary bypass graft: Secondary | ICD-10-CM | POA: Diagnosis present

## 2023-09-25 DIAGNOSIS — E782 Mixed hyperlipidemia: Secondary | ICD-10-CM | POA: Insufficient documentation

## 2023-09-25 NOTE — Patient Instructions (Signed)
Medication Instructions:  Your physician recommends that you continue on your current medications as directed. Please refer to the Current Medication list given to you today.  *If you need a refill on your cardiac medications before your next appointment, please call your pharmacy*  Lab Work: None ordered today.  Testing/Procedures: None ordered today.  Follow-Up: At [facility], you and your health needs are our priority.  As part of our continuing mission to provide you with exceptional heart care, we have created designated Provider Care Teams.  These Care Teams include your primary Cardiologist (physician) and Advanced Practice Providers (APPs -  Physician Assistants and Nurse Practitioners) who all work together to provide you with the care you need, when you need it.  We recommend signing up for the patient portal called "MyChart".  Sign up information is provided on this After Visit Summary.  MyChart is used to connect with patients for Virtual Visits (Telemedicine).  Patients are able to view lab/test results, encounter notes, upcoming appointments, etc.  Non-urgent messages can be sent to your provider as well.   To learn more about what you can do with MyChart, go to [email or web address].    Your next appointment:   2 year(s)  The format for your next appointment:   In Person  Provider:   Donato Schultz, MD

## 2023-09-25 NOTE — Progress Notes (Signed)
Cardiology Office Note:  .   Date:  09/25/2023  ID:  Mariah Peterson, DOB [birth year], MRN [id number] PCP: Philip Aspen, Limmie Patricia, MD  [facility] Providers Cardiologist:  Donato Schultz, MD     History of Present Illness: .   Mariah Peterson is a 77 y.o. female Discussed with the use of AI scribe   History of Present Illness   The patient, a 77 year old with a history of coronary artery disease status post CABG in 2022, chronic kidney disease stage 3A, and diabetes, presents for a routine follow-up. The patient had a non-ST elevation myocardial infarction prior to the CABG. She reports no current chest pain, but occasionally experiences a sensation akin to 'something sticking.' This discomfort is sporadic and mild.  The patient also had discomfort at the vein harvest site in the right lower extremity post-surgery, which has since improved. She is on a regimen of aspirin 81 mg, Crestor 20 mg, Zetia 10 mg, and Losartan 100 mg daily. Her LDL was previously recorded at 56, and her hemoglobin A1c was 7.9. She is under the care of a nephrologist, with a creatinine level of 1.68.  The patient's blood pressure was noted to be slightly elevated at this visit. She has a family history of hypertension, and is currently on Clonidine and Amlodipine for blood pressure management. The patient is avoiding NSAIDs due to her kidney disease.         Studies Reviewed: .        Results LABS LDL: 49 Creatinine: 1.7 HbA1c: 7.9  DIAGNOSTIC Echocardiogram: EF 60%, normal valvular function (12/05/2020) EKG: Sinus rhythm 65, right bundle branch block  Risk Assessment/Calculations:           Physical Exam:   VS:  BP (!) 150/58   Pulse 83   Ht 4\' 11"  (1.499 m)   Wt 95 lb 6.4 oz (43.3 kg)   SpO2 100%   BMI 19.27 kg/m    Wt Readings from Last 3 Encounters:  09/25/23 95 lb 6.4 oz (43.3 kg)  08/07/23 96 lb 12.8 oz (43.9 kg)  05/02/23 100 lb 3.2 oz (45.5 kg)    GEN: Well nourished, well  developed in no acute distress NECK: No JVD; No carotid bruits CARDIAC: CABG scar RRR, no murmurs, no rubs, no gallops RESPIRATORY:  Clear to auscultation without rales, wheezing or rhonchi  ABDOMEN: Soft, non-tender, non-distended EXTREMITIES:  No edema; No deformity   ASSESSMENT AND PLAN: .    Assessment and Plan    Coronary Artery Disease (CAD) Status Post CABG Well-managed post-CABG (February 2022) following a non-ST elevation myocardial infarction. No current chest pain, occasional twinges likely due to scar tissue. Echocardiogram (2022) showed EF 60% with normal valvular function. ECG showed sinus rhythm with right bundle branch block. LDL cholesterol controlled at 49 mg/dL with current medications. - Continue aspirin 81 mg daily - Continue Crestor 20 mg daily - Continue Zetia 10 mg daily - Schedule follow-up in two years unless symptoms arise  Hypertension Blood pressure slightly elevated. On multiple antihypertensive medications. Family history of hypertension. Close monitoring and potential adjustments by nephrology discussed. - Continue losartan 100 mg daily - Continue clonidine 0.1 mg twice daily - Continue amlodipine 10 mg daily - Monitor blood pressure closely with nephrology  Chronic Kidney Disease Stage 3A CKD stage 3A with creatinine levels 1.4-1.7. Under nephrology care, avoiding NSAIDs. Blood pressure management crucial due to impact on kidney function. Coordination with nephrology for potential medication adjustments discussed. -  Continue monitoring creatinine levels - Continue losartan 100 mg daily - Continue clonidine 0.1 mg twice daily - Continue amlodipine 10 mg daily - Coordinate with nephrology for blood pressure management  Diabetes Mellitus Under endocrinology care. Hemoglobin A1c previously 7.9%. Importance of maintaining current diabetes management to prevent complications discussed. - Continue current diabetes management under endocrinology  General  Health Maintenance Maintaining regular follow-ups with nephrology and endocrinology. No new health maintenance issues discussed. - Continue regular follow-ups with nephrology and endocrinology  Follow-up - Schedule follow-up in two years unless symptoms arise - Contact office if any new cardiac symptoms develop.               Signed, Donato Schultz, MD

## 2023-10-28 ENCOUNTER — Other Ambulatory Visit: Payer: Self-pay

## 2023-10-28 ENCOUNTER — Telehealth: Payer: Self-pay

## 2023-10-28 DIAGNOSIS — E1169 Type 2 diabetes mellitus with other specified complication: Secondary | ICD-10-CM

## 2023-10-28 NOTE — Telephone Encounter (Signed)
 Patient will stop by office on Thursday to pickup Ozempic patient assistance.

## 2023-10-31 ENCOUNTER — Other Ambulatory Visit: Payer: Medicare Other

## 2023-11-01 LAB — BASIC METABOLIC PANEL
BUN/Creatinine Ratio: 19 (calc) (ref 6–22)
BUN: 29 mg/dL — ABNORMAL HIGH (ref 7–25)
CO2: 24 mmol/L (ref 20–32)
Calcium: 9.4 mg/dL (ref 8.6–10.4)
Chloride: 107 mmol/L (ref 98–110)
Creat: 1.51 mg/dL — ABNORMAL HIGH (ref 0.60–1.00)
Glucose, Bld: 198 mg/dL — ABNORMAL HIGH (ref 65–99)
Potassium: 4.9 mmol/L (ref 3.5–5.3)
Sodium: 138 mmol/L (ref 135–146)

## 2023-11-01 LAB — HEMOGLOBIN A1C
Hgb A1c MFr Bld: 8.8 %{Hb} — ABNORMAL HIGH (ref <5.7)
Mean Plasma Glucose: 206 mg/dL
eAG (mmol/L): 11.4 mmol/L

## 2023-11-07 ENCOUNTER — Other Ambulatory Visit: Payer: Self-pay

## 2023-11-07 ENCOUNTER — Encounter: Payer: Self-pay | Admitting: Endocrinology

## 2023-11-07 ENCOUNTER — Ambulatory Visit: Payer: Medicare Other | Admitting: Endocrinology

## 2023-11-07 VITALS — BP 136/70 | HR 75 | Resp 16 | Ht 59.0 in | Wt 99.0 lb

## 2023-11-07 DIAGNOSIS — Z794 Long term (current) use of insulin: Secondary | ICD-10-CM | POA: Diagnosis not present

## 2023-11-07 DIAGNOSIS — E1169 Type 2 diabetes mellitus with other specified complication: Secondary | ICD-10-CM

## 2023-11-07 MED ORDER — DEXCOM G7 SENSOR MISC: 1.0000 | 3 refills | Status: AC

## 2023-11-07 NOTE — Patient Instructions (Signed)
Diabetes regimen: Semeglee long acting 14 units daily. Novolog 10 units with breakfast and 10 units with lunch and 8 units dinner. Ozempic to 0.5 mg weekly.

## 2023-11-07 NOTE — Progress Notes (Signed)
Outpatient Endocrinology Note Mariah Hesston Hitchens, MD  11/07/23  Patient's Name: Mariah Peterson    DOB: 05/30/1946    MRN: [id number]                                                    REASON OF VISIT: Follow-up for type 2 diabetes mellitus  PCP: Philip Aspen, Limmie Patricia, MD  HISTORY OF PRESENT ILLNESS:   Mariah Peterson is a 78 y.o. old female with past medical history listed below, is here for follow-up follow up for type 2 diabetes mellitus.    Pertinent Diabetes History: Patient was diagnosed with type 2 diabetes mellitus around 1990s.  She was started on insulin therapy around 2020.  Chronic Diabetes Complications : Retinopathy: no. Last ophthalmology exam was done on annually reportedly, last exam in February 2024. Nephropathy: CKD, on losartan, following with nephrology.  Most recent diabetic foot exam February 2024. Peripheral neuropathy: yes, on gabapentin. Coronary artery disease: yes Stroke: no  Relevant comorbidities and cardiovascular risk factors: Obesity: no Body mass index is 20 kg/m.  Hypertension: yes Hyperlipidemia. Yes, on statin and zetia.   Current / Home Diabetic regimen includes: Lantus 14 units daily in the morning Novolog 10 units with BF, 10 with lunch or supper. Ozempic 0.5 mg weekly.  Actos 15mg  daily.  Prior diabetic medications: Marcelline Deist and Jardiance planned in the past, not cost effective.  Glycemic data:   Patient has been using Dexcom G7 however she has used for about 1 and half days in last 2 weeks.  She had variable blood sugar especially in the evening and bedtime, related to missing mealtime insulin for supper and also had hypoglycemia in the last night probably related to NovoLog when taking.  Not enough sugar data or monitoring for the proper interpretation of CGM.  Hypoglycemia: Patient has minor hypoglycemic episodes. Patient has hypoglycemia awareness.  Factors modifying glucose control: 1.  Diabetic diet assessment: 3 meals a day.   Bedtime snacks.  2.  Staying active or exercising: No formal exercise  3.  Medication compliance: compliant most of the time.  Interval history Diabetes regimen as reviewed and noted above.  She admits to not fully compliant with mealtime insulin.  Hemoglobin A1c recently 8.8%.  Available blood sugar from Dexcom as reviewed and noted above.  No new complaints today.  She has gained about 5 pounds of weight from last visit.  REVIEW OF SYSTEMS As per history of present illness.   PAST MEDICAL HISTORY: Past Medical History:  Diagnosis Date   Chest pain 11/30/2020   Coronary artery disease    Diabetes mellitus without complication (HCC)    Glaucoma    History of chicken pox    Hypertension     PAST SURGICAL HISTORY: Past Surgical History:  Procedure Laterality Date   BREAST BIOPSY Right 2017   benign   CARDIAC CATHETERIZATION     CATARACT EXTRACTION, BILATERAL Bilateral    CESAREAN SECTION     COLONOSCOPY  1980's   CORONARY ARTERY BYPASS GRAFT N/A 12/05/2020   Procedure: CORONARY ARTERY BYPASS GRAFTING (CABG), ON PUMP, TIMES FOUR, USING LEFT INTERNAL MAMMARY ARTERY AND BILATERAL ENDOSCOPICALLY HARVESTED GREATER SAPHENOUS VEINS;  Surgeon: Corliss Skains, MD;  Location: [facility] OR;  Service: Open Heart Surgery;  Laterality: N/A;   LEFT HEART CATH AND CORONARY ANGIOGRAPHY N/A  11/30/2020   Procedure: LEFT HEART CATH AND CORONARY ANGIOGRAPHY;  Surgeon: Lyn Records, MD;  Location: [facility] INVASIVE CV LAB;  Service: Cardiovascular;  Laterality: N/A;   TEE WITHOUT CARDIOVERSION N/A 12/05/2020   Procedure: TRANSESOPHAGEAL ECHOCARDIOGRAM (TEE);  Surgeon: Corliss Skains, MD;  Location: [facility] OR;  Service: Open Heart Surgery;  Laterality: N/A;   TUBAL LIGATION      ALLERGIES: No Known Allergies  FAMILY HISTORY:  Family History  Problem Relation Age of Onset   Hypertension Mother    Diabetes Brother    CAD Brother    Hypertension Brother    Diabetes Brother    Hypertension Brother     Colon cancer Neg Hx    Esophageal cancer Neg Hx    Pancreatic cancer Neg Hx    Liver disease Neg Hx    Stomach cancer Neg Hx    Rectal cancer Neg Hx    Colon polyps Neg Hx     SOCIAL HISTORY: Social History   Socioeconomic History   Marital status: Married    Spouse name: Not on file   Number of children: 1   Years of education: 12   Highest education level: High school graduate  Occupational History   Occupation: Retired  Tobacco Use   Smoking status: Never   Smokeless tobacco: Never  Vaping Use   Vaping status: Never Used  Substance and Sexual Activity   Alcohol use: No   Drug use: No   Sexual activity: Yes  Other Topics Concern   Not on file  Social History Narrative   Not on file   Social Drivers of Health   Financial Resource Strain: Low Risk  (06/27/2022)   Overall Financial Resource Strain (CARDIA)    Difficulty of Paying Living Expenses: Not hard at all  Food Insecurity: No Food Insecurity (02/26/2023)   Hunger Vital Sign    Worried About Running Out of Food in the Last Year: Never true    Ran Out of Food in the Last Year: Never true  Transportation Needs: No Transportation Needs (06/27/2022)   PRAPARE - Administrator, Civil Service (Medical): No    Lack of Transportation (Non-Medical): No  Physical Activity: Insufficiently Active (06/27/2022)   Exercise Vital Sign    Days of Exercise per Week: 3 days    Minutes of Exercise per Session: 30 min  Stress: No Stress Concern Present (06/27/2022)   [employer] of Occupational Health - Occupational Stress Questionnaire    Feeling of Stress : Not at all  Social Connections: Socially Integrated (06/27/2022)   Social Connection and Isolation Panel [NHANES]    Frequency of Communication with Friends and Family: More than three times a week    Frequency of Social Gatherings with Friends and Family: More than three times a week    Attends Religious Services: More than 4 times per year    Active Member of  [employer] or Organizations: Yes    Attends Engineer, structural: More than 4 times per year    Marital Status: Married    MEDICATIONS:  Current Outpatient Medications  Medication Sig Dispense Refill   amLODipine (NORVASC) 10 MG tablet Take 1 tablet (10 mg total) by mouth daily. 90 tablet 0   aspirin EC 81 MG tablet Take 1 tablet (81 mg total) by mouth daily. Swallow whole. 90 tablet 3   calcium carbonate (OSCAL) 1500 (600 Ca) MG TABS tablet Take 1 tablet by mouth 2 (two) times daily with  a meal.     cloNIDine (CATAPRES) 0.1 MG tablet Take 1 tablet (0.1 mg total) by mouth 2 (two) times daily. 60 tablet 1   Continuous Glucose Sensor (DEXCOM G7 SENSOR) MISC 1 Device by Does not apply route continuous. 9 each 3   ezetimibe (ZETIA) 10 MG tablet Take 1 tablet by mouth once daily 90 tablet 0   Ferrous Sulfate (IRON PO) Take by mouth.     gabapentin (NEURONTIN) 100 MG capsule TAKE 1 CAPSULE BY MOUTH THREE TIMES DAILY 90 capsule 0   glucose blood test strip Use as instructed to test blood sugar 3 times daily E11.65 270 each 1   insulin aspart (NOVOLOG FLEXPEN) 100 UNIT/ML FlexPen 8 UNITS BEFORE Breakfast on exercise days and if eating eggs 10 Units on other days LUNCH COVERAGE: NOVOLOG 8 UNITS if eating at home and 10 units for fast food Dinner 8-10 units Novolog before eating 15 mL 3   insulin glargine-yfgn (SEMGLEE, YFGN,) 100 UNIT/ML Pen INJECT 16 UNITS SUBCUTANEOUSLY ONCE DAILY 15 mL 2   losartan (COZAAR) 100 MG tablet Take 1 tablet by mouth once daily 90 tablet 1   metoprolol tartrate (LOPRESSOR) 25 MG tablet Take 1 tablet by mouth twice daily 180 tablet 1   pioglitazone (ACTOS) 15 MG tablet Take 1 tablet by mouth once daily 30 tablet 5   rosuvastatin (CRESTOR) 20 MG tablet Take 1 tablet (20 mg total) by mouth daily. 90 tablet 1   Semaglutide,0.25 or 0.5MG /DOS, 2 MG/3ML SOPN Inject 0.5 mg into the skin once a week. 9 mL 3   Continuous Blood Gluc Sensor (FREESTYLE [place] 2 SENSOR) MISC CHANGE  EVERY 14 DAYS (Patient not taking: Reported on 11/07/2023) 2 each 0   Current Facility-Administered Medications  Medication Dose Route Frequency Provider Last Rate Last Admin   diclofenac Sodium (VOLTAREN) 1 % topical gel 2 g  2 g Topical TID Doree Fudge M, PA-C        PHYSICAL EXAM: Vitals:   11/07/23 0849  BP: 136/70  Pulse: 75  Resp: 16  SpO2: 99%  Weight: 99 lb (44.9 kg)  Height: 4\' 11"  (1.499 m)    Body mass index is 20 kg/m.  Wt Readings from Last 3 Encounters:  11/07/23 99 lb (44.9 kg)  09/25/23 95 lb 6.4 oz (43.3 kg)  08/07/23 96 lb 12.8 oz (43.9 kg)    General: Well developed, well nourished female in no apparent distress.  HEENT: AT/[place], no external lesions.  Eyes: Conjunctiva clear and no icterus. Neck: Neck supple  Lungs: Respirations not labored Neurologic: Alert, oriented, normal speech Extremities / Skin: Dry.  Psychiatric: Does not appear depressed or anxious  Diabetic Foot Exam - Simple   No data filed    LABS Reviewed Lab Results  Component Value Date   HGBA1C 8.8 (H) 10/31/2023   HGBA1C 8.8 (H) 07/29/2023   HGBA1C 8.0 (H) 04/29/2023   Lab Results  Component Value Date   FRUCTOSAMINE 353 (H) 07/29/2023   FRUCTOSAMINE 346 (H) 05/04/2022   FRUCTOSAMINE 337 (H) 01/23/2022   Lab Results  Component Value Date   CHOL 113 04/29/2023   HDL 48.20 04/29/2023   LDLCALC 49 04/29/2023   LDLDIRECT 33.0 01/23/2022   TRIG 81.0 04/29/2023   CHOLHDL 2 04/29/2023   Lab Results  Component Value Date   MICRALBCREAT 119.8 (H) 04/29/2023   MICRALBCREAT 51.5 (H) 11/28/2021   Lab Results  Component Value Date   CREATININE 1.51 (H) 10/31/2023   Lab Results  Component Value Date   GFR 36.27 (L) 07/29/2023    ASSESSMENT / PLAN  1. Type 2 diabetes mellitus with other specified complication, with long-term current use of insulin (HCC)     Diabetes Mellitus type 2, complicated by diabetic neuropathy and nephropathy. - Diabetic status /  severity: Uncontrolled.  Lab Results  Component Value Date   HGBA1C 8.8 (H) 10/31/2023    - Hemoglobin A1c goal : <7.5%  Discussed about portion control and limiting carbohydrate in the meals.  Discussed about avoiding frequently snack and bedtime snack.  Discussed about compliance with NovoLog with all the meals.  - Medications: See below.  I) continue Semglee 14 units daily. II) continue NovoLog 10 units with breakfast and NovoLog 10 units with lunch and at dinner decrease from 10 to 8 units. III) continue Ozempic to 0.5 mg weekly. IV) continue Actos 15 mg daily.  - Home glucose testing: CGM Dexcom and check as needed. - Discussed/ Gave Hypoglycemia treatment plan.  # Consult : not required at this time.   # Annual urine for microalbuminuria/ creatinine ratio, no microalbuminuria currently, continue ACE/ARB/losartan.  She has CKD following with nephrology. Last  Lab Results  Component Value Date   MICRALBCREAT 119.8 (H) 04/29/2023    # Foot check nightly / neuropathy, continue gabapentin.  # Annual dilated diabetic eye exams.   - Diet: Make healthy diabetic food choices - Life style / activity / exercise: Discussed.  2. Blood pressure  -  BP Readings from Last 1 Encounters:  11/07/23 136/70    - Control is in target.  - No change in current plans.    3. Lipid status / Hyperlipidemia - Last  Lab Results  Component Value Date   LDLCALC 49 04/29/2023   - Continue rosuvastatin 20 mg daily.  Zetia 10 mg daily.  Diagnoses and all orders for this visit:  Type 2 diabetes mellitus with other specified complication, with long-term current use of insulin (HCC) -     BASIC METABOLIC PANEL WITH GFR -     Hemoglobin A1c  Other orders -     Continuous Glucose Sensor (DEXCOM G7 SENSOR) MISC; 1 Device by Does not apply route continuous.     DISPOSITION Follow up in clinic in 3 months suggested.   All questions answered and patient verbalized understanding of the  plan.  Mariah Estuardo Frisbee, MD [facility] End[facility] [address] [place], [address] [place], [place] [address] Phone # [phone]  At least part of this note was generated using voice recognition software. Inadvertent word errors may have occurred, which were not recognized during the proofreading process.

## 2023-11-07 NOTE — Progress Notes (Signed)
Received patient's patient assistance meds. Received Ozempic x 5.  Patient given during appointment .

## 2023-12-03 ENCOUNTER — Other Ambulatory Visit (INDEPENDENT_AMBULATORY_CARE_PROVIDER_SITE_OTHER): Payer: Medicare Other

## 2023-12-03 DIAGNOSIS — Z794 Long term (current) use of insulin: Secondary | ICD-10-CM

## 2023-12-03 DIAGNOSIS — E1122 Type 2 diabetes mellitus with diabetic chronic kidney disease: Secondary | ICD-10-CM

## 2023-12-03 DIAGNOSIS — N1832 Chronic kidney disease, stage 3b: Secondary | ICD-10-CM

## 2023-12-03 NOTE — Progress Notes (Signed)
12/03/2023 Name: Mariah Peterson MRN: [id number] DOB: 06-15-1946  Chief Complaint  Patient presents with   Diabetes   Medication Management    Mariah Peterson is a 78 y.o. year old female who presented for a telephone visit.   They were referred to the pharmacist  for assistance in managing diabetes.    Subjective:  Care Team: Primary Care Provider: Philip Aspen, Limmie Patricia, MD ; Next Scheduled Visit: not scheduled  Medication Access/Adherence  Current Pharmacy:  [facility] [address] [place], [place] - [address] [place] [address] Phone: [phone] Fax: [phone]  [facility] - [place], [place] - [address] [place] [address] Phone: [phone] Fax: [phone]   Patient reports affordability concerns with their medications: No  Patient reports access/transportation concerns to their pharmacy: No  Patient reports adherence concerns with their medications:  No  - however, fill dates show late to fill on several medications, particularly for BP meds   Diabetes:  Current medications: Ozempic 0.5mg  once weekly, Semglee 14 units once daily, Novolog 10 units TID before meals, Pioglitazone active on list (per patient she stopped taking, says someone told her to but she is not sure who - per most recent endo note, was to be continued) Medications tried in the past: Jardiance/Farxiga (cost), Metformin  Current glucose readings: Using Dexcom G7 sensors, states she has one on now, uses reader not the app. Not able to report sugar readings   Patient denies hypoglycemic s/sx including dizziness, shakiness, sweating. Patient denies hyperglycemic symptoms including polyuria, polydipsia, polyphagia, nocturia, neuropathy, blurred vision.    Current medication access support: None   Objective:  Lab Results  Component Value Date   HGBA1C 8.8 (H) 10/31/2023    Lab Results   Component Value Date   CREATININE 1.51 (H) 10/31/2023   BUN 29 (H) 10/31/2023   NA 138 10/31/2023   K 4.9 10/31/2023   CL 107 10/31/2023   CO2 24 10/31/2023    Lab Results  Component Value Date   CHOL 113 04/29/2023   HDL 48.20 04/29/2023   LDLCALC 49 04/29/2023   LDLDIRECT 33.0 01/23/2022   TRIG 81.0 04/29/2023   CHOLHDL 2 04/29/2023    Medications Reviewed Today     Reviewed by Sherrill Raring, RPH (Pharmacist) on 12/03/23 at 1044  Med List Status: <None>   Medication Order Taking? Sig Documenting Provider Last Dose Status Informant  amLODipine (NORVASC) 10 MG tablet [id number] Yes Take 1 tablet (10 mg total) by mouth daily. Reather Littler, MD Taking Active   aspirin EC 81 MG tablet [id number] Yes Take 1 tablet (81 mg total) by mouth daily. Swallow whole. Jake Bathe, MD Taking Active   calcium carbonate (OSCAL) 1500 (600 Ca) MG TABS tablet [id number] Yes Take 1 tablet by mouth 2 (two) times daily with a meal. [provider] Taking Active Self  cloNIDine (CATAPRES) 0.1 MG tablet [id number] Yes Take 1 tablet (0.1 mg total) by mouth 2 (two) times daily. Reather Littler, MD Taking Active   Continuous Blood Gluc Sensor (FREESTYLE [place] 2 SENSOR) [place] [id number]  CHANGE EVERY 14 DAYS  Patient not taking: Reported on 11/07/2023   Reather Littler, MD  Active            Med Note Henreitta Leber, Arlie Solomons Sep 25, 2023  9:25 AM)    Continuous Glucose Sensor (DEXCOM G7 [place]) [place] [id number]  1 Device  by Does not apply route continuous. [place], [place], MD  Active   ezetimibe (ZETIA) 10 MG tablet [id number] Yes Take 1 tablet by mouth once daily Reather Littler, MD Taking Active   Ferrous Sulfate (IRON PO) [id number] Yes Take by mouth. [provider] Taking Active   glucose blood test strip [id number]  Use as instructed to test blood sugar 3 times daily E11.65 Reather Littler, MD  Active Self           Med Note Henreitta Leber, JACQUELINE L   Wed Sep 25, 2023  9:25 AM)    insulin aspart (NOVOLOG  FLEXPEN) 100 UNIT/ML FlexPen [id number] Yes 8 UNITS BEFORE Breakfast on exercise days and if eating eggs 10 Units on other days LUNCH COVERAGE: NOVOLOG 8 UNITS if eating at home and 10 units for fast food Dinner 8-10 units Novolog before eating [place], [place], MD Taking Active            Med Note Leitha Bleak, Noelani Harbach H   Tue Dec 03, 2023 10:27 AM) 10 units before each meal  insulin glargine-yfgn (SEMGLEE, YFGN,) 100 UNIT/ML Pen [id number] Yes INJECT 16 UNITS SUBCUTANEOUSLY ONCE DAILY Reather Littler, MD Taking Active            Med Note Leitha Bleak, Marvelle Caudill H   Tue Dec 03, 2023 10:25 AM) 14 units semglee  losartan (COZAAR) 100 MG tablet [id number] Yes Take 1 tablet by mouth once daily Philip Aspen, Limmie Patricia, MD Taking Active   metoprolol tartrate (LOPRESSOR) 25 MG tablet [id number] Yes Take 1 tablet by mouth twice daily Philip Aspen, Limmie Patricia, MD Taking Active            Med Note Littie Deeds, CHERYL A   Tue Jan 29, 2023  3:25 PM) Taking once daily  pioglitazone (ACTOS) 15 MG tablet [id number] No Take 1 tablet by mouth once daily  Patient not taking: Reported on 12/03/2023   Reather Littler, MD Not Taking Active   rosuvastatin (CRESTOR) 20 MG tablet [id number] Yes Take 1 tablet (20 mg total) by mouth daily. Reather Littler, MD Taking Active   Semaglutide,0.25 or 0.5MG /DOS, 2 MG/3ML Namon Cirri [id number] Yes Inject 0.5 mg into the skin once a week. [place], [place], MD Taking Active               Assessment/Plan:   Diabetes: - Currently uncontrolled - Reviewed long term cardiovascular and renal outcomes of uncontrolled blood sugar - Reviewed goal A1c, goal fasting, and goal 2 hour post prandial glucose - Reviewed dietary modifications including low carb diet - Recommend to come in to office to setup G7 dexcom app and to further review medications and sugars in detail  - Patient denies personal or family history of multiple endocrine neoplasia type 2, medullary thyroid cancer; personal history of pancreatitis or gallbladder  disease. - Recommend to check glucose continuously with sensor -Recommend scheduling past due f/u with PCP, patient states she will next week when she is in   Follow Up Plan: 1 week  Sherrill Raring, PharmD Clinical Pharmacist [phone]

## 2023-12-10 ENCOUNTER — Telehealth: Payer: Self-pay | Admitting: Internal Medicine

## 2023-12-10 NOTE — Telephone Encounter (Signed)
Attempted to return patients call, no answer.

## 2023-12-10 NOTE — Telephone Encounter (Signed)
Copied from CRM [id number]. Topic: Appointments - Scheduling Inquiry for Clinic >> Dec 10, 2023 11:17 AM Florestine Avers wrote: Reason for CRM: Patient requesting a call back from "Jewell".

## 2023-12-11 ENCOUNTER — Ambulatory Visit: Payer: Medicare Other

## 2023-12-17 ENCOUNTER — Ambulatory Visit: Payer: Medicare Other | Admitting: Internal Medicine

## 2023-12-23 ENCOUNTER — Encounter: Payer: Self-pay | Admitting: Internal Medicine

## 2023-12-23 ENCOUNTER — Ambulatory Visit (INDEPENDENT_AMBULATORY_CARE_PROVIDER_SITE_OTHER): Payer: Medicare Other | Admitting: Internal Medicine

## 2023-12-23 VITALS — BP 197/90 | HR 94 | Temp 98.2°F | Wt 98.8 lb

## 2023-12-23 DIAGNOSIS — Z794 Long term (current) use of insulin: Secondary | ICD-10-CM

## 2023-12-23 DIAGNOSIS — E1122 Type 2 diabetes mellitus with diabetic chronic kidney disease: Secondary | ICD-10-CM

## 2023-12-23 DIAGNOSIS — I10 Essential (primary) hypertension: Secondary | ICD-10-CM | POA: Diagnosis not present

## 2023-12-23 DIAGNOSIS — I214 Non-ST elevation (NSTEMI) myocardial infarction: Secondary | ICD-10-CM

## 2023-12-23 DIAGNOSIS — E782 Mixed hyperlipidemia: Secondary | ICD-10-CM

## 2023-12-23 DIAGNOSIS — N1832 Chronic kidney disease, stage 3b: Secondary | ICD-10-CM

## 2023-12-23 NOTE — Progress Notes (Signed)
 Established Patient Office Visit     CC/Reason for Visit: Follow-up chronic conditions  HPI: Mariah Peterson is a 78 y.o. female who is coming in today for the above mentioned reasons. Past Medical History is significant for:  Hypertension, hyperlipidemia, insulin-dependent diabetes followed by endocrinology.  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  her cardiologist is Dr. Anne Fu.  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 Malen Gauze.  She did not take her morning doses of blood pressure medication as she was having blood work today and thought she needed to be fully fasting.  She has been eating a lot of carb heavy foods including peanut butter and jelly sandwiches almost daily for lunch.  Past Medical/Surgical History: Past Medical History:  Diagnosis Date   Chest pain 11/30/2020   Coronary artery disease    Diabetes mellitus without complication (HCC)    Glaucoma    History of chicken pox    Hypertension     Past Surgical History:  Procedure Laterality Date   BREAST BIOPSY Right 2017   benign   CARDIAC CATHETERIZATION     CATARACT EXTRACTION, BILATERAL Bilateral    CESAREAN SECTION     COLONOSCOPY  1980's   CORONARY ARTERY BYPASS GRAFT N/A 12/05/2020   Procedure: CORONARY ARTERY BYPASS GRAFTING (CABG), ON PUMP, TIMES FOUR, USING LEFT INTERNAL MAMMARY ARTERY AND BILATERAL ENDOSCOPICALLY HARVESTED GREATER SAPHENOUS VEINS;  Surgeon: Corliss Skains, MD;  Location: [facility] OR;  Service: Open Heart Surgery;  Laterality: N/A;   LEFT HEART CATH AND CORONARY ANGIOGRAPHY N/A 11/30/2020   Procedure: LEFT HEART CATH AND CORONARY ANGIOGRAPHY;  Surgeon: Lyn Records, MD;  Location: [facility] INVAS[facility];  Service: Cardiovascular;  Laterality: N/A;   TEE WITHOUT CARDIOVERSION N/A 12/05/2020   Procedure: TRANSESOPHAGEAL ECHOCARDIOGRAM (TEE);  Surgeon:  Corliss Skains, MD;  Location: [facility] OR;  Service: Open Heart Surgery;  Laterality: N/A;   TUBAL LIGATION      Social History:  reports that she has never smoked. She has never used smokeless tobacco. She reports that she does not drink alcohol and does not use drugs.  Allergies: No Known Allergies  Family History:  Family History  Problem Relation Age of Onset   Hypertension Mother    Diabetes Brother    CAD Brother    Hypertension Brother    Diabetes Brother    Hypertension Brother    Colon cancer Neg Hx    Esophageal cancer Neg Hx    Pancreatic cancer Neg Hx    Liver disease Neg Hx    Stomach cancer Neg Hx    Rectal cancer Neg Hx    Colon polyps Neg Hx      Current Outpatient Medications:    amLODipine (NORVASC) 10 MG tablet, Take 1 tablet (10 mg total) by mouth daily., Disp: 90 tablet, Rfl: 0   aspirin EC 81 MG tablet, Take 1 tablet (81 mg total) by mouth daily. Swallow whole., Disp: 90 tablet, Rfl: 3   calcium carbonate (OSCAL) 1500 (600 Ca) MG TABS tablet, Take 1 tablet by mouth 2 (two) times daily with a meal., Disp: , Rfl:    cloNIDine (CATAPRES) 0.1 MG tablet, Take 1 tablet (0.1 mg total) by mouth 2 (two) times daily., Disp: 60 tablet, Rfl: 1   Continuous Glucose Sensor (DEXCOM G7 SENSOR) MISC, 1 Device by  Does not apply route continuous., Disp: 9 each, Rfl: 3   ezetimibe (ZETIA) 10 MG tablet, Take 1 tablet by mouth once daily, Disp: 90 tablet, Rfl: 0   Ferrous Sulfate (IRON PO), Take by mouth., Disp: , Rfl:    glucose blood test strip, Use as instructed to test blood sugar 3 times daily E11.65, Disp: 270 each, Rfl: 1   insulin aspart (NOVOLOG FLEXPEN) 100 UNIT/ML FlexPen, 8 UNITS BEFORE Breakfast on exercise days and if eating eggs 10 Units on other days LUNCH COVERAGE: NOVOLOG 8 UNITS if eating at home and 10 units for fast food Dinner 8-10 units Novolog before eating, Disp: 15 mL, Rfl: 3   insulin glargine-yfgn (SEMGLEE, YFGN,) 100 UNIT/ML Pen, INJECT 16 UNITS  SUBCUTANEOUSLY ONCE DAILY, Disp: 15 mL, Rfl: 2   losartan (COZAAR) 100 MG tablet, Take 1 tablet by mouth once daily, Disp: 90 tablet, Rfl: 1   metoprolol tartrate (LOPRESSOR) 25 MG tablet, Take 1 tablet by mouth twice daily, Disp: 180 tablet, Rfl: 1   pioglitazone (ACTOS) 15 MG tablet, Take 1 tablet by mouth once daily, Disp: 30 tablet, Rfl: 5   rosuvastatin (CRESTOR) 20 MG tablet, Take 1 tablet (20 mg total) by mouth daily., Disp: 90 tablet, Rfl: 1   Semaglutide,0.25 or 0.5MG /DOS, 2 MG/3ML SOPN, Inject 0.5 mg into the skin once a week., Disp: 9 mL, Rfl: 3  Review of Systems:  Negative unless indicated in HPI.   Physical Exam: Vitals:   12/23/23 1258 12/23/23 1302  BP: (!) 188/80 (!) 197/90  Pulse: 94   Temp: 98.2 F (36.8 C)   TempSrc: Oral   SpO2: 99%   Weight: 98 lb 12.8 oz (44.8 kg)     Body mass index is 19.96 kg/m.   Physical Exam Vitals reviewed.  Constitutional:      Appearance: Normal appearance.  HENT:     Head: Normocephalic and atraumatic.  Eyes:     Conjunctiva/sclera: Conjunctivae normal.  Cardiovascular:     Rate and Rhythm: Normal rate and regular rhythm.  Pulmonary:     Effort: Pulmonary effort is normal.     Breath sounds: Normal breath sounds.  Skin:    General: Skin is warm and dry.  Neurological:     General: No focal deficit present.     Mental Status: She is alert and oriented to person, place, and time.  Psychiatric:        Mood and Affect: Mood normal.        Behavior: Behavior normal.        Thought Content: Thought content normal.        Judgment: Judgment normal.      Impression and Plan:  Mixed hyperlipidemia  Primary hypertension  NSTEMI (non-ST elevated myocardial infarction) (HCC)  Type 2 diabetes mellitus with stage 3b chronic kidney disease, with long-term current use of insulin (HCC)   -Blood pressure is not well-controlled, however I wonder how much of it is rebound hypertension from missing her morning clonidine  dose.  She will do ambulatory blood pressure monitoring and contact me in 2 weeks with results. -We discussed about dietary changes to help improve her A1c.  She continues to work with endocrinology on this. -She has had no chest pain or shortness of breath. -LDL is at goal at 104 as of July 2024.  Time spent:30 minutes reviewing chart, interviewing and examining patient and formulating plan of care.     Chaya Jan, MD  Primary Care at [facility]

## 2024-01-01 LAB — BASIC METABOLIC PANEL
BUN: 33 — AB (ref 4–21)
CO2: 21 (ref 13–22)
Chloride: 108 (ref 99–108)
Creatinine: 2.3 — AB (ref 0.5–1.1)
Glucose: 305
Potassium: 5 meq/L (ref 3.5–5.1)
Sodium: 136 — AB (ref 137–147)

## 2024-01-01 LAB — COMPREHENSIVE METABOLIC PANEL
Albumin: 3.5 (ref 3.5–5.0)
Calcium: 8.9 (ref 8.7–10.7)
eGFR: 22

## 2024-01-01 LAB — LAB REPORT - SCANNED: EGFR: 22

## 2024-01-07 ENCOUNTER — Encounter: Payer: Self-pay | Admitting: Nephrology

## 2024-01-09 ENCOUNTER — Encounter: Payer: Self-pay | Admitting: Internal Medicine

## 2024-01-23 ENCOUNTER — Other Ambulatory Visit: Payer: Self-pay | Admitting: Internal Medicine

## 2024-01-23 DIAGNOSIS — I10 Essential (primary) hypertension: Secondary | ICD-10-CM

## 2024-01-27 ENCOUNTER — Other Ambulatory Visit: Payer: Self-pay

## 2024-01-29 ENCOUNTER — Other Ambulatory Visit: Payer: Self-pay

## 2024-01-29 MED ORDER — EZETIMIBE 10 MG PO TABS: 10.0000 mg | ORAL_TABLET | Freq: Every day | ORAL | 3 refills | Status: DC

## 2024-02-04 ENCOUNTER — Other Ambulatory Visit: Payer: Self-pay

## 2024-02-04 DIAGNOSIS — E7800 Pure hypercholesterolemia, unspecified: Secondary | ICD-10-CM

## 2024-02-04 MED ORDER — EZETIMIBE 10 MG PO TABS: 10.0000 mg | ORAL_TABLET | Freq: Every day | ORAL | 3 refills | Status: AC

## 2024-02-06 ENCOUNTER — Other Ambulatory Visit: Payer: Medicare Other

## 2024-02-11 ENCOUNTER — Encounter: Payer: Self-pay | Admitting: Endocrinology

## 2024-02-11 ENCOUNTER — Ambulatory Visit (INDEPENDENT_AMBULATORY_CARE_PROVIDER_SITE_OTHER): Payer: Medicare Other | Admitting: Endocrinology

## 2024-02-11 VITALS — BP 138/70 | HR 89 | Resp 20 | Ht 59.0 in | Wt 97.8 lb

## 2024-02-11 DIAGNOSIS — E1169 Type 2 diabetes mellitus with other specified complication: Secondary | ICD-10-CM

## 2024-02-11 DIAGNOSIS — E1165 Type 2 diabetes mellitus with hyperglycemia: Secondary | ICD-10-CM | POA: Diagnosis not present

## 2024-02-11 DIAGNOSIS — Z794 Long term (current) use of insulin: Secondary | ICD-10-CM

## 2024-02-11 LAB — POCT GLYCOSYLATED HEMOGLOBIN (HGB A1C): Hemoglobin A1C: 9.3 % — AB (ref 4.0–5.6)

## 2024-02-11 NOTE — Progress Notes (Addendum)
 Outpatient Endocrinology Note Mariah Lasheika Ortloff, MD  02/11/24  Patient's Name: Mariah Peterson    DOB: October 02, 1946    MRN: [id number]                                                    REASON OF VISIT: Follow-up for type 2 diabetes mellitus  PCP: Zilphia Hilt, Charyl Coppersmith, MD  HISTORY OF PRESENT ILLNESS:   Mariah Peterson is a 78 y.o. old female with past medical history listed below, is here for follow-up follow up for type 2 diabetes mellitus.    Pertinent Diabetes History: Patient was diagnosed with type 2 diabetes mellitus around 1990s.  She was started on insulin  therapy around 2020.  Chronic Diabetes Complications : Retinopathy: no. Last ophthalmology exam was done on annually reportedly, last exam in February 2024. Nephropathy: CKD, on losartan , following with nephrology.  Most recent diabetic foot exam February 2024. Peripheral neuropathy: yes, on gabapentin . Coronary artery disease: yes Stroke: no  Relevant comorbidities and cardiovascular risk factors: Obesity: no Body mass index is 19.75 kg/m.  Hypertension: yes Hyperlipidemia. Yes, on statin and zetia .   Current / Home Diabetic regimen includes: Lantus  / Semeglee 16 units daily in the morning Novolog  10 units with BF, 10 with lunch or supper. Ozempic  0.5 mg weekly.  Actos  15 mg daily.  Prior diabetic medications: Farxiga and Jardiance  planned in the past, not cost effective.  Glycemic data:   CONTINUOUS GLUCOSE MONITORING SYSTEM (CGMS) INTERPRETATION:           Dexcom G7 CGM-  Sensor Download (Sensor download was reviewed and summarized below.) Dates: April 9 to  February 11, 2024  Glucose Management Indicator: 8.5% Sensor Average: 215 SD 61 Sensor usage:91 %    Impression: - Mostly postprandial hyperglycemia with blood sugar up to 250 range with supper, sometime with breakfast and lunch.  Blood sugar mostly high overnight.  Occasionally acceptable blood sugar in the early morning.  No  hypoglycemia.  Hypoglycemia: Patient has no hypoglycemic episodes. Patient has hypoglycemia awareness.  Factors modifying glucose control: 1.  Diabetic diet assessment: 3 meals a day.  Bedtime snacks.  2.  Staying active or exercising: No formal exercise  3.  Medication compliance: compliant most of the time.  Interval history Diabetes regimen as reviewed above.  She reports compliance with taking insulins and taking before eating.  She still has postprandial hyperglycemia.  Hemoglobin A1c worsened to 9.3%.  CGM data as reviewed above.  She has no other complaints today.  Patient has been on Farxiga, patient reports was started by nephrology for CKD, 2 weeks ago.  REVIEW OF SYSTEMS As per history of present illness.   PAST MEDICAL HISTORY: Past Medical History:  Diagnosis Date   Chest pain 11/30/2020   Coronary artery disease    Diabetes mellitus without complication (HCC)    Glaucoma    History of chicken pox    Hypertension     PAST SURGICAL HISTORY: Past Surgical History:  Procedure Laterality Date   BREAST BIOPSY Right 2017   benign   CARDIAC CATHETERIZATION     CATARACT EXTRACTION, BILATERAL Bilateral    CESAREAN SECTION     COLONOSCOPY  1980's   CORONARY ARTERY BYPASS GRAFT N/A 12/05/2020   Procedure: CORONARY ARTERY BYPASS GRAFTING (CABG), ON PUMP, TIMES FOUR, USING LEFT INTERNAL MAMMARY  ARTERY AND BILATERAL ENDOSCOPICALLY HARVESTED GREATER SAPHENOUS VEINS;  Surgeon: Hilarie Lovely, MD;  Location: [facility] OR;  Service: Open Heart Surgery;  Laterality: N/A;   LEFT HEART CATH AND CORONARY ANGIOGRAPHY N/A 11/30/2020   Procedure: LEFT HEART CATH AND CORONARY ANGIOGRAPHY;  Surgeon: Arty Binning, MD;  Location: [facility] INV[facility];  Service: Cardiovascular;  Laterality: N/A;   TEE WITHOUT CARDIOVERSION N/A 12/05/2020   Procedure: TRANSESOPHAGEAL ECHOCARDIOGRAM (TEE);  Surgeon: Hilarie Lovely, MD;  Location: [facility] OR;  Service: Open Heart Surgery;  Laterality: N/A;    TUBAL LIGATION      ALLERGIES: No Known Allergies  FAMILY HISTORY:  Family History  Problem Relation Age of Onset   Hypertension Mother    Diabetes Brother    CAD Brother    Hypertension Brother    Diabetes Brother    Hypertension Brother    Colon cancer Neg Hx    Esophageal cancer Neg Hx    Pancreatic cancer Neg Hx    Liver disease Neg Hx    Stomach cancer Neg Hx    Rectal cancer Neg Hx    Colon polyps Neg Hx     SOCIAL HISTORY: Social History   Socioeconomic History   Marital status: Married    Spouse name: Not on file   Number of children: 1   Years of education: 12   Highest education level: High school graduate  Occupational History   Occupation: Retired  Tobacco Use   Smoking status: Never   Smokeless tobacco: Never  Vaping Use   Vaping status: Never Used  Substance and Sexual Activity   Alcohol use: No   Drug use: No   Sexual activity: Yes  Other Topics Concern   Not on file  Social History Narrative   Not on file   Social Drivers of Health   Financial Resource Strain: Low Risk  (06/27/2022)   Overall Financial Resource Strain (CARDIA)    Difficulty of Paying Living Expenses: Not hard at all  Food Insecurity: No Food Insecurity (02/26/2023)   Hunger Vital Sign    Worried About Running Out of Food in the Last Year: Never true    Ran Out of Food in the Last Year: Never true  Transportation Needs: No Transportation Needs (06/27/2022)   PRAPARE - Administrator, Civil Service (Medical): No    Lack of Transportation (Non-Medical): No  Physical Activity: Insufficiently Active (06/27/2022)   Exercise Vital Sign    Days of Exercise per Week: 3 days    Minutes of Exercise per Session: 30 min  Stress: No Stress Concern Present (06/27/2022)   [employer] of Occupational Health - Occupational Stress Questionnaire    Feeling of Stress : Not at all  Social Connections: Socially Integrated (06/27/2022)   Social Connection and Isolation Panel [NHANES]     Frequency of Communication with Friends and Family: More than three times a week    Frequency of Social Gatherings with Friends and Family: More than three times a week    Attends Religious Services: More than 4 times per year    Active Member of [employer] or Organizations: Yes    Attends Engineer, structural: More than 4 times per year    Marital Status: Married    MEDICATIONS:  Current Outpatient Medications  Medication Sig Dispense Refill   dapagliflozin propanediol (FARXIGA) 10 MG TABS tablet Take by mouth daily.     amLODipine  (NORVASC ) 10 MG tablet Take 1 tablet (10  mg total) by mouth daily. (Patient taking differently: Take 5 mg by mouth daily.) 90 tablet 0   aspirin  EC 81 MG tablet Take 1 tablet (81 mg total) by mouth daily. Swallow whole. 90 tablet 3   calcium  carbonate (OSCAL) 1500 (600 Ca) MG TABS tablet Take 1 tablet by mouth 2 (two) times daily with a meal.     cloNIDine  (CATAPRES ) 0.1 MG tablet Take 1 tablet (0.1 mg total) by mouth 2 (two) times daily. 60 tablet 1   Continuous Glucose Sensor (DEXCOM G7 SENSOR) MISC 1 Device by Does not apply route continuous. 9 each 3   ezetimibe  (ZETIA ) 10 MG tablet Take 1 tablet (10 mg total) by mouth daily. Take 1 tablet by mouth once daily 90 tablet 3   Ferrous Sulfate (IRON PO) Take by mouth.     glucose blood test strip Use as instructed to test blood sugar 3 times daily E11.65 270 each 1   insulin  aspart (NOVOLOG  FLEXPEN) 100 UNIT/ML FlexPen 8 UNITS BEFORE Breakfast on exercise days and if eating eggs 10 Units on other days LUNCH COVERAGE: NOVOLOG  8 UNITS if eating at home and 10 units for fast food Dinner 8-10 units Novolog  before eating 15 mL 3   insulin  glargine-yfgn (SEMGLEE , YFGN,) 100 UNIT/ML Pen INJECT 16 UNITS SUBCUTANEOUSLY ONCE DAILY 15 mL 2   losartan  (COZAAR ) 100 MG tablet Take 1 tablet by mouth once daily 90 tablet 1   metoprolol  tartrate (LOPRESSOR ) 25 MG tablet Take 1 tablet by mouth twice daily 180 tablet 0    pioglitazone  (ACTOS ) 15 MG tablet Take 1 tablet by mouth once daily 30 tablet 5   rosuvastatin  (CRESTOR ) 20 MG tablet Take 1 tablet (20 mg total) by mouth daily. 90 tablet 1   Semaglutide ,0.25 or 0.5MG /DOS, 2 MG/3ML SOPN Inject 0.5 mg into the skin once a week. 9 mL 3   No current facility-administered medications for this visit.    PHYSICAL EXAM: Vitals:   02/11/24 0928 02/11/24 0930  BP: (!) 150/60 138/70  Pulse: 89   Resp: 20   SpO2: 99%   Weight: 97 lb 12.8 oz (44.4 kg)   Height: 4\' 11"  (1.499 m)      Body mass index is 19.75 kg/m.  Wt Readings from Last 3 Encounters:  02/11/24 97 lb 12.8 oz (44.4 kg)  12/23/23 98 lb 12.8 oz (44.8 kg)  11/07/23 99 lb (44.9 kg)    General: Well developed, well nourished female in no apparent distress.  HEENT: AT/Purdy, no external lesions.  Eyes: Conjunctiva clear and no icterus. Neck: Neck supple  Lungs: Respirations not labored Neurologic: Alert, oriented, normal speech Extremities / Skin: Dry.  Psychiatric: Does not appear depressed or anxious  Diabetic Foot Exam - Simple   No data filed    LABS Reviewed Lab Results  Component Value Date   HGBA1C 9.3 (A) 02/11/2024   HGBA1C 8.8 (H) 10/31/2023   HGBA1C 8.8 (H) 07/29/2023   Lab Results  Component Value Date   FRUCTOSAMINE 353 (H) 07/29/2023   FRUCTOSAMINE 346 (H) 05/04/2022   FRUCTOSAMINE 337 (H) 01/23/2022   Lab Results  Component Value Date   CHOL 113 04/29/2023   HDL 48.20 04/29/2023   LDLCALC 49 04/29/2023   LDLDIRECT 33.0 01/23/2022   TRIG 81.0 04/29/2023   CHOLHDL 2 04/29/2023   Lab Results  Component Value Date   MICRALBCREAT 119.8 (H) 04/29/2023   MICRALBCREAT 51.5 (H) 11/28/2021   Lab Results  Component Value Date   CREATININE  2.3 (A) 01/01/2024   Lab Results  Component Value Date   GFR 36.27 (L) 07/29/2023    ASSESSMENT / PLAN  1. Type 2 diabetes mellitus with other specified complication, with long-term current use of insulin  (HCC)   2.  Uncontrolled type 2 diabetes mellitus with hyperglycemia (HCC)      Diabetes Mellitus type 2, complicated by diabetic neuropathy and nephropathy. - Diabetic status / severity: Uncontrolled.  Lab Results  Component Value Date   HGBA1C 9.3 (A) 02/11/2024    - Hemoglobin A1c goal : <7.5%  Discussed about portion control and limiting carbohydrate in the meals.  Discussed about avoiding frequently snack and bedtime snack.  Discussed about taking insulin  before eating.  Discussed about compliance with NovoLog  with all the meals.  - Medications: See below.  Increased Semeglee long acting from 16 to 18 units daily. Increase NovoLog  from 10 to 12 units, Novolog  12 units with breakfast and 12 units with lunch and 12 units dinner. Ozempic  0.5 mg weekly. Farxiga 10 mg daily.  Actos  15 mg daily.   - Home glucose testing: CGM Dexcom and check as needed. - Discussed/ Gave Hypoglycemia treatment plan.  # Consult : not required at this time.   # Annual urine for microalbuminuria/ creatinine ratio, no microalbuminuria currently, continue ACE/ARB/losartan .  She has CKD following with nephrology. Last  Lab Results  Component Value Date   MICRALBCREAT 119.8 (H) 04/29/2023    # Foot check nightly / neuropathy, continue gabapentin .  # Annual dilated diabetic eye exams.   - Diet: Make healthy diabetic food choices - Life style / activity / exercise: Discussed.  2. Blood pressure  -  BP Readings from Last 1 Encounters:  02/11/24 138/70    - Control is in target.  - No change in current plans.    3. Lipid status / Hyperlipidemia - Last  Lab Results  Component Value Date   LDLCALC 49 04/29/2023   - Continue rosuvastatin  20 mg daily.  Zetia  10 mg daily.  Diagnoses and all orders for this visit:  Type 2 diabetes mellitus with other specified complication, with long-term current use of insulin  (HCC) -     POCT glycosylated hemoglobin (Hb A1C)  Uncontrolled type 2 diabetes mellitus  with hyperglycemia (HCC)    DISPOSITION Follow up in clinic in 3 months suggested.  Labs on the same day of the visit.   All questions answered and patient verbalized understanding of the plan.  Mariah Earnestene Angello, MD [facility] Endocrinology [facility] [address] [place], [address] [place], [place] [address] Phone # [phone]  At least part of this note was generated using voice recognition software. Inadvertent word errors may have occurred, which were not recognized during the proofreading process.

## 2024-02-11 NOTE — Patient Instructions (Addendum)
 Diabetes regimen: Semeglee long acting 18 units daily. Novolog  12 units with breakfast and 12 units with lunch and 12 units dinner. Ozempic  0.5 mg weekly. Farxiga 10 mg daily.  Actos  15 mg daily.

## 2024-02-26 ENCOUNTER — Telehealth: Payer: Self-pay

## 2024-02-26 NOTE — Telephone Encounter (Signed)
 Copied from CRM [id number]. Topic: Appointments - Appointment Scheduling >> Feb 26, 2024  8:09 AM Mariah Peterson wrote: Patient looking for back pain medication.  Pls call her number on file is good

## 2024-02-27 ENCOUNTER — Encounter: Payer: Self-pay | Admitting: Internal Medicine

## 2024-02-27 ENCOUNTER — Ambulatory Visit: Admitting: Internal Medicine

## 2024-02-27 VITALS — BP 110/70 | HR 70 | Temp 97.4°F | Wt 91.1 lb

## 2024-02-27 DIAGNOSIS — M549 Dorsalgia, unspecified: Secondary | ICD-10-CM

## 2024-02-27 LAB — POC URINALSYSI DIPSTICK (AUTOMATED)
Bilirubin, UA: NEGATIVE
Blood, UA: NEGATIVE
Glucose, UA: POSITIVE — AB
Ketones, UA: NEGATIVE
Leukocytes, UA: NEGATIVE
Nitrite, UA: NEGATIVE
Protein, UA: POSITIVE — AB
Spec Grav, UA: 1.025 (ref 1.010–1.025)
Urobilinogen, UA: 0.2 U/dL
pH, UA: 5.5 (ref 5.0–8.0)

## 2024-02-27 MED ORDER — MELOXICAM 7.5 MG PO TABS: 7.5000 mg | ORAL_TABLET | Freq: Every day | ORAL | 0 refills | Status: AC

## 2024-02-27 NOTE — Progress Notes (Signed)
 Established Patient Office Visit     CC/Reason for Visit: Back pain  HPI: Mariah Peterson is a 78 y.o. female who is coming in today for the above mentioned reasons.  Has been having low back pain for the past 3 days.  It hurts with bending and twisting motions of the torso.  It is bilateral, no urinary symptoms, no abdominal pain, no nausea or vomiting, no bowel disturbance.  No radiation.   Past Medical/Surgical History: Past Medical History:  Diagnosis Date   Chest pain 11/30/2020   Coronary artery disease    Diabetes mellitus without complication (HCC)    Glaucoma    History of chicken pox    Hypertension     Past Surgical History:  Procedure Laterality Date   BREAST BIOPSY Right 2017   benign   CARDIAC CATHETERIZATION     CATARACT EXTRACTION, BILATERAL Bilateral    CESAREAN SECTION     COLONOSCOPY  1980's   CORONARY ARTERY BYPASS GRAFT N/A 12/05/2020   Procedure: CORONARY ARTERY BYPASS GRAFTING (CABG), ON PUMP, TIMES FOUR, USING LEFT INTERNAL MAMMARY ARTERY AND BILATERAL ENDOSCOPICALLY HARVESTED GREATER SAPHENOUS VEINS;  Surgeon: Hilarie Lovely, MD;  Location: [facility] OR;  Service: Open Heart Surgery;  Laterality: N/A;   LEFT HEART CATH AND CORONARY ANGIOGRAPHY N/A 11/30/2020   Procedure: LEFT HEART CATH AND CORONARY ANGIOGRAPHY;  Surgeon: Arty Binning, MD;  Location: [facility];  Service: Cardiovascular;  Laterality: N/A;   TEE WITHOUT CARDIOVERSION N/A 12/05/2020   Procedure: TRANSESOPHAGEAL ECHOCARDIOGRAM (TEE);  Surgeon: Hilarie Lovely, MD;  Location: [facility] OR;  Service: Open Heart Surgery;  Laterality: N/A;   TUBAL LIGATION      Social History:  reports that she has never smoked. She has never used smokeless tobacco. She reports that she does not drink alcohol and does not use drugs.  Allergies: No Known Allergies  Family History:  Family History  Problem Relation Age of Onset   Hypertension Mother    Diabetes Brother    CAD Brother     Hypertension Brother    Diabetes Brother    Hypertension Brother    Colon cancer Neg Hx    Esophageal cancer Neg Hx    Pancreatic cancer Neg Hx    Liver disease Neg Hx    Stomach cancer Neg Hx    Rectal cancer Neg Hx    Colon polyps Neg Hx      Current Outpatient Medications:    amLODipine  (NORVASC ) 10 MG tablet, Take 1 tablet (10 mg total) by mouth daily. (Patient taking differently: Take 5 mg by mouth daily.), Disp: 90 tablet, Rfl: 0   aspirin  EC 81 MG tablet, Take 1 tablet (81 mg total) by mouth daily. Swallow whole., Disp: 90 tablet, Rfl: 3   calcium  carbonate (OSCAL) 1500 (600 Ca) MG TABS tablet, Take 1 tablet by mouth 2 (two) times daily with a meal., Disp: , Rfl:    cloNIDine  (CATAPRES ) 0.1 MG tablet, Take 1 tablet (0.1 mg total) by mouth 2 (two) times daily., Disp: 60 tablet, Rfl: 1   Continuous Glucose Sensor (DEXCOM G7 SENSOR) MISC, 1 Device by Does not apply route continuous., Disp: 9 each, Rfl: 3   dapagliflozin propanediol (FARXIGA) 10 MG TABS tablet, Take by mouth daily., Disp: , Rfl:    ezetimibe  (ZETIA ) 10 MG tablet, Take 1 tablet (10 mg total) by mouth daily. Take 1 tablet by mouth once daily, Disp: 90 tablet, Rfl: 3  Ferrous Sulfate (IRON PO), Take by mouth., Disp: , Rfl:    glucose blood test strip, Use as instructed to test blood sugar 3 times daily E11.65, Disp: 270 each, Rfl: 1   insulin  aspart (NOVOLOG  FLEXPEN) 100 UNIT/ML FlexPen, 8 UNITS BEFORE Breakfast on exercise days and if eating eggs 10 Units on other days LUNCH COVERAGE: NOVOLOG  8 UNITS if eating at home and 10 units for fast food Dinner 8-10 units Novolog  before eating, Disp: 15 mL, Rfl: 3   insulin  glargine-yfgn (SEMGLEE , YFGN,) 100 UNIT/ML Pen, INJECT 16 UNITS SUBCUTANEOUSLY ONCE DAILY, Disp: 15 mL, Rfl: 2   losartan  (COZAAR ) 100 MG tablet, Take 1 tablet by mouth once daily, Disp: 90 tablet, Rfl: 1   meloxicam (MOBIC) 7.5 MG tablet, Take 1 tablet (7.5 mg total) by mouth daily., Disp: 30 tablet, Rfl: 0    metoprolol  tartrate (LOPRESSOR ) 25 MG tablet, Take 1 tablet by mouth twice daily, Disp: 180 tablet, Rfl: 0   pioglitazone  (ACTOS ) 15 MG tablet, Take 1 tablet by mouth once daily, Disp: 30 tablet, Rfl: 5   rosuvastatin  (CRESTOR ) 20 MG tablet, Take 1 tablet (20 mg total) by mouth daily., Disp: 90 tablet, Rfl: 1   Semaglutide ,0.25 or 0.5MG /DOS, 2 MG/3ML SOPN, Inject 0.5 mg into the skin once a week., Disp: 9 mL, Rfl: 3  Review of Systems:  Negative unless indicated in HPI.   Physical Exam: Vitals:   02/27/24 1347  BP: 110/70  Pulse: 70  Temp: (!) 97.4 F (36.3 C)  TempSrc: Oral  SpO2: 98%  Weight: 91 lb 1.6 oz (41.3 kg)    Body mass index is 18.4 kg/m.    Impression and Plan:  Back pain, unspecified back location, unspecified back pain laterality, unspecified chronicity -     POCT Urinalysis Dipstick (Automated) -     Meloxicam; Take 1 tablet (7.5 mg total) by mouth daily.  Dispense: 30 tablet; Refill: 0  -Suspect this is simply musculoskeletal pain given lack of red flag signs/symptoms. -Advised icing, as needed NSAIDs, back stretches, local massage therapy.  Will give a 10-day course of meloxicam. -If no improvement in 3 to 4 weeks, can consider referral to physical therapy.    Time spent:22 minutes reviewing chart, interviewing and examining patient and formulating plan of care.     Marguerita Shih, MD [facility]

## 2024-02-27 NOTE — Telephone Encounter (Signed)
 Appointment scheduled.

## 2024-03-17 ENCOUNTER — Other Ambulatory Visit: Payer: Self-pay

## 2024-03-17 DIAGNOSIS — E1169 Type 2 diabetes mellitus with other specified complication: Secondary | ICD-10-CM

## 2024-03-17 MED ORDER — PIOGLITAZONE HCL 15 MG PO TABS: 15.0000 mg | ORAL_TABLET | Freq: Every day | ORAL | 5 refills | Status: AC

## 2024-03-18 ENCOUNTER — Ambulatory Visit
Admission: RE | Admit: 2024-03-18 | Discharge: 2024-03-18 | Disposition: A | Discharge status: Home / Self Care | Source: Ambulatory Visit | Attending: Internal Medicine | Admitting: Internal Medicine

## 2024-03-18 ENCOUNTER — Ambulatory Visit (INDEPENDENT_AMBULATORY_CARE_PROVIDER_SITE_OTHER): Admitting: Internal Medicine

## 2024-03-18 ENCOUNTER — Encounter: Payer: Self-pay | Admitting: Internal Medicine

## 2024-03-18 VITALS — BP 200/79 | HR 70 | Temp 98.2°F | Ht <= 58 in | Wt 92.4 lb

## 2024-03-18 DIAGNOSIS — D649 Anemia, unspecified: Secondary | ICD-10-CM

## 2024-03-18 DIAGNOSIS — I10 Essential (primary) hypertension: Secondary | ICD-10-CM

## 2024-03-18 DIAGNOSIS — E782 Mixed hyperlipidemia: Secondary | ICD-10-CM | POA: Diagnosis not present

## 2024-03-18 DIAGNOSIS — Z1231 Encounter for screening mammogram for malignant neoplasm of breast: Secondary | ICD-10-CM

## 2024-03-18 DIAGNOSIS — E1122 Type 2 diabetes mellitus with diabetic chronic kidney disease: Secondary | ICD-10-CM

## 2024-03-18 DIAGNOSIS — N1832 Chronic kidney disease, stage 3b: Secondary | ICD-10-CM

## 2024-03-18 DIAGNOSIS — Z794 Long term (current) use of insulin: Secondary | ICD-10-CM

## 2024-03-18 DIAGNOSIS — Z0000 Encounter for general adult medical examination without abnormal findings: Secondary | ICD-10-CM | POA: Diagnosis not present

## 2024-03-18 LAB — LIPID PANEL
Cholesterol: 146 mg/dL (ref 0–200)
HDL: 59.8 mg/dL (ref >39.00)
LDL Cholesterol: 65 mg/dL (ref 0–99)
NonHDL: 86.36
Total CHOL/HDL Ratio: 2
Triglycerides: 108 mg/dL (ref 0.0–149.0)
VLDL: 21.6 mg/dL (ref 0.0–40.0)

## 2024-03-18 NOTE — Progress Notes (Signed)
 Established Patient Office Visit     CC/Reason for Visit: Subsequent Medicare wellness visit and follow-up chronic conditions  HPI: Mariah Peterson is a 78 y.o. female who is coming in today for the above mentioned reasons. Past Medical History is significant for: Hypertension, hyperlipidemia, insulin -dependent diabetes, chronic kidney disease stage IIIb.  She has not taken her blood pressure medication today.  She has no acute concerns or complaints.  Is due for mammogram and bone density, all immunizations are up-to-date.  Eye and dental care are up-to-date.   Past Medical/Surgical History: Past Medical History:  Diagnosis Date   Chest pain 11/30/2020   Coronary artery disease    Diabetes mellitus without complication (HCC)    Glaucoma    History of chicken pox    Hypertension     Past Surgical History:  Procedure Laterality Date   BREAST BIOPSY Right 2017   benign   CARDIAC CATHETERIZATION     CATARACT EXTRACTION, BILATERAL Bilateral    CESAREAN SECTION     COLONOSCOPY  1980's   CORONARY ARTERY BYPASS GRAFT N/A 12/05/2020   Procedure: CORONARY ARTERY BYPASS GRAFTING (CABG), ON PUMP, TIMES FOUR, USING LEFT INTERNAL MAMMARY ARTERY AND BILATERAL ENDOSCOPICALLY HARVESTED GREATER SAPHENOUS VEINS;  Surgeon: Hilarie Lovely, MD;  Location: [facility] OR;  Service: Open Heart Surgery;  Laterality: N/A;   LEFT HEART CATH AND CORONARY ANGIOGRAPHY N/A 11/30/2020   Procedure: LEFT HEART CATH AND CORONARY ANGIOGRAPHY;  Surgeon: Arty Binning, MD;  [facility];  Service: Cardiovascular;  Laterality: N/A;   TEE WITHOUT CARDIOVERSION N/A 12/05/2020   Procedure: TRANSESOPHAGEAL ECHOCARDIOGRAM (TEE);  Surgeon: Hilarie Lovely, MD;  Location: [facility] OR;  Service: Open Heart Surgery;  Laterality: N/A;   TUBAL LIGATION      Social History:  reports that she has never smoked. She has never used smokeless tobacco. She reports that she does not drink alcohol and does not use  drugs.  Allergies: No Known Allergies  Family History:  Family History  Problem Relation Age of Onset   Hypertension Mother    Diabetes Brother    CAD Brother    Hypertension Brother    Diabetes Brother    Hypertension Brother    Colon cancer Neg Hx    Esophageal cancer Neg Hx    Pancreatic cancer Neg Hx    Liver disease Neg Hx    Stomach cancer Neg Hx    Rectal cancer Neg Hx    Colon polyps Neg Hx      Current Outpatient Medications:    amLODipine  (NORVASC ) 10 MG tablet, Take 1 tablet (10 mg total) by mouth daily. (Patient taking differently: Take 5 mg by mouth daily.), Disp: 90 tablet, Rfl: 0   aspirin  EC 81 MG tablet, Take 1 tablet (81 mg total) by mouth daily. Swallow whole., Disp: 90 tablet, Rfl: 3   calcium  carbonate (OSCAL) 1500 (600 Ca) MG TABS tablet, Take 1 tablet by mouth 2 (two) times daily with a meal., Disp: , Rfl:    cloNIDine  (CATAPRES ) 0.1 MG tablet, Take 1 tablet (0.1 mg total) by mouth 2 (two) times daily., Disp: 60 tablet, Rfl: 1   Continuous Glucose Sensor (DEXCOM G7 SENSOR) MISC, 1 Device by Does not apply route continuous., Disp: 9 each, Rfl: 3   dapagliflozin propanediol (FARXIGA) 10 MG TABS tablet, Take by mouth daily., Disp: , Rfl:    ezetimibe  (ZETIA ) 10 MG tablet, Take 1 tablet (10 mg total) by mouth  daily. Take 1 tablet by mouth once daily, Disp: 90 tablet, Rfl: 3   Ferrous Sulfate (IRON PO), Take by mouth., Disp: , Rfl:    glucose blood test strip, Use as instructed to test blood sugar 3 times daily E11.65, Disp: 270 each, Rfl: 1   insulin  aspart (NOVOLOG  FLEXPEN) 100 UNIT/ML FlexPen, 8 UNITS BEFORE Breakfast on exercise days and if eating eggs 10 Units on other days LUNCH COVERAGE: NOVOLOG  8 UNITS if eating at home and 10 units for fast food Dinner 8-10 units Novolog  before eating, Disp: 15 mL, Rfl: 3   insulin  glargine-yfgn (SEMGLEE , YFGN,) 100 UNIT/ML Pen, INJECT 16 UNITS SUBCUTANEOUSLY ONCE DAILY, Disp: 15 mL, Rfl: 2   losartan  (COZAAR ) 100 MG  tablet, Take 1 tablet by mouth once daily, Disp: 90 tablet, Rfl: 1   meloxicam  (MOBIC ) 7.5 MG tablet, Take 1 tablet (7.5 mg total) by mouth daily., Disp: 30 tablet, Rfl: 0   metoprolol  tartrate (LOPRESSOR ) 25 MG tablet, Take 1 tablet by mouth twice daily, Disp: 180 tablet, Rfl: 0   pioglitazone  (ACTOS ) 15 MG tablet, Take 1 tablet (15 mg total) by mouth daily., Disp: 30 tablet, Rfl: 5   rosuvastatin  (CRESTOR ) 20 MG tablet, Take 1 tablet (20 mg total) by mouth daily., Disp: 90 tablet, Rfl: 1   Semaglutide ,0.25 or 0.5MG /DOS, 2 MG/3ML SOPN, Inject 0.5 mg into the skin once a week., Disp: 9 mL, Rfl: 3  Review of Systems:  Negative unless indicated in HPI.   Physical Exam: Vitals:   03/18/24 0924 03/18/24 0927  BP: (!) 190/90 (!) 200/79  Pulse: 70   Temp: 98.2 F (36.8 C)   TempSrc: Oral   SpO2: 99%   Weight: 92 lb 6.4 oz (41.9 kg)   Height: 4\' 9"  (1.448 m)     Body mass index is 20 kg/m.   Physical Exam Vitals reviewed.  Constitutional:      General: She is not in acute distress.    Appearance: Normal appearance. She is not ill-appearing, toxic-appearing or diaphoretic.  HENT:     Head: Normocephalic.     Right Ear: Tympanic membrane, ear canal and external ear normal. There is no impacted cerumen.     Left Ear: Tympanic membrane, ear canal and external ear normal. There is no impacted cerumen.     Nose: Nose normal.     Mouth/Throat:     Mouth: Mucous membranes are moist.     Pharynx: Oropharynx is clear. No oropharyngeal exudate or posterior oropharyngeal erythema.  Eyes:     General: No scleral icterus.       Right eye: No discharge.        Left eye: No discharge.     Conjunctiva/sclera: Conjunctivae normal.     Pupils: Pupils are equal, round, and reactive to light.  Neck:     Vascular: No carotid bruit.  Cardiovascular:     Rate and Rhythm: Normal rate and regular rhythm.     Pulses: Normal pulses.     Heart sounds: Normal heart sounds.  Pulmonary:     Effort:  Pulmonary effort is normal. No respiratory distress.     Breath sounds: Normal breath sounds.  Abdominal:     General: Abdomen is flat. Bowel sounds are normal.     Palpations: Abdomen is soft.  Musculoskeletal:        General: Normal range of motion.     Cervical back: Normal range of motion.  Skin:    General: Skin is warm  and dry.  Neurological:     General: No focal deficit present.     Mental Status: She is alert and oriented to person, place, and time. Mental status is at baseline.  Psychiatric:        Mood and Affect: Mood normal.        Behavior: Behavior normal.        Thought Content: Thought content normal.        Judgment: Judgment normal.    Subsequent Medicare wellness visit   1. Risk factors, based on past  M,S,F - Cardiac Risk Factors include: advanced age (>15men, >13 women);diabetes mellitus;hypertension   2.  Physical activities: Dietary issues and exercise activities discussed:      3.  Depression/mood:  Flowsheet Row Office Visit from 12/23/2023 in [facility] Total Score 1        4.  ADL's:    03/18/2024    9:16 AM  In your present state of health, do you have any difficulty performing the following activities:  Hearing? 0  Vision? 0  Difficulty concentrating or making decisions? 0  Walking or climbing stairs? 0  Dressing or bathing? 0  Doing errands, shopping? 0  Preparing Food and eating ? N  Using the Toilet? N  In the past six months, have you accidently leaked urine? N  Do you have problems with loss of bowel control? N  Managing your Medications? N  Managing your Finances? N  Housekeeping or managing your Housekeeping? N     5.  Fall risk:     06/27/2022   10:44 AM 10/25/2022   10:25 AM 12/23/2023    1:07 PM 02/27/2024    2:17 PM 03/18/2024    9:18 AM  Fall Risk  Falls in the past year? 1 0 0 0 0  Was there an injury with Fall? 0 0 0 0 0  Was there an injury with Fall? - Comments No injury or  medical attention needed      Fall Risk Category Calculator 1 0 0 0 0  Fall Risk Category (Retired) Low Low     (RETIRED) Patient Fall Risk Level Low fall risk Low fall risk     Patient at Risk for Falls Due to No Fall Risks No Fall Risks     Fall risk Follow up  Falls evaluation completed Falls evaluation completed Falls evaluation completed Falls evaluation completed     6.  Home safety: No problems identified   7.  Height weight, and visual acuity: height and weight as above, vision/hearing: Vision Screening   Right eye Left eye Both eyes  Without correction 20/25 20/25 20/25   With correction        8.  Counseling: Counseling given: Not Answered    9. Lab orders based on risk factors: Laboratory update will be reviewed   10. Cognitive assessment:        03/18/2024    9:18 AM 06/27/2022   10:47 AM  6CIT Screen  What Year? 0 points 0 points  What month? 0 points 0 points  What time? 0 points 0 points  Count back from 20 0 points 0 points  Months in reverse 0 points 0 points  Repeat phrase 0 points 0 points  Total Score 0 points 0 points     11. Screening: Patient provided with a written and personalized 5-10 year screening schedule in the AVS. Health Maintenance  Topic Date Due   COVID-19  Vaccine (6 - 2024-25 season) 06/23/2023   Eye exam for diabetics  12/13/2023   Complete foot exam   01/31/2024   Yearly kidney health urinalysis for diabetes  04/28/2024   Flu Shot  05/22/2024   Hemoglobin A1C  08/12/2024   Yearly kidney function blood test for diabetes  12/31/2024   Medicare Annual Wellness Visit  03/18/2025   DTaP/Tdap/Td vaccine (2 - Td or Tdap) 12/03/2028   Pneumonia Vaccine  Completed   DEXA scan (bone density measurement)  Completed   Hepatitis C Screening  Completed   Zoster (Shingles) Vaccine  Completed   HPV Vaccine  Aged Out   Meningitis B Vaccine  Aged Out   Colon Cancer Screening  Discontinued    12. Provider List Update: Patient Care Team     Relationship Specialty Notifications Start End  Zilphia Hilt, Charyl Coppersmith, MD PCP - General Internal Medicine  05/31/22   Hugh Madura, MD PCP - Cardiology Cardiology  12/02/20   Bland Bunnell, MD Referring Physician Optometry  09/30/19   Albertina Hugger, MD Consulting Physician Gastroenterology  09/30/19   Maris Sickle, MD Consulting Physician Ophthalmology  10/19/20   Pa, [facility]    05/17/22   Carnell Christian, [facility]  Pharmacist  02/26/23      13. Advance Directives: Does Patient Have a Medical Advance Directive?: Yes Type of Advance Directive: Healthcare Power of Attorney, Living will, Out of facility DNR (pink MOST or yellow form) Does patient want to make changes to medical advance directive?: No - Patient declined Copy of Healthcare Power of Attorney in Chart?: No - copy requested  14. Opioids: Patient is not on any opioid prescriptions and has no risk factors for a substance use disorder.   15.   Goals       Learn More About My Health                            Why is this important?   The best way to learn about your health and care is by talking to the doctor and nurse.  They will answer your questions and give you information in the way that you like best.    Notes:       No current goals (pt-stated)      No follow up required (pt-stated)      Care Coordination Interventions: Reviewed medications with patient and discussed importance of medication adherence Reviewed scheduled/upcoming provider appointments including: pending appointments and request pt to scheduled her AWV at her provides office on tomorrow's office visit.  Screening for signs and symptoms of depression related to chronic disease state  Assessed social determinant of health barriers Pt reports her diabetes is managed with CBG <100 with AM reads        Patient Stated      Monitor my diet to keep my diabetes under control.      Patient Stated (pt-stated)      To maintain  my current health status by continuing to eat healthy, stay physically active and socially active.         I have personally reviewed and noted the following in the patient's chart:   Medical and social history Use of alcohol, tobacco or illicit drugs  Current medications and supplements Functional ability and status Nutritional status Physical activity Advanced directives List of other physicians Hospitalizations, surgeries, and ER visits in previous 12 months Vitals Screenings  to include cognitive, depression, and falls Referrals and appointments  In addition, I have reviewed and discussed with patient certain preventive protocols, quality metrics, and best practice recommendations. A written personalized care plan for preventive services as well as general preventive health recommendations were provided to patient.   Impression and Plan:  Mixed hyperlipidemia -     Lipid panel; Future  Primary hypertension  Type 2 diabetes mellitus with stage 3b chronic kidney disease, with long-term current use of insulin  (HCC)  Encounter for Medicare annual wellness exam  Anemia, unspecified type  Encounter for screening mammogram for malignant neoplasm of breast -     Digital Screening Mammogram, Left and Right; Future   -Recommend routine eye and dental care. -Healthy lifestyle discussed in detail. -Labs to be updated today. -Prostate cancer screening: N/A Health Maintenance  Topic Date Due   COVID-19 Vaccine (6 - 2024-25 season) 06/23/2023   Eye exam for diabetics  12/13/2023   Complete foot exam   01/31/2024   Yearly kidney health urinalysis for diabetes  04/28/2024   Flu Shot  05/22/2024   Hemoglobin A1C  08/12/2024   Yearly kidney function blood test for diabetes  12/31/2024   Medicare Annual Wellness Visit  03/18/2025   DTaP/Tdap/Td vaccine (2 - Td or Tdap) 12/03/2028   Pneumonia Vaccine  Completed   DEXA scan (bone density measurement)  Completed   Hepatitis C  Screening  Completed   Zoster (Shingles) Vaccine  Completed   HPV Vaccine  Aged Out   Meningitis B Vaccine  Aged Out   Colon Cancer Screening  Discontinued     - Immunizations are up-to-date. - Schedule for mammogram and DEXA, colonoscopy is up-to-date. - Blood pressure is elevated but states she has not taken her medication today.     Marguerita Shih, MD [facility] at [facility]

## 2024-03-19 ENCOUNTER — Ambulatory Visit: Payer: Self-pay | Admitting: Internal Medicine

## 2024-03-23 ENCOUNTER — Telehealth: Payer: Self-pay

## 2024-03-23 MED ORDER — INSULIN GLARGINE-YFGN 100 UNIT/ML ~~LOC~~ SOPN: PEN_INJECTOR | SUBCUTANEOUS | 4 refills | Status: AC

## 2024-03-23 NOTE — Telephone Encounter (Signed)
 I refilled.  Semglee  16 units daily.

## 2024-03-23 NOTE — Addendum Note (Signed)
 Addended by: Knut Rondinelli, [place] on: 03/23/2024 03:33 PM   Modules accepted: Orders

## 2024-03-23 NOTE — Telephone Encounter (Signed)
 Pharmacy needs refill for patient's lantus .

## 2024-03-25 ENCOUNTER — Other Ambulatory Visit: Payer: Self-pay

## 2024-04-16 ENCOUNTER — Other Ambulatory Visit: Payer: Self-pay

## 2024-04-16 DIAGNOSIS — E7800 Pure hypercholesterolemia, unspecified: Secondary | ICD-10-CM

## 2024-04-16 DIAGNOSIS — E1165 Type 2 diabetes mellitus with hyperglycemia: Secondary | ICD-10-CM

## 2024-04-16 MED ORDER — CLONIDINE HCL 0.1 MG PO TABS: 0.1000 mg | ORAL_TABLET | Freq: Two times a day (BID) | ORAL | 1 refills | Status: DC

## 2024-04-16 MED ORDER — ROSUVASTATIN CALCIUM 20 MG PO TABS: 20.0000 mg | ORAL_TABLET | Freq: Every day | ORAL | 1 refills | Status: AC

## 2024-04-17 ENCOUNTER — Telehealth: Payer: Self-pay

## 2024-04-17 NOTE — Progress Notes (Signed)
 04/17/2024 Name: Mariah Peterson MRN: [id number] DOB: Jul 19, 1946  Chief Complaint  Patient presents with   Medication Management   Diabetes    Mariah Peterson is a 78 y.o. year old female who presented for a telephone visit.   They were referred to the pharmacist by a quality report for assistance in managing diabetes.    Subjective:  Care Team: Primary Care Provider: Theophilus Andrews, Tully GRADE, MD   Medication Access/Adherence  Current Pharmacy:  [facility] [address] [place] - [address] [place] [address] Phone: [phone] Fax: [phone]  [facility] - [place], [place] - [address] [place] [address] Phone: [phone] Fax: [phone]   Patient reports affordability concerns with their medications: No  Patient reports access/transportation concerns to their pharmacy: No  Patient reports adherence concerns with their medications:  No     Diabetes:  Current medications: Farxiga 10mg  daily, Ozempic  0.5mg  on Thursdays, pioglitazone  15mg  daily, Semglee  16 units daily, Novolog  10 units with meals Medications tried in the past: Jardiance  (cost), Metformin  (kidney)  Current glucose readings:  Using Dexcom G7 sensors, unable to pair with office Is monitored by Dr. Mercie at endo Denies any low BG readings below 70. Reports improved readings since starting farxiga and has been better about remembering novolog  insulin   Observed patterns:  Patient denies hypoglycemic s/sx including dizziness, shakiness, sweating. Patient denies hyperglycemic symptoms including polyuria, polydipsia, polyphagia, nocturia, neuropathy, blurred vision.   Current medication access support: Farxiga through [place]&Me, Ozempic  through NOVO  Hypertension:  Current medications: Losartan  100mg  (not dispensed since aug 2024, states she has and is still taking), Amlodipine  5mg  daily, Metoprolol   25mg  BID, Clonidine  0.1mg  BID   Patient does not have a validated, automated, upper arm home BP cuff Current blood pressure readings readings: not checking  Patient denies hypotensive s/sx including dizziness, lightheadedness.  Patient denies hypertensive symptoms including headache, chest pain, shortness of breath    Objective:  Lab Results  Component Value Date   HGBA1C 9.3 (A) 02/11/2024    Lab Results  Component Value Date   CREATININE 2.3 (A) 01/01/2024   BUN 33 (A) 01/01/2024   NA 136 (A) 01/01/2024   K 5.0 01/01/2024   CL 108 01/01/2024   CO2 21 01/01/2024    Lab Results  Component Value Date   CHOL 146 03/18/2024   HDL 59.80 03/18/2024   LDLCALC 65 03/18/2024   LDLDIRECT 33.0 01/23/2022   TRIG 108.0 03/18/2024   CHOLHDL 2 03/18/2024    Medications Reviewed Today     Reviewed by Lionell Jon DEL, RPH (Pharmacist) on 04/17/24 at 1315  Med List Status: <None>   Medication Order Taking? Sig Documenting Provider Last Dose Status Informant  amLODipine  (NORVASC ) 10 MG tablet [id number]  Take 1 tablet (10 mg total) by mouth daily.  Patient taking differently: Take 5 mg by mouth daily.   Von [place], MD  Active   aspirin  EC 81 MG tablet [id number] Yes Take 1 tablet (81 mg total) by mouth daily. Swallow whole. Jeffrie Oneil BROCKS, MD  Active   calcium  carbonate (OSCAL) 1500 (600 Ca) MG TABS tablet [id number]  Take 1 tablet by mouth 2 (two) times daily with a meal. [provider]  Active Self  cloNIDine  (CATAPRES ) 0.1 MG tablet [id number] Yes Take 1 tablet (0.1 mg total) by mouth 2 (two) times daily. Thapa, [place], MD  Active   Continuous Glucose Sensor (  DEXCOM G7 SENSOR) MISC [id number] Yes 1 Device by Does not apply route continuous. Thapa, [place], MD  Active   dapagliflozin propanediol (FARXIGA) 10 MG TABS tablet [id number] Yes Take by mouth daily. [provider]  Active   ezetimibe  (ZETIA ) 10 MG tablet [id number] Yes Take 1 tablet (10 mg total) by mouth  daily. Take 1 tablet by mouth once daily Thapa, [place], MD  Active   Ferrous Sulfate (IRON PO) [id number] Yes Take by mouth. [provider]  Active   glucose blood test strip [id number]  Use as instructed to test blood sugar 3 times daily E11.65 Von [place], MD  Active Self           Med Note ELSWORTH, JACQUELINE L   Wed Sep 25, 2023  9:25 AM)    insulin  aspart (NOVOLOG  FLEXPEN) 100 UNIT/ML FlexPen [id number] Yes 8 UNITS BEFORE Breakfast on exercise days and if eating eggs 10 Units on other days LUNCH COVERAGE: NOVOLOG  8 UNITS if eating at home and 10 units for fast food Dinner 8-10 units Novolog  before eating Thapa, [place], MD  Active            Med Note [name] JON DEL   Tue Dec 03, 2023 10:27 AM) 10 units before each meal  insulin  glargine-yfgn (SEMGLEE , YFGN,) 100 UNIT/ML Pen [id number] Yes INJECT 16 UNITS SUBCUTANEOUSLY ONCE DAILY Thapa, [place], MD  Active   losartan  (COZAAR ) 100 MG tablet [id number]  Take 1 tablet by mouth once daily Theophilus Andrews, Tully GRADE, MD  Active   meloxicam  (MOBIC ) 7.5 MG tablet [id number] Yes Take 1 tablet (7.5 mg total) by mouth daily. Theophilus Andrews, Tully GRADE, MD  Active   metoprolol  tartrate (LOPRESSOR ) 25 MG tablet [id number] Yes Take 1 tablet by mouth twice daily Theophilus Andrews, Estela Y, MD  Active   pioglitazone  (ACTOS ) 15 MG tablet [id number] Yes Take 1 tablet (15 mg total) by mouth daily. Thapa, [place], MD  Active   rosuvastatin  (CRESTOR ) 20 MG tablet [id number] Yes Take 1 tablet (20 mg total) by mouth daily. Thapa, [place], MD  Active   Semaglutide ,0.25 or 0.5MG /DOS, 2 MG/3ML SOPN [id number] Yes Inject 0.5 mg into the skin once a week. Thapa, [place], MD  Active               Assessment/Plan:   Diabetes: - Currently uncontrolled - Reviewed long term cardiovascular and renal outcomes of uncontrolled blood sugar - Reviewed goal A1c, goal fasting, and goal 2 hour post prandial glucose - Reviewed dietary modifications including low carb diet -  Recommend to continue current medication therapy and keep follow up with endo. If A1C still elevated at next follow up, will likely need insulin  adjustment  - Patient denies personal or family history of multiple endocrine neoplasia type 2, medullary thyroid  cancer; personal history of pancreatitis or gallbladder disease   Hypertension: - Currently uncontrolled - Reviewed long term cardiovascular and renal outcomes of uncontrolled blood pressure - Reviewed appropriate blood pressure monitoring technique and reviewed goal blood pressure. Recommended to check home blood pressure and heart rate daily - Recommend to continue current medication therapy, had not taken medication at time of last BP check. If remains elevated, may need to increase amlodipine  and re-confirm patient is taking her losartan      Follow Up Plan: 2 months  JON DEL Lindau, PharmD Clinical Pharmacist [phone]

## 2024-05-18 ENCOUNTER — Telehealth: Payer: Self-pay | Admitting: *Deleted

## 2024-05-18 NOTE — Telephone Encounter (Signed)
 Patient was identified as falling into the [place] Measure - Diabetes.   Patient was: Appointment already scheduled for:  follow up with Dr Mercie 05/28/24.

## 2024-05-28 ENCOUNTER — Encounter: Payer: Self-pay | Admitting: Endocrinology

## 2024-05-28 ENCOUNTER — Ambulatory Visit (INDEPENDENT_AMBULATORY_CARE_PROVIDER_SITE_OTHER): Admitting: Endocrinology

## 2024-05-28 ENCOUNTER — Ambulatory Visit: Payer: Self-pay | Admitting: Endocrinology

## 2024-05-28 VITALS — BP 180/70 | HR 79 | Resp 16 | Ht <= 58 in | Wt 91.2 lb

## 2024-05-28 DIAGNOSIS — Z794 Long term (current) use of insulin: Secondary | ICD-10-CM

## 2024-05-28 DIAGNOSIS — E1169 Type 2 diabetes mellitus with other specified complication: Secondary | ICD-10-CM | POA: Diagnosis not present

## 2024-05-28 LAB — POCT GLYCOSYLATED HEMOGLOBIN (HGB A1C): Hemoglobin A1C: 7.5 % — AB (ref 4.0–5.6)

## 2024-05-28 NOTE — Patient Instructions (Signed)
 Diabetes regimen: Semeglee long acting 16 units daily. Novolog  12 units with breakfast and 10 units with lunch and 12 units dinner. Ozempic  0.5 mg weekly. Farxiga 10 mg daily.  Actos  15 mg daily.

## 2024-05-28 NOTE — Progress Notes (Signed)
 Outpatient Endocrinology Note [place] Jodine Muchmore, MD  05/28/24  Patient's Name: Mariah Peterson    DOB: [birth year]    MRN: [id number]                                                    REASON OF VISIT: Follow-up for type 2 diabetes mellitus  PCP: Theophilus Andrews, Tully GRADE, MD  HISTORY OF PRESENT ILLNESS:   LYLIAN SANAGUSTIN is a 78 y.o. old female with past medical history listed below, is here for follow-up follow up for type 2 diabetes mellitus.    Pertinent Diabetes History: Patient was diagnosed with type 2 diabetes mellitus around 1990s.  She was started on insulin  therapy around 2020.  Chronic Diabetes Complications : Retinopathy: no. Last ophthalmology exam was done on annually reportedly, last exam in February 2024. Nephropathy: CKD, on losartan , following with nephrology.   Peripheral neuropathy: yes, on gabapentin . Coronary artery disease: yes Stroke: no  Relevant comorbidities and cardiovascular risk factors: Obesity: no Body mass index is 19.74 kg/m.  Hypertension: yes Hyperlipidemia. Yes, on statin and zetia .   Current / Home Diabetic regimen includes: Lantus  / Semeglee 16 units daily in the morning Novolog  10 units with BF, 10 with lunch or supper. Ozempic  0.5 mg weekly.  Farxiga 10 mg daily. Actos  15 mg daily.  Prior diabetic medications: Farxiga and Jardiance  planned in the past, not cost effective.  Glycemic data:   CONTINUOUS GLUCOSE MONITORING SYSTEM (CGMS) INTERPRETATION:           Dexcom G7 CGM-  Sensor Download (Sensor download was reviewed and summarized below.) Dates: July 25 to August 7 , 2025  Glucose Management Indicator: 7.7% Sensor Average: 182 SD 43 Sensor usage: 87 %    Previous,    Impression: Mostly acceptable blood sugar.  Improvement on blood sugar compared to previous monitor.  Frequent hyperglycemia with blood sugar up to 250 range mainly with breakfast and supper.  Blood sugar overnight and in the afternoon acceptable.  No  hypoglycemia.  Hypoglycemia: Patient has no hypoglycemic episodes. Patient has hypoglycemia awareness.  Factors modifying glucose control: 1.  Diabetic diet assessment: 3 meals a day.  Bedtime snacks.  2.  Staying active or exercising: No formal exercise  3.  Medication compliance: compliant most of the time.  Interval history Dexcom CGM and CGM data as reviewed above.  Improvement on blood sugar.  Hemoglobin A1c improved to 7.5%.  She occasionally gets muscle cramps in the leg otherwise no numbness and tingling of the feet.  No vision problem.  No other complaints today.  REVIEW OF SYSTEMS As per history of present illness.   PAST MEDICAL HISTORY: Past Medical History:  Diagnosis Date   Chest pain 11/30/2020   Coronary artery disease    Diabetes mellitus without complication (HCC)    Glaucoma    History of chicken pox    Hypertension     PAST SURGICAL HISTORY: Past Surgical History:  Procedure Laterality Date   BREAST BIOPSY Right 2017   benign   CARDIAC CATHETERIZATION     CATARACT EXTRACTION, BILATERAL Bilateral    CESAREAN SECTION     COLONOSCOPY  1980's   CORONARY ARTERY BYPASS GRAFT N/A 12/05/2020   Procedure: CORONARY ARTERY BYPASS GRAFTING (CABG), ON PUMP, TIMES FOUR, USING LEFT INTERNAL MAMMARY ARTERY AND BILATERAL ENDOSCOPICALLY HARVESTED GREATER  SAPHENOUS VEINS;  Surgeon: Shyrl Linnie KIDD, MD;  Location: [facility] OR;  Service: Open Heart Surgery;  Laterality: N/A;   LEFT HEART CATH AND CORONARY ANGIOGRAPHY N/A 11/30/2020   Procedure: LEFT HEART CATH AND CORONARY ANGIOGRAPHY;  Surgeon: Claudene Victory ORN, MD;  Location: [facility] INVASIVE CV LAB;  Service: Cardiovascular;  Laterality: N/A;   TEE WITHOUT CARDIOVERSION N/A 12/05/2020   Procedure: TRANSESOPHAGEAL ECHOCARDIOGRAM (TEE);  Surgeon: Shyrl Linnie KIDD, MD;  Location: [facility] OR;  Service: Open Heart Surgery;  Laterality: N/A;   TUBAL LIGATION      ALLERGIES: No Known Allergies  FAMILY HISTORY:  Family History   Problem Relation Age of Onset   Hypertension Mother    Diabetes Brother    CAD Brother    Hypertension Brother    Diabetes Brother    Hypertension Brother    Colon cancer Neg Hx    Esophageal cancer Neg Hx    Pancreatic cancer Neg Hx    Liver disease Neg Hx    Stomach cancer Neg Hx    Rectal cancer Neg Hx    Colon polyps Neg Hx    Breast cancer Neg Hx    BRCA 1/2 Neg Hx     SOCIAL HISTORY: Social History   Socioeconomic History   Marital status: Married    Spouse name: Not on file   Number of children: 1   Years of education: 12   Highest education level: High school graduate  Occupational History   Occupation: Retired  Tobacco Use   Smoking status: Never   Smokeless tobacco: Never  Vaping Use   Vaping status: Never Used  Substance and Sexual Activity   Alcohol use: No   Drug use: No   Sexual activity: Yes  Other Topics Concern   Not on file  Social History Narrative   Not on file   Social Drivers of Health   Financial Resource Strain: Low Risk  (03/18/2024)   Overall Financial Resource Strain (CARDIA)    Difficulty of Paying Living Expenses: Not hard at all  Food Insecurity: No Food Insecurity (03/18/2024)   Hunger Vital Sign    Worried About Running Out of Food in the Last Year: Never true    Ran Out of Food in the Last Year: Never true  Transportation Needs: No Transportation Needs (03/18/2024)   PRAPARE - Administrator, Civil Service (Medical): No    Lack of Transportation (Non-Medical): No  Physical Activity: Insufficiently Active (03/18/2024)   Exercise Vital Sign    Days of Exercise per Week: 3 days    Minutes of Exercise per Session: 20 min  Stress: No Stress Concern Present (03/18/2024)   [employer] of Occupational Health - Occupational Stress Questionnaire    Feeling of Stress : Not at all  Social Connections: Socially Integrated (03/18/2024)   Social Connection and Isolation Panel    Frequency of Communication with Friends and  Family: More than three times a week    Frequency of Social Gatherings with Friends and Family: More than three times a week    Attends Religious Services: More than 4 times per year    Active Member of [employer] or Organizations: Yes    Attends Engineer, structural: More than 4 times per year    Marital Status: Married    MEDICATIONS:  Current Outpatient Medications  Medication Sig Dispense Refill   amLODipine  (NORVASC ) 10 MG tablet Take 1 tablet (10 mg total) by mouth daily. 90 tablet 0  aspirin  EC 81 MG tablet Take 1 tablet (81 mg total) by mouth daily. Swallow whole. 90 tablet 3   calcium  carbonate (OSCAL) 1500 (600 Ca) MG TABS tablet Take 1 tablet by mouth 2 (two) times daily with a meal.     cloNIDine  (CATAPRES ) 0.1 MG tablet Take 1 tablet (0.1 mg total) by mouth 2 (two) times daily. 60 tablet 1   Continuous Glucose Sensor (DEXCOM G7 SENSOR) MISC 1 Device by Does not apply route continuous. 9 each 3   dapagliflozin propanediol (FARXIGA) 10 MG TABS tablet Take by mouth daily.     ezetimibe  (ZETIA ) 10 MG tablet Take 1 tablet (10 mg total) by mouth daily. Take 1 tablet by mouth once daily 90 tablet 3   Ferrous Sulfate (IRON PO) Take by mouth.     glucose blood test strip Use as instructed to test blood sugar 3 times daily E11.65 270 each 1   insulin  aspart (NOVOLOG  FLEXPEN) 100 UNIT/ML FlexPen 8 UNITS BEFORE Breakfast on exercise days and if eating eggs 10 Units on other days LUNCH COVERAGE: NOVOLOG  8 UNITS if eating at home and 10 units for fast food Dinner 8-10 units Novolog  before eating 15 mL 3   insulin  glargine-yfgn (SEMGLEE , YFGN,) 100 UNIT/ML Pen INJECT 16 UNITS SUBCUTANEOUSLY ONCE DAILY 15 mL 4   losartan  (COZAAR ) 100 MG tablet Take 1 tablet by mouth once daily 90 tablet 1   meloxicam  (MOBIC ) 7.5 MG tablet Take 1 tablet (7.5 mg total) by mouth daily. 30 tablet 0   metoprolol  tartrate (LOPRESSOR ) 25 MG tablet Take 1 tablet by mouth twice daily 180 tablet 0   pioglitazone   (ACTOS ) 15 MG tablet Take 1 tablet (15 mg total) by mouth daily. 30 tablet 5   rosuvastatin  (CRESTOR ) 20 MG tablet Take 1 tablet (20 mg total) by mouth daily. 90 tablet 1   Semaglutide ,0.25 or 0.5MG /DOS, 2 MG/3ML SOPN Inject 0.5 mg into the skin once a week. 9 mL 3   sodium bicarbonate  325 MG tablet Take 325 mg by mouth daily.     No current facility-administered medications for this visit.    PHYSICAL EXAM: Vitals:   05/28/24 0915 05/28/24 0916  BP: (!) 200/80 (!) 180/70  Pulse: 79   Resp: 16   SpO2: 99%   Weight: 91 lb 3.2 oz (41.4 kg)   Height: 4' 9 (1.448 m)       Body mass index is 19.74 kg/m.  Wt Readings from Last 3 Encounters:  05/28/24 91 lb 3.2 oz (41.4 kg)  03/18/24 92 lb 6.4 oz (41.9 kg)  02/27/24 91 lb 1.6 oz (41.3 kg)    General: Well developed, well nourished female in no apparent distress.  HEENT: AT/[place], no external lesions.  Eyes: Conjunctiva clear and no icterus. Neck: Neck supple  Lungs: Respirations not labored Neurologic: Alert, oriented, normal speech Extremities / Skin: Dry.  Psychiatric: Does not appear depressed or anxious  Diabetic Foot Exam - Simple   Simple Foot Form Diabetic Foot exam was performed with the following findings: Yes 05/28/2024  9:38 AM  Visual Inspection No deformities, no ulcerations, no other skin breakdown bilaterally: Yes Sensation Testing Intact to touch and monofilament testing bilaterally: Yes Pulse Check Posterior Tibialis and Dorsalis pulse intact bilaterally: Yes Comments    LABS Reviewed Lab Results  Component Value Date   HGBA1C 7.5 (A) 05/28/2024   HGBA1C 9.3 (A) 02/11/2024   HGBA1C 8.8 (H) 10/31/2023   Lab Results  Component Value Date   FRUCTOSAMINE  353 (H) 07/29/2023   FRUCTOSAMINE 346 (H) 05/04/2022   FRUCTOSAMINE 337 (H) 01/23/2022   Lab Results  Component Value Date   CHOL 146 03/18/2024   HDL 59.80 03/18/2024   LDLCALC 65 03/18/2024   LDLDIRECT 33.0 01/23/2022   TRIG 108.0 03/18/2024    CHOLHDL 2 03/18/2024   No results found for: [facility]  Lab Results  Component Value Date   CREATININE 2.3 (A) 01/01/2024   Lab Results  Component Value Date   GFR 36.27 (L) 07/29/2023    ASSESSMENT / PLAN  1. Type 2 diabetes mellitus with other specified complication, with long-term current use of insulin  (HCC)     Diabetes Mellitus type 2, complicated by diabetic neuropathy and nephropathy. - Diabetic status / severity: Uncontrolled.  Lab Results  Component Value Date   HGBA1C 7.5 (A) 05/28/2024    - Hemoglobin A1c goal : <6.5%  Diabetes control is improving.  Discussed about portion control and limiting carbohydrate in the meals.  Discussed about avoiding frequently snack and bedtime snack.  Discussed about taking insulin  before eating.  Discussed about compliance with NovoLog  with all the meals.  - Medications: See below.  Continue Semeglee long acting 16 units daily. Increase NovoLog  from 10 to 12 units with breakfast and stay on 10 units with lunch and increased from 10-12 units with dinner. Ozempic  0.5 mg weekly. Farxiga 10 mg daily.  Actos  15 mg daily.   - Home glucose testing: CGM Dexcom and check as needed. - Discussed/ Gave Hypoglycemia treatment plan.  # Consult : not required at this time.   # Annual urine for microalbuminuria/ creatinine ratio, no microalbuminuria currently, continue ACE/ARB/losartan .  She has CKD following with nephrology.  Will check urine microalbumin creatinine ratio today. Last  No results found for: MICRALBCREAT   # Foot check nightly / neuropathy, continue gabapentin .  # Annual dilated diabetic eye exams.   - Diet: Make healthy diabetic food choices - Life style / activity / exercise: Discussed.  2. Blood pressure  -  BP Readings from Last 1 Encounters:  05/28/24 (!) 180/70    - Control is not  in target. Patient denies headache or chest pain, asymptomatic.  Patient reports blood pressure is normal at home.   Patient advised to monitor blood pressure at home and if remains high asked to call primary care provider. - No change in current plans.    3. Lipid status / Hyperlipidemia - Last  Lab Results  Component Value Date   LDLCALC 65 03/18/2024   - Continue rosuvastatin  20 mg daily.  Zetia  10 mg daily.  Diagnoses and all orders for this visit:  Type 2 diabetes mellitus with other specified complication, with long-term current use of insulin  (HCC) -     POCT glycosylated hemoglobin (Hb A1C) -     Microalbumin / creatinine urine ratio   DISPOSITION Follow up in clinic in 3 months suggested.    All questions answered and patient verbalized understanding of the plan.  Iraq Man Effertz, MD [facility] Endocrinology [facility] [address] [place], [address] [place], [place] [address] Phone # [phone]  At least part of this note was generated using voice recognition software. Inadvertent word errors may have occurred, which were not recognized during the proofreading process.

## 2024-05-29 LAB — MICROALBUMIN / CREATININE URINE RATIO
Creatinine, Urine: 62 mg/dL (ref 20–275)
Microalb Creat Ratio: 1695 mg/g{creat} — ABNORMAL HIGH (ref <30)
Microalb, Ur: 105.1 mg/dL

## 2024-06-20 ENCOUNTER — Other Ambulatory Visit: Payer: Self-pay | Admitting: Internal Medicine

## 2024-06-20 DIAGNOSIS — I10 Essential (primary) hypertension: Secondary | ICD-10-CM

## 2024-06-24 ENCOUNTER — Ambulatory Visit: Admitting: Internal Medicine

## 2024-06-24 ENCOUNTER — Encounter: Payer: Self-pay | Admitting: Internal Medicine

## 2024-06-24 VITALS — BP 160/70 | HR 70 | Temp 98.4°F | Wt 95.7 lb

## 2024-06-24 DIAGNOSIS — I2510 Atherosclerotic heart disease of native coronary artery without angina pectoris: Secondary | ICD-10-CM

## 2024-06-24 DIAGNOSIS — I10 Essential (primary) hypertension: Secondary | ICD-10-CM

## 2024-06-24 DIAGNOSIS — Z794 Long term (current) use of insulin: Secondary | ICD-10-CM

## 2024-06-24 DIAGNOSIS — E1122 Type 2 diabetes mellitus with diabetic chronic kidney disease: Secondary | ICD-10-CM

## 2024-06-24 DIAGNOSIS — E782 Mixed hyperlipidemia: Secondary | ICD-10-CM | POA: Diagnosis not present

## 2024-06-24 DIAGNOSIS — N1832 Chronic kidney disease, stage 3b: Secondary | ICD-10-CM

## 2024-06-24 LAB — POCT GLYCOSYLATED HEMOGLOBIN (HGB A1C): Hemoglobin A1C: 7.4 % — AB (ref 4.0–5.6)

## 2024-06-24 MED ORDER — METOPROLOL TARTRATE 25 MG PO TABS: 25.0000 mg | ORAL_TABLET | Freq: Two times a day (BID) | ORAL | 1 refills | Status: DC

## 2024-06-24 MED ORDER — METOPROLOL TARTRATE 50 MG PO TABS: 50.0000 mg | ORAL_TABLET | Freq: Two times a day (BID) | ORAL | 1 refills | Status: AC

## 2024-06-24 NOTE — Assessment & Plan Note (Signed)
 Most recent A1c is 7.4, she is insulin -dependent and followed by endocrinology.

## 2024-06-24 NOTE — Assessment & Plan Note (Signed)
 Last LDL was at goal at 65 as of March 2025.  Continue rosuvastatin .

## 2024-06-24 NOTE — Assessment & Plan Note (Signed)
 Not well-controlled on amlodipine  10 mg, clonidine  0.1 mg twice daily, losartan  100 mg daily and Lopressor  25 mg twice daily.  Increase Lopressor  to 50 mg twice daily, follow-up for recheck.

## 2024-06-24 NOTE — Assessment & Plan Note (Signed)
 Stable, no chest pain, followed by cardiology.

## 2024-06-24 NOTE — Progress Notes (Signed)
 Established Patient Office Visit     CC/Reason for Visit: Follow-up chronic conditions  HPI: Mariah Peterson is a 78 y.o. female who is coming in today for the above mentioned reasons. Past Medical History is significant for: Hypertension, hyperlipidemia, history of coronary artery disease, insulin -dependent diabetes, stage IIIb chronic kidney disease.  She has no acute concerns or complaints.  Is feeling well.   Past Medical/Surgical History: Past Medical History:  Diagnosis Date   Chest pain 11/30/2020   Coronary artery disease    Diabetes mellitus without complication (HCC)    Glaucoma    History of chicken pox    Hypertension     Past Surgical History:  Procedure Laterality Date   BREAST BIOPSY Right 2017   benign   CARDIAC CATHETERIZATION     CATARACT EXTRACTION, BILATERAL Bilateral    CESAREAN SECTION     COLONOSCOPY  1980's   CORONARY ARTERY BYPASS GRAFT N/A 12/05/2020   Procedure: CORONARY ARTERY BYPASS GRAFTING (CABG), ON PUMP, TIMES FOUR, USING LEFT INTERNAL MAMMARY ARTERY AND BILATERAL ENDOSCOPICALLY HARVESTED GREATER SAPHENOUS VEINS;  Surgeon: Shyrl Linnie KIDD, MD;  Location: [facility] OR;  Service: Open Heart Surgery;  Laterality: N/A;   LEFT HEART CATH AND CORONARY ANGIOGRAPHY N/A 11/30/2020   Procedure: LEFT HEART CATH AND CORONARY ANGIOGRAPHY;  Surgeon: Claudene Victory ORN, MD;  Location: [facility] INVASIVE CV LAB;  Service: Cardiovascular;  Laterality: N/A;   TEE WITHOUT CARDIOVERSION N/A 12/05/2020   Procedure: TRANSESOPHAGEAL ECHOCARDIOGRAM (TEE);  Surgeon: Shyrl Linnie KIDD, MD;  Location: [facility] OR;  Service: Open Heart Surgery;  Laterality: N/A;   TUBAL LIGATION      Social History:  reports that she has never smoked. She has never used smokeless tobacco. She reports that she does not drink alcohol and does not use drugs.  Allergies: No Known Allergies  Family History:  Family History  Problem Relation Age of Onset   Hypertension Mother    Diabetes Brother     CAD Brother    Hypertension Brother    Diabetes Brother    Hypertension Brother    Colon cancer Neg Hx    Esophageal cancer Neg Hx    Pancreatic cancer Neg Hx    Liver disease Neg Hx    Stomach cancer Neg Hx    Rectal cancer Neg Hx    Colon polyps Neg Hx    Breast cancer Neg Hx    BRCA 1/2 Neg Hx      Current Outpatient Medications:    amLODipine  (NORVASC ) 10 MG tablet, Take 1 tablet (10 mg total) by mouth daily., Disp: 90 tablet, Rfl: 0   aspirin  EC 81 MG tablet, Take 1 tablet (81 mg total) by mouth daily. Swallow whole., Disp: 90 tablet, Rfl: 3   calcium  carbonate (OSCAL) 1500 (600 Ca) MG TABS tablet, Take 1 tablet by mouth 2 (two) times daily with a meal., Disp: , Rfl:    cloNIDine  (CATAPRES ) 0.1 MG tablet, Take 1 tablet (0.1 mg total) by mouth 2 (two) times daily., Disp: 60 tablet, Rfl: 1   Continuous Glucose Sensor (DEXCOM G7 SENSOR) MISC, 1 Device by Does not apply route continuous., Disp: 9 each, Rfl: 3   dapagliflozin propanediol (FARXIGA) 10 MG TABS tablet, Take by mouth daily., Disp: , Rfl:    ezetimibe  (ZETIA ) 10 MG tablet, Take 1 tablet (10 mg total) by mouth daily. Take 1 tablet by mouth once daily, Disp: 90 tablet, Rfl: 3   Ferrous Sulfate (IRON PO), Take  by mouth., Disp: , Rfl:    glucose blood test strip, Use as instructed to test blood sugar 3 times daily E11.65, Disp: 270 each, Rfl: 1   insulin  aspart (NOVOLOG  FLEXPEN) 100 UNIT/ML FlexPen, 8 UNITS BEFORE Breakfast on exercise days and if eating eggs 10 Units on other days LUNCH COVERAGE: NOVOLOG  8 UNITS if eating at home and 10 units for fast food Dinner 8-10 units Novolog  before eating, Disp: 15 mL, Rfl: 3   insulin  glargine-yfgn (SEMGLEE , YFGN,) 100 UNIT/ML Pen, INJECT 16 UNITS SUBCUTANEOUSLY ONCE DAILY, Disp: 15 mL, Rfl: 4   losartan  (COZAAR ) 100 MG tablet, Take 1 tablet by mouth once daily, Disp: 90 tablet, Rfl: 1   meloxicam  (MOBIC ) 7.5 MG tablet, Take 1 tablet (7.5 mg total) by mouth daily., Disp: 30 tablet, Rfl:  0   pioglitazone  (ACTOS ) 15 MG tablet, Take 1 tablet (15 mg total) by mouth daily., Disp: 30 tablet, Rfl: 5   rosuvastatin  (CRESTOR ) 20 MG tablet, Take 1 tablet (20 mg total) by mouth daily., Disp: 90 tablet, Rfl: 1   Semaglutide ,0.25 or 0.5MG /DOS, 2 MG/3ML SOPN, Inject 0.5 mg into the skin once a week., Disp: 9 mL, Rfl: 3   sodium bicarbonate  325 MG tablet, Take 325 mg by mouth daily., Disp: , Rfl:    metoprolol  tartrate (LOPRESSOR ) 50 MG tablet, Take 1 tablet (50 mg total) by mouth 2 (two) times daily., Disp: 180 tablet, Rfl: 1  Review of Systems:  Negative unless indicated in HPI.   Physical Exam: Vitals:   06/24/24 0913 06/24/24 0918  BP: (!) 160/70 (!) 160/70  Pulse: 70   Temp: 98.4 F (36.9 C)   TempSrc: Oral   SpO2: 98%   Weight: 95 lb 11.2 oz (43.4 kg)     Body mass index is 20.71 kg/m.   Physical Exam Vitals reviewed.  Constitutional:      Appearance: Normal appearance.  HENT:     Head: Normocephalic and atraumatic.  Eyes:     Conjunctiva/sclera: Conjunctivae normal.  Cardiovascular:     Rate and Rhythm: Normal rate and regular rhythm.  Pulmonary:     Effort: Pulmonary effort is normal.     Breath sounds: Normal breath sounds.  Skin:    General: Skin is warm and dry.  Neurological:     General: No focal deficit present.     Mental Status: She is alert and oriented to person, place, and time.  Psychiatric:        Mood and Affect: Mood normal.        Behavior: Behavior normal.        Thought Content: Thought content normal.        Judgment: Judgment normal.      Impression and Plan:  Type 2 diabetes mellitus with stage 3b chronic kidney disease, with long-term current use of insulin  (HCC) Assessment & Plan: Most recent A1c is 7.4, she is insulin -dependent and followed by endocrinology.  Orders: -     POCT glycosylated hemoglobin (Hb A1C)  Primary hypertension Assessment & Plan: Not well-controlled on amlodipine  10 mg, clonidine  0.1 mg twice  daily, losartan  100 mg daily and Lopressor  25 mg twice daily.  Increase Lopressor  to 50 mg twice daily, follow-up for recheck.  Orders: -     Metoprolol  Tartrate; Take 1 tablet (50 mg total) by mouth 2 (two) times daily.  Dispense: 180 tablet; Refill: 1  Mixed hyperlipidemia Assessment & Plan: Last LDL was at goal at 65 as of March 2025.  Continue rosuvastatin .   Coronary artery disease involving native coronary artery of native heart without angina pectoris Assessment & Plan: Stable, no chest pain, followed by cardiology.      Time spent:33 minutes reviewing chart, interviewing and examining patient and formulating plan of care.     Tully Theophilus Andrews, MD [facility]

## 2024-07-07 ENCOUNTER — Telehealth: Payer: Self-pay | Admitting: Pharmacy Technician

## 2024-07-07 NOTE — Progress Notes (Signed)
 07/07/2024 Name: Mariah Peterson MRN: [id number] DOB: December 25, 1945  Patient is appearing on a report for [employer] [place] [employer] Diabetes and last engaged with the clinical pharmacist to discuss diabetes on 04/17/2024. Contacted patient today to discuss diabetes management and completed medication review.   Diabetes Plan from last clinical pharmacist appointment:  Diabetes: - Currently uncontrolled - Reviewed long term cardiovascular and renal outcomes of uncontrolled blood sugar - Reviewed goal A1c, goal fasting, and goal 2 hour post prandial glucose - Reviewed dietary modifications including low carb diet - Recommend to continue current medication therapy and keep follow up with endo. If A1C still elevated at next follow up, will likely need insulin  adjustment  - Patient denies personal or family history of multiple endocrine neoplasia type 2, medullary thyroid  cancer; personal history of pancreatitis or gallbladder disease Hypertension: - Currently uncontrolled - Reviewed long term cardiovascular and renal outcomes of uncontrolled blood pressure - Reviewed appropriate blood pressure monitoring technique and reviewed goal blood pressure. Recommended to check home blood pressure and heart rate daily - Recommend to continue current medication therapy, had not taken medication at time of last BP check. If remains elevated, may need to increase amlodipine  and re-confirm patient is taking her losartan   Follow Up Plan: 2 months   Medication Adherence Barriers Identified:  Patient is followed by Dr. [place] Thapa at [facility]. Last appointment 06/24/24. Next appointment schedueld for 08/31/2024. The following recommendation were made at the 06/25/2024 appointment. - Medications: Continue Semeglee long acting 16 units daily. Increase NovoLog  from 10 to 12 units with breakfast and stay on 10 units with lunch and increased from 10-12 units with dinner.Ozempic  0.5 mg weekly. Farxiga 10 mg daily.. Actos   15 mg daily. Home glucose testing: CGM Dexcom and check as needed. A1C on 06/24/24 was 7.4. Patient made recommended medication changes per plan: Yes Spoke to patient and she informs she is taking Farxiga 10mg  daily, Long acting insulin  at 16 units daily, Ozempic  at 0.5mg  weekly, Actos  15mg  daily and Short acting insulin  at 12 units 3 times a day taking differently than prescribed in note).. Access issues with any new medication or testing device: No Patient informs she has these medications on hand as we were going over them but dispense history from Dr Annemarie shows patient should be out of or need refills on some of these medications. Per Dr Annemarie, NO fill history available for Farxiga and Ozempic . Unsure if patient is receiving these from patient assistance programs. Long acting insulin  (Lantus ) last filled on 03/24/24 for 15ml, Short acting insulin  (Novolog ) last filled on 01/24/24 for 15ml and Actos  on 05/21/2024 for 30 tablets. Patient is checking blood sugars as prescribed: Yes Patient informs she is using Dexcom CGM to check blood sugar. She was unable to pull up any previous data. She did inform blood sugar was 160 today after breakfast. Patient informs she is checking her blood pressure though it is unclear how often. She informs it is always high at provider's offices and she is not sure why. She provides the following blood pressure readings: 06/25/2024 in the evening it was 158/98, on 06/30/24 at 7:56am it was 81/47 and on 07/01/2024 at 8:21am it was 145/96. Patient endorses to taking the following medications for her blood pressure: Losartan  100mg  daily, Amlodipine  10mg  daily, Metoprolol  50mg  twice a day and Clonidine  0.1mg  twice a day. Some of these medications appear due for refills but patient informs she has these medications on hand and is looking at  the bottles as we go over them but according to Dr Annemarie data she should be out of some of them. Per Dr Boykin, there is NO fill hisotry for 2025 for  Losartan . Clonidine  last filled on 8/7 for 30 day supply, Amlodipine  last filled for 90 day supply on 7/2 and Mtoprolol last filled for 90 day supply on 9/3.  Medication Adherence Barriers Addressed/Actions Taken:  Reviewed medication changes per plan from last clinical pharmacist note Medication Access for Patient may need refills sent in for Losartan . Will discuss medication access concerns with pharmacist Educated patient to contact pharmacy regarding new prescriptions/refills Patient may benefit for 90 day supply on blood pressure medications. Reviewed instructions for monitoring blood sugars at home and reminded patient to keep a written log to review with pharmacist Reminded patient of date/time of upcoming clinical pharmacist follow up and any upcoming PCP/specialists visits. Patient denies transportation barriers to the appointment. Yes  Next clinical pharmacist appointment is scheduled for: TBD   Kate Caddy, CPhT [facility] Office: [phone] Email: [email or web address]

## 2024-07-09 ENCOUNTER — Telehealth: Payer: Self-pay | Admitting: *Deleted

## 2024-07-09 ENCOUNTER — Other Ambulatory Visit: Payer: Self-pay | Admitting: Endocrinology

## 2024-07-09 ENCOUNTER — Telehealth: Payer: Self-pay

## 2024-07-09 DIAGNOSIS — E1165 Type 2 diabetes mellitus with hyperglycemia: Secondary | ICD-10-CM

## 2024-07-09 DIAGNOSIS — I10 Essential (primary) hypertension: Secondary | ICD-10-CM

## 2024-07-09 MED ORDER — LOSARTAN POTASSIUM 100 MG PO TABS: 100.0000 mg | ORAL_TABLET | Freq: Every day | ORAL | 1 refills | Status: AC

## 2024-07-09 NOTE — Progress Notes (Signed)
   07/09/2024  Patient ID: Mariah Peterson, female   DOB: April 02, 1946, 78 y.o.   MRN: [id number]  Contacted patient to follow up on medication reviews. Confirms she has and is taking all medications listed on her profile.  Was able to confirm she receives Ozempic  and Farxiga via PAP.  Setup a phone call for BP and DM f/u in 3 weeks.  Jon VEAR Lindau, PharmD Clinical Pharmacist [phone]

## 2024-07-09 NOTE — Telephone Encounter (Signed)
-----   Message from Jon VEAR Lindau sent at 07/09/2024  1:24 PM EDT ----- Hello,  Patient requesting refill on the following prescription:  Losartan  100mg  1 tablet daily  Pharmacy Info: [id number] - [place], [place] - [address] [place] P: [phone] F: [phone]  Thank you! Jon VEAR Lindau, PharmD Clinical Pharmacist [phone]

## 2024-07-09 NOTE — Telephone Encounter (Signed)
 Refill sent.

## 2024-07-09 NOTE — Telephone Encounter (Signed)
 Refill request complete

## 2024-07-29 ENCOUNTER — Other Ambulatory Visit (INDEPENDENT_AMBULATORY_CARE_PROVIDER_SITE_OTHER)

## 2024-07-29 DIAGNOSIS — Z794 Long term (current) use of insulin: Secondary | ICD-10-CM

## 2024-07-29 DIAGNOSIS — I10 Essential (primary) hypertension: Secondary | ICD-10-CM

## 2024-07-29 DIAGNOSIS — E782 Mixed hyperlipidemia: Secondary | ICD-10-CM

## 2024-07-29 NOTE — Progress Notes (Signed)
 07/29/2024 Name: Mariah Peterson MRN: [id number] DOB: [birth year]  Chief Complaint  Patient presents with   Medication Management   Diabetes   Hypertension   Hyperlipidemia    Mariah Peterson is a 78 y.o. year old female who presented for a telephone visit.   They were referred to the pharmacist by a quality report for assistance in managing diabetes.    Subjective:  Care Team: Primary Care Provider: Theophilus Andrews, Tully GRADE, MD   Medication Access/Adherence  Current Pharmacy:  [facility] [address] [place] - [address] [place] [address] Phone: [phone] Fax: [phone]  [facility] - [place], [place] - [address] [place] [address] Phone: [phone] Fax: [phone]   Patient reports affordability concerns with their medications: No  Patient reports access/transportation concerns to their pharmacy: No  Patient reports adherence concerns with their medications:  No     Diabetes:  Current medications: Farxiga 10mg  daily, Ozempic  0.5mg  on Thursdays, pioglitazone  15mg  daily, Semglee  16 units daily, Novolog  10 units with meals Medications tried in the past: Jardiance  (cost), Metformin  (kidney)  Current glucose readings:  Using Dexcom G7 sensors, unable to pair with office Is monitored by Dr. Mercie at endo Denies any low BG readings below 70. Reporting controlled readings at home  Observed patterns:  Patient denies hypoglycemic s/sx including dizziness, shakiness, sweating. Patient denies hyperglycemic symptoms including polyuria, polydipsia, polyphagia, nocturia, neuropathy, blurred vision.   Current medication access support: Farxiga through [place], Ozempic  through NOVO  Hypertension:  Current medications: Losartan  100mg , Amlodipine  5mg  daily, Metoprolol  50mg  BID, Clonidine  0.1mg  BID   Patient does have a validated, automated, upper arm home BP  cuff Current blood pressure readings readings: checking about once a week, reports yesterday was 95/56 but last week was 179/107, she is not sure if this was after meds  Patient denies hypotensive s/sx including dizziness, lightheadedness.  Patient denies hypertensive symptoms including headache, chest pain, shortness of breath  Hyperlipidemia/ASCVD Risk Reduction  Current lipid lowering medications: Crestor  20mg , Zetia  10mg  (has not filled zetia  since 01/29/24 and reports she does not have at home) Medications tried in the past: None  Antiplatelet regimen: Aspirin  81mg    Objective:  Lab Results  Component Value Date   HGBA1C 7.4 (A) 06/24/2024    Lab Results  Component Value Date   CREATININE 2.3 (A) 01/01/2024   BUN 33 (A) 01/01/2024   NA 136 (A) 01/01/2024   K 5.0 01/01/2024   CL 108 01/01/2024   CO2 21 01/01/2024    Lab Results  Component Value Date   CHOL 146 03/18/2024   HDL 59.80 03/18/2024   LDLCALC 65 03/18/2024   LDLDIRECT 33.0 01/23/2022   TRIG 108.0 03/18/2024   CHOLHDL 2 03/18/2024    Medications Reviewed Today     Reviewed by Lionell Jon DEL, RPH (Pharmacist) on 07/29/24 at 1954  Med List Status: <None>   Medication Order Taking? Sig Documenting Provider Last Dose Status Informant  amLODipine  (NORVASC ) 10 MG tablet [id number] Yes Take 1 tablet (10 mg total) by mouth daily. Von Pacific, MD  Active   aspirin  EC 81 MG tablet [id number] Yes Take 1 tablet (81 mg total) by mouth daily. Swallow whole. Jeffrie Oneil BROCKS, MD  Active   calcium  carbonate (OSCAL) 1500 (600 Ca) MG TABS tablet [id number] Yes Take 1 tablet by mouth 2 (two) times daily with a meal. [provider]  Active Self  cloNIDine  (CATAPRES ) 0.1 MG tablet [id number] Yes Take 1 tablet by mouth twice daily Thapa, [place], MD  Active   Continuous Glucose Sensor (DEXCOM G7 SENSOR) MISC [id number] Yes 1 Device by Does not apply route continuous. Thapa, [place], MD  Active   dapagliflozin propanediol  (FARXIGA) 10 MG TABS tablet [id number] Yes Take by mouth daily. [provider]  Active   ezetimibe  (ZETIA ) 10 MG tablet [id number]  Take 1 tablet (10 mg total) by mouth daily. Take 1 tablet by mouth once daily Thapa, [place], MD  Active   Ferrous Sulfate (IRON PO) [id number] Yes Take by mouth. [provider]  Active   glucose blood test strip [id number]  Use as instructed to test blood sugar 3 times daily E11.65 Von Pacific, MD  Active Self           Med Note ELSWORTH, JACQUELINE L   Wed Sep 25, 2023  9:25 AM)    insulin  aspart (NOVOLOG  FLEXPEN) 100 UNIT/ML FlexPen [id number] Yes 8 UNITS BEFORE Breakfast on exercise days and if eating eggs 10 Units on other days LUNCH COVERAGE: NOVOLOG  8 UNITS if eating at home and 10 units for fast food Dinner 8-10 units Novolog  before eating Thapa, [place], MD  Active            Med Note [name], [name]   Tue Dec 03, 2023 10:27 AM) 10 units before each meal  insulin  glargine-yfgn (SEMGLEE , YFGN,) 100 UNIT/ML Pen [id number] Yes INJECT 16 UNITS SUBCUTANEOUSLY ONCE DAILY Thapa, [place], MD  Active   losartan  (COZAAR ) 100 MG tablet [id number] Yes Take 1 tablet (100 mg total) by mouth daily. Theophilus Andrews, Tully GRADE, MD  Active   meloxicam  (MOBIC ) 7.5 MG tablet [id number]  Take 1 tablet (7.5 mg total) by mouth daily. Theophilus Andrews, Tully GRADE, MD  Active   metoprolol  tartrate (LOPRESSOR ) 50 MG tablet [id number] Yes Take 1 tablet (50 mg total) by mouth 2 (two) times daily. Theophilus Andrews, Tully GRADE, MD  Active   pioglitazone  (ACTOS ) 15 MG tablet [id number] Yes Take 1 tablet (15 mg total) by mouth daily. Thapa, [place], MD  Active   rosuvastatin  (CRESTOR ) 20 MG tablet [id number] Yes Take 1 tablet (20 mg total) by mouth daily. Thapa, [place], MD  Active   Semaglutide ,0.25 or 0.5MG /DOS, 2 MG/3ML SOPN [id number] Yes Inject 0.5 mg into the skin once a week. Thapa, [place], MD  Active   sodium bicarbonate  325 MG tablet [id number] Yes Take 325 mg by mouth daily. [provider]  Active               Assessment/Plan:   Diabetes: - Currently uncontrolled but improving - Reviewed long term cardiovascular and renal outcomes of uncontrolled blood sugar - Reviewed goal A1c, goal fasting, and goal 2 hour post prandial glucose - Reviewed dietary modifications including low carb diet - Recommend to continue current medication therapy and keep follow up with endo. If A1C still elevated at next follow up, will likely need insulin  adjustment  - Patient denies personal or family history of multiple endocrine neoplasia type 2, medullary thyroid  cancer; personal history of pancreatitis or gallbladder disease   Hypertension: - Currently uncontrolled - Reviewed long term cardiovascular and renal outcomes of uncontrolled blood pressure - Reviewed appropriate blood pressure monitoring technique and reviewed goal blood pressure. Recommended to check home blood pressure and heart rate daily - Recommend to continue current medication therapy    Follow Up Plan: 2 weeks for BP f/u  Jon DEL  Lionell, PharmD Clinical Pharmacist [phone]

## 2024-08-10 ENCOUNTER — Other Ambulatory Visit (INDEPENDENT_AMBULATORY_CARE_PROVIDER_SITE_OTHER)

## 2024-08-10 DIAGNOSIS — I10 Essential (primary) hypertension: Secondary | ICD-10-CM

## 2024-08-10 DIAGNOSIS — N1832 Chronic kidney disease, stage 3b: Secondary | ICD-10-CM

## 2024-08-10 MED ORDER — CLONIDINE HCL 0.2 MG PO TABS: 0.2000 mg | ORAL_TABLET | Freq: Two times a day (BID) | ORAL | 1 refills | Status: AC

## 2024-08-10 NOTE — Progress Notes (Signed)
 08/10/2024 Name: Mariah Peterson MRN: [id number] DOB: [birth year]  Chief Complaint  Patient presents with   Medication Management   Diabetes   Hypertension    Mariah Peterson is a 78 y.o. year old female who presented for a telephone visit.   They were referred to the pharmacist by a quality report for assistance in managing diabetes.    Subjective:  Care Team: Primary Care Provider: Theophilus Andrews, Tully GRADE, MD   Medication Access/Adherence  Current Pharmacy:  [facility] [address], [place] - [address] [place] [address] Phone: [phone] Fax: [phone]  [facility] - [place], [place] - [address] [place] [address] Phone: [phone] Fax: [phone]   Patient reports affordability concerns with their medications: No  Patient reports access/transportation concerns to their pharmacy: No  Patient reports adherence concerns with their medications:  No     Diabetes:  Current medications: Farxiga 10mg  daily, Ozempic  0.5mg  on Thursdays, pioglitazone  15mg  daily, Semglee  16 units daily, Novolog  10 units with meals Medications tried in the past: Jardiance  (cost), Metformin  (kidney)  Current glucose readings:  Using Dexcom G7 sensors, unable to pair with office Is monitored by Dr. Mercie at endo Denies any low BG readings below 70. Reporting controlled readings at home  Observed patterns:  Patient denies hypoglycemic s/sx including dizziness, shakiness, sweating. Patient denies hyperglycemic symptoms including polyuria, polydipsia, polyphagia, nocturia, neuropathy, blurred vision.   Current medication access support: Farxiga through [place]&Me, Ozempic  through NOVO  Hypertension:  Current medications: Losartan  100mg , Amlodipine  5mg  daily, Metoprolol  50mg  BID, Clonidine  0.1mg  BID   Patient does have a validated, automated, upper arm home BP cuff Current blood  pressure readings readings: Checking daily, SBP readings staying above 140, with most in the upper 140s-160s. DBP in 80s-107.  Reports adherence to meds  Patient denies hypotensive s/sx including dizziness, lightheadedness.  Patient denies hypertensive symptoms including headache, chest pain, shortness of breath  Hyperlipidemia/ASCVD Risk Reduction  Current lipid lowering medications: Crestor  20mg , Zetia  10mg   Medications tried in the past: None  Antiplatelet regimen: Aspirin  81mg    Objective:  Lab Results  Component Value Date   HGBA1C 7.4 (A) 06/24/2024    Lab Results  Component Value Date   CREATININE 2.3 (A) 01/01/2024   BUN 33 (A) 01/01/2024   NA 136 (A) 01/01/2024   K 5.0 01/01/2024   CL 108 01/01/2024   CO2 21 01/01/2024    Lab Results  Component Value Date   CHOL 146 03/18/2024   HDL 59.80 03/18/2024   LDLCALC 65 03/18/2024   LDLDIRECT 33.0 01/23/2022   TRIG 108.0 03/18/2024   CHOLHDL 2 03/18/2024    Medications Reviewed Today     Reviewed by Lionell Jon DEL, RPH (Pharmacist) on 08/10/24 at 1420  Med List Status: <None>   Medication Order Taking? Sig Documenting Provider Last Dose Status Informant  amLODipine  (NORVASC ) 10 MG tablet [id number]  Take 1 tablet (10 mg total) by mouth daily. Von Pacific, MD  Active   aspirin  EC 81 MG tablet [id number]  Take 1 tablet (81 mg total) by mouth daily. Swallow whole. Jeffrie Oneil BROCKS, MD  Active   calcium  carbonate (OSCAL) 1500 (600 Ca) MG TABS tablet [id number]  Take 1 tablet by mouth 2 (two) times daily with a meal. [provider]  Active Self    Discontinued 08/10/24 1416 (Dose change)   Continuous Glucose Sensor (DEXCOM G7 SENSOR) MISC [id number]  1 Device by Does not apply route continuous. Thapa, [place], MD  Active   dapagliflozin propanediol (FARXIGA) 10 MG TABS tablet [id number]  Take by mouth daily. [provider]  Active   ezetimibe  (ZETIA ) 10 MG tablet [id number]  Take 1 tablet (10 mg total) by  mouth daily. Take 1 tablet by mouth once daily Thapa, [place], MD  Active   Ferrous Sulfate (IRON PO) [id number]  Take by mouth. [provider]  Active   glucose blood test strip [id number]  Use as instructed to test blood sugar 3 times daily E11.65 Von Pacific, MD  Active Self           Med Note ELSWORTH, JACQUELINE L   Wed Sep 25, 2023  9:25 AM)    insulin  aspart (NOVOLOG  FLEXPEN) 100 UNIT/ML FlexPen [id number]  8 UNITS BEFORE Breakfast on exercise days and if eating eggs 10 Units on other days LUNCH COVERAGE: NOVOLOG  8 UNITS if eating at home and 10 units for fast food Dinner 8-10 units Novolog  before eating Thapa, [place], MD  Active            Med Note [name], Kella Splinter H   Tue Dec 03, 2023 10:27 AM) 10 units before each meal  insulin  glargine-yfgn (SEMGLEE , YFGN,) 100 UNIT/ML Pen [id number]  INJECT 16 UNITS SUBCUTANEOUSLY ONCE DAILY Thapa, [place], MD  Active   losartan  (COZAAR ) 100 MG tablet [id number]  Take 1 tablet (100 mg total) by mouth daily. Theophilus Andrews, Tully GRADE, MD  Active   meloxicam  (MOBIC ) 7.5 MG tablet [id number]  Take 1 tablet (7.5 mg total) by mouth daily. Theophilus Andrews, Tully GRADE, MD  Active   metoprolol  tartrate (LOPRESSOR ) 50 MG tablet [id number]  Take 1 tablet (50 mg total) by mouth 2 (two) times daily. Theophilus Andrews, Tully GRADE, MD  Active   pioglitazone  (ACTOS ) 15 MG tablet [id number]  Take 1 tablet (15 mg total) by mouth daily. Thapa, [place], MD  Active   rosuvastatin  (CRESTOR ) 20 MG tablet [id number]  Take 1 tablet (20 mg total) by mouth daily. Thapa, [place], MD  Active   Semaglutide ,0.25 or 0.5MG /DOS, 2 MG/3ML SOPN [id number]  Inject 0.5 mg into the skin once a week. Thapa, [place], MD  Active   sodium bicarbonate  325 MG tablet [id number]  Take 325 mg by mouth daily. [provider]  Active               Assessment/Plan:   Diabetes: - Currently uncontrolled but improving - Reviewed long term cardiovascular and renal outcomes of uncontrolled blood  sugar - Reviewed goal A1c, goal fasting, and goal 2 hour post prandial glucose - Reviewed dietary modifications including low carb diet - Recommend to continue current medication therapy and keep follow up with endo. If A1C still elevated at next follow up, will likely need insulin  adjustment  - Patient denies personal or family history of multiple endocrine neoplasia type 2, medullary thyroid  cancer; personal history of pancreatitis or gallbladder disease   Hypertension: - Currently uncontrolled - Reviewed long term cardiovascular and renal outcomes of uncontrolled blood pressure - Reviewed appropriate blood pressure monitoring technique and reviewed goal blood pressure. Recommended to check home blood pressure and heart rate daily - INCREASE clonidine  to 0.2mg  daily.  Hyperlipidemia/ASCVD Risk Reduction: - Currently controlled.  - Reviewed long term complications of uncontrolled cholesterol - Recommend to continue current medication therapy    Follow Up Plan: 6 weeks (sees endo in 4 weeks)  Jon VEAR Lindau, PharmD Clinical Pharmacist [phone]

## 2024-08-11 ENCOUNTER — Other Ambulatory Visit: Payer: Self-pay

## 2024-08-11 DIAGNOSIS — E1169 Type 2 diabetes mellitus with other specified complication: Secondary | ICD-10-CM

## 2024-08-11 MED ORDER — NOVOLOG FLEXPEN 100 UNIT/ML ~~LOC~~ SOPN: PEN_INJECTOR | SUBCUTANEOUS | 3 refills | Status: DC

## 2024-08-19 ENCOUNTER — Other Ambulatory Visit: Payer: Self-pay

## 2024-08-19 DIAGNOSIS — E1169 Type 2 diabetes mellitus with other specified complication: Secondary | ICD-10-CM

## 2024-08-19 MED ORDER — NOVOLOG FLEXPEN 100 UNIT/ML ~~LOC~~ SOPN: PEN_INJECTOR | SUBCUTANEOUS | 3 refills | Status: DC

## 2024-08-28 ENCOUNTER — Encounter: Payer: Self-pay | Admitting: Endocrinology

## 2024-08-28 ENCOUNTER — Ambulatory Visit (INDEPENDENT_AMBULATORY_CARE_PROVIDER_SITE_OTHER): Admitting: Endocrinology

## 2024-08-28 VITALS — BP 174/60 | HR 62 | Resp 20 | Ht <= 58 in | Wt 89.0 lb

## 2024-08-28 DIAGNOSIS — Z794 Long term (current) use of insulin: Secondary | ICD-10-CM | POA: Diagnosis not present

## 2024-08-28 DIAGNOSIS — E1169 Type 2 diabetes mellitus with other specified complication: Secondary | ICD-10-CM | POA: Diagnosis not present

## 2024-08-28 DIAGNOSIS — E1165 Type 2 diabetes mellitus with hyperglycemia: Secondary | ICD-10-CM | POA: Diagnosis not present

## 2024-08-28 MED ORDER — SEMAGLUTIDE(0.25 OR 0.5MG/DOS) 2 MG/3ML ~~LOC~~ SOPN: 0.5000 mg | PEN_INJECTOR | SUBCUTANEOUS | 3 refills | Status: AC

## 2024-08-28 NOTE — Progress Notes (Signed)
 Outpatient Endocrinology Note Fairy Ashlock, MD  08/28/24  Patient's Name: Mariah Peterson    DOB: 1946/09/06    MRN: [id number]                                                    REASON OF VISIT: Follow-up for type 2 diabetes mellitus  PCP: Theophilus Andrews, Tully GRADE, MD  HISTORY OF PRESENT ILLNESS:   Mariah Peterson is a 78 y.o. old female with past medical history listed below, is here for follow-up follow up for type 2 diabetes mellitus.    Pertinent Diabetes History: Patient was diagnosed with type 2 diabetes mellitus around 1990s.  She was started on insulin  therapy around 2020.  Chronic Diabetes Complications : Retinopathy: no. Last ophthalmology exam was done on annually reportedly, last exam in February 2024. Nephropathy: CKD, on losartan , following with nephrology.   Peripheral neuropathy: yes, used to be on gabapentin . Coronary artery disease: yes Stroke: no  Relevant comorbidities and cardiovascular risk factors: Obesity: no Body mass index is 19.26 kg/m.  Hypertension: yes Hyperlipidemia. Yes, on statin and zetia .   Current / Home Diabetic regimen includes: Lantus  / Semeglee 16 units daily in the morning Novolog  10 units with BF, 10 with lunch or supper. Ozempic  0.5 mg weekly.  Farxiga 10 mg daily. Actos  15 mg daily.  Prior diabetic medications: Farxiga and Jardiance  planned in the past, not cost effective.  Glycemic data:   CONTINUOUS GLUCOSE MONITORING SYSTEM (CGMS) INTERPRETATION:           Dexcom G7 CGM-  Sensor Download (Sensor download was reviewed and summarized below.) Dates: October 25 to August 28, 2024  Glucose Management Indicator: 8.3%    Previous:    Previous,    Impression: She has frequent and significant hyperglycemia with breakfast with blood sugar up to 250-300s range.  Most of the other times acceptable blood sugar with mild hyperglycemia with supper and some time with lunch.  Blood sugar in between the meals and overnight  mostly acceptable.  Rare mild hypoglycemia around noon time after breakfast.  No concerning hypoglycemia.  Hypoglycemia: Patient has no hypoglycemic episodes. Patient has hypoglycemia awareness.  Factors modifying glucose control: 1.  Diabetic diet assessment: 3 meals a day.  Bedtime snacks.  2.  Staying active or exercising: No formal exercise  3.  Medication compliance: compliant most of the time.  Interval history Dexcom CGM data as reviewed above.  Diabetes has been at reviewed and noted above.  Hemoglobin A1c was 7.4% in September.  She reports improvement on numbness and tingling of the feet occasionally gets muscle cramp, no longer taking gabapentin .  Denies vision problem.  No other complaints today.  REVIEW OF SYSTEMS As per history of present illness.   PAST MEDICAL HISTORY: Past Medical History:  Diagnosis Date   Chest pain 11/30/2020   Coronary artery disease    Diabetes mellitus without complication (HCC)    Glaucoma    History of chicken pox    Hypertension     PAST SURGICAL HISTORY: Past Surgical History:  Procedure Laterality Date   BREAST BIOPSY Right 2017   benign   CARDIAC CATHETERIZATION     CATARACT EXTRACTION, BILATERAL Bilateral    CESAREAN SECTION     COLONOSCOPY  1980's   CORONARY ARTERY BYPASS GRAFT N/A 12/05/2020  Procedure: CORONARY ARTERY BYPASS GRAFTING (CABG), ON PUMP, TIMES FOUR, USING LEFT INTERNAL MAMMARY ARTERY AND BILATERAL ENDOSCOPICALLY HARVESTED GREATER SAPHENOUS VEINS;  Surgeon: Shyrl Linnie KIDD, MD;  Location: [facility] OR;  Service: Open Heart Surgery;  Laterality: N/A;   LEFT HEART CATH AND CORONARY ANGIOGRAPHY N/A 11/30/2020   Procedure: LEFT HEART CATH AND CORONARY ANGIOGRAPHY;  Surgeon: Claudene Victory ORN, MD;  Location: [facility] INVASIVE CV LAB;  Service: Cardiovascular;  Laterality: N/A;   TEE WITHOUT CARDIOVERSION N/A 12/05/2020   Procedure: TRANSESOPHAGEAL ECHOCARDIOGRAM (TEE);  Surgeon: Shyrl Linnie KIDD, MD;  Location: [facility] OR;  Service:  Open Heart Surgery;  Laterality: N/A;   TUBAL LIGATION      ALLERGIES: No Known Allergies  FAMILY HISTORY:  Family History  Problem Relation Age of Onset   Hypertension Mother    Diabetes Brother    CAD Brother    Hypertension Brother    Diabetes Brother    Hypertension Brother    Colon cancer Neg Hx    Esophageal cancer Neg Hx    Pancreatic cancer Neg Hx    Liver disease Neg Hx    Stomach cancer Neg Hx    Rectal cancer Neg Hx    Colon polyps Neg Hx    Breast cancer Neg Hx    BRCA 1/2 Neg Hx     SOCIAL HISTORY: Social History   Socioeconomic History   Marital status: Married    Spouse name: Not on file   Number of children: 1   Years of education: 12   Highest education level: High school graduate  Occupational History   Occupation: Retired  Tobacco Use   Smoking status: Never   Smokeless tobacco: Never  Vaping Use   Vaping status: Never Used  Substance and Sexual Activity   Alcohol use: No   Drug use: No   Sexual activity: Yes  Other Topics Concern   Not on file  Social History Narrative   Not on file   Social Drivers of Health   Financial Resource Strain: Low Risk  (03/18/2024)   Overall Financial Resource Strain (CARDIA)    Difficulty of Paying Living Expenses: Not hard at all  Food Insecurity: No Food Insecurity (03/18/2024)   Hunger Vital Sign    Worried About Running Out of Food in the Last Year: Never true    Ran Out of Food in the Last Year: Never true  Transportation Needs: No Transportation Needs (03/18/2024)   PRAPARE - Administrator, Civil Service (Medical): No    Lack of Transportation (Non-Medical): No  Physical Activity: Insufficiently Active (03/18/2024)   Exercise Vital Sign    Days of Exercise per Week: 3 days    Minutes of Exercise per Session: 20 min  Stress: No Stress Concern Present (03/18/2024)   [employer] of Occupational Health - Occupational Stress Questionnaire    Feeling of Stress : Not at all  Social  Connections: Socially Integrated (03/18/2024)   Social Connection and Isolation Panel    Frequency of Communication with Friends and Family: More than three times a week    Frequency of Social Gatherings with Friends and Family: More than three times a week    Attends Religious Services: More than 4 times per year    Active Member of [employer] or Organizations: Yes    Attends Engineer, Structural: More than 4 times per year    Marital Status: Married    MEDICATIONS:  Current Outpatient Medications  Medication Sig Dispense  Refill   amLODipine  (NORVASC ) 10 MG tablet Take 1 tablet (10 mg total) by mouth daily. 90 tablet 0   aspirin  EC 81 MG tablet Take 1 tablet (81 mg total) by mouth daily. Swallow whole. 90 tablet 3   calcium  carbonate (OSCAL) 1500 (600 Ca) MG TABS tablet Take 1 tablet by mouth 2 (two) times daily with a meal.     cloNIDine  (CATAPRES ) 0.2 MG tablet Take 1 tablet (0.2 mg total) by mouth 2 (two) times daily. 180 tablet 1   Continuous Glucose Sensor (DEXCOM G7 SENSOR) MISC 1 Device by Does not apply route continuous. 9 each 3   dapagliflozin propanediol (FARXIGA) 10 MG TABS tablet Take by mouth daily.     ezetimibe  (ZETIA ) 10 MG tablet Take 1 tablet (10 mg total) by mouth daily. Take 1 tablet by mouth once daily 90 tablet 3   Ferrous Sulfate (IRON PO) Take by mouth.     glucose blood test strip Use as instructed to test blood sugar 3 times daily E11.65 270 each 1   insulin  glargine-yfgn (SEMGLEE , YFGN,) 100 UNIT/ML Pen INJECT 16 UNITS SUBCUTANEOUSLY ONCE DAILY 15 mL 4   losartan  (COZAAR ) 100 MG tablet Take 1 tablet (100 mg total) by mouth daily. 90 tablet 1   meloxicam  (MOBIC ) 7.5 MG tablet Take 1 tablet (7.5 mg total) by mouth daily. 30 tablet 0   metoprolol  tartrate (LOPRESSOR ) 50 MG tablet Take 1 tablet (50 mg total) by mouth 2 (two) times daily. (Patient taking differently: Take 25 mg by mouth 2 (two) times daily.) 180 tablet 1   pioglitazone  (ACTOS ) 15 MG tablet Take 1  tablet (15 mg total) by mouth daily. 30 tablet 5   rosuvastatin  (CRESTOR ) 20 MG tablet Take 1 tablet (20 mg total) by mouth daily. 90 tablet 1   Semaglutide ,0.25 or 0.5MG /DOS, 2 MG/3ML SOPN Inject 0.5 mg into the skin once a week. 9 mL 3   No current facility-administered medications for this visit.    PHYSICAL EXAM: Vitals:   08/28/24 0945 08/28/24 0946  BP: (!) 180/72 (!) 174/60  Pulse: 62   Resp: 20   SpO2: 99%   Weight: 89 lb (40.4 kg)   Height: 4' 9 (1.448 m)       Body mass index is 19.26 kg/m.  Wt Readings from Last 3 Encounters:  08/28/24 89 lb (40.4 kg)  06/24/24 95 lb 11.2 oz (43.4 kg)  05/28/24 91 lb 3.2 oz (41.4 kg)    General: Well developed, well nourished female in no apparent distress.  HEENT: AT/[place], no external lesions.  Eyes: Conjunctiva clear and no icterus. Neck: Neck supple  Lungs: Respirations not labored Neurologic: Alert, oriented, normal speech Extremities / Skin: Dry.  Psychiatric: Does not appear depressed or anxious  Diabetic Foot Exam - Simple   No data filed    LABS Reviewed Lab Results  Component Value Date   HGBA1C 7.4 (A) 06/24/2024   HGBA1C 7.5 (A) 05/28/2024   HGBA1C 9.3 (A) 02/11/2024   Lab Results  Component Value Date   FRUCTOSAMINE 353 (H) 07/29/2023   FRUCTOSAMINE 346 (H) 05/04/2022   FRUCTOSAMINE 337 (H) 01/23/2022   Lab Results  Component Value Date   CHOL 146 03/18/2024   HDL 59.80 03/18/2024   LDLCALC 65 03/18/2024   LDLDIRECT 33.0 01/23/2022   TRIG 108.0 03/18/2024   CHOLHDL 2 03/18/2024   Lab Results  Component Value Date   MICRALBCREAT 1,695 (H) 05/28/2024    Lab Results  Component Value  Date   CREATININE 2.3 (A) 01/01/2024   Lab Results  Component Value Date   GFR 36.27 (L) 07/29/2023    ASSESSMENT / PLAN  1. Type 2 diabetes mellitus with other specified complication, with long-term current use of insulin  (HCC)   2. Uncontrolled type 2 diabetes mellitus with hyperglycemia (HCC)     Diabetes Mellitus type 2, complicated by diabetic neuropathy and nephropathy. - Diabetic status / severity: Uncontrolled.  Improving.  Lab Results  Component Value Date   HGBA1C 7.4 (A) 06/24/2024    - Hemoglobin A1c goal : <7.0%  Diabetes control is improving.  Lately significant hyperglycemia mainly with breakfast.  Advised patient to limit portion of the breakfast.  Will not change the diabetes regimen/insulin  regimen.  Discussed about portion control and limiting carbohydrate in the meals.  Discussed about avoiding frequently snack and bedtime snack.  Discussed about taking insulin  before eating.  Discussed about compliance with NovoLog  with all the meals.  Will give fixed dose of NovoLog  for compliance and avoid confusion.  - Medications: See below.  No change.  Continue Semeglee long acting 16 units daily. Continue NovoLog  from 10 units with breakfast and stay on 10 units with lunch and 10 units with dinner. Ozempic  0.5 mg weekly. Farxiga 10 mg daily.  Actos  15 mg daily.   - Home glucose testing: CGM Dexcom and check as needed. - Discussed/ Gave Hypoglycemia treatment plan.  # Consult : not required at this time.   # Annual urine for microalbuminuria/ creatinine ratio, no microalbuminuria currently, continue ACE/ARB/losartan .  She has CKD IV following with nephrology.  Currently on Farxiga and losartan . Last  Lab Results  Component Value Date   MICRALBCREAT 1,695 (H) 05/28/2024    # Foot check nightly / neuropathy, used to take gabapentin .  Currently not taking.  # Annual dilated diabetic eye exams.   - Diet: Make healthy diabetic food choices - Life style / activity / exercise: Discussed.  2. Blood pressure  -  BP Readings from Last 1 Encounters:  08/28/24 (!) 174/60    - Control is not  in target. Patient denies headache or chest pain, asymptomatic.  Patient reports blood pressure is normal at home.  Patient advised to monitor blood pressure at home and  if remains high asked to call primary care provider.  Has not taken blood pressure medicine this morning. - No change in current plans.    3. Lipid status / Hyperlipidemia - Last  Lab Results  Component Value Date   LDLCALC 65 03/18/2024   - Continue rosuvastatin  20 mg daily.  Zetia  10 mg daily.  Diagnoses and all orders for this visit:  Type 2 diabetes mellitus with other specified complication, with long-term current use of insulin  (HCC)  Uncontrolled type 2 diabetes mellitus with hyperglycemia (HCC) -     Semaglutide ,0.25 or 0.5MG /DOS, 2 MG/3ML SOPN; Inject 0.5 mg into the skin once a week.    DISPOSITION Follow up in clinic in 3 months suggested.    All questions answered and patient verbalized understanding of the plan.  Socrates Cahoon, MD [facility] Endocrinology [facility] [address] [place], [address] [place], [place] [address] Phone # [phone]  At least part of this note was generated using voice recognition software. Inadvertent word errors may have occurred, which were not recognized during the proofreading process.

## 2024-08-31 ENCOUNTER — Ambulatory Visit: Admitting: Endocrinology

## 2024-09-28 LAB — IRON,TIBC AND FERRITIN PANEL: Iron: 63

## 2024-09-28 LAB — CBC AND DIFFERENTIAL: Hemoglobin: 10.8 — AB (ref 12.0–16.0)

## 2024-09-28 LAB — BASIC METABOLIC PANEL WITH GFR
BUN: 43 — AB (ref 4–21)
Creatinine: 2 — AB (ref 0.5–1.1)
Potassium: 5 meq/L (ref 3.5–5.1)

## 2024-09-28 LAB — COMPREHENSIVE METABOLIC PANEL WITH GFR: eGFR: 26

## 2024-09-30 ENCOUNTER — Other Ambulatory Visit

## 2024-09-30 DIAGNOSIS — I10 Essential (primary) hypertension: Secondary | ICD-10-CM

## 2024-09-30 NOTE — Progress Notes (Signed)
 09/30/2024 Name: Mariah Peterson MRN: [id number] DOB: 01-31-1946  Chief Complaint  Patient presents with   Medication Management   Hypertension    Mariah Peterson is a 78 y.o. year old female who presented for a telephone visit.   They were referred to the pharmacist by a quality report for assistance in managing diabetes.    Subjective:  Care Team: Primary Care Provider: Theophilus Andrews, Tully GRADE, MD   Medication Access/Adherence  Current Pharmacy:  [facility] [address] [place] - [address] [place] [address] Phone: [phone] Fax: [phone]  [facility] - [place], [place] - [address] [place] [address] Phone: [phone] Fax: [phone]   Patient reports affordability concerns with their medications: No  Patient reports access/transportation concerns to their pharmacy: No  Patient reports adherence concerns with their medications:  No     Diabetes:  Current medications: Farxiga 10mg  daily, Ozempic  0.5mg  on Thursdays, pioglitazone  15mg  daily, Semglee  16 units daily, Novolog  10 units with meals Medications tried in the past: Jardiance  (cost), Metformin  (kidney)  Current glucose readings:  Using Dexcom G7 sensors, unable to pair with office Is monitored by Dr. Mercie at endo Denies any low BG readings below 70. Reporting controlled readings at home  Observed patterns:  Patient denies hypoglycemic s/sx including dizziness, shakiness, sweating. Patient denies hyperglycemic symptoms including polyuria, polydipsia, polyphagia, nocturia, neuropathy, blurred vision.   Current medication access support: Farxiga through AZ&Me, Ozempic  through NOVO  Hypertension:  Current medications: Losartan  100mg , Amlodipine  5mg  daily, Metoprolol  50mg  BID, Clonidine  0.2mg  BID   Patient does have a validated, automated, upper arm home BP cuff Current blood pressure  readings readings:has not been checking  Reports adherence to meds  Patient denies hypotensive s/sx including dizziness, lightheadedness.  Patient denies hypertensive symptoms including headache, chest pain, shortness of breath  Hyperlipidemia/ASCVD Risk Reduction  Current lipid lowering medications: Crestor  20mg , Zetia  10mg   Medications tried in the past: None  Antiplatelet regimen: Aspirin  81mg    Objective:  Lab Results  Component Value Date   HGBA1C 7.4 (A) 06/24/2024    Lab Results  Component Value Date   CREATININE 2.3 (A) 01/01/2024   BUN 33 (A) 01/01/2024   NA 136 (A) 01/01/2024   K 5.0 01/01/2024   CL 108 01/01/2024   CO2 21 01/01/2024    Lab Results  Component Value Date   CHOL 146 03/18/2024   HDL 59.80 03/18/2024   LDLCALC 65 03/18/2024   LDLDIRECT 33.0 01/23/2022   TRIG 108.0 03/18/2024   CHOLHDL 2 03/18/2024    Medications Reviewed Today     Reviewed by Lionell Jon DEL, RPH (Pharmacist) on 09/30/24 at 1149  Med List Status: <None>   Medication Order Taking? Sig Documenting Provider Last Dose Status Informant  amLODipine  (NORVASC ) 10 MG tablet [id number]  Take 1 tablet (10 mg total) by mouth daily. Von Pacific, MD  Active   aspirin  EC 81 MG tablet [id number]  Take 1 tablet (81 mg total) by mouth daily. Swallow whole. Jeffrie Oneil BROCKS, MD  Active   calcium  carbonate (OSCAL) 1500 (600 Ca) MG TABS tablet [id number]  Take 1 tablet by mouth 2 (two) times daily with a meal. [provider]  Active Self  cloNIDine  (CATAPRES ) 0.2 MG tablet [id number]  Take 1 tablet (0.2 mg total) by mouth 2 (two) times daily. Theophilus Andrews, Tully GRADE, MD  Active   Continuous Glucose Sensor [facility] G7  SENSOR) MISC [id number]  1 Device by Does not apply route continuous. Thapa, Sudan, MD  Active   dapagliflozin propanediol (FARXIGA) 10 MG TABS tablet [id number]  Take by mouth daily. [provider]  Active   ezetimibe  (ZETIA ) 10 MG tablet [id number]  Take 1 tablet  (10 mg total) by mouth daily. Take 1 tablet by mouth once daily Thapa, Sudan, MD  Active   Ferrous Sulfate (IRON PO) [id number]  Take by mouth. [provider]  Active   glucose blood test strip [id number]  Use as instructed to test blood sugar 3 times daily E11.65 Von Pacific, MD  Active Self           Med Note (BRIDGES, JACQUELINE L   Wed Sep 25, 2023  9:25 AM)    insulin  glargine-yfgn (SEMGLEE , YFGN,) 100 UNIT/ML Pen [id number]  INJECT 16 UNITS SUBCUTANEOUSLY ONCE DAILY Thapa, Sudan, MD  Active   losartan  (COZAAR ) 100 MG tablet [id number]  Take 1 tablet (100 mg total) by mouth daily. Theophilus Andrews, Tully GRADE, MD  Active   meloxicam  (MOBIC ) 7.5 MG tablet [id number]  Take 1 tablet (7.5 mg total) by mouth daily. Theophilus Andrews, Tully GRADE, MD  Active   metoprolol  tartrate (LOPRESSOR ) 50 MG tablet [id number]  Take 1 tablet (50 mg total) by mouth 2 (two) times daily.  Patient taking differently: Take 25 mg by mouth 2 (two) times daily.   Theophilus Andrews, Tully GRADE, MD  Active   pioglitazone  (ACTOS ) 15 MG tablet [id number]  Take 1 tablet (15 mg total) by mouth daily. Thapa, Sudan, MD  Active   rosuvastatin  (CRESTOR ) 20 MG tablet [id number]  Take 1 tablet (20 mg total) by mouth daily. Thapa, Sudan, MD  Active   Semaglutide ,0.25 or 0.5MG /DOS, 2 MG/3ML SOPN [id number]  Inject 0.5 mg into the skin once a week. Thapa, Sudan, MD  Active               Assessment/Plan:   Diabetes: - Currently uncontrolled but improving - Reviewed long term cardiovascular and renal outcomes of uncontrolled blood sugar - Reviewed goal A1c, goal fasting, and goal 2 hour post prandial glucose - Reviewed dietary modifications including low carb diet - Recommend to continue current medication therapy and keep follow up with endo. If A1C still elevated at next follow up, will likely need insulin  adjustment  - Patient denies personal or family history of multiple endocrine neoplasia type 2, medullary thyroid  cancer;  personal history of pancreatitis or gallbladder disease   Hypertension: - Currently uncontrolled - Reviewed long term cardiovascular and renal outcomes of uncontrolled blood pressure - Reviewed appropriate blood pressure monitoring technique and reviewed goal blood pressure. Recommended to check home blood pressure and heart rate daily and KEEP A LOG -Continue current med therapy   Hyperlipidemia/ASCVD Risk Reduction: - Currently controlled.  - Reviewed long term complications of uncontrolled cholesterol - Recommend to continue current medication therapy    Follow Up Plan: 3 weeks Jon VEAR Lindau, PharmD Clinical Pharmacist [phone]

## 2024-10-07 ENCOUNTER — Encounter: Payer: Self-pay | Admitting: Internal Medicine

## 2024-10-19 ENCOUNTER — Other Ambulatory Visit (INDEPENDENT_AMBULATORY_CARE_PROVIDER_SITE_OTHER)

## 2024-10-19 VITALS — BP 125/78

## 2024-10-19 DIAGNOSIS — I10 Essential (primary) hypertension: Secondary | ICD-10-CM

## 2024-10-19 NOTE — Progress Notes (Signed)
 "  10/19/2024 Name: Mariah Peterson MRN: [id number] DOB: [birth year]  Chief Complaint  Patient presents with   Medication Management   Hypertension    Mariah Peterson is a 78 y.o. year old female who presented for a telephone visit.   They were referred to the pharmacist by a quality report for assistance in managing diabetes.    Subjective:  Care Team: Primary Care Provider: Theophilus Andrews, Tully GRADE, MD   Medication Access/Adherence  Current Pharmacy:  [facility] [address] [place] - [address] [place] [address] Phone: [phone] Fax: [phone]  [facility] - [place], [place] - [address] [place] [address] Phone: [phone] Fax: [phone]   Patient reports affordability concerns with their medications: No  Patient reports access/transportation concerns to their pharmacy: No  Patient reports adherence concerns with their medications:  No     Diabetes:  Current medications: Farxiga 10mg  daily, Ozempic  0.5mg  on Thursdays, pioglitazone  15mg  daily, Semglee  16 units daily, Novolog  10 units with meals Medications tried in the past: Jardiance  (cost), Metformin  (kidney)  Current glucose readings:  Using Dexcom G7 sensors, unable to pair with office Is monitored by Dr. Mercie at endo Denies any low BG readings below 70. Reporting controlled readings at home  Observed patterns:  Patient denies hypoglycemic s/sx including dizziness, shakiness, sweating. Patient denies hyperglycemic symptoms including polyuria, polydipsia, polyphagia, nocturia, neuropathy, blurred vision.   Current medication access support: Farxiga through AZ&Me, Ozempic  through NOVO  Hypertension:  Current medications: Losartan  100mg , Amlodipine  5mg  daily, Metoprolol  50mg  BID, Clonidine  0.2mg  BID   Patient does have a validated, automated, upper arm home BP cuff Current blood pressure  readings readings: checking daily, some readings are elevated at times but improving since clonidine  dose increase and reporting mostly controlled readings. 125/78 at last check  Reports adherence to meds  Patient denies hypotensive s/sx including dizziness, lightheadedness.  Patient denies hypertensive symptoms including headache, chest pain, shortness of breath  Hyperlipidemia/ASCVD Risk Reduction  Current lipid lowering medications: Crestor  20mg , Zetia  10mg   Medications tried in the past: None  Antiplatelet regimen: Aspirin  81mg    Objective:  Lab Results  Component Value Date   HGBA1C 7.4 (A) 06/24/2024    Lab Results  Component Value Date   CREATININE 2.0 (A) 09/28/2024   BUN 43 (A) 09/28/2024   NA 136 (A) 01/01/2024   K 5.0 09/28/2024   CL 108 01/01/2024   CO2 21 01/01/2024    Lab Results  Component Value Date   CHOL 146 03/18/2024   HDL 59.80 03/18/2024   LDLCALC 65 03/18/2024   LDLDIRECT 33.0 01/23/2022   TRIG 108.0 03/18/2024   CHOLHDL 2 03/18/2024    Medications Reviewed Today     Reviewed by Lionell Jon DEL, RPH (Pharmacist) on 10/19/24 at 1117  Med List Status: <None>   Medication Order Taking? Sig Documenting Provider Last Dose Status Informant  amLODipine  (NORVASC ) 10 MG tablet [id number]  Take 1 tablet (10 mg total) by mouth daily. Von Pacific, MD  Active   aspirin  EC 81 MG tablet [id number]  Take 1 tablet (81 mg total) by mouth daily. Swallow whole. Jeffrie Oneil BROCKS, MD  Active   calcium  carbonate (OSCAL) 1500 (600 Ca) MG TABS tablet [id number]  Take 1 tablet by mouth 2 (two) times daily with a meal. [provider]  Active Self  cloNIDine  (CATAPRES ) 0.2 MG tablet [id number]  Take 1 tablet (0.2 mg  total) by mouth 2 (two) times daily. Theophilus Andrews, Tully GRADE, MD  Active   Continuous Glucose Sensor (DEXCOM G7 SENSOR) [place] [id number]  1 Device by Does not apply route continuous. Thapa, Sudan, MD  Active   dapagliflozin propanediol (FARXIGA) 10 MG  TABS tablet [id number]  Take by mouth daily. [provider]  Active   ezetimibe  (ZETIA ) 10 MG tablet [id number]  Take 1 tablet (10 mg total) by mouth daily. Take 1 tablet by mouth once daily Thapa, Sudan, MD  Active   Ferrous Sulfate (IRON PO) [id number]  Take by mouth. [provider]  Active   glucose blood test strip [id number]  Use as instructed to test blood sugar 3 times daily E11.65 Von Pacific, MD  Active Self           Med Note (BRIDGES, JACQUELINE L   Wed Sep 25, 2023  9:25 AM)    insulin  glargine-yfgn (SEMGLEE , YFGN,) 100 UNIT/ML Pen [id number]  INJECT 16 UNITS SUBCUTANEOUSLY ONCE DAILY Thapa, Sudan, MD  Active   losartan  (COZAAR ) 100 MG tablet [id number]  Take 1 tablet (100 mg total) by mouth daily. Theophilus Andrews, Tully GRADE, MD  Active   meloxicam  (MOBIC ) 7.5 MG tablet [id number]  Take 1 tablet (7.5 mg total) by mouth daily. Theophilus Andrews, Tully GRADE, MD  Active   metoprolol  tartrate (LOPRESSOR ) 50 MG tablet [id number]  Take 1 tablet (50 mg total) by mouth 2 (two) times daily.  Patient taking differently: Take 25 mg by mouth 2 (two) times daily.   Theophilus Andrews, Tully GRADE, MD  Active   pioglitazone  (ACTOS ) 15 MG tablet [id number]  Take 1 tablet (15 mg total) by mouth daily. Thapa, Sudan, MD  Active   rosuvastatin  (CRESTOR ) 20 MG tablet [id number]  Take 1 tablet (20 mg total) by mouth daily. Thapa, Sudan, MD  Active   Semaglutide ,0.25 or 0.5MG /DOS, 2 MG/3ML SOPN [id number]  Inject 0.5 mg into the skin once a week. Thapa, Sudan, MD  Active               Assessment/Plan:   Diabetes: - Currently uncontrolled but improving - Reviewed long term cardiovascular and renal outcomes of uncontrolled blood sugar - Reviewed goal A1c, goal fasting, and goal 2 hour post prandial glucose - Reviewed dietary modifications including low carb diet - Recommend to continue current medication therapy and keep follow up with endo. If A1C still elevated at next follow up, will likely  need insulin  adjustment  - Patient denies personal or family history of multiple endocrine neoplasia type 2, medullary thyroid  cancer; personal history of pancreatitis or gallbladder disease   Hypertension: - Currently controlled per home readings - Reviewed long term cardiovascular and renal outcomes of uncontrolled blood pressure - Reviewed appropriate blood pressure monitoring technique and reviewed goal blood pressure. Recommended to check home blood pressure and heart rate daily and KEEP A LOG -Continue current med therapy   Hyperlipidemia/ASCVD Risk Reduction: - Currently controlled.  - Reviewed long term complications of uncontrolled cholesterol - Recommend to continue current medication therapy    Follow Up Plan: 3 months  Jon VEAR Lindau, PharmD Clinical Pharmacist [phone]  "

## 2024-10-28 ENCOUNTER — Ambulatory Visit: Admission: EM | Admit: 2024-10-28 | Discharge: 2024-10-28 | Disposition: A | Discharge status: Home / Self Care

## 2024-10-28 ENCOUNTER — Encounter: Payer: Self-pay | Admitting: *Deleted

## 2024-10-28 DIAGNOSIS — E119 Type 2 diabetes mellitus without complications: Secondary | ICD-10-CM | POA: Insufficient documentation

## 2024-10-28 DIAGNOSIS — N3001 Acute cystitis with hematuria: Secondary | ICD-10-CM | POA: Insufficient documentation

## 2024-10-28 DIAGNOSIS — R21 Rash and other nonspecific skin eruption: Secondary | ICD-10-CM | POA: Diagnosis not present

## 2024-10-28 DIAGNOSIS — R300 Dysuria: Secondary | ICD-10-CM | POA: Insufficient documentation

## 2024-10-28 DIAGNOSIS — Z7984 Long term (current) use of oral hypoglycemic drugs: Secondary | ICD-10-CM | POA: Diagnosis not present

## 2024-10-28 DIAGNOSIS — I10 Essential (primary) hypertension: Secondary | ICD-10-CM | POA: Diagnosis not present

## 2024-10-28 DIAGNOSIS — R350 Frequency of micturition: Secondary | ICD-10-CM | POA: Diagnosis present

## 2024-10-28 DIAGNOSIS — R102 Pelvic and perineal pain unspecified side: Secondary | ICD-10-CM | POA: Diagnosis not present

## 2024-10-28 DIAGNOSIS — Z794 Long term (current) use of insulin: Secondary | ICD-10-CM | POA: Diagnosis not present

## 2024-10-28 DIAGNOSIS — Z113 Encounter for screening for infections with a predominantly sexual mode of transmission: Secondary | ICD-10-CM | POA: Diagnosis present

## 2024-10-28 LAB — POCT URINE DIPSTICK
Bilirubin, UA: NEGATIVE
Glucose, UA: 500 mg/dL — AB
Ketones, POC UA: NEGATIVE mg/dL
Nitrite, UA: NEGATIVE
POC PROTEIN,UA: >=300 — AB
Spec Grav, UA: 1.025
Urobilinogen, UA: 0.2 U/dL
pH, UA: 6

## 2024-10-28 LAB — GLUCOSE, POCT (MANUAL RESULT ENTRY): POC Glucose: 296 mg/dL — AB (ref 70–99)

## 2024-10-28 MED ORDER — PHENAZOPYRIDINE HCL 200 MG PO TABS: 200.0000 mg | ORAL_TABLET | Freq: Three times a day (TID) | ORAL | 0 refills | Status: AC

## 2024-10-28 MED ORDER — CEFUROXIME AXETIL 250 MG PO TABS: 250.0000 mg | ORAL_TABLET | Freq: Two times a day (BID) | ORAL | 0 refills | Status: AC

## 2024-10-28 NOTE — ED Triage Notes (Signed)
 Pt reports 1-2 weeks of dysuria and frequency. Tried vagisil without relief. States it seems like I have rash or something. Denies vaginal discharge.

## 2024-10-28 NOTE — Discharge Instructions (Addendum)
" °  1. Acute cystitis with hematuria (Primary) - POCT URINE DIPSTICK completed in UC shows trace leukocytes, small blood, negative nitrite, greater than 500 mg/dL glucose.  These findings are not strongly indicative of urinary infection but could be secondary to elevated urine glucose.  Empiric treatment with antibiotic therapy initiated until urine culture is complete. - POCT CBG completed in UC is 296 mg/dL.  CBG completed due to greater than 500 glucose and urine - Urine Culture collected in UC and sent to lab for further testing results should be available in 2 to 3 days. - Cervicovaginal swab collected in UC and sent to lab for further testing, looking for possible yeast or BV as cause of symptoms.  Results should be available in 2 to 3 days. - cefUROXime  (CEFTIN ) 250 MG tablet; Take 1 tablet (250 mg total) by mouth 2 (two) times daily with a meal for 7 days.  Dispense: 14 tablet; Refill: 0 - phenazopyridine  (PYRIDIUM ) 200 MG tablet; Take 1 tablet (200 mg total) by mouth 3 (three) times daily.  Dispense: 6 tablet; Refill: 0  -Continue to monitor symptoms for any change in severity if there is any escalation of current symptoms or development of new symptoms follow-up in ER for further evaluation and management. "

## 2024-10-28 NOTE — ED Provider Notes (Signed)
 " [facility]  Note:  This document was prepared using Dragon voice recognition software and may include unintentional dictation errors.  MRN: [id number] DOB: 1946/03/15  Subjective:   Mariah Peterson is a 79 y.o. female presenting for evaluation of increased urinary frequency and dysuria x 1 to 2 weeks.  Patient also reports some mild rash and labial irritation without known cause.  Patient has tried using vaginocele for vaginal irritation and rash.  Has not taken any Azo or other medication to treat urinary symptoms.  Patient denies any current vaginal discharge, abdominal pain, flank pain, vaginal lesions.  Patient reports that she knows that she is at increased risk for urinary and vaginal infections due to being diabetic.  Patient states that her blood glucose was 160 last night with her Dexcom monitor but does not have a Dexcom device with her to check sugar here.  Patient also has elevated blood pressure reading in urgent care due to not taking blood pressure medication prior to coming to urgent care for evaluation.  Denies any dizziness, blurred vision, headache, chest pain, palpitations, shortness of breath.  Current Medications[1]   Allergies[2]  Past Medical History:  Diagnosis Date   Chest pain 11/30/2020   Coronary artery disease    Diabetes mellitus without complication (HCC)    Glaucoma    History of chicken pox    Hypertension      Past Surgical History:  Procedure Laterality Date   BREAST BIOPSY Right 2017   benign   CARDIAC CATHETERIZATION     CATARACT EXTRACTION, BILATERAL Bilateral    CESAREAN SECTION     COLONOSCOPY  1980's   CORONARY ARTERY BYPASS GRAFT N/A 12/05/2020   Procedure: CORONARY ARTERY BYPASS GRAFTING (CABG), ON PUMP, TIMES FOUR, USING LEFT INTERNAL MAMMARY ARTERY AND BILATERAL ENDOSCOPICALLY HARVESTED GREATER SAPHENOUS VEINS;  Surgeon: Shyrl Linnie KIDD, MD;  Location: [facility] OR;  Service: Open Heart Surgery;  Laterality: N/A;   LEFT HEART CATH  AND CORONARY ANGIOGRAPHY N/A 11/30/2020   Procedure: LEFT HEART CATH AND CORONARY ANGIOGRAPHY;  Surgeon: Claudene Victory ORN, MD;  Location: [facility] INVASIVE CV LAB;  Service: Cardiovascular;  Laterality: N/A;   TEE WITHOUT CARDIOVERSION N/A 12/05/2020   Procedure: TRANSESOPHAGEAL ECHOCARDIOGRAM (TEE);  Surgeon: Shyrl Linnie KIDD, MD;  Location: [facility] OR;  Service: Open Heart Surgery;  Laterality: N/A;   TUBAL LIGATION      Family History  Problem Relation Age of Onset   Hypertension Mother    Diabetes Brother    CAD Brother    Hypertension Brother    Diabetes Brother    Hypertension Brother    Colon cancer Neg Hx    Esophageal cancer Neg Hx    Pancreatic cancer Neg Hx    Liver disease Neg Hx    Stomach cancer Neg Hx    Rectal cancer Neg Hx    Colon polyps Neg Hx    Breast cancer Neg Hx    BRCA 1/2 Neg Hx     Social History[3]  ROS Refer to HPI for ROS details.  Objective:   Vitals: BP (!) 215/81 (BP Location: Left Arm) Comment: States she has not taken her medication yet today  Pulse 89   Temp 99.8 F (37.7 C) (Tympanic)   Resp 16   SpO2 98%   Physical Exam Vitals and nursing note reviewed.  Constitutional:      General: She is not in acute distress.    Appearance: Normal appearance. She is well-developed. She is not  ill-appearing or toxic-appearing.  HENT:     Head: Normocephalic and atraumatic.  Cardiovascular:     Rate and Rhythm: Normal rate.  Pulmonary:     Effort: Pulmonary effort is normal. No respiratory distress.     Breath sounds: No stridor. No wheezing.  Abdominal:     General: There is no distension.     Palpations: Abdomen is soft.     Tenderness: There is no abdominal tenderness. There is no right CVA tenderness or left CVA tenderness.  Skin:    General: Skin is warm and dry.  Neurological:     General: No focal deficit present.     Mental Status: She is alert and oriented to person, place, and time.  Psychiatric:        Mood and Affect: Mood normal.         Behavior: Behavior normal.     Procedures  Results for orders placed or performed during the hospital encounter of 10/28/24 (from the past 24 hours)  POCT URINE DIPSTICK     Status: Abnormal   Collection Time: 10/28/24  8:44 AM  Result Value Ref Range   Color, UA yellow yellow   Clarity, UA cloudy (A) clear   Glucose, UA =500 (A) negative mg/dL   Bilirubin, UA negative negative   Ketones, POC UA negative negative mg/dL   Spec Grav, UA 8.974 8.989 - 1.025   Blood, UA small (A) negative   pH, UA 6.0 5.0 - 8.0   POC PROTEIN,UA >=300 (A) negative, trace   Urobilinogen, UA 0.2 0.2 or 1.0 E.U./dL   Nitrite, UA Negative Negative   Leukocytes, UA Trace (A) Negative  POCT CBG (manual entry)     Status: Abnormal   Collection Time: 10/28/24  8:57 AM  Result Value Ref Range   POC Glucose 296 (A) 70 - 99 mg/dl    No results found.   Assessment and Plan :     Discharge Instructions       1. Acute cystitis with hematuria (Primary) - POCT URINE DIPSTICK completed in UC shows trace leukocytes, small blood, negative nitrite, greater than 500 mg/dL glucose.  These findings are not strongly indicative of urinary infection but could be secondary to elevated urine glucose.  Empiric treatment with antibiotic therapy initiated until urine culture is complete. - POCT CBG completed in UC is 296 mg/dL.  CBG completed due to greater than 500 glucose and urine - Urine Culture collected in UC and sent to lab for further testing results should be available in 2 to 3 days. - Cervicovaginal swab collected in UC and sent to lab for further testing, looking for possible yeast or BV as cause of symptoms.  Results should be available in 2 to 3 days. - cefUROXime  (CEFTIN ) 250 MG tablet; Take 1 tablet (250 mg total) by mouth 2 (two) times daily with a meal for 7 days.  Dispense: 14 tablet; Refill: 0 - phenazopyridine  (PYRIDIUM ) 200 MG tablet; Take 1 tablet (200 mg total) by mouth 3 (three) times daily.   Dispense: 6 tablet; Refill: 0  -Continue to monitor symptoms for any change in severity if there is any escalation of current symptoms or development of new symptoms follow-up in ER for further evaluation and management.       Melane Windholz B Diana Armijo    [1] No current facility-administered medications for this encounter.  Current Outpatient Medications:    amLODipine  (NORVASC ) 10 MG tablet, Take 1 tablet (10 mg total) by mouth daily.,  Disp: 90 tablet, Rfl: 0   aspirin  EC 81 MG tablet, Take 1 tablet (81 mg total) by mouth daily. Swallow whole., Disp: 90 tablet, Rfl: 3   calcium  carbonate (OSCAL) 1500 (600 Ca) MG TABS tablet, Take 1 tablet by mouth 2 (two) times daily with a meal., Disp: , Rfl:    cefUROXime  (CEFTIN ) 250 MG tablet, Take 1 tablet (250 mg total) by mouth 2 (two) times daily with a meal for 7 days., Disp: 14 tablet, Rfl: 0   cloNIDine  (CATAPRES ) 0.2 MG tablet, Take 1 tablet (0.2 mg total) by mouth 2 (two) times daily., Disp: 180 tablet, Rfl: 1   dapagliflozin propanediol (FARXIGA) 10 MG TABS tablet, Take by mouth daily., Disp: , Rfl:    ezetimibe  (ZETIA ) 10 MG tablet, Take 1 tablet (10 mg total) by mouth daily. Take 1 tablet by mouth once daily, Disp: 90 tablet, Rfl: 3   Ferrous Sulfate (IRON PO), Take by mouth., Disp: , Rfl:    LANTUS  SOLOSTAR 100 UNIT/ML Solostar Pen, SMARTSIG:16 Unit(s) SUB-Q Daily, Disp: , Rfl:    losartan  (COZAAR ) 100 MG tablet, Take 1 tablet (100 mg total) by mouth daily., Disp: 90 tablet, Rfl: 1   metoprolol  tartrate (LOPRESSOR ) 50 MG tablet, Take 1 tablet (50 mg total) by mouth 2 (two) times daily. (Patient taking differently: Take 25 mg by mouth 2 (two) times daily.), Disp: 180 tablet, Rfl: 1   phenazopyridine  (PYRIDIUM ) 200 MG tablet, Take 1 tablet (200 mg total) by mouth 3 (three) times daily., Disp: 6 tablet, Rfl: 0   pioglitazone  (ACTOS ) 15 MG tablet, Take 1 tablet (15 mg total) by mouth daily., Disp: 30 tablet, Rfl: 5   rosuvastatin  (CRESTOR ) 20 MG  tablet, Take 1 tablet (20 mg total) by mouth daily., Disp: 90 tablet, Rfl: 1   Semaglutide ,0.25 or 0.5MG /DOS, 2 MG/3ML SOPN, Inject 0.5 mg into the skin once a week., Disp: 9 mL, Rfl: 3   sodium bicarbonate  650 MG tablet, Take 650 mg by mouth 2 (two) times daily., Disp: , Rfl:    Continuous Glucose Sensor (DEXCOM G7 SENSOR) MISC, 1 Device by Does not apply route continuous., Disp: 9 each, Rfl: 3   glucose blood test strip, Use as instructed to test blood sugar 3 times daily E11.65, Disp: 270 each, Rfl: 1   insulin  glargine-yfgn (SEMGLEE , YFGN,) 100 UNIT/ML Pen, INJECT 16 UNITS SUBCUTANEOUSLY ONCE DAILY, Disp: 15 mL, Rfl: 4   meloxicam  (MOBIC ) 7.5 MG tablet, Take 1 tablet (7.5 mg total) by mouth daily. (Patient not taking: Reported on 10/28/2024), Disp: 30 tablet, Rfl: 0 [2] No Known Allergies [3]  Social History Tobacco Use   Smoking status: Never   Smokeless tobacco: Never  Vaping Use   Vaping status: Never Used  Substance Use Topics   Alcohol use: No   Drug use: No     Aurea Ethel NOVAK, NP 10/28/24 [phone]  "

## 2024-10-29 LAB — CERVICOVAGINAL ANCILLARY ONLY
Bacterial Vaginitis (gardnerella): NEGATIVE
Candida Glabrata: NEGATIVE
Candida Vaginitis: POSITIVE — AB
Comment: NEGATIVE
Comment: NEGATIVE
Comment: NEGATIVE

## 2024-10-30 ENCOUNTER — Ambulatory Visit (HOSPITAL_COMMUNITY): Payer: Self-pay

## 2024-10-30 LAB — URINE CULTURE
Culture: 10000 — AB
Special Requests: NORMAL

## 2024-10-30 MED ORDER — FLUCONAZOLE 150 MG PO TABS: 150.0000 mg | ORAL_TABLET | Freq: Once | ORAL | 0 refills | Status: AC

## 2024-12-02 ENCOUNTER — Ambulatory Visit: Admitting: Endocrinology

## 2024-12-22 ENCOUNTER — Ambulatory Visit: Admitting: Internal Medicine

## 2025-01-25 ENCOUNTER — Other Ambulatory Visit
